# Patient Record
Sex: Female | Born: 1960
Health system: Southern US, Community
[De-identification: ages and names within clinical notes are randomized; demographics above are authoritative.]

## PROBLEM LIST (undated history)

## (undated) DIAGNOSIS — F3181 Bipolar II disorder: Secondary | ICD-10-CM

## (undated) DIAGNOSIS — K219 Gastro-esophageal reflux disease without esophagitis: Secondary | ICD-10-CM

## (undated) DIAGNOSIS — K469 Unspecified abdominal hernia without obstruction or gangrene: Secondary | ICD-10-CM

## (undated) DIAGNOSIS — I10 Essential (primary) hypertension: Secondary | ICD-10-CM

## (undated) DIAGNOSIS — T7840XA Allergy, unspecified, initial encounter: Secondary | ICD-10-CM

## (undated) DIAGNOSIS — E119 Type 2 diabetes mellitus without complications: Secondary | ICD-10-CM

## (undated) DIAGNOSIS — H409 Unspecified glaucoma: Secondary | ICD-10-CM

## (undated) DIAGNOSIS — D649 Anemia, unspecified: Secondary | ICD-10-CM

## (undated) HISTORY — DX: Unspecified glaucoma: H40.9

## (undated) HISTORY — PX: TUBAL LIGATION: SHX77

## (undated) HISTORY — DX: Anemia, unspecified: D64.9

## (undated) HISTORY — PX: OTHER SURGICAL HISTORY: SHX169

## (undated) HISTORY — DX: Type 2 diabetes mellitus without complications: E11.9

## (undated) HISTORY — PX: HERNIA REPAIR: SHX51

## (undated) HISTORY — DX: Gastro-esophageal reflux disease without esophagitis: K21.9

## (undated) HISTORY — PX: TONSILLECTOMY: SUR1361

## (undated) HISTORY — DX: Allergy, unspecified, initial encounter: T78.40XA

---

## 1998-01-14 ENCOUNTER — Ambulatory Visit (HOSPITAL_COMMUNITY): Admission: RE | Admit: 1998-01-14 | Discharge: 1998-01-14 | Payer: Self-pay | Admitting: Obstetrics and Gynecology

## 1998-01-14 ENCOUNTER — Encounter: Payer: Self-pay | Admitting: Obstetrics and Gynecology

## 1998-03-17 ENCOUNTER — Inpatient Hospital Stay (HOSPITAL_COMMUNITY): Admission: AD | Admit: 1998-03-17 | Discharge: 1998-03-17 | Payer: Self-pay | Admitting: Obstetrics and Gynecology

## 1998-03-19 ENCOUNTER — Inpatient Hospital Stay (HOSPITAL_COMMUNITY): Admission: AD | Admit: 1998-03-19 | Discharge: 1998-03-19 | Payer: Self-pay | Admitting: Obstetrics and Gynecology

## 1998-04-04 ENCOUNTER — Inpatient Hospital Stay (HOSPITAL_COMMUNITY): Admission: AD | Admit: 1998-04-04 | Discharge: 1998-04-06 | Payer: Self-pay | Admitting: Obstetrics and Gynecology

## 1998-09-18 ENCOUNTER — Ambulatory Visit (HOSPITAL_COMMUNITY): Admission: RE | Admit: 1998-09-18 | Discharge: 1998-09-18 | Payer: Self-pay | Admitting: Family Medicine

## 1998-09-18 ENCOUNTER — Encounter: Payer: Self-pay | Admitting: Family Medicine

## 1998-12-11 ENCOUNTER — Inpatient Hospital Stay (HOSPITAL_COMMUNITY): Admission: RE | Admit: 1998-12-11 | Discharge: 1998-12-14 | Payer: Self-pay | Admitting: Specialist

## 2000-11-07 ENCOUNTER — Encounter: Payer: Self-pay | Admitting: Emergency Medicine

## 2000-11-07 ENCOUNTER — Emergency Department (HOSPITAL_COMMUNITY): Admission: EM | Admit: 2000-11-07 | Discharge: 2000-11-07 | Payer: Self-pay | Admitting: Emergency Medicine

## 2003-07-04 ENCOUNTER — Emergency Department (HOSPITAL_COMMUNITY): Admission: EM | Admit: 2003-07-04 | Discharge: 2003-07-04 | Payer: Self-pay

## 2003-12-02 ENCOUNTER — Emergency Department (HOSPITAL_COMMUNITY): Admission: EM | Admit: 2003-12-02 | Discharge: 2003-12-02 | Payer: Self-pay | Admitting: Emergency Medicine

## 2003-12-03 ENCOUNTER — Ambulatory Visit: Payer: Self-pay | Admitting: Nurse Practitioner

## 2003-12-15 ENCOUNTER — Ambulatory Visit: Payer: Self-pay | Admitting: Nurse Practitioner

## 2003-12-30 ENCOUNTER — Ambulatory Visit: Payer: Self-pay | Admitting: *Deleted

## 2004-01-08 ENCOUNTER — Ambulatory Visit: Payer: Self-pay | Admitting: Internal Medicine

## 2004-08-24 ENCOUNTER — Ambulatory Visit: Payer: Self-pay | Admitting: Internal Medicine

## 2005-12-22 ENCOUNTER — Ambulatory Visit: Payer: Self-pay | Admitting: Internal Medicine

## 2006-01-06 ENCOUNTER — Encounter (INDEPENDENT_AMBULATORY_CARE_PROVIDER_SITE_OTHER): Payer: Self-pay | Admitting: Specialist

## 2006-01-06 ENCOUNTER — Ambulatory Visit: Payer: Self-pay | Admitting: Internal Medicine

## 2006-01-06 DIAGNOSIS — D509 Iron deficiency anemia, unspecified: Secondary | ICD-10-CM | POA: Insufficient documentation

## 2006-02-07 ENCOUNTER — Ambulatory Visit: Payer: Self-pay | Admitting: Internal Medicine

## 2006-05-05 ENCOUNTER — Ambulatory Visit: Payer: Self-pay | Admitting: Internal Medicine

## 2006-11-09 ENCOUNTER — Telehealth (INDEPENDENT_AMBULATORY_CARE_PROVIDER_SITE_OTHER): Payer: Self-pay | Admitting: Internal Medicine

## 2006-11-22 ENCOUNTER — Encounter (INDEPENDENT_AMBULATORY_CARE_PROVIDER_SITE_OTHER): Payer: Self-pay | Admitting: *Deleted

## 2006-12-07 ENCOUNTER — Ambulatory Visit: Payer: Self-pay | Admitting: Internal Medicine

## 2006-12-07 DIAGNOSIS — J3089 Other allergic rhinitis: Secondary | ICD-10-CM | POA: Insufficient documentation

## 2006-12-07 DIAGNOSIS — M1711 Unilateral primary osteoarthritis, right knee: Secondary | ICD-10-CM | POA: Insufficient documentation

## 2006-12-07 DIAGNOSIS — E7800 Pure hypercholesterolemia, unspecified: Secondary | ICD-10-CM | POA: Insufficient documentation

## 2006-12-07 DIAGNOSIS — I10 Essential (primary) hypertension: Secondary | ICD-10-CM | POA: Insufficient documentation

## 2006-12-08 ENCOUNTER — Ambulatory Visit (HOSPITAL_COMMUNITY): Admission: RE | Admit: 2006-12-08 | Discharge: 2006-12-08 | Payer: Self-pay | Admitting: Internal Medicine

## 2006-12-14 ENCOUNTER — Telehealth (INDEPENDENT_AMBULATORY_CARE_PROVIDER_SITE_OTHER): Payer: Self-pay | Admitting: Internal Medicine

## 2006-12-19 ENCOUNTER — Telehealth (INDEPENDENT_AMBULATORY_CARE_PROVIDER_SITE_OTHER): Payer: Self-pay | Admitting: Internal Medicine

## 2006-12-20 ENCOUNTER — Telehealth (INDEPENDENT_AMBULATORY_CARE_PROVIDER_SITE_OTHER): Payer: Self-pay | Admitting: Internal Medicine

## 2007-01-03 ENCOUNTER — Telehealth (INDEPENDENT_AMBULATORY_CARE_PROVIDER_SITE_OTHER): Payer: Self-pay | Admitting: Internal Medicine

## 2007-01-11 ENCOUNTER — Telehealth (INDEPENDENT_AMBULATORY_CARE_PROVIDER_SITE_OTHER): Payer: Self-pay | Admitting: Internal Medicine

## 2007-01-12 ENCOUNTER — Encounter (INDEPENDENT_AMBULATORY_CARE_PROVIDER_SITE_OTHER): Payer: Self-pay | Admitting: Internal Medicine

## 2007-01-30 ENCOUNTER — Ambulatory Visit: Payer: Self-pay | Admitting: Internal Medicine

## 2007-01-30 ENCOUNTER — Encounter: Admission: RE | Admit: 2007-01-30 | Discharge: 2007-03-07 | Payer: Self-pay | Admitting: Internal Medicine

## 2007-01-30 DIAGNOSIS — K219 Gastro-esophageal reflux disease without esophagitis: Secondary | ICD-10-CM | POA: Insufficient documentation

## 2007-02-14 ENCOUNTER — Telehealth (INDEPENDENT_AMBULATORY_CARE_PROVIDER_SITE_OTHER): Payer: Self-pay | Admitting: Internal Medicine

## 2007-02-15 ENCOUNTER — Emergency Department (HOSPITAL_COMMUNITY): Admission: EM | Admit: 2007-02-15 | Discharge: 2007-02-15 | Payer: Self-pay | Admitting: Emergency Medicine

## 2007-03-07 ENCOUNTER — Encounter (INDEPENDENT_AMBULATORY_CARE_PROVIDER_SITE_OTHER): Payer: Self-pay | Admitting: Internal Medicine

## 2007-03-17 ENCOUNTER — Encounter (INDEPENDENT_AMBULATORY_CARE_PROVIDER_SITE_OTHER): Payer: Self-pay | Admitting: Internal Medicine

## 2007-03-28 ENCOUNTER — Telehealth (INDEPENDENT_AMBULATORY_CARE_PROVIDER_SITE_OTHER): Payer: Self-pay | Admitting: Internal Medicine

## 2007-04-23 ENCOUNTER — Ambulatory Visit: Payer: Self-pay | Admitting: Nurse Practitioner

## 2007-04-23 DIAGNOSIS — S9030XA Contusion of unspecified foot, initial encounter: Secondary | ICD-10-CM | POA: Insufficient documentation

## 2007-05-14 ENCOUNTER — Telehealth (INDEPENDENT_AMBULATORY_CARE_PROVIDER_SITE_OTHER): Payer: Self-pay | Admitting: *Deleted

## 2007-06-11 ENCOUNTER — Encounter (INDEPENDENT_AMBULATORY_CARE_PROVIDER_SITE_OTHER): Payer: Self-pay | Admitting: Internal Medicine

## 2007-07-02 ENCOUNTER — Telehealth (INDEPENDENT_AMBULATORY_CARE_PROVIDER_SITE_OTHER): Payer: Self-pay | Admitting: *Deleted

## 2007-07-03 ENCOUNTER — Ambulatory Visit: Payer: Self-pay | Admitting: Internal Medicine

## 2007-07-03 DIAGNOSIS — R609 Edema, unspecified: Secondary | ICD-10-CM | POA: Insufficient documentation

## 2007-07-11 ENCOUNTER — Telehealth (INDEPENDENT_AMBULATORY_CARE_PROVIDER_SITE_OTHER): Payer: Self-pay | Admitting: Internal Medicine

## 2007-08-07 ENCOUNTER — Telehealth (INDEPENDENT_AMBULATORY_CARE_PROVIDER_SITE_OTHER): Payer: Self-pay | Admitting: Internal Medicine

## 2007-08-17 ENCOUNTER — Telehealth (INDEPENDENT_AMBULATORY_CARE_PROVIDER_SITE_OTHER): Payer: Self-pay | Admitting: Internal Medicine

## 2007-08-17 ENCOUNTER — Emergency Department (HOSPITAL_BASED_OUTPATIENT_CLINIC_OR_DEPARTMENT_OTHER): Admission: EM | Admit: 2007-08-17 | Discharge: 2007-08-17 | Payer: Self-pay | Admitting: Emergency Medicine

## 2007-08-24 ENCOUNTER — Ambulatory Visit: Payer: Self-pay | Admitting: Internal Medicine

## 2007-08-24 DIAGNOSIS — E876 Hypokalemia: Secondary | ICD-10-CM | POA: Insufficient documentation

## 2007-08-24 LAB — CONVERTED CEMR LAB
Blood Glucose, Fingerstick: 195
Hgb A1c MFr Bld: 8.9 %

## 2007-08-27 ENCOUNTER — Telehealth (INDEPENDENT_AMBULATORY_CARE_PROVIDER_SITE_OTHER): Payer: Self-pay | Admitting: Internal Medicine

## 2007-09-06 ENCOUNTER — Ambulatory Visit: Payer: Self-pay | Admitting: Internal Medicine

## 2007-09-06 LAB — CONVERTED CEMR LAB
BUN: 15 mg/dL (ref 6–23)
CO2: 24 meq/L (ref 19–32)
Calcium: 8.9 mg/dL (ref 8.4–10.5)
Chloride: 101 meq/L (ref 96–112)
Creatinine, Ser: 0.91 mg/dL (ref 0.40–1.20)
Glucose, Bld: 138 mg/dL — ABNORMAL HIGH (ref 70–99)
Potassium: 3.7 meq/L (ref 3.5–5.3)
Sodium: 137 meq/L (ref 135–145)

## 2007-09-12 ENCOUNTER — Encounter (INDEPENDENT_AMBULATORY_CARE_PROVIDER_SITE_OTHER): Payer: Self-pay | Admitting: Internal Medicine

## 2007-09-16 ENCOUNTER — Emergency Department (HOSPITAL_BASED_OUTPATIENT_CLINIC_OR_DEPARTMENT_OTHER): Admission: EM | Admit: 2007-09-16 | Discharge: 2007-09-16 | Payer: Self-pay | Admitting: Emergency Medicine

## 2007-09-17 ENCOUNTER — Telehealth (INDEPENDENT_AMBULATORY_CARE_PROVIDER_SITE_OTHER): Payer: Self-pay | Admitting: Internal Medicine

## 2007-09-30 ENCOUNTER — Telehealth (INDEPENDENT_AMBULATORY_CARE_PROVIDER_SITE_OTHER): Payer: Self-pay | Admitting: Internal Medicine

## 2007-10-01 ENCOUNTER — Encounter (INDEPENDENT_AMBULATORY_CARE_PROVIDER_SITE_OTHER): Payer: Self-pay | Admitting: Internal Medicine

## 2007-10-02 ENCOUNTER — Ambulatory Visit: Payer: Self-pay | Admitting: Internal Medicine

## 2007-10-05 ENCOUNTER — Ambulatory Visit: Payer: Self-pay | Admitting: Internal Medicine

## 2007-10-05 DIAGNOSIS — N898 Other specified noninflammatory disorders of vagina: Secondary | ICD-10-CM | POA: Insufficient documentation

## 2007-10-05 LAB — CONVERTED CEMR LAB
BUN: 13 mg/dL (ref 6–23)
Blood Glucose, Fingerstick: 131
CO2: 24 meq/L (ref 19–32)
Calcium: 9.2 mg/dL (ref 8.4–10.5)
Chlamydia, Swab/Urine, PCR: NEGATIVE
Chloride: 102 meq/L (ref 96–112)
Creatinine, Ser: 1.04 mg/dL (ref 0.40–1.20)
GC Probe Amp, Urine: NEGATIVE
Glucose, Bld: 88 mg/dL (ref 70–99)
KOH Prep: NEGATIVE
Potassium: 4.1 meq/L (ref 3.5–5.3)
Sodium: 136 meq/L (ref 135–145)
Whiff Test: POSITIVE

## 2007-10-13 ENCOUNTER — Encounter (INDEPENDENT_AMBULATORY_CARE_PROVIDER_SITE_OTHER): Payer: Self-pay | Admitting: Internal Medicine

## 2007-10-19 ENCOUNTER — Emergency Department (HOSPITAL_COMMUNITY): Admission: EM | Admit: 2007-10-19 | Discharge: 2007-10-19 | Payer: Self-pay | Admitting: Emergency Medicine

## 2007-10-24 ENCOUNTER — Telehealth (INDEPENDENT_AMBULATORY_CARE_PROVIDER_SITE_OTHER): Payer: Self-pay | Admitting: Internal Medicine

## 2007-12-19 ENCOUNTER — Ambulatory Visit: Payer: Self-pay | Admitting: Internal Medicine

## 2008-02-20 ENCOUNTER — Telehealth (INDEPENDENT_AMBULATORY_CARE_PROVIDER_SITE_OTHER): Payer: Self-pay | Admitting: Internal Medicine

## 2008-03-18 ENCOUNTER — Ambulatory Visit: Payer: Self-pay | Admitting: Internal Medicine

## 2008-03-18 DIAGNOSIS — F319 Bipolar disorder, unspecified: Secondary | ICD-10-CM | POA: Insufficient documentation

## 2008-03-18 LAB — CONVERTED CEMR LAB
Bilirubin Urine: NEGATIVE
Blood Glucose, Fingerstick: 118
Blood in Urine, dipstick: NEGATIVE
Glucose, Urine, Semiquant: NEGATIVE
Hgb A1c MFr Bld: 6.7 %
KOH Prep: NEGATIVE
Ketones, urine, test strip: NEGATIVE
Nitrite: NEGATIVE
Protein, U semiquant: NEGATIVE
Specific Gravity, Urine: 1.01
Urobilinogen, UA: 0.2
Whiff Test: NEGATIVE
pH: 7.5

## 2008-03-25 ENCOUNTER — Encounter (INDEPENDENT_AMBULATORY_CARE_PROVIDER_SITE_OTHER): Payer: Self-pay | Admitting: Internal Medicine

## 2008-03-26 ENCOUNTER — Encounter (INDEPENDENT_AMBULATORY_CARE_PROVIDER_SITE_OTHER): Payer: Self-pay | Admitting: Internal Medicine

## 2008-04-04 ENCOUNTER — Encounter (INDEPENDENT_AMBULATORY_CARE_PROVIDER_SITE_OTHER): Payer: Self-pay | Admitting: *Deleted

## 2008-04-15 ENCOUNTER — Telehealth (INDEPENDENT_AMBULATORY_CARE_PROVIDER_SITE_OTHER): Payer: Self-pay | Admitting: Internal Medicine

## 2008-04-15 LAB — CONVERTED CEMR LAB
ALT: 10 units/L (ref 0–35)
AST: 11 units/L (ref 0–37)
Albumin: 3.8 g/dL (ref 3.5–5.2)
Alkaline Phosphatase: 76 units/L (ref 39–117)
BUN: 12 mg/dL (ref 6–23)
Basophils Absolute: 0 10*3/uL (ref 0.0–0.1)
Basophils Relative: 0 % (ref 0–1)
CO2: 24 meq/L (ref 19–32)
Calcium: 8.9 mg/dL (ref 8.4–10.5)
Chloride: 103 meq/L (ref 96–112)
Cholesterol: 214 mg/dL — ABNORMAL HIGH (ref 0–200)
Creatinine, Ser: 0.98 mg/dL (ref 0.40–1.20)
Eosinophils Absolute: 0.3 10*3/uL (ref 0.0–0.7)
Eosinophils Relative: 3 % (ref 0–5)
Glucose, Bld: 95 mg/dL (ref 70–99)
HCT: 35.8 % — ABNORMAL LOW (ref 36.0–46.0)
HDL: 39 mg/dL — ABNORMAL LOW (ref >39)
Hemoglobin: 11.9 g/dL — ABNORMAL LOW (ref 12.0–15.0)
LDL Cholesterol: 143 mg/dL — ABNORMAL HIGH (ref 0–99)
Lymphocytes Relative: 35 % (ref 12–46)
Lymphs Abs: 3.7 10*3/uL (ref 0.7–4.0)
MCHC: 33.2 g/dL (ref 30.0–36.0)
MCV: 82.3 fL (ref 78.0–100.0)
Microalb, Ur: 0.25 mg/dL (ref 0.00–1.89)
Monocytes Absolute: 0.6 10*3/uL (ref 0.1–1.0)
Monocytes Relative: 6 % (ref 3–12)
Neutro Abs: 6 10*3/uL (ref 1.7–7.7)
Neutrophils Relative %: 57 % (ref 43–77)
Platelets: 389 10*3/uL (ref 150–400)
Potassium: 4.1 meq/L (ref 3.5–5.3)
RBC: 4.35 M/uL (ref 3.87–5.11)
RDW: 15.8 % — ABNORMAL HIGH (ref 11.5–15.5)
Sodium: 141 meq/L (ref 135–145)
Total Bilirubin: 0.3 mg/dL (ref 0.3–1.2)
Total CHOL/HDL Ratio: 5.5
Total Protein: 7.6 g/dL (ref 6.0–8.3)
Triglycerides: 162 mg/dL — ABNORMAL HIGH (ref <150)
VLDL: 32 mg/dL (ref 0–40)
WBC: 10.6 10*3/uL — ABNORMAL HIGH (ref 4.0–10.5)

## 2008-07-15 ENCOUNTER — Encounter (INDEPENDENT_AMBULATORY_CARE_PROVIDER_SITE_OTHER): Payer: Self-pay | Admitting: Internal Medicine

## 2008-07-21 ENCOUNTER — Encounter (INDEPENDENT_AMBULATORY_CARE_PROVIDER_SITE_OTHER): Payer: Self-pay | Admitting: Internal Medicine

## 2008-07-30 ENCOUNTER — Encounter (INDEPENDENT_AMBULATORY_CARE_PROVIDER_SITE_OTHER): Payer: Self-pay | Admitting: Internal Medicine

## 2008-08-07 ENCOUNTER — Encounter (INDEPENDENT_AMBULATORY_CARE_PROVIDER_SITE_OTHER): Payer: Self-pay | Admitting: Internal Medicine

## 2008-09-03 ENCOUNTER — Emergency Department (HOSPITAL_COMMUNITY): Admission: EM | Admit: 2008-09-03 | Discharge: 2008-09-03 | Payer: Self-pay | Admitting: Emergency Medicine

## 2008-09-23 ENCOUNTER — Encounter: Admission: RE | Admit: 2008-09-23 | Discharge: 2008-09-23 | Payer: Self-pay | Admitting: Family Medicine

## 2008-09-30 ENCOUNTER — Encounter (INDEPENDENT_AMBULATORY_CARE_PROVIDER_SITE_OTHER): Payer: Self-pay | Admitting: Internal Medicine

## 2008-10-14 ENCOUNTER — Encounter (INDEPENDENT_AMBULATORY_CARE_PROVIDER_SITE_OTHER): Payer: Self-pay | Admitting: *Deleted

## 2009-01-21 ENCOUNTER — Telehealth: Payer: Self-pay | Admitting: Physician Assistant

## 2009-01-26 ENCOUNTER — Ambulatory Visit (HOSPITAL_COMMUNITY): Admission: RE | Admit: 2009-01-26 | Discharge: 2009-01-26 | Payer: Self-pay | Admitting: Nephrology

## 2009-02-06 ENCOUNTER — Encounter (INDEPENDENT_AMBULATORY_CARE_PROVIDER_SITE_OTHER): Payer: Self-pay | Admitting: *Deleted

## 2009-04-17 ENCOUNTER — Telehealth: Payer: Self-pay | Admitting: Physician Assistant

## 2009-04-17 ENCOUNTER — Encounter (INDEPENDENT_AMBULATORY_CARE_PROVIDER_SITE_OTHER): Payer: Self-pay | Admitting: *Deleted

## 2009-08-05 ENCOUNTER — Telehealth (INDEPENDENT_AMBULATORY_CARE_PROVIDER_SITE_OTHER): Payer: Self-pay | Admitting: Internal Medicine

## 2009-08-11 ENCOUNTER — Telehealth: Payer: Self-pay | Admitting: Physician Assistant

## 2009-12-17 ENCOUNTER — Encounter: Admission: RE | Admit: 2009-12-17 | Discharge: 2009-12-17 | Payer: Self-pay | Admitting: Family Medicine

## 2010-01-04 ENCOUNTER — Telehealth (INDEPENDENT_AMBULATORY_CARE_PROVIDER_SITE_OTHER): Payer: Self-pay | Admitting: Internal Medicine

## 2010-02-19 ENCOUNTER — Emergency Department (HOSPITAL_COMMUNITY)
Admission: EM | Admit: 2010-02-19 | Discharge: 2010-02-19 | Payer: Self-pay | Source: Home / Self Care | Admitting: Family Medicine

## 2010-03-26 ENCOUNTER — Emergency Department (HOSPITAL_COMMUNITY)
Admission: EM | Admit: 2010-03-26 | Discharge: 2010-03-26 | Payer: Self-pay | Source: Home / Self Care | Admitting: Emergency Medicine

## 2010-04-06 NOTE — Progress Notes (Signed)
Summary: med change  Medications Added OMEPRAZOLE 20 MG CPDR (OMEPRAZOLE) one tablet by mouth two times a day before meals **Pharmacy - d/c protonix**       Phone Note Call from Patient Call back at Home Phone [phone]   Caller: Patient Call For: [phone] Summary of Call: The pt is out from her protonic (acid reflux) and also she needs her lab results.  [facility] Dr. Delrae Alfred Initial call taken by: Manon Hilding,  April 15, 2008 2:17 PM  Follow-up for Phone Call        Medicare not covering Protonix and it needs to be changed.  She will try to find her formulary and bring it in to see what is covered. Follow-up by: Vesta Mixer CMA,  April 15, 2008 2:21 PM  Additional Follow-up for Phone Call Additional follow up Details #1::        will change to omeprazole by mouth two times a day. i don't see that pt has had this in the past and usually she need a trial on it before medicaid will cover anything else rx sent electronially to pharmacy Additional Follow-up by: Lehman Prom FNP,  April 16, 2008 5:54 PM    Additional Follow-up for Phone Call Additional follow up Details #2::    Pt aware she will try omeprazole and let us know if it works or not. Follow-up by: Vesta Mixer CMA,  April 17, 2008 10:25 AM  New/Updated Medications: OMEPRAZOLE 20 MG CPDR (OMEPRAZOLE) one tablet by mouth two times a day before meals **Pharmacy - d/c protonix**   Prescriptions: OMEPRAZOLE 20 MG CPDR (OMEPRAZOLE) one tablet by mouth two times a day before meals **Pharmacy - d/c protonix**  #60 x 5   Entered and Authorized by:   Lehman Prom FNP   Signed by:   Lehman Prom FNP on 04/16/2008   Method used:   [facility]        [facility]  [address] [phone]* (retail)       [address]       [place], [place]  [address]       Ph: [phone] or [phone]       Fax: [phone]   RxID:   [id number]

## 2010-04-06 NOTE — Letter (Signed)
Summary: REQUESTING RECORDS FROM [facility]  REQUESTING RECORDS FROM [facility]   Imported By: Arta Bruce 03/26/2008 09:26:05  _____________________________________________________________________  External Attachment:    Type:   Image     Comment:   External Document

## 2010-04-06 NOTE — Letter (Signed)
Summary: *HSN Results Follow up  [facility]  [address] [place], [place] [address]   Phone: [phone]  Fax: [phone]      02/06/2009   [facility] [address] [place], [place]  [address]   Dear  Ms. Shalin Brammer,                            ____S.Drinkard,FNP   ____D. Gore,FNP       ____B. McPherson,MD   ____V. Rankins,MD    ____E. Mulberry,MD    ____N. Daphine Deutscher, FNP  ____D. Reche Dixon, MD    ____K. Philipp Deputy, MD    _X___Other     This letter is to inform you that your recent test(s):  _______Pap Smear    _______Lab Test     _______X-ray    _______ is within acceptable limits  _______ requires a medication change  _______ requires a follow-up lab visit  ____X___ requires a follow-up visit with your provider   Comments: We have been trying to contact you since we recieved a request from the pharmacy about a prescription you have not taken in a while.  Can you please call the office so we can schedule you an appointment.       _________________________________________________________ If you have any questions, please contact our office                     Sincerely,  [place] Shannon [facility]

## 2010-04-06 NOTE — Progress Notes (Signed)
Summary: SWELLING IN FEET AND ANKLES   Phone Note Call from Patient Call back at Home Phone [phone]   Summary of Call: Melissa Chavez PT. SHE WENT TO THE [facility], BECAUSE SHE IS HAVING SOME SWELLING IN HER ANKLES AND FEET, AND THEY ALSO CHECKED HER BLOOD SUGARS AND THEY WERE 232. SHE SAID IT PROBABLY CAME FROM HER EATING A BIG BREAKFAST WITH A 32oz GLASS OF TEA. Initial call taken by: Leodis Rains,  August 17, 2007 2:11 PM  Follow-up for Phone Call        Left message on answering machine for pt to return call  Follow-up by: Vesta Mixer CMA,  August 17, 2007 5:04 PM  Additional Follow-up for Phone Call Additional follow up Details #1::        left message on answer machine for pt. to return call  Additional Follow-up by: Gaylyn Cheers RN,  August 21, 2007 12:11 PM    Additional Follow-up for Phone Call Additional follow up Details #2::    Pt. states she does not want to continue her care here. She plans to call the referral department @ [facility] to find a new doctor. Offered to schedule an appt. here, she says she always has to wait so long to get an appointment and then has to wait a long time to be seen once she is here.  If she changes her mind she needs to call and schedule an appt. Encouraged her to see a doctor soon, since her blood sugar was high. She understands it is important.  Will notify Dr. Delrae Alfred Follow-up by: Gaylyn Cheers RN,  August 21, 2007 12:31 PM

## 2010-04-06 NOTE — Miscellaneous (Signed)
   Clinical Lists Changes  Observations: Added new observation of MAMMOGRAM: normal (01/22/2007 14:38)

## 2010-04-06 NOTE — Progress Notes (Signed)
Summary: X-RAY RESULTS  Phone Note Call from Patient Call back at [phone]   Summary of Call: MULBERRY PT. CALLING TO GET HER X-RAY RESULTS FROM LAST WEEK. Initial call taken by: Leodis Rains,  December 14, 2006 2:36 PM  Follow-up for Phone Call        Attempted to call pt. with results and recommendations--no answer.  Recommend the Aleve and PT as we discussed.  She has significant arthritis of that knee.  Make sure she has follow up with me to see if any improvement with PT Follow-up by: Julieanne Manson MD,  December 19, 2006 6:15 PM  Additional Follow-up for Phone Call Additional follow up Details #1::        pt aware of results has not seen pt yet will check to see why she has not heard from them Additional Follow-up by: Vesta Mixer CMA,  December 20, 2006 12:37 PM

## 2010-04-06 NOTE — Progress Notes (Signed)
   Phone Note Other Incoming   Summary of Call: Refill request from pharmacy for Paroxetine. Request states not filled since 5.3.2010. Please contact patient and find out what she has been doing with paxil. If not taken in a while, needs f/u visit. Initial call taken by: Tereso Newcomer PA-C,  January 21, 2009 8:38 AM  Follow-up for Phone Call        number is disconnected....will mail letter.Marland KitchenMarland KitchenMarland KitchenArmenia Chavez  February 06, 2009 10:03 AM   unable to recieve calls at this time.................Marland KitchenArmenia Chavez  February 09, 2009 12:19 PM

## 2010-04-06 NOTE — Assessment & Plan Note (Signed)
Summary: RIGHT KNEE PAIN///KT   Vital Signs:  Patient Profile:   50 Years Old Female LMP:     12/01/2006 Height:     62 inches Weight:      240 pounds BMI:     44.06 Temp:     97.6 degrees F Pulse rate:   72 / minute Pulse rhythm:   regular Resp:     20 per minute BP sitting:   120 / 82  (left arm) Cuff size:   large  Pt. in pain?   yes    Location:   knee    Intensity:   3  Vitals Entered By: Vesta Mixer CMA (December 07, 2006 10:15 AM)  Menstrual History: LMP (date): 12/01/2006                  Chief Complaint:  bilateral knee pain off and on and right worse than left.  History of Present Illness: Right knee with pain to point cannot even move at times.  Has had problems on and off for several months.  Discomfort lasts generally 3-5 days.  Feels like "pins sticking in knee"--points to medial joint and along joint margin.  Aches at times as well.  Feels swollen at times. No definite erythema.  Worse if tries to go up or down stairs.  No definite regular stiffness.  One year ago fell and landed on anterior knees--landed on cement surface.  Occasionally has left knee discomfort.  Has tried Aleve 2 tabs two times a day on occasion with some help.        Physical Exam  Msk:     Essentially full ROM, right knee.  Mild tenderness over medial joint margin/right collateral ligament.  No pain on lateral collateral strain, but mildly so with medial collateral strain.  No patellar discomfort on palpation.  No crepitation.  No ligamentous laxity.  Pt. is morbidly obese.    Impression & Recommendations:  Problem # 1:  KNEE PAIN, RIGHT (ICD-719.46) Suspect right collateral ligament strain. Referral to PT right knee film follow up in 2 months Pool exercises if available to pt.  Orders: Physical Therapy Referral (PT) Diagnostic X-Ray/Fluoroscopy (Diagnostic X-Ray/Flu)   Complete Medication List: 1)  Hydralazine-hctz 25-25 Mg Caps (Hydralazine-hctz) 2)  Advair  Diskus 100-50 Mcg/dose Misc (Fluticasone-salmeterol) 3)  Zyrtec Allergy 10 Mg Tabs (Cetirizine hcl) 4)  Singulair 10 Mg Tabs (Montelukast sodium) 5)  Paxil 10 Mg Tabs (Paroxetine hcl) 6)  Albuterol Mdi    Patient Instructions: 1)  Physical therapy should be calling 2)  Get Xray in next week please. 3)  Call for follow up appt in 2 months. 4)  May continue Alever 2 tabs two times a day as needed.  Take with food.    ]

## 2010-04-06 NOTE — Progress Notes (Signed)
Summary: FEET AND ANKLES SWELLING   Phone Note Call from Patient Call back at [phone]   Summary of Call: Melissa Chavez PT. SAYS HER FEET AND ANKLES ARE SWELLING AND STARTED YESTERDAY AND BEEN HAVING HEADACHES. Initial call taken by: Leodis Rains,  February 14, 2007 8:21 AM  Follow-up for Phone Call        Left message on answering machine for pt to return call  Follow-up by: Kriste Basque.,  February 14, 2007 4:31 PM  Additional Follow-up for Phone Call Additional follow up Details #1::        letter mailed for pt to call if still needs [place] Additional Follow-up by: Vesta Mixer CMA,  February 28, 2007 10:54 AM

## 2010-04-06 NOTE — Assessment & Plan Note (Signed)
Summary: LST SEEN JULY 09///KT   Vital Signs:  Patient Profile:   50 Years Old Female Height:     62 inches Weight:      245 pounds BMI:     44.97 Temp:     97.5 degrees F Pulse rate:   84 / minute Pulse rhythm:   regular Resp:     20 per minute BP sitting:   130 / 80  (left arm) Cuff size:   large  Pt. in pain?   no  Vitals Entered By: Vesta Mixer CMA (March 18, 2008 8:41 AM)              Is Patient Diabetic? Yes CBG Result 118  Does patient need assistance? Ambulation Normal     Chief Complaint:  diabetes Management not seen since last july and c/o still having some vag d/c since last visit.  History of Present Illness: 1.  NIDDM:  A1C of 6.7% supports good control.  Sugars fasting generally run in 120 range.  Checks 3-4 times weekly.  Trying to be more active during her regular day.    2.  Hypertension:  BP not at goal, but close.  States she's taking meds regularly.  Not using Lasix and potassium as she has not had swelling of ankles since cold weather hit.  3.  GERD:  controlled on Protonix.  4.  Bipolar Disorder:  Has not had any manic episodes in some time--more than 15 years ago, despite being on just an antidepressant.  On disability for this.  Not seeing a psychiatrist-really doesn't want to go to [facility]--did not feel she received good care.  Would be willing to see Aquilla Solian and see what her options are for getting back to a psychiatrist.  Feels depression is well controlled on Paroxetine.  5.  Asthma and allergies:  well controlled on Singulair, Loratadine, and Advair.  6.  Vaginal discharge:  Has had for 2 months at least.  Did notice improvement after treated in summer for Trichomonas.  Does have a yellow to clear discharge with an odor at times.  Not sexually active since July before treated for trichomonas.  7.  Health Maintenance:  Has not had mammogram this year--would like to go through scholarship program at [facility].  Worked at  [employer] and does not want to go there.    Prior Medications Reviewed Using: Medication Bottles  Updated Prior Medication List: No bottle for naprosyn, lasix, klor con, metronidazole Current Allergies: No known allergies       Physical Exam  General:     obese, nad Lungs:     Normal respiratory effort, chest expands symmetrically. Lungs are clear to auscultation, no crackles or wheezes. Heart:     Normal rate and regular rhythm. S1 and S2 normal without gallop, murmur, click, rub or other extra sounds.  Radial pulses normal and equal. Extremities:     no edema  Diabetes Management Exam:    Foot Exam (with socks and/or shoes not present):       Sensory-Monofilament:          Left foot: normal          Right foot: normal    Impression & Recommendations:  Problem # 1:  BIPOLAR DISORDER UNSPECIFIED (ICD-296.80)  Orders: Psychology Referral (Psychology) Aquilla Solian to help placement.  Orders: Psychology Referral (Psychology)   Problem # 2:  VAGINAL DISCHARGE (ICD-623.5) Bacterial vaginosis Orders: KOH/ WET Mount [phone])   Problem #  3:  NEW ONSET DIABETES MELLITUS, TYPE II, UNCONTROLLED (ICD-250.02) Controlled Check labs Her updated medication list for this problem includes:    Fosinopril Sodium 20 Mg Tabs (Fosinopril sodium) .Marland Kitchen... 1 tab by mouth daily    Glucotrol Xl 5 Mg Tb24 (Glipizide) .Marland Kitchen... 1 tab by mouth q a.m. with breakfast.    Metformin Hcl 500 Mg Tabs (Metformin hcl) .Marland Kitchen... 1 tab by mouth two times a day with meals  Orders: T-Urine Microalbumin w/creat. ratio (16[address] / [address]) UA Dipstick w/o Micro (manual) (19147) Hemoglobin A1C (83036)   Problem # 4:  ASTHMA (ICD-493.90) Controlled Her updated medication list for this problem includes:    Singulair 10 Mg Tabs (Montelukast sodium) .Marland Kitchen... 1 tab by mouth daily    Albuterol 90 Mcg/act Aers (Albuterol) .Marland Kitchen... 2 puffs q 4h prn    Advair Diskus 250-50 Mcg/dose Misc (Fluticasone-salmeterol)  .Marland Kitchen... 1 inhalation two times a day   Problem # 5:  HYPERCHOLESTEROLEMIA (ICD-272.0)  Orders: T-Lipid Profile (82956-21308)   Problem # 6:  HYPERTENSION (ICD-401.9) Fair control To work on diet and exercise for weight loss Her updated medication list for this problem includes:    Fosinopril Sodium 20 Mg Tabs (Fosinopril sodium) .Marland Kitchen... 1 tab by mouth daily    Dyazide 37.5-25 Mg Caps (Triamterene-hctz) .Marland Kitchen... 1 tab by mouth daily in a.m.    Furosemide 20 Mg Tabs (Furosemide) .Marland Kitchen... 1 tab po daily   Problem # 7:  ANEMIA, IRON DEFICIENCY (ICD-280.9)  Her updated medication list for this problem includes:    Ferrous Sulfate 325 (65 Fe) Mg Tabs (Ferrous sulfate) .Marland Kitchen... 1 tab by mouth daily  Orders: T-CBC w/Diff (65784-69629)   Complete Medication List: 1)  Singulair 10 Mg Tabs (Montelukast sodium) .Marland Kitchen.. 1 tab by mouth daily 2)  Paxil 10 Mg Tabs (Paroxetine hcl) .Marland Kitchen.. 1 tab by mouth daily 3)  Ferrous Sulfate 325 (65 Fe) Mg Tabs (Ferrous sulfate) .Marland Kitchen.. 1 tab by mouth daily 4)  Protonix 40 Mg Tbec (Pantoprazole sodium) .Marland Kitchen.. 1 tab by mouth daily 5)  Albuterol 90 Mcg/act Aers (Albuterol) .... 2 puffs q 4h prn 6)  Advair Diskus 250-50 Mcg/dose Misc (Fluticasone-salmeterol) .Marland Kitchen.. 1 inhalation two times a day 7)  Naprosyn 500 Mg Tabs (Naproxen) .Marland Kitchen.. 1 tablet by mouth two times a day as needed 8)  Fosinopril Sodium 20 Mg Tabs (Fosinopril sodium) .Marland Kitchen.. 1 tab by mouth daily 9)  Dyazide 37.5-25 Mg Caps (Triamterene-hctz) .Marland Kitchen.. 1 tab by mouth daily in a.m. 10)  Glucotrol Xl 5 Mg Tb24 (Glipizide) .Marland Kitchen.. 1 tab by mouth q a.m. with breakfast. 11)  Metformin Hcl 500 Mg Tabs (Metformin hcl) .Marland Kitchen.. 1 tab by mouth two times a day with meals 12)  Glucometer Elite Classic Kit (Blood glucose monitoring suppl) .... Test sugars two times a day 13)  Glucometer Elite Test Strp (Glucose blood) .... Two times a day sugar checks 14)  Furosemide 20 Mg Tabs (Furosemide) .Marland Kitchen.. 1 tab po daily 15)  Klor-con M20 20 Meq Cr-tabs  (Potassium chloride crys cr) .Marland Kitchen.. 1 tab by mouth daily 16)  Fluticasone Propionate 50 Mcg/act Susp (Fluticasone propionate) .... 2 sprays each nostril daily 17)  Loratadine 10 Mg Tabs (Loratadine) .Marland Kitchen.. 1 tab by mouth daily as needed allergies 18)  Metronidazole 500 Mg Tabs (Metronidazole) .Marland Kitchen.. 1 tab by mouth two times a day for 7 days  Other Orders: T-Comprehensive Metabolic Panel ([id number])   Patient Instructions: 1)  Release from [facility] for diabetic eye exam in past year. 2)  Referral to [facility] like to get set back up with a psychiatrist--not [facility]. 3)  CPP with Dr Delrae Alfred in 6 months   Prescriptions: METRONIDAZOLE 500 MG TABS (METRONIDAZOLE) 1 tab by mouth two times a day for 7 days  #14 x 0   Entered and Authorized by:   Julieanne Manson MD   Signed by:   Julieanne Manson MD on 03/18/2008   Method used:   [facility]  [address] [phone][facility] (retail)       [address]       [place], [place]  [address]       Ph: [phone] or [phone]       Fax: [phone]   RxID:   [id number] LORATADINE 10 MG TABS (LORATADINE) 1 tab by mouth daily as needed allergies  #30 x 11   Entered and Authorized by:   Julieanne Manson MD   Signed by:   Julieanne Manson MD on 03/18/2008   Method used:   [facility]        [facility]  [address] [phone]* (retail)       [address]       [place], [place]  [address]       Ph: [phone] or [phone]       Fax: [phone]   RxID:   [id number] METFORMIN HCL 500 MG  TABS (METFORMIN HCL) 1 tab by mouth two times a day with meals  #60.0 Each x 11   Entered and Authorized by:   Julieanne Manson MD   Signed by:   Julieanne Manson MD on 03/18/2008   Method used:   [facility]ronically to        [facility]  [address] [phone]* (retail)       [address]       [place], [place]  [address]       Ph: [phone] or [phone]       Fax: [phone]   RxID:   [id number] GLUCOTROL XL 5 MG  TB24 (GLIPIZIDE) 1 tab by mouth q a.m. with breakfast.  #30.0 Each x 11   Entered and Authorized by:   Julieanne Manson MD   Signed by:   Julieanne Manson MD on 03/18/2008   Method used:   [facility]ronically to        [facility]  [address] [phone]* (retail)       [address]       [place], [place]  [address]       Ph: [phone] or [phone]       Fax: [phone]   RxID:   [id number] DYAZIDE 37.5-25 MG  CAPS (TRIAMTERENE-HCTZ) 1 tab by mouth daily in a.m.  #30 x 11   Entered and Authorized by:   Julieanne Manson MD   Signed by:   Julieanne Manson MD on 03/18/2008   Method used:   [facility]ronically to        [facility] [address] [phone]* (retail)       [address]       [place], [place]  [address]       Ph: [phone] or [phone]  Fax: [phone]   RxID:   [id number] FOSINOPRIL SODIUM 20 MG  TABS (FOSINOPRIL SODIUM) 1 tab by mouth daily  #30 Tablet x 11   Entered and Authorized by:   Julieanne Manson MD   Signed by:   Julieanne Manson MD on 03/18/2008   Method used:   [facility]ically to        [facility] [address] [phone]* (retail)       [address]       [place], [place]  [address]       Ph: [phone] or [phone]       Fax: [phone]   RxID:   [id number] ADVAIR DISKUS 250-50 MCG/DOSE  MISC (FLUTICASONE-SALMETEROL) 1 inhalation two times a day  #1 x 11   Entered and Authorized by:   Julieanne Manson MD   Signed by:   Julieanne Manson MD on 03/18/2008   Method used:   Electronically to        [facility]  [address] [phone]* (retail)       [address]       [place], [place]  [address]       Ph: [phone] or [phone]        Fax: [phone]   RxID:   [id number] PROTONIX 40 MG  TBEC (PANTOPRAZOLE SODIUM) 1 tab by mouth daily  #30 Tablet x 11   Entered and Authorized by:   Julieanne Manson MD   Signed by:   Julieanne Manson MD on 03/18/2008   Method used:   [facility]ronically to        [facility]  [address] [phone]* (retail)       [address]       [place], [place]  [address]       Ph: [phone] or [phone]       Fax: [phone]   RxID:   [id number] PAXIL 10 MG  TABS (PAROXETINE HCL) 1 tab by mouth daily  #30 x 11   Entered and Authorized by:   Julieanne Manson MD   Signed by:   Julieanne Manson MD on 03/18/2008   Method used:   Electronically to        [facility]  [address] [phone]* (retail)       [address]       [place], [place]  [address]       Ph: [phone] or [phone]       Fax: [phone]   RxID:   [id number] SINGULAIR 10 MG  TABS (MONTELUKAST SODIUM) 1 tab by mouth daily  #30 x 11   Entered and Authorized by:   Julieanne Manson MD   Signed by:   Julieanne Manson MD on 03/18/2008   Method used:   Electronically to        [facility] [address] [phone]* (retail)       [address]       [place], [place]  [address]       Ph: [phone] or [phone]       Fax: [phone]   RxID:   [id number]   Laboratory Results   Urine Tests    Routine Urinalysis   Glucose:  negative   (Normal Range: Negative) Bilirubin: negative   (Normal Range: Negative) Ketone: negative   (Normal Range: Negative) Spec. Gravity: 1.010   (Normal Range: 1.003-1.035) Blood: negative   (Normal Range: Negative) pH: 7.5   (Normal Range: 5.0-8.0) Protein: negative   (Normal Range: Negative) Urobilinogen: 0.2   (Normal Range: 0-1) Nitrite: negative   (Normal Range: Negative) Leukocyte Esterace: small   (Normal Range: Negative)     Blood Tests       HGBA1C: 6.7%   (Normal Range: Non-Diabetic - 3-6%   Control Diabetic - 6-8%) CBG Random:: 118mg /dL    Wet Mount Source: self swab WBC/hpf: 5-10 Bacteria/hpf: 2+ Clue cells/hpf: moderate  Negative whiff Yeast/hpf: none Wet Mount KOH: Negative Trichomonas/hpf: none            Diabetic Foot Exam    10-g (5.07) Semmes-Weinstein Monofilament Test Performed by: Vesta Mixer CMA          Right Foot          Left Foot Visual Inspection               Test Control      normal         normal Site 1         normal         normal Site 2         normal         normal Site 3         normal         normal Site 4         normal         normal Site 5         normal         normal Site 6         normal         normal Site 7         normal         normal Site 8         normal         normal Site 9         normal         normal Site 10         normal         normal  Impression      normal         normal      Laboratory Results   Urine Tests    Routine Urinalysis   Glucose: negative   (Normal Range: Negative) Bilirubin: negative   (Normal Range: Negative) Ketone: negative   (Normal Range: Negative) Spec. Gravity: 1.010   (Normal Range: 1.003-1.035) Blood: negative   (Normal Range: Negative) pH: 7.5   (Normal Range: 5.0-8.0) Protein: negative   (Normal Range: Negative) Urobilinogen: 0.2   (Normal Range: 0-1) Nitrite: negative   (Normal Range: Negative) Leukocyte Esterace: small   (Normal Range: Negative)     Blood Tests     HGBA1C: 6.7%   (Normal Range: Non-Diabetic - 3-6%   Control Diabetic - 6-8%) CBG Random:: 118    Wet Mount/KOH  Negative whiff

## 2010-04-06 NOTE — Progress Notes (Signed)
   Phone Note Call from Patient Call back at [facility] Phone [phone]   Caller: Patient Call For: [phone] Summary of Call: The patient told me that she is very upset because she left this message couple times regarding her feet and leg are swelling constantly but she still have not been receividng any phone call.  This is actually the first phone note taking regarding this problem.   Dr. Delrae Alfred  Initial call taken by: Manon Hilding,  July 02, 2007 12:33 PM  Follow-up for Phone Call        spoke with Ms Stokes regarding her concerns with her medication  and also concerns with our services as far as returning calls etc. Did apologize for the delay in calling her back as well as not recieving messages that she has left. scheduled pt an appt and we will start a fresh relationship and if any problems arise to please let me know. Follow-up by: Mikey College CMA,  July 02, 2007 12:53 PM

## 2010-04-06 NOTE — Progress Notes (Signed)
Summary: refills  Phone Note Call from Patient   Reason for Call: Refill Medication Summary of Call: Pt needs a Refill of Lisinopril  [facility] is [facility] . Please, call her at [phone]  Thank You  Initial call taken by: Cheryll Dessert,  August 07, 2007 3:18 PM  Follow-up for Phone Call        Will send to provider. Follow-up by: Vesta Mixer CMA,  August 08, 2007 4:55 PM  Additional Follow-up for Phone Call Additional follow up Details #1::        lisnopril in not on pt's med list.  does she mean fosinopril (Monopril)?  Please double check. Additional Follow-up by: Lehman Prom FNP,  August 09, 2007 7:35 AM    Additional Follow-up for Phone Call Additional follow up Details #2::    Left message on answering machine for pt to return call  Follow-up by: Vesta Mixer CMA,  August 09, 2007 11:44 AM  Additional Follow-up for Phone Call Additional follow up Details #3:: Details for Additional Follow-up Action Taken: Spoke with pt she said it is the Fosinopril.    ........ Elmarie Shiley McCoy CMA  August 09, 2007 4:07 PM  Fosinopril was refilled 5/31--check to see if it's waiting for pt. to pick up.Julieanne Manson MD  August 09, 2007 6:26 PM  pharmacy did not have gave to them verbally Additional Follow-up by: Vesta Mixer CMA,  August 10, 2007 5:29 PM    Prescriptions: FOSINOPRIL SODIUM 20 MG  TABS (FOSINOPRIL SODIUM) 1 tab by mouth daily  #30 x 6   Entered by:   Vesta Mixer CMA   Authorized by:   Julieanne Manson MD   Signed by:   Vesta Mixer CMA on 08/10/2007   Method used:   Print then Give to Patient   RxID:   [id number]

## 2010-04-06 NOTE — Miscellaneous (Signed)
Summary: VIP  Patient: Melissa Chavez Note: All result statuses are Final unless otherwise noted.  Tests: (1) VIP (Medications)   LLIMPORTMEDS              "Result Below..."       RESULT: VENTOLIN HFA AERS 108 (90 Base) MCG/ACT*USE 2 PUFFS EVERY FOUR HOURS AS NEEDED FOR SHORTNESS OF BREATH  GENE HAS ICP VENTOLIN HFA 0308*11/09/2006*Last Refill: [id number]*******   LLIMPORTMEDS              "Result Below..."       RESULT: SINGULAIR TABS 10 MG*TAKE ONE (1) TABLET EACH DAY*11/09/2006*Last Refill: Baily.[facility]utter*******   LLIMPORTMEDS              "Result Below..."       RESULT: MONOPRIL TABS 40 MG*TAKE ONE-HALF TABLET DAILY*09/26/2006*Last  Refill: 11/28/2006*[id number]*******   LLIMPORTMEDS              "Result Below..."       RESULT: METRONIDAZOLE TABS 500 MG*TAKE 4 TABLETS BY MOUTH AT ONCE*05/05/2006*Last Refill: [id number]*******   LLIMPORTMEDS              "Result Below..."       RESULT: HYDROCHLOROTHIAZIDE TABS 25 MG*TAKE ONE (1) TABLET EVERY MORNING*11/09/2006*Last Refill: Unknown*[id number]*******   LLIMPORTMEDS              "Result Below..."       RESULT: FERROUS SULF 325MG  RED*TAKE ONE (1) TABLET BY MOUTH EVERY DAY*01/11/2006*Last Refill: 03/14/2006********   LLIMPORTMEDS              "Result Below..."       RESULT: ADVAIR DISKUS MISC 100-50 MCG/DOSE*1 PUFF TWICE A DAY*12/22/2005*Last Refill: 11/09/2006*[id number]*******   LLIMPORTALLS              NKDA***  Note: An exclamation mark (!) indicates a result that was not dispersed into the flowsheet. Document Creation Date: 01/04/2007 3:05 PM _______________________________________________________________________  (1) Order result status: Final Collection or observation date-time: 11/22/2006 Requested date-time: 11/22/2006 Receipt date-time:  Reported date-time: 11/22/2006 Referring Physician:   Ordering Physician:   Specimen Source:  Source: [place] Order Number:  Lab site:

## 2010-04-06 NOTE — Letter (Signed)
Summary: *HSN Results Follow up  [facility]  [address] [place], [place] [address]   Phone: [phone]  Fax: [phone]      10/13/2007   [facility] [address] [place], [place]  [address]   Dear  Ms. Melissa Chavez,                            ____S.Drinkard,FNP   ____D. Gore,FNP       ____B. McPherson,MD   ____V. Rankins,MD    __X__E. Mulberry,MD    ____N. Daphine Deutscher, FNP  ____D. Reche Dixon, MD    ____K. Philipp Deputy, MD    ____Other     This letter is to inform you that your recent test(s):  _______Pap Smear    ___X____Lab Test     _______X-ray    ___X____ is within acceptable limits  _______ requires a medication change  _______ requires a follow-up lab visit  _______ requires a follow-up visit with your provider   Comments:       _________________________________________________________ If you have any questions, please contact our office                     Sincerely,  Julieanne Manson MD [facility]

## 2010-04-06 NOTE — Assessment & Plan Note (Signed)
Summary: FLU SHOT//////KT  Nurse Visit   Vitals Entered By: Gaylyn Cheers RN (December 19, 2007 8:55 AM)                 Prior Medications: ZYRTEC ALLERGY 10 MG  TABS (CETIRIZINE HCL) 1 tab by mouth daily SINGULAIR 10 MG  TABS (MONTELUKAST SODIUM) 1 tab by mouth daily PAXIL 10 MG  TABS (PAROXETINE HCL) 1 tab by mouth daily FERROUS SULFATE 325 (65 FE) MG  TABS (FERROUS SULFATE) 1 tab by mouth daily PROTONIX 40 MG  TBEC (PANTOPRAZOLE SODIUM) 1 tab by mouth daily ALBUTEROL 90 MCG/ACT  AERS (ALBUTEROL) 2 puffs q 4h prn ADVAIR DISKUS 250-50 MCG/DOSE  MISC (FLUTICASONE-SALMETEROL) 1 inhalation two times a day NAPROSYN 500 MG  TABS (NAPROXEN) 1 tablet by mouth two times a day as needed FOSINOPRIL SODIUM 20 MG  TABS (FOSINOPRIL SODIUM) 1 tab by mouth daily DYAZIDE 37.5-25 MG  CAPS (TRIAMTERENE-HCTZ) 1 tab by mouth daily in a.m. GLUCOTROL XL 5 MG  TB24 (GLIPIZIDE) 1 tab by mouth q a.m. with breakfast. METFORMIN HCL 500 MG  TABS (METFORMIN HCL) 1 tab by mouth two times a day with meals GLUCOMETER ELITE CLASSIC   KIT (BLOOD GLUCOSE MONITORING SUPPL) test sugars two times a day GLUCOMETER ELITE TEST   STRP (GLUCOSE BLOOD) two times a day sugar checks FUROSEMIDE 20 MG  TABS (FUROSEMIDE) 1 tab po daily KLOR-CON M20 20 MEQ  CR-TABS (POTASSIUM CHLORIDE CRYS CR) 1 tab by mouth daily FLUTICASONE PROPIONATE 50 MCG/ACT  SUSP (FLUTICASONE PROPIONATE) 2 sprays each nostril daily METRONIDAZOLE 500 MG  TABS (METRONIDAZOLE) 4 tabs by mouth for 1 dose Current Allergies: No known allergies    Influenza Vaccine    Vaccine Type: Fluvax Non-MCR    Site: right deltoid    Mfr: Sanofi Pasteur    Dose: 0.5 ml    Route: IM    Given by: Gaylyn Cheers RN    Exp. Date: 09/03/2008    Lot #: [id number]    VIS given: 09/28/06 version given December 19, 2007.  Flu Vaccine Consent Questions    Do you have a history of severe allergic reactions to this vaccine? no    Any prior history of allergic reactions to egg  and/or gelatin? no    Do you have a sensitivity to the preservative Thimersol? no    Do you have a past history of Guillan-Barre Syndrome? no    Do you currently have an acute febrile illness? no    Have you ever had a severe reaction to latex? no    Vaccine information given and explained to patient? yes    Are you currently pregnant? no   Orders Added: 1)  Influenza Vaccine MCR [00025] 2)  Flu Vaccine 14yrs + [90658] 3)  Admin 1st Vaccine [90471] 4)  Admin of Therapeutic Inj  intramuscular or subcutaneous [96372] 5)  Influenza Vaccine NON MCR [00028] 6)  Est. Patient Nurse visit [09003] 7)  UA Dipstick w/o Micro (manual) [81002]    ]

## 2010-04-06 NOTE — Progress Notes (Signed)
Summary: BAD COUGH   Phone Note Call from Patient   Summary of Call: Melissa Chavez PT. SHE HAS A REAL BAD COUGH AND SHE IS ON BP MEDICAITON AND WOULD LIKE TO KNOW WHAT KIND OF COUGH MEDICINE SHE CAN TAKE. Initial call taken by: Leodis Rains,  Jul 11, 2007 11:39 AM  Follow-up for Phone Call        Spoke with pt she has tried the corcidin hbp and it is not helping.  She is on hctz, lasix and monopril.  She wants to know if you think she should come in.  There are no appointments slots left for this week. Follow-up by: Vesta Mixer CMA,  Jul 11, 2007 12:27 PM  Additional Follow-up for Phone Call Additional follow up Details #1::        Melissa Chavez Pt - If cough is the only symptom then she needs to continue taking the coricidan. She should also be taking her singulair and zyrtec to avoid allergy and asthma triggers that can cause cough. Additional Follow-up by: Lehman Prom FNP,  Jul 12, 2007 7:39 AM    Additional Follow-up for Phone Call Additional follow up Details #2::    Left message on answering machine for pt to return call [phone] Follow-up by: Vesta Mixer CMA,  Jul 12, 2007 12:43 PM  Additional Follow-up for Phone Call Additional follow up Details #3:: Details for Additional Follow-up Action Taken: Spoke with pt she states she is taking her other meds and will continue with the corcidian a few more days.  If not better in about 3-4 more days she will let us know. Additional Follow-up by: Vesta Mixer CMA,  Jul 18, 2007 4:30 PM

## 2010-04-06 NOTE — Letter (Signed)
Summary: NUTRITIONIST SUMMARY  NUTRITIONIST SUMMARY   Imported By: Arta Bruce 10/19/2007 10:44:09  _____________________________________________________________________  External Attachment:    Type:   Image     Comment:   External Document

## 2010-04-06 NOTE — Letter (Signed)
Summary: PHYSICIANS ORDERS/DENIED  PHYSICIANS ORDERS/DENIED   Imported By: Arta Bruce 06/11/2007 08:49:25  _____________________________________________________________________  External Attachment:    Type:   Image     Comment:   External Document

## 2010-04-06 NOTE — Progress Notes (Signed)
Summary: NEEDS A SOONER APPT.  Medications Added FUROSEMIDE 20 MG  TABS (FUROSEMIDE) 1 tab po daily KLOR-CON M20 20 MEQ  CR-TABS (POTASSIUM CHLORIDE CRYS CR) 1 tab by mouth daily       Phone Note Call from Patient Call back at [facility] Phone [phone]   Summary of Call: Melissa Chavez. WENT TO THE NEW [facility] IN [place] SUNDAY, FOR SWELLING IN ANKLES AND FEET. THEY PUT HER BACK ON FEROSIMIDE AND POTASSIUM THAT YOU TOOK HER OFF OF AND THEY GAVE HER ONLY 12 DAY SUPPLY AND WANTED HER TO BE SEEN SOONER THAN HER APPT. W/YOU ON JULY 31st. Initial call taken by: Leodis Rains,  September 17, 2007 9:21 AM  Follow-up for Phone Call        I need to know exactly what meds she is taking and what dosages before I make a change before seeing her.  She also needs to elevate her feet and watch her sodium intake Follow-up by: Julieanne Manson MD,  September 21, 2007 9:51 AM  Additional Follow-up for Phone Call Additional follow up Details #1::        Lasix 20mg , Klor con she said she is keeping her feet up and watching her Na. Additional Follow-up by: Vesta Mixer CMA,  September 25, 2007 4:50 PM    Additional Follow-up for Phone Call Additional follow up Details #2::    Have filled Lasix and Klor Con so she has enough for a month. Keep 7/31 appt. Follow-up by: Julieanne Manson MD,  September 26, 2007 10:59 AM  Additional Follow-up for Phone Call Additional follow up Details #3:: Details for Additional Follow-up Action Taken: Chavez aware and will [facility] [address] Additional Follow-up by: Vesta Mixer CMA,  September 26, 2007 12:12 PM  New/Updated Medications: FUROSEMIDE 20 MG  TABS (FUROSEMIDE) 1 tab po daily KLOR-CON M20 20 MEQ  CR-TABS (POTASSIUM CHLORIDE CRYS CR) 1 tab by mouth daily   Prescriptions: KLOR-CON M20 20 MEQ  CR-TABS (POTASSIUM CHLORIDE CRYS CR) 1 tab by mouth daily  #30 x 0   Entered and Authorized by:   Julieanne Manson MD   Signed by:   Julieanne Manson MD  on 09/26/2007   Method used:   Print then Give to Patient   RxID:   [id number] FUROSEMIDE 20 MG  TABS (FUROSEMIDE) 1 tab po daily  #30 x 0   Entered and Authorized by:   Julieanne Manson MD   Signed by:   Julieanne Manson MD on 09/26/2007   Method used:   Print then Give to Patient   RxID:   [id number]

## 2010-04-06 NOTE — Letter (Signed)
Summary: mailed requested records to [facility]  mailed requested records to [facility]   Imported By: Arta Bruce 09/30/2008 11:36:03  _____________________________________________________________________  External Attachment:    Type:   Image     Comment:   External Document  Appended Document: mailed requested records to [facility] Please see if pt. transferred care or not--med request from [facility] office at [place]--please check with pt.  Appended Document: mailed requested records to [facility] Left message on answering machine for pt to return call at [phone]  Appended Document: mailed requested records to [facility] Called [phone] and was told by a young lady I had the wrong number.  Will mail letter to pt to have her call us to let us know if she transferred her care.

## 2010-04-06 NOTE — Miscellaneous (Signed)
Summary: eye exam documentation   Clinical Lists Changes  Observations: Added new observation of DIAB EYE EX: Dr. Hazle Quant (07/03/2007 10:51)

## 2010-04-06 NOTE — Progress Notes (Signed)
Summary: HA  Phone Note Call from Patient   Reason for Call: Talk to Nurse, Talk to Doctor Summary of Call: Pt was diagnosis with DM and somebody told her that call the Dr and find about how to get Free Shoes & Meds  F.Y.I. Pt have Medicare . Please, call her at [phone]  Thank You  Initial call taken by: Cheryll Dessert,  August 27, 2007 10:56 AM  Follow-up for Phone Call        Pt stated her uncle has dm and gets shoes from a company.  She will get more information.  Also stated she just got back from vacation and is having what she thinks are sinus HA's every day.  She takes Ibuprofen that help for a little bit, but then HA comes back.  She will be in on Tuesday for an appt with Susie. Follow-up by: Vesta Mixer CMA,  September 03, 2007 3:36 PM  Additional Follow-up for Phone Call Additional follow up Details #1::        The shoes can only be obtained if you have complications of diabetes.  Needs to make an appt for headaches and we can discuss. Additional Follow-up by: Julieanne Manson MD,  September 04, 2007 6:52 PM    Additional Follow-up for Phone Call Additional follow up Details #2::    Pt aware and she will make appt Follow-up by: Vesta Mixer CMA,  September 05, 2007 4:34 PM

## 2010-04-06 NOTE — Progress Notes (Signed)
Summary: request med refill-needs appt.   Phone Note From Pharmacy   [facility]--has not been seen in some time--needs ov before will fill--notify Initial call taken by: Julieanne Manson MD,  August 05, 2009 8:00 AM  Follow-up for Phone Call        Pt's. phone does not accept incoming call. No other numbers available. [facility] [address] [facility] when she calls they will let her know she must schedule appt. prior to additional refills. They do not have any other numbers for her. Follow-up by: Gaylyn Cheers RN,  August 06, 2009 10:13 AM

## 2010-04-06 NOTE — Medication Information (Signed)
Summary: DIABETES TESTING SUPPLIES  DIABETES TESTING SUPPLIES   Imported By: Arta Bruce 10/01/2007 09:34:00  _____________________________________________________________________  External Attachment:    Type:   Image     Comment:   External Document

## 2010-04-06 NOTE — Assessment & Plan Note (Signed)
Summary: Acute - Right foot Contusion  Medications Added NAPROSYN 500 MG  TABS (NAPROXEN) 1 tablet by mouth two times a day x 3 days then as needed      Allergies Added: NKDA  Vital Signs:  Patient Profile:   50 Years Old Female LMP:     04/2007 Height:     62 inches Weight:      249 pounds BMI:     45.71 BSA:     2.10 Temp:     98.7 degrees F oral Pulse rate:   88 / minute Pulse rhythm:   regular Resp:     20 per minute BP sitting:   152 / 92  (left arm) Cuff size:   large  Pt. in pain?   yes    Location:   feet, legs    Intensity:   8    Type:       aching  Vitals Entered By: Levon Hedger (April 23, 2007 11:06 AM)  Menstrual History: LMP (date): 04/2007              Is Patient Diabetic? No  Does patient need assistance? Ambulation Normal   Last PAP Date 2008   Chief Complaint:  feet swelling.  History of Present Illness:  Pt into the office with complaints of swelling and pain to right foot.  Presents with slide bedroom shoes. She does have some swelling in her feet at baseline but notes that the right is worse due to an acute injury on last week. She reports that she is able to walk on the foot.  a skateboard hit her foot.  She notes that event happened 3 days ago.  She is able to bear weight on the right foot.  Swelling has improved over the weekend though she still has some slight pain.  she has not taken any meds or done any therapies to help with the injury.  Pain has not progressed instead it has slightly improved  HTN - She is asking questions about her blood pressure meds and the best time to take.  She reports that she just started separating the 2 meds.  she notes that the diuretic causes her to go to the bathroom all during the night.  she admits that she was taking it during the afternoon.  Also pt eats lots of preserved foods that could be high in sodium.  BP elevated today but she has only taken 1 of her 2 meds.  Asthma /allergies- The pt is  going to an allergist.  She reports that she had a skin test done on last visit. She has to return in March. She does take singulair during the day and is asking when is the best way. to take the meds.  No tobacco use She has allergies to dogs, cats, cockroaches, dust, fish - She does have to take allergy injections.  obesity - pt has joined the [employer] and plans to start water aerobics in an attempt to lose weight and help with joint (knee) pain.  She notes that this was recommended by Dr. Delrae Alfred.    Prior Medication List:  ZYRTEC ALLERGY 10 MG  TABS (CETIRIZINE HCL) 1 tab by mouth daily SINGULAIR 10 MG  TABS (MONTELUKAST SODIUM) 1 tab by mouth daily PAXIL 10 MG  TABS (PAROXETINE HCL) 1 tab by mouth daily MONOPRIL 40 MG  TABS (FOSINOPRIL SODIUM) 1/2 tab by mouth daily FERROUS SULFATE 325 (65 FE) MG  TABS (FERROUS SULFATE) 1 tab by  mouth daily PROTONIX 40 MG  TBEC (PANTOPRAZOLE SODIUM) 1 tab by mouth daily HYDROCHLOROTHIAZIDE 25 MG  TABS (HYDROCHLOROTHIAZIDE) 1 tab by mouth daily ALBUTEROL 90 MCG/ACT  AERS (ALBUTEROL) 2 puffs q 4h prn ADVAIR DISKUS 250-50 MCG/DOSE  MISC (FLUTICASONE-SALMETEROL) 1 inhalation two times a day   Current Allergies: No known allergies     Risk Factors:  Drug use:  no Caffeine use:  2 drinks per day Alcohol use:  no Seatbelt use:  100 % Sun Exposure:  occasionally  Family History Risk Factors:    Family History of MI in females < 26 years old:  no    Family History of MI in males < 67 years old:  no   Review of Systems  CV      Complains of swelling of feet.      Denies chest pain or discomfort and fatigue.  Resp      Denies cough and shortness of breath.  GI      Denies abdominal pain, nausea, and vomiting.  MS      right foot pain   Physical Exam  General:     alert and overweight-appearing.   Head:     normocephalic.   Eyes:     pupils round.   Ears:     R ear normal and L ear normal.   Nose:     no external deformity.     Mouth:     fair dentition.   Lungs:     normal respiratory effort, no intercostal retractions, no accessory muscle use, and normal breath sounds.   Heart:     normal rate, regular rhythm, no murmur, no gallop, and no rub.   Abdomen:     soft, non-tender, and normal bowel sounds.   Msk:     bil bunions    Foot/Ankle Exam  Foot Exam:    Right:    Inspection:  Abnormal       Location:  forefoot    Stability:  stable    Tenderness:  yes    Swelling:  yes    Erythema:  no    tenderness with palpation of dorsal aspect of foot DP +2  Active ROM of foot    Impression & Recommendations:  Problem # 1:  CONTUSION, RIGHT FOOT (ICD-924.20) Pt advised to limit ambulation pt to apply ace bandage to affected area take anti-inflammatories as ordered warm compress three times a day to affected area Active ROM in extremity and swelling is minimal. f/u if symptoms worsen  Problem # 2:  HYPERTENSION (ICD-401.9) Pt advised that she can take both her BP meds during the day. She also needs to examine diet and limit sodium Her updated medication list for this problem includes:    Monopril 40 Mg Tabs (Fosinopril sodium) .Marland Kitchen... 1/2 tab by mouth daily    Hydrochlorothiazide 25 Mg Tabs (Hydrochlorothiazide) .Marland Kitchen... 1 tab by mouth daily   Problem # 3:  ASTHMA (ICD-493.90) Recent visit with Allergist.  will get those records in this office to review. Her updated medication list for this problem includes:    Singulair 10 Mg Tabs (Montelukast sodium) .Marland Kitchen... 1 tab by mouth daily    Albuterol 90 Mcg/act Aers (Albuterol) .Marland Kitchen... 2 puffs q 4h prn    Advair Diskus 250-50 Mcg/dose Misc (Fluticasone-salmeterol) .Marland Kitchen... 1 inhalation two times a day   Complete Medication List: 1)  Zyrtec Allergy 10 Mg Tabs (Cetirizine hcl) .Marland Kitchen.. 1 tab by mouth daily 2)  Singulair 10  Mg Tabs (Montelukast sodium) .Marland Kitchen.. 1 tab by mouth daily 3)  Paxil 10 Mg Tabs (Paroxetine hcl) .Marland Kitchen.. 1 tab by mouth daily 4)  Monopril 40 Mg Tabs  (Fosinopril sodium) .... 1/2 tab by mouth daily 5)  Ferrous Sulfate 325 (65 Fe) Mg Tabs (Ferrous sulfate) .Marland Kitchen.. 1 tab by mouth daily 6)  Protonix 40 Mg Tbec (Pantoprazole sodium) .Marland Kitchen.. 1 tab by mouth daily 7)  Hydrochlorothiazide 25 Mg Tabs (Hydrochlorothiazide) .Marland Kitchen.. 1 tab by mouth daily 8)  Albuterol 90 Mcg/act Aers (Albuterol) .... 2 puffs q 4h prn 9)  Advair Diskus 250-50 Mcg/dose Misc (Fluticasone-salmeterol) .Marland Kitchen.. 1 inhalation two times a day 10)  Naprosyn 500 Mg Tabs (Naproxen) .Marland Kitchen.. 1 tablet by mouth two times a day x 3 days then as needed   Patient Instructions: 1)  Apply ace bandage to right foot during day and then remove at night.   2)  Apply warm compresses to foot three times a day  3)  Naprosyn 500mg  two times a day x 3 days then as needed 4)  Elevate foot while at home 5)  If no improvement in 7-10 days then return to office   Rx reprinted on tamper proof paper Prescriptions: NAPROSYN 500 MG  TABS (NAPROXEN) 1 tablet by mouth two times a day x 3 days then as needed  #30 x 0   Entered and Authorized by:   Lehman Prom FNP   Signed by:   Lehman Prom FNP on 04/23/2007   Method used:   Print then Give to Patient   RxID:   [id number] NAPROSYN 500 MG  TABS (NAPROXEN) 1 tablet by mouth two times a day x 3 days then as needed  #30 x 0   Entered and Authorized by:   Lehman Prom FNP   Signed by:   Lehman Prom FNP on 04/23/2007   Method used:   Print then Give to Patient   RxID:   [id number]  ]

## 2010-04-06 NOTE — Letter (Signed)
Summary: [facility]  [facility]   Imported By: Leodis Rains 07/21/2008 11:48:22  _____________________________________________________________________  External Attachment:    Type:   Image     Comment:   External Document

## 2010-04-06 NOTE — Letter (Signed)
Summary: INFLUENZA VACCINATION  INFLUENZA VACCINATION   Imported By: Arta Bruce 02/13/2007 16:51:50  _____________________________________________________________________  External Attachment:    Type:   Image     Comment:   External Document

## 2010-04-06 NOTE — Letter (Signed)
Summary: *HSN Results Follow up  [facility]  [address] [place], [place] [address]   Phone: [phone]  Fax: [phone]      04/04/2008   [facility] [address] [place], [place]  [address]   Dear  Ms. Melissa Chavez,                            ____S.Drinkard,FNP   ____D. Gore,FNP       ____B. McPherson,MD   ____V. Rankins,MD    __x__E. Mulberry,MD    ____N. Daphine Deutscher, FNP  ____D. Reche Dixon, MD    ____K. Philipp Deputy, MD    ____Other     This letter is to inform you that your recent test(s):  _______Pap Smear    ___x____Lab Test     _______X-ray    _______ is within acceptable limits  _______ requires a medication change  _______ requires a follow-up lab visit  ___x____ requires a follow-up visit with your provider   Comments: Please call regarding your recent lab test.  Thank You.      _________________________________________________________ If you have any questions, please contact our office                     Sincerely,  Tiffany McCoy CMA [facility]

## 2010-04-06 NOTE — Progress Notes (Signed)
   Phone Note Outgoing Call   Summary of Call: Please notify patient that [employer] papers filled out and faxed.  Please let her know in future, would appreciate a definite decision on what supply company she plans to use before she has them send [place] forms to fill out so we don't waste our time on multiple forms. Initial call taken by: Julieanne Manson MD,  September 30, 2007 4:13 PM  Follow-up for Phone Call        left message to return call..... Follow-up by: Mikey College CMA,  October 03, 2007 10:40 AM  Additional Follow-up for Phone Call Additional follow up Details #1::        pt advised and she did apologize for the confusion but she was confused her self. But pt thank you again for completing. Additional Follow-up by: Mikey College CMA,  October 03, 2007 12:29 PM

## 2010-04-06 NOTE — Letter (Signed)
Summary: [facility]   Imported By: Arta Bruce 02/12/2007 11:19:00  _____________________________________________________________________  External Attachment:    Type:   Image     Comment:   External Document

## 2010-04-06 NOTE — Letter (Signed)
Summary: REHAB/ DISCHARGE SUMMARY  REHAB/ DISCHARGE SUMMARY   Imported By: Arta Bruce 03/21/2007 16:09:20  _____________________________________________________________________  External Attachment:    Type:   Image     Comment:   External Document

## 2010-04-06 NOTE — Progress Notes (Signed)
Summary: Advair refill   Phone Note Call from Patient Call back at [facility] Phone [phone]   Caller: Patient Complaint: Abdominal Pain Summary of Call: Pt just called because she is out from her advair medication and would like to get more refills.  [facility] Dr. Delrae Alfred Initial call taken by: Manon Hilding,  February 20, 2008 3:06 PM  Follow-up for Phone Call        Forward to provider for auth. Follow-up by: Vesta Mixer CMA,  February 20, 2008 3:26 PM  Additional Follow-up for Phone Call Additional follow up Details #1::        [facility] Additional Follow-up by: Lehman Prom FNP,  February 21, 2008 7:36 AM    Additional Follow-up for Phone Call Additional follow up Details #2::    The pt just called and I let her know that her medication has been sent electronicaly.Marland KitchenMarland KitchenManon Hilding  February 21, 2008 8:57 AM    Prescriptions: ADVAIR DISKUS 250-50 MCG/DOSE  MISC (FLUTICASONE-SALMETEROL) 1 inhalation two times a day  #1 x 6   Entered and Authorized by:   Lehman Prom FNP   Signed by:   Lehman Prom FNP on 02/21/2008   Method used:   [facility]        [facility]  [address] [phone]* (retail)       [address]       [place], [place]  [address]       Ph: [phone] or [phone]       Fax: [phone]   RxID:   [id number]

## 2010-04-06 NOTE — Progress Notes (Signed)
Summary: No longer a pt. of this practice  Phone Note Outgoing Call   Summary of Call: Request for refill sent to [place]. Pt. called re request, stated that it was sent to [place] in error.  Pharmacy called to correct error.  Pt. has new provider, no longer a [facility] pt.   Initial call taken by: Dutch Quint RN,  January 04, 2010 5:34 PM

## 2010-04-06 NOTE — Letter (Signed)
Summary: [facility]   Imported By: Leodis Rains 10/03/2008 12:15:02  _____________________________________________________________________  External Attachment:    Type:   Image     Comment:   External Document

## 2010-04-06 NOTE — Assessment & Plan Note (Signed)
Summary: per nurse/ sore throat pain/ eating problems for solid meals//gk    Vital Signs:  Patient Profile:   50 Years Old Female LMP:     01/10/2007 Height:     62 inches Weight:      237.5 pounds BMI:     43.60 Temp:     98.3 degrees F Pulse rate:   80 / minute Pulse rhythm:   regular Resp:     20 per minute BP sitting:   122 / 80  (left arm) Cuff size:   large  Pt. in pain?   no  Vitals Entered By: Vesta Mixer CMA (January 30, 2007 8:16 AM)  Menstrual History: LMP (date): 01/10/2007              Is Patient Diabetic? No  Does patient need assistance? Ambulation Normal Comments pt did not bring meds     Chief Complaint:  feels like things get stuck in her throat and that things are not digesting well and the past week has been fine though.  History of Present Illness: When swallows, food gets stuck in throat.  Sometimes occurs everyday, but in past week, hasn't had any problems.  Having sense of burning in chest and throat after eating--regurgitating food at times after eating.  Happens at least 3 times weekly.  Non-smoker, no alcohol, not much chocolate, lot of onions, Some Goody Powders about 2 weeks ago.  Later, admits to using Aleve--just not in last week.  Symptoms have been going on for over a month.  No melena or hematochezia.  Pt. requests flu shot--hx of asthma.  Pt. states she is using Albuterol 2-3 times daily.  Is using Advair and Singulair regularly.  Current Allergies: No known allergies     Risk Factors:  Tobacco use:  never    Physical Exam  Mouth:     pharynx pink and moist.   Neck:     No deformities, masses, or tenderness noted. Lungs:     Normal respiratory effort, chest expands symmetrically. Lungs are clear to auscultation, no crackles or wheezes. Heart:     Normal rate and regular rhythm. S1 and S2 normal without gallop, murmur, click, rub or other extra sounds. Abdomen:     Obese. Bowel sounds positive,abdomen soft and  non-tender without masses, organomegaly or hernias noted.    Impression & Recommendations:  Problem # 1:  GERD (ICD-530.81) Gerd precautions discussed. Decrease use of NSAIDS for arthritis if possible. Her updated medication list for this problem includes:    Protonix 40 Mg Tbec (Pantoprazole sodium) .Marland Kitchen... 1 tab by mouth daily   Problem # 2:  ASTHMA (ICD-493.90) Increase Advair to 250/50 two times a day Flu shot today  The following medications were removed from the medication list:    Advair Diskus 100-50 Mcg/dose Misc (Fluticasone-salmeterol)  Her updated medication list for this problem includes:    Singulair 10 Mg Tabs (Montelukast sodium) .Marland Kitchen... 1 tab by mouth daily    Albuterol 90 Mcg/act Aers (Albuterol) .Marland Kitchen... 2 puffs q 4h prn    Advair Diskus 250-50 Mcg/dose Misc (Fluticasone-salmeterol) .Marland Kitchen... 1 inhalation two times a day   Complete Medication List: 1)  Zyrtec Allergy 10 Mg Tabs (Cetirizine hcl) .Marland Kitchen.. 1 tab by mouth daily 2)  Singulair 10 Mg Tabs (Montelukast sodium) .Marland Kitchen.. 1 tab by mouth daily 3)  Paxil 10 Mg Tabs (Paroxetine hcl) .Marland Kitchen.. 1 tab by mouth daily 4)  Monopril 40 Mg Tabs (Fosinopril sodium) .... 1/2 tab by mouth  daily 5)  Ferrous Sulfate 325 (65 Fe) Mg Tabs (Ferrous sulfate) .Marland Kitchen.. 1 tab by mouth daily 6)  Protonix 40 Mg Tbec (Pantoprazole sodium) .Marland Kitchen.. 1 tab by mouth daily 7)  Hydrochlorothiazide 25 Mg Tabs (Hydrochlorothiazide) .Marland Kitchen.. 1 tab by mouth daily 8)  Albuterol 90 Mcg/act Aers (Albuterol) .... 2 puffs q 4h prn 9)  Advair Diskus 250-50 Mcg/dose Misc (Fluticasone-salmeterol) .Marland Kitchen.. 1 inhalation two times a day  Other Orders: Flu Vaccine 11yrs + (60454) Admin 1st Vaccine (09811)   Patient Instructions: 1)  Take your Meds!!! 2)  Call for follow up at end of December, beginning of January (with Dr.  Delrae Alfred.    Prescriptions: ZYRTEC ALLERGY 10 MG  TABS (CETIRIZINE HCL) 1 tab by mouth daily  #30 x 6   Entered and Authorized by:   Julieanne Manson MD    Signed by:   Julieanne Manson MD on 01/30/2007   Method used:   Print then Give to Patient   RxID:   [id number] PAXIL 10 MG  TABS (PAROXETINE HCL) 1 tab by mouth daily  #30 x 6   Entered and Authorized by:   Julieanne Manson MD   Signed by:   Julieanne Manson MD on 01/30/2007   Method used:   Print then Give to Patient   RxID:   [id number] ADVAIR DISKUS 250-50 MCG/DOSE  MISC (FLUTICASONE-SALMETEROL) 1 inhalation two times a day  #1 x 6   Entered and Authorized by:   Julieanne Manson MD   Signed by:   Julieanne Manson MD on 01/30/2007   Method used:   Print then Give to Patient   RxID:   [id number] PROTONIX 40 MG  TBEC (PANTOPRAZOLE SODIUM) 1 tab by mouth daily  #30 x 4   Entered and Authorized by:   Julieanne Manson MD   Signed by:   Julieanne Manson MD on 01/30/2007   Method used:   Print then Give to Patient   RxID:   [id number]  ]  Influenza Vaccine    Vaccine Type: Fluvax 3+    Site: left deltoid    Mfr: Sanofi Pasteur    Dose: 0.5 ml    Route: IM    Given by: Vesta Mixer CMA    Exp. Date: 09/04/2007    Lot #: [id number]    VIS given: 09/28/06 version given January 30, 2007.  Flu Vaccine Consent Questions    Do you have a history of severe allergic reactions to this vaccine? no    Any prior history of allergic reactions to egg and/or gelatin? no    Do you have a sensitivity to the preservative Thimersol? no    Do you have a past history of Guillan-Barre Syndrome? no    Do you currently have an acute febrile illness? no    Have you ever had a severe reaction to latex? no    Vaccine information given and explained to patient? yes    Are you currently pregnant? no

## 2010-04-06 NOTE — Letter (Signed)
Summary: [facility]   Imported By: Leodis Rains 07/29/2008 14:56:54  _____________________________________________________________________  External Attachment:    Type:   Image     Comment:   External Document

## 2010-04-06 NOTE — Letter (Signed)
Summary: *HSN Results Follow up  [facility]  [address] [place], [place] [address]   Phone: [phone]  Fax: [phone]      10/14/2008   [facility] [address] [place], [place]  [address]   Dear  Ms. Melissa Chavez,                            ____S.Drinkard,FNP   ____D. Gore,FNP       ____B. McPherson,MD   ____V. Rankins,MD    __x__E. Mulberry,MD    ____N. Daphine Deutscher, FNP  ____D. Reche Dixon, MD    ____K. Philipp Deputy, MD    ____Other     This letter is to inform you that your recent test(s):  _______Pap Smear    _______Lab Test     _______X-ray    _______ is within acceptable limits  _______ requires a medication change  _______ requires a follow-up lab visit  _______ requires a follow-up visit with your [name]   Comments:  Please give our office a call at your earliest convience.  Thank you.       _________________________________________________________ If you have any questions, please contact our office                     Sincerely,  Vesta Mixer CMA [facility]

## 2010-04-06 NOTE — Progress Notes (Signed)
   Phone Note Outgoing Call   Summary of Call: Refill request for Maxzide. WIll fill x 1 mo. Not seen in over a year. Schedule f/u visit. Initial call taken by: Brynda Rim,  April 17, 2009 8:18 AM  Follow-up for Phone Call        Call pt @ [phone], at the subscribers request the phone is not taking incoming calls will mail ltr .Marland KitchenMarland KitchenGeanie Cooley   April 17, 2009 11:41 AM     Prescriptions: DYAZIDE 37.5-25 MG  CAPS (TRIAMTERENE-HCTZ) 1 tab by mouth daily in a.m.  #30 x 0   Entered and Authorized by:   Tereso Newcomer PA-C   Signed by:   Tereso Newcomer PA-C on 04/17/2009   Method used:   [facility]  [address] [phone][facility] (retail)       [address]       [place], [place]  [id number]       Ph: [phone] or [phone]       Fax: [phone]   RxID:   [id number]

## 2010-04-06 NOTE — Letter (Signed)
Summary: [facility] [employer]PPLIES  DIABETES TESTING SUPPLIES   Imported By: Arta Bruce 09/12/2007 10:57:12  _____________________________________________________________________  External Attachment:    Type:   Image     Comment:   External Document

## 2010-04-06 NOTE — Letter (Signed)
Summary: *HSN Results Follow up  [facility]  [address] [place], [place] [address]   Phone: [phone]  Fax: [phone]      04/17/2009   [facility] [address] [place], [place]  [address]   Dear  Melissa Chavez,                            ____S.Drinkard,FNP   ____D. Gore,FNP       ____B. McPherson,MD   ____V. Rankins,MD    __xx__E. Mulberry,MD    ____N. Daphine Deutscher, FNP  ____D. Reche Dixon, MD    ____K. Philipp Deputy, MD    ____Other     This letter is to inform you that your recent test(s):  _______Pap Smear    _______Lab Test     _______X-ray __x__ Prescription request    _______ is within acceptable limits  _______ requires a medication change  _______ requires a follow-up lab visit  ___xx___ requires a follow-up visit with your provider   Comments:  Please call to schedule an appointment with Dr. Delrae Alfred.  Thank  you.       _________________________________________________________ If you have any questions, please contact our office                     Sincerely,  Vesta Mixer CMA [facility]

## 2010-04-06 NOTE — Progress Notes (Signed)
----   Converted from flag ---- ---- 12/19/2006 2:15 PM, Arta Bruce wrote:   ---- 12/19/2006 11:53 AM, Terrilee Files Pharm D wrote: Refill request for: 1) Monopril 40mg   #15 - take 1/2 tablet once daily  last RF 11/28/06 2)  Advair 100/50  #1 - inhale 1 puff twice daily   last RF  11/09/06 ------------------------------  Phone Note From Pharmacy   Summary of Call: GP request for refills Initial call taken by: Julieanne Manson MD,  December 19, 2006 6:38 PM  Follow-up for Phone Call        Rx faxed to [facility]  Follow-up by: Vesta Mixer CMA,  December 20, 2006 11:05 AM    New/Updated Medications: MONOPRIL 40 MG  TABS (FOSINOPRIL SODIUM) 1/2 tab by mouth daily   Prescriptions: MONOPRIL 40 MG  TABS (FOSINOPRIL SODIUM) 1/2 tab by mouth daily  #15 x 6   Entered and Authorized by:   Julieanne Manson MD   Signed by:   Julieanne Manson MD on 12/19/2006   Method used:   Printed then faxed to ...         RxID:   [id number] ADVAIR DISKUS 100-50 MCG/DOSE  MISC (FLUTICASONE-SALMETEROL)   #1 x 6   Entered and Authorized by:   Julieanne Manson MD   Signed by:   Julieanne Manson MD on 12/19/2006   Method used:   Printed then faxed to ...         RxID:   [id number]

## 2010-04-06 NOTE — Progress Notes (Signed)
   Phone Note Call from Patient Call back at [facility] Phone [phone]   Caller: Patient Summary of Call: The patient has problem for digesting her meals; it seem according to her, that her food is in between her throat and airway.  The problem increase when she is digesting solid meals compare with liquid meals; therefore, is hard for her to eat proteins.  She wants to make sure her protein level is under standard limit or if still remain very low. Dr. Delrae Alfred  Initial call taken by: Manon Hilding,  January 11, 2007 12:52 PM  Follow-up for Phone Call        Left message on answering machine for pt to return call  Follow-up by: Gennaro Africa 01/17/07  Additional Follow-up for Phone Call Additional follow up Details #1::        Appt Scheduled Additional Follow-up by: Manon Hilding,  January 18, 2007 11:47 AM

## 2010-04-06 NOTE — Progress Notes (Signed)
Summary: referral    // Dr Delrae Alfred  Phone Note Call from Patient Call back at [facility] Phone [phone]   Caller: Patient Summary of Call: The patient wants to know if the physician already referal her to a physiical therapist.  She said that the physiican is aware of her problem. She needs either an order statetment or an approval that allow her to see a physicial therapist. Initial call taken by: Manon Hilding,  January 03, 2007 9:03 AM  Follow-up for Phone Call        referral refaxed to pt Follow-up by: Vesta Mixer CMA,  January 05, 2007 10:23 AM

## 2010-04-06 NOTE — Assessment & Plan Note (Signed)
Summary: BLOOD SUGAR HIGH  / NS   Vital Signs:  Patient Profile:   50 Years Old Female LMP:     08/24/2007 Height:     62 inches Weight:      247 pounds BMI:     45.34 Temp:     97.0 degrees F Pulse rate:   76 / minute Pulse rhythm:   regular Resp:     18 per minute BP sitting:   132 / 84  (left arm) Cuff size:   large  Pt. in pain?   yes    Location:   head    Intensity:   7  Vitals Entered By: Vesta Mixer CMA (August 24, 2007 9:05 AM)  Menstrual History: LMP (date): 08/24/2007              CBG Result 195  Does patient need assistance? Ambulation Normal     Chief Complaint:  blood sugars have been running high not a know diabetic and .  History of Present Illness: 1.  Elevated blood sugars:  noted at [facility] for swelling of ankles.  Sugar was 235.  Pt. had had high sugar/carb meal prior.  Pt. was started on potassium supplementation secondary to a K+ of 3.1 and her Furosemide was increased from as needed to two times a day (20 mg.)  2.  Hypertension:  Already on an ACE I.  BP with fair control  3.  Peripheral Edema:  Pt. still describing a diet with too much salt.    Prior Medications Reviewed Using: Medication Bottles  Current Allergies: No known allergies       Physical Exam  Lungs:     Normal respiratory effort, chest expands symmetrically. Lungs are clear to auscultation, no crackles or wheezes. Heart:     Normal rate and regular rhythm. S1 and S2 normal without gallop, murmur, click, rub or other extra sounds. Extremities:     MIld pitting edema to pretibial area bilaterally.    Impression & Recommendations:  Problem # 1:  NEW ONSET DIABETES MELLITUS, TYPE II, UNCONTROLLED (ICD-250.02) Long discussion on complications and treatment, focusing mainly on lifestyle changes to induce weight loss and maintain a healthier diet. Referral to Diabetic Educator Start Glucotrol and Metformin Glucometer and test  strips. Her updated medication list for this problem includes:    Fosinopril Sodium 20 Mg Tabs (Fosinopril sodium) .Marland Kitchen... 1 tab by mouth daily    Glucotrol Xl 5 Mg Tb24 (Glipizide) .Marland Kitchen... 1 tab by mouth q a.m. with breakfast.    Metformin Hcl 500 Mg Tabs (Metformin hcl) .Marland Kitchen... 1 tab by mouth two times a day with meals   Problem # 2:  PERIPHERAL EDEMA (ICD-782.3) D/C Hctz and furosemide--switch to Dyazide. Follow up in 2 weeks for BP check and BMET The following medications were removed from the medication list:    Hydrochlorothiazide 25 Mg Tabs (Hydrochlorothiazide) .Marland Kitchen... 1 tab by mouth daily    Furosemide 20 Mg Tabs (Furosemide) .Marland Kitchen... 1 tab by mouth daily as needed leg swelling.  eat an orange the same day.  Her updated medication list for this problem includes:    Dyazide 37.5-25 Mg Caps (Triamterene-hctz) .Marland Kitchen... 1 tab by mouth daily in a.m.   Problem # 3:  HYPERTENSION (ICD-401.9) Changes as noted previously The following medications were removed from the medication list:    Hydrochlorothiazide 25 Mg Tabs (Hydrochlorothiazide) .Marland Kitchen... 1 tab by mouth daily    Furosemide 20 Mg Tabs (Furosemide) .Marland KitchenMarland KitchenMarland KitchenMarland Kitchen  1 tab by mouth daily as needed leg swelling.  eat an orange the same day.  Her updated medication list for this problem includes:    Fosinopril Sodium 20 Mg Tabs (Fosinopril sodium) .Marland Kitchen... 1 tab by mouth daily    Dyazide 37.5-25 Mg Caps (Triamterene-hctz) .Marland Kitchen... 1 tab by mouth daily in a.m.   Problem # 4:  HYPOKALEMIA (ICD-276.8) Diuretic changes as noted. Stop potassium supplementation BMET in 2 weeks.  Complete Medication List: 1)  Zyrtec Allergy 10 Mg Tabs (Cetirizine hcl) .Marland Kitchen.. 1 tab by mouth daily 2)  Singulair 10 Mg Tabs (Montelukast sodium) .Marland Kitchen.. 1 tab by mouth daily 3)  Paxil 10 Mg Tabs (Paroxetine hcl) .Marland Kitchen.. 1 tab by mouth daily 4)  Ferrous Sulfate 325 (65 Fe) Mg Tabs (Ferrous sulfate) .Marland Kitchen.. 1 tab by mouth daily 5)  Protonix 40 Mg Tbec (Pantoprazole sodium) .Marland Kitchen.. 1 tab by mouth  daily 6)  Albuterol 90 Mcg/act Aers (Albuterol) .... 2 puffs q 4h prn 7)  Advair Diskus 250-50 Mcg/dose Misc (Fluticasone-salmeterol) .Marland Kitchen.. 1 inhalation two times a day 8)  Naprosyn 500 Mg Tabs (Naproxen) .Marland Kitchen.. 1 tablet by mouth two times a day as needed 9)  Fosinopril Sodium 20 Mg Tabs (Fosinopril sodium) .Marland Kitchen.. 1 tab by mouth daily 10)  Dyazide 37.5-25 Mg Caps (Triamterene-hctz) .Marland Kitchen.. 1 tab by mouth daily in a.m. 11)  Glucotrol Xl 5 Mg Tb24 (Glipizide) .Marland Kitchen.. 1 tab by mouth q a.m. with breakfast. 12)  Metformin Hcl 500 Mg Tabs (Metformin hcl) .Marland Kitchen.. 1 tab by mouth two times a day with meals 13)  Glucometer Elite Classic Kit (Blood glucose monitoring suppl) .... Test sugars two times a day 14)  Glucometer Elite Test Strp (Glucose blood) .... Two times a day sugar checks   Patient Instructions: 1)  Follow up for nurse visit in 2 weeks--bp check and BMET. 2)  Stop potassium, hydrochlorothiazide and furosemide. 3)  Referral to Diabetic Educator for instruction on DM, diet, exercise, low sodium diet, use of glucometer. 4)  Call if leg swelling worsens. 5)  Call for follow up with Dr. Delrae Alfred in 1 month   Prescriptions: GLUCOMETER ELITE TEST   STRP (GLUCOSE BLOOD) two times a day sugar checks  #100 x 5   Entered and Authorized by:   Julieanne Manson MD   Signed by:   Julieanne Manson MD on 08/24/2007   Method used:   Print then Give to Patient   RxID:   [id number] GLUCOMETER ELITE CLASSIC   KIT (BLOOD GLUCOSE MONITORING SUPPL) test sugars two times a day  #1 x 0   Entered and Authorized by:   Julieanne Manson MD   Signed by:   Julieanne Manson MD on 08/24/2007   Method used:   Print then Give to Patient   RxID:   [id number] METFORMIN HCL 500 MG  TABS (METFORMIN HCL) 1 tab by mouth two times a day with meals  #60 x 5   Entered and Authorized by:   Julieanne Manson MD   Signed by:   Julieanne Manson MD on 08/24/2007   Method used:   Print then Give to Patient   RxID:    [id number] GLUCOTROL XL 5 MG  TB24 (GLIPIZIDE) 1 tab by mouth q a.m. with breakfast.  #30 x 5   Entered and Authorized by:   Julieanne Manson MD   Signed by:   Julieanne Manson MD on 08/24/2007   Method used:   Print then Give to Patient   RxID:   [id number]  DYAZIDE 37.5-25 MG  CAPS (TRIAMTERENE-HCTZ) 1 tab by mouth daily in a.m.  #30 x 6   Entered and Authorized by:   Julieanne Manson MD   Signed by:   Julieanne Manson MD on 08/24/2007   Method used:   Print then Give to Patient   RxID:   [id number]  ] Laboratory Results   Blood Tests     HGBA1C: 8.9%   (Normal Range: Non-Diabetic - 3-6%   Control Diabetic - 6-8%) CBG Random:: 195mg /dL

## 2010-04-06 NOTE — Progress Notes (Signed)
   Phone Note Refill Request   Refills Requested: Medication #1:  DYAZIDE 37.5-25 MG  CAPS 1 tab by mouth daily in a.m. Summary of Call:   Follow-up for Phone Call        Rx denied because patient not seen in over a year and has not been reached after several attempts to schedule follow up.  Needs follow up before refills done. Follow-up by: Tereso Newcomer PA-C,  August 11, 2009 10:50 PM  Additional Follow-up for Phone Call Additional follow up Details #1::        melinda called pt and the customer you are trying to reach phone is off or their unavailable.... [facility] and let them know the pt needs appt before getting refill on any meds Additional Follow-up by: [place] Shannon,  August 12, 2009 12:29 PM

## 2010-04-06 NOTE — Assessment & Plan Note (Signed)
Summary: F/U HTN & MEDICATION CONCERNS///RJP  Medications Added FUROSEMIDE 20 MG  TABS (FUROSEMIDE) 1 tab by mouth daily as needed leg swelling.  Eat an orange the same day.      Allergies Added: NKDA  Vital Signs:  Patient Profile:   50 Years Old Female LMP:     07/03/2007 Height:     62 inches Weight:      250.5 pounds Temp:     97.0 degrees F oral Pulse rate:   76 / minute Pulse rhythm:   regular Resp:     16 per minute BP sitting:   142 / 88  (left arm) Cuff size:   large  Pt. in pain?   no  Vitals Entered By: Verdis Prime SMA (July 03, 2007 12:30 PM)  Menstrual History: LMP (date): 07/03/2007 LMP - Character: normal              Is Patient Diabetic? No  Does patient need assistance? Functional Status Self care Ambulation Normal     Chief Complaint:  pt compaining of aching in feet and legs at times, it gets worse when she is on her menstural period, and also with rainy weather and lots of walking.  History of Present Illness: 1.  Hypertension:  Headache today after eye appt--lots of drops in eyes, then developed headache.  Has not had bp checked elsewhere.  2.  Feet and legs hurt an ache regularly, though not every day.  Legs swell  at  least 3 days weekly.  Gets muscle spasms in arch of foot.  Has been occurring for past 4 months.  Daughter hit right ankle with skateboard--mid February.  Sometimes that area can ache when walking.  Eats a lot of soups, eats chips, does drink soda, but not necessarily every day.  Does not add salt to foods.  No dyspnea with the swelling    Current Allergies: No known allergies       Physical Exam  Extremities:     Does have mild varicosities of bilateral lower extrems.  No obvious swelling currently.  Pt. morbidly obese.  NT about right ankle currently.  No joint effusion or erythema.    Impression & Recommendations:  Problem # 1:  PERIPHERAL EDEMA (ICD-782.3) Leg pain Discussed elements of sodium intake,  gravity, varicosities and weight and needs to work on each. Exercising 3 x weekly--to stretch out legs after a warm up.  Encouraged water exercises, which she is looking into. Furosemide as needed swelling of legs--too hot for compression stockings at this time and pt. outside a lot To elevate legs in reclined position when sitting.  Move if on feet--no standing still. Her updated medication list for this problem includes:    Hydrochlorothiazide 25 Mg Tabs (Hydrochlorothiazide) .Marland Kitchen... 1 tab by mouth daily    Furosemide 20 Mg Tabs (Furosemide) .Marland Kitchen... 1 tab by mouth daily as needed leg swelling.  eat an orange the same day.   Problem # 2:  HYPERTENSION (ICD-401.9) Borederline--bp check in 2 weeks. Her updated medication list for this problem includes:    Monopril 40 Mg Tabs (Fosinopril sodium) .Marland Kitchen... 1/2 tab by mouth daily    Hydrochlorothiazide 25 Mg Tabs (Hydrochlorothiazide) .Marland Kitchen... 1 tab by mouth daily    Furosemide 20 Mg Tabs (Furosemide) .Marland Kitchen... 1 tab by mouth daily as needed leg swelling.  eat an orange the same day.   Complete Medication List: 1)  Zyrtec Allergy 10 Mg Tabs (Cetirizine hcl) .Marland Kitchen.. 1 tab by mouth daily  2)  Singulair 10 Mg Tabs (Montelukast sodium) .Marland Kitchen.. 1 tab by mouth daily 3)  Paxil 10 Mg Tabs (Paroxetine hcl) .Marland Kitchen.. 1 tab by mouth daily 4)  Monopril 40 Mg Tabs (Fosinopril sodium) .... 1/2 tab by mouth daily 5)  Ferrous Sulfate 325 (65 Fe) Mg Tabs (Ferrous sulfate) .Marland Kitchen.. 1 tab by mouth daily 6)  Protonix 40 Mg Tbec (Pantoprazole sodium) .Marland Kitchen.. 1 tab by mouth daily 7)  Hydrochlorothiazide 25 Mg Tabs (Hydrochlorothiazide) .Marland Kitchen.. 1 tab by mouth daily 8)  Albuterol 90 Mcg/act Aers (Albuterol) .... 2 puffs q 4h prn 9)  Advair Diskus 250-50 Mcg/dose Misc (Fluticasone-salmeterol) .Marland Kitchen.. 1 inhalation two times a day 10)  Naprosyn 500 Mg Tabs (Naproxen) .Marland Kitchen.. 1 tablet by mouth two times a day as needed 11)  Furosemide 20 Mg Tabs (Furosemide) .Marland Kitchen.. 1 tab by mouth daily as needed leg swelling.   eat an orange the same day.   Patient Instructions: 1)  Move if on feet--no standing still. 2)  elevate feet as much as possible in reclined position 3)  Check food labels for sodium content--stay away from chips, sodas, canned soups--rinse off canned veggies. 4)  Take Furosemide on days you have swelling--make sure you eat and orange the same day. 5)  Blood pressure check and BMET in 2 weeks--nurse visit.    Prescriptions: FUROSEMIDE 20 MG  TABS (FUROSEMIDE) 1 tab by mouth daily as needed leg swelling.  Eat an orange the same day.  #30 x 1   Entered and Authorized by:   Julieanne Manson MD   Signed by:   Julieanne Manson MD on 07/03/2007   Method used:   Print then Give to Patient   RxID:   [id number]  ]

## 2010-04-06 NOTE — Progress Notes (Signed)
Summary: iron refill   Phone Note Call from Patient Call back at Home Phone [phone]   Caller: Patient Summary of Call: the patient needs more medical refills from Ferrous 325 mg. [facility] ([place]). Dr. Delrae Alfred Initial call taken by: Manon Hilding,  March 28, 2007 5:10 PM  Follow-up for Phone Call        Pt requesting refill on her ferrous sulfate 325mg .  Last filled in december.  [facility] [place] Follow-up by: Vesta Mixer CMA,  March 29, 2007 11:45 AM      Prescriptions: FERROUS SULFATE 325 (65 FE) MG  TABS (FERROUS SULFATE) 1 tab by mouth daily  #30 x 5   Entered and Authorized by:   Julieanne Manson MD   Signed by:   Julieanne Manson MD on 04/01/2007   Method used:   Electronically sent to ...       [facility] [address] [phone]*       [address] [place], [place]  [address]       Ph: [phone]       Fax: [phone]   RxID:   [id number]

## 2010-04-06 NOTE — Letter (Signed)
Summary: RECORDS FROM [facility]  RECORDS FROM [facility]   Imported By: Arta Bruce 09/19/2008 12:47:44  _____________________________________________________________________  External Attachment:    Type:   Image     Comment:   External Document

## 2010-04-06 NOTE — Progress Notes (Signed)
Summary: refill iron  Phone Note Call from Patient   Caller: Patient Summary of Call: The patient told me that she receive a call from here but unfortunately, she couldn't respond at the time.  She wants the physician prenscribed her some iron pills if that is possible .  [employer]. [phone] Initial call taken by: Manon Hilding,  December 20, 2006 9:13 AM  Follow-up for Phone Call        Need paper chart--no history of anemia or of being on iron in EMR.  History of iron deficiency in paper chart from 01/07/07 to continue ferrous sulfate and recheck CBC in 6 weeks.  Please notify pt. and fax Rx to GP. Follow-up by: Julieanne Manson MD,  December 21, 2006 9:10 AM  Additional Follow-up for Phone Call Additional follow up Details #1::        no answer, script faxed to pharm Additional Follow-up by: Gaylyn Cheers RN,  December 21, 2006 5:13 PM  New Problems: ANEMIA, IRON DEFICIENCY (ICD-280.9)   New Problems: ANEMIA, IRON DEFICIENCY (ICD-280.9) New/Updated Medications: FERROUS SULFATE 325 (65 FE) MG  TABS (FERROUS SULFATE) 1 tab by mouth daily   Prescriptions: FERROUS SULFATE 325 (65 FE) MG  TABS (FERROUS SULFATE) 1 tab by mouth daily  #30 x 2   Entered and Authorized by:   Julieanne Manson MD   Signed by:   Julieanne Manson MD on 12/21/2006   Method used:   Print then Give to Patient   RxID:   [id number]

## 2010-04-06 NOTE — Progress Notes (Signed)
Summary: NEEDS ALL MEDS REFILLED  Phone Note Call from Patient Call back at Home Phone [phone]   Reason for Call: Refill Medication Summary of Call: DR. Delrae Alfred, Melissa SAYS THAT HER SINGULAIR IS NOT WORKING AND SHE WOULD LIKE ZYRTEC CALLED INTO OUR PHARMACY AND SHE SAYS THAT SHE NOW NEEDS REFILLS ON EVERYTHING ELSE. Initial call taken by: Leodis Rains,  November 09, 2006 5:05 PM  Follow-up for Phone Call        Attempted to call pt. to find out her specific concerns with Singulair and which meds need refilling--no answer [phone].  Please call again on Monday to get more information.  If the info is too in depth--make her an OV Follow-up by: Julieanne Manson MD,  November 10, 2006 1:06 PM  Additional Follow-up for Phone Call Additional follow up Details #1::        No Answer [phone] Additional Follow-up by: Vesta Mixer,  November 11, 2006 10:52 AM    Additional Follow-up for Phone Call Additional follow up Details #2::    Pt was sneezing/coughing last week, but is alot better this week.  Also when she went to get meds at pharmacy she had refills already on them.  So everything is taken care of  Follow-up by: Vesta Mixer CMA,  November 15, 2006 10:30 AM

## 2010-04-06 NOTE — Progress Notes (Signed)
Summary: PAXIL 10 MG  TABS (PAROXETINE HCL)  REFILL  Phone Note Call from Patient   Caller: Patient Reason for Call: Refill Medication Summary of Call: PT NEED A REFILL OF PAXIL 10 MG  TABS (PAROXETINE HCL) [facility]  [address] PT SAID THAT THE PHARMACY IS TRYING TO GET IN TOUCH WITH DR MULBERRY . PLEASE, CALL  HER AT  [phone]   THANK YOU . Initial call taken by: Cheryll Dessert,  October 24, 2007 12:27 PM  Follow-up for Phone Call        Forward to pcp for auth. Follow-up by: Vesta Mixer CMA,  October 24, 2007 12:31 PM  Additional Follow-up for Phone Call Additional follow up Details #1::        Please clarify that she is taking 1 tab daily    Additional Follow-up for Phone Call Additional follow up Details #2::    Left message on answering machine for pt to return call [phone] Follow-up by: Vesta Mixer CMA,  October 25, 2007 3:56 PM  Additional Follow-up for Phone Call Additional follow up Details #3:: Details for Additional Follow-up Action Taken: Left message on answering machine for pt to return call [phone]  Pt called back she is taking one a day she said. ........... Vesta Mixer CMA  October 30, 2007 12:37 PM  Additional Follow-up by: Vesta Mixer CMA,  October 30, 2007 10:59 AM    Prescriptions: PAXIL 10 MG  TABS (PAROXETINE HCL) 1 tab by mouth daily  #30 x 5   Entered and Authorized by:   Julieanne Manson MD   Signed by:   Julieanne Manson MD on 10/26/2007   Method used:   [facility]        [facility]  [address] [phone]* (retail)       [address]       [place], [place]  [address]       Ph: [phone] or [phone]       Fax: [phone]   RxID:   [id number] PAXIL 10 MG  TABS (PAROXETINE HCL) 1 tab by mouth daily  #30 x 5   Entered and Authorized by:   Julieanne Manson MD   Signed by:   Julieanne Manson MD on 10/24/2007   Method used:   Printed then faxed to ...       [facility]  (mail-order)       [address] [place], [place]  [address]       Ph: [phone]       Fax: [phone]   RxID:   [id number]  Wrong pharmacy with initial RX

## 2010-04-13 ENCOUNTER — Emergency Department (HOSPITAL_BASED_OUTPATIENT_CLINIC_OR_DEPARTMENT_OTHER)
Admission: EM | Admit: 2010-04-13 | Discharge: 2010-04-13 | Disposition: A | Discharge status: Home / Self Care | Payer: Medicare Other | Attending: Emergency Medicine | Admitting: Emergency Medicine

## 2010-04-13 DIAGNOSIS — R059 Cough, unspecified: Secondary | ICD-10-CM | POA: Insufficient documentation

## 2010-04-13 DIAGNOSIS — R635 Abnormal weight gain: Secondary | ICD-10-CM | POA: Insufficient documentation

## 2010-04-13 DIAGNOSIS — R6889 Other general symptoms and signs: Secondary | ICD-10-CM | POA: Insufficient documentation

## 2010-04-13 DIAGNOSIS — J3489 Other specified disorders of nose and nasal sinuses: Secondary | ICD-10-CM | POA: Insufficient documentation

## 2010-04-13 DIAGNOSIS — IMO0001 Reserved for inherently not codable concepts without codable children: Secondary | ICD-10-CM | POA: Insufficient documentation

## 2010-04-13 DIAGNOSIS — H9209 Otalgia, unspecified ear: Secondary | ICD-10-CM | POA: Insufficient documentation

## 2010-04-13 DIAGNOSIS — E119 Type 2 diabetes mellitus without complications: Secondary | ICD-10-CM | POA: Insufficient documentation

## 2010-04-13 DIAGNOSIS — E785 Hyperlipidemia, unspecified: Secondary | ICD-10-CM | POA: Insufficient documentation

## 2010-04-13 DIAGNOSIS — R197 Diarrhea, unspecified: Secondary | ICD-10-CM | POA: Insufficient documentation

## 2010-04-13 DIAGNOSIS — I10 Essential (primary) hypertension: Secondary | ICD-10-CM | POA: Insufficient documentation

## 2010-04-13 DIAGNOSIS — R05 Cough: Secondary | ICD-10-CM | POA: Insufficient documentation

## 2010-04-13 DIAGNOSIS — J45909 Unspecified asthma, uncomplicated: Secondary | ICD-10-CM | POA: Insufficient documentation

## 2010-04-13 LAB — CBC
HCT: 30.9 % — ABNORMAL LOW (ref 36.0–46.0)
Hemoglobin: 10.9 g/dL — ABNORMAL LOW (ref 12.0–15.0)
MCH: 28.5 pg (ref 26.0–34.0)
MCHC: 35.3 g/dL (ref 30.0–36.0)
MCV: 80.9 fL (ref 78.0–100.0)
Platelets: 285 10*3/uL (ref 150–400)
RBC: 3.82 MIL/uL — ABNORMAL LOW (ref 3.87–5.11)
RDW: 14.6 % (ref 11.5–15.5)
WBC: 8 10*3/uL (ref 4.0–10.5)

## 2010-04-13 LAB — BASIC METABOLIC PANEL
BUN: 15 mg/dL (ref 6–23)
CO2: 27 mEq/L (ref 19–32)
Calcium: 8.2 mg/dL — ABNORMAL LOW (ref 8.4–10.5)
Chloride: 109 mEq/L (ref 96–112)
Creatinine, Ser: 1.1 mg/dL (ref 0.4–1.2)
GFR calc Af Amer: >60 mL/min (ref >60)
GFR calc non Af Amer: 53 mL/min — ABNORMAL LOW (ref >60)
Glucose, Bld: 127 mg/dL — ABNORMAL HIGH (ref 70–99)
Potassium: 3.6 mEq/L (ref 3.5–5.1)
Sodium: 142 mEq/L (ref 135–145)

## 2010-04-13 LAB — DIFFERENTIAL
Basophils Absolute: 0 10*3/uL (ref 0.0–0.1)
Basophils Relative: 0 % (ref 0–1)
Eosinophils Absolute: 0.3 10*3/uL (ref 0.0–0.7)
Eosinophils Relative: 4 % (ref 0–5)
Lymphocytes Relative: 30 % (ref 12–46)
Lymphs Abs: 2.4 10*3/uL (ref 0.7–4.0)
Monocytes Absolute: 0.6 10*3/uL (ref 0.1–1.0)
Monocytes Relative: 8 % (ref 3–12)
Neutro Abs: 4.7 10*3/uL (ref 1.7–7.7)
Neutrophils Relative %: 59 % (ref 43–77)

## 2010-04-13 LAB — TSH: TSH: 0.795 u[IU]/mL (ref 0.350–4.500)

## 2010-04-13 LAB — CK: Total CK: 151 U/L (ref 7–177)

## 2010-05-12 ENCOUNTER — Emergency Department (HOSPITAL_BASED_OUTPATIENT_CLINIC_OR_DEPARTMENT_OTHER)
Admission: EM | Admit: 2010-05-12 | Discharge: 2010-05-12 | Disposition: A | Discharge status: Home / Self Care | Payer: Medicare Other | Attending: Emergency Medicine | Admitting: Emergency Medicine

## 2010-05-12 DIAGNOSIS — R0602 Shortness of breath: Secondary | ICD-10-CM | POA: Insufficient documentation

## 2010-05-12 DIAGNOSIS — Z9109 Other allergy status, other than to drugs and biological substances: Secondary | ICD-10-CM | POA: Insufficient documentation

## 2010-05-12 DIAGNOSIS — E785 Hyperlipidemia, unspecified: Secondary | ICD-10-CM | POA: Insufficient documentation

## 2010-05-12 DIAGNOSIS — J309 Allergic rhinitis, unspecified: Secondary | ICD-10-CM | POA: Insufficient documentation

## 2010-05-12 DIAGNOSIS — I10 Essential (primary) hypertension: Secondary | ICD-10-CM | POA: Insufficient documentation

## 2010-05-12 DIAGNOSIS — J45909 Unspecified asthma, uncomplicated: Secondary | ICD-10-CM | POA: Insufficient documentation

## 2010-05-12 DIAGNOSIS — E119 Type 2 diabetes mellitus without complications: Secondary | ICD-10-CM | POA: Insufficient documentation

## 2010-05-30 ENCOUNTER — Emergency Department (HOSPITAL_COMMUNITY)
Admission: EM | Admit: 2010-05-30 | Discharge: 2010-05-31 | Disposition: A | Discharge status: Home / Self Care | Payer: Medicare Other | Attending: Emergency Medicine | Admitting: Emergency Medicine

## 2010-05-30 DIAGNOSIS — Z0389 Encounter for observation for other suspected diseases and conditions ruled out: Secondary | ICD-10-CM | POA: Insufficient documentation

## 2010-05-30 DIAGNOSIS — E119 Type 2 diabetes mellitus without complications: Secondary | ICD-10-CM | POA: Insufficient documentation

## 2010-05-30 DIAGNOSIS — I10 Essential (primary) hypertension: Secondary | ICD-10-CM | POA: Insufficient documentation

## 2010-05-30 DIAGNOSIS — IMO0002 Reserved for concepts with insufficient information to code with codable children: Secondary | ICD-10-CM | POA: Insufficient documentation

## 2010-05-30 DIAGNOSIS — J45909 Unspecified asthma, uncomplicated: Secondary | ICD-10-CM | POA: Insufficient documentation

## 2010-05-31 LAB — BLOOD GAS, VENOUS
Acid-Base Excess: 2.4 mmol/L — ABNORMAL HIGH (ref 0.0–2.0)
Bicarbonate: 27.6 mEq/L — ABNORMAL HIGH (ref 20.0–24.0)
O2 Saturation: 29.3 %
Patient temperature: 98.6
TCO2: 25.2 mmol/L (ref 0–100)
pCO2, Ven: 47.1 mmHg (ref 45.0–50.0)
pH, Ven: 7.385 — ABNORMAL HIGH (ref 7.250–7.300)
pO2, Ven: 22 mmHg — CL (ref 30.0–45.0)

## 2010-07-23 NOTE — H&P (Signed)
[place]  Patient:    Melissa Chavez                      MRN: [id number] Adm. Date:  [id number] Attending:  Milagros Evener Dhami                         History and Physical  REASON FOR ADMISSION:  The patient is a 50 year old African-American female with long history of bipolar disorder. She is here involuntarily. Apparently, she has been decompensating. She has not been compliant with her medications. Upon admission, she was acutely psychotic and manic. At the present time, the patient is sedated from Thorazine she received early this morning but continues to have loose associations with pressured speech, and it is difficult to comprehend her conversation. She is unable to provide much information.  PAST PSYCHIATRIC HISTORY:  Long history of bipolar disorder as per information obtained in the paperwork from her mother. She has been to a state facility for  treatment of the same, and she is being followed by [facility]. I am not sure when was the last time she went there for follow-up.  SOCIAL HISTORY:  She is single. She claims she had education up to 14 1/2 years. She has two children, ages 57 and 8 1/2 months. She is being followed by Dr. Gwyndolyn Kaufman at [facility].  FAMILY HISTORY:  The patient is unaware of any family history. Claims that her mothers sister had autistic children, but this information needs to be confirmed.  ALCOHOL AND DRUG HISTORY:  The patient states she drinks socially. She denies any use of illegal drugs.  PAST MEDICAL HISTORY:  She sees Ammie Dalton, M.D. for her any medical problems. She does suffer from asthma and takes Maxair.  CURRENT MEDICATIONS:  Maxair and Lithium. The dose of lithium is not known.  DRUG ALLERGIES:  PENICILLIN.   PHYSICAL EXAMINATION:  GENERAL:  The patient is obese.  HEENT:  Head atraumatic, normocephalic. Extraocular movements were  intact. Sclerae were white. Pupils are equal, round, and reactive to light and accommodation. Fundi revealed normal disks. No hemorrhages or exudates. Face was symmetrical. Tongue and uvula in the midline with no deviation. There were no exudates in the pharynx. ars were clear. Tympanic membranes visualized clearly.  LUNGS:  Clear to auscultation.  HEART:  S1/S2 normal with no gallops or murmurs.  ABDOMEN:  Soft, nontender with no hepatosplenomegaly. It is obese.  EXTREMITIES:  Unremarkable.  NEUROLOGICAL:  Cranial nerves II through XII within normal limits. Strength is /5 bilaterally symmetrical. Sensory is normal to touch. Gait is within normal limits.  MENTAL STATUS EXAMINATION:  The patient was alert and oriented to time, place, nd person. Somewhat sedated but was able to provide above information. She does have pressured speech with loose associations. Thought processes are disorganized. She is certainly very delusional and paranoid. I was not able to complete former mental status examination.  IMPRESSION:   AXIS I. Bipolar disorder, recurrent episode severe, manic with psychotic           features.  AXIS II. Deferred. AXIS III. Asthma.  AXIS IV. Noncompliance.   AXIS V. 30/65.  PLAN:  I will admit her to 5000. I will treat her mania and psychosis. I will resume lithium. I will treat psychosis with Risperdal, and she will follow up at [facility] upon discharge. DD:  12/11/98  TD:  12/11/98 Job: [id number]

## 2010-09-22 ENCOUNTER — Encounter: Payer: Self-pay | Admitting: *Deleted

## 2010-09-22 ENCOUNTER — Emergency Department (HOSPITAL_BASED_OUTPATIENT_CLINIC_OR_DEPARTMENT_OTHER)
Admission: EM | Admit: 2010-09-22 | Discharge: 2010-09-22 | Disposition: A | Discharge status: Home / Self Care | Payer: Medicare Other | Attending: Emergency Medicine | Admitting: Emergency Medicine

## 2010-09-22 DIAGNOSIS — E669 Obesity, unspecified: Secondary | ICD-10-CM | POA: Insufficient documentation

## 2010-09-22 DIAGNOSIS — R51 Headache: Secondary | ICD-10-CM

## 2010-09-22 DIAGNOSIS — J45909 Unspecified asthma, uncomplicated: Secondary | ICD-10-CM | POA: Insufficient documentation

## 2010-09-22 DIAGNOSIS — I10 Essential (primary) hypertension: Secondary | ICD-10-CM

## 2010-09-22 DIAGNOSIS — R109 Unspecified abdominal pain: Secondary | ICD-10-CM

## 2010-09-22 HISTORY — DX: Unspecified abdominal hernia without obstruction or gangrene: K46.9

## 2010-09-22 HISTORY — DX: Essential (primary) hypertension: I10

## 2010-09-22 MED ORDER — ACETAMINOPHEN 325 MG PO TABS
975.0000 mg | ORAL_TABLET | Freq: Once | ORAL | Status: AC
  Administered 2010-09-22: 975 mg via ORAL

## 2010-09-22 MED ORDER — ACETAMINOPHEN 325 MG PO TABS
ORAL_TABLET | ORAL | Status: AC
  Filled 2010-09-22: qty 3

## 2010-09-22 NOTE — ED Provider Notes (Signed)
History     Chief Complaint  Patient presents with  . Headache   chief complaint headache Patient is a 50 y.o. female presenting with headaches.  Headache  Associated symptoms include nausea.   complains of headaches left-sided temporal typical of headaches she gets daily for several months. Ibuprofen this morning without relief also complains of slight abdominal pain at the site of her umbilical hernia scar for the past few hours note admits to nausea no vomiting no fever last bowel movement today, normal past medical history  Past Medical History  Diagnosis Date  . Hypertension   . Hernia   . Asthma     Past Surgical History  Procedure Date  . Hernia repair   . Tubal ligation   . Tonsillectomy     No family history on file.  History  Substance Use Topics  . Smoking status: Never Smoker   . Smokeless tobacco: Not on file  . Alcohol Use: No    OB History    Grav Para Term Preterm Abortions TAB SAB Ect Mult Living                  Review of Systems  Constitutional: Negative.   Respiratory: Negative.   Cardiovascular: Negative.   Gastrointestinal: Positive for nausea and abdominal pain.  Musculoskeletal: Negative.   Skin: Negative.   Neurological: Positive for headaches.  Hematological: Negative.   Psychiatric/Behavioral: Negative.     Physical Exam  BP 160/108  Pulse 95  Temp(Src) 98.7 F (37.1 C) (Oral)  Resp 18  Wt 205 lb (92.987 kg)  SpO2 100%  LMP 09/16/2010  Physical Exam  Nursing note and vitals reviewed. Constitutional: She is oriented to person, place, and time. She appears well-developed and well-nourished.  HENT:  Head: Normocephalic and atraumatic.  Eyes: Conjunctivae are normal. Pupils are equal, round, and reactive to light.  Neck: Neck supple. No tracheal deviation present. No thyromegaly present.  Cardiovascular: Normal rate and regular rhythm.   No murmur heard. Pulmonary/Chest: Effort normal and breath sounds normal.  Abdominal:  Soft. Bowel sounds are normal. She exhibits no distension. There is no tenderness.       Obese minimally tender over midline surgical scar-periumbilical no hernia present no redness warmth or swelling normal bowels  Musculoskeletal: Normal range of motion. She exhibits no edema and no tenderness.  Neurological: She is alert and oriented to person, place, and time. She has normal strength. No cranial nerve deficit. Gait normal. GCS eye subscore is 4. GCS verbal subscore is 5. GCS motor subscore is 6.  Skin: Skin is warm and dry. No rash noted.  Psychiatric: She has a normal mood and affect.    ED Course  Procedures  MDM In light of patient's driving home cannot offer I can offer her medication which will cause drowsiness. Tylenol in the emergency department. She requests a new primary care doctor [facility] suggest blood pressure recheck next 3 weeks Tylenol for pain      Doug Sou, MD 09/22/10 [id number]

## 2010-12-02 LAB — BASIC METABOLIC PANEL
BUN: 8
BUN: 10
CO2: 29
CO2: 25
Calcium: 8.6
Calcium: 9.3
Chloride: 101
Chloride: 103
Creatinine, Ser: 0.8
Creatinine, Ser: 1.1
GFR calc Af Amer: >60
GFR calc Af Amer: >60
GFR calc non Af Amer: >60
GFR calc non Af Amer: 53 — ABNORMAL LOW
Glucose, Bld: 232 — ABNORMAL HIGH
Glucose, Bld: 170 — ABNORMAL HIGH
Potassium: 3.1 — ABNORMAL LOW
Potassium: 3.4 — ABNORMAL LOW
Sodium: 137
Sodium: 140

## 2010-12-13 LAB — DIFFERENTIAL
Basophils Absolute: 0.1
Basophils Relative: 1
Eosinophils Absolute: 0.3
Eosinophils Relative: 3
Lymphocytes Relative: 25
Lymphs Abs: 2.4
Monocytes Absolute: 0.7
Monocytes Relative: 7
Neutro Abs: 6.2
Neutrophils Relative %: 64

## 2010-12-13 LAB — B-NATRIURETIC PEPTIDE (CONVERTED LAB): Pro B Natriuretic peptide (BNP): 84.6

## 2010-12-13 LAB — URINALYSIS, ROUTINE W REFLEX MICROSCOPIC
Bilirubin Urine: NEGATIVE
Glucose, UA: NEGATIVE
Hgb urine dipstick: NEGATIVE
Ketones, ur: NEGATIVE
Nitrite: NEGATIVE
Protein, ur: NEGATIVE
Specific Gravity, Urine: 1.016
Urobilinogen, UA: 0.2
pH: 6

## 2010-12-13 LAB — URINE MICROSCOPIC-ADD ON

## 2010-12-13 LAB — BASIC METABOLIC PANEL
BUN: 9
CO2: 29
Calcium: 8.8
Chloride: 105
Creatinine, Ser: 0.94
GFR calc Af Amer: >60
GFR calc non Af Amer: >60
Glucose, Bld: 121 — ABNORMAL HIGH
Potassium: 3.3 — ABNORMAL LOW
Sodium: 140

## 2010-12-13 LAB — CBC
HCT: 28.8 — ABNORMAL LOW
Hemoglobin: 10 — ABNORMAL LOW
MCHC: 34.9
MCV: 81.7
Platelets: 322
RBC: 3.52 — ABNORMAL LOW
RDW: 15.7 — ABNORMAL HIGH
WBC: 9.6

## 2012-04-26 ENCOUNTER — Ambulatory Visit (INDEPENDENT_AMBULATORY_CARE_PROVIDER_SITE_OTHER): Payer: Medicare Other | Admitting: Family Medicine

## 2012-04-26 VITALS — BP 153/88 | HR 92 | Temp 98.1°F | Resp 16 | Ht 62.0 in | Wt 219.0 lb

## 2012-04-26 DIAGNOSIS — J309 Allergic rhinitis, unspecified: Secondary | ICD-10-CM

## 2012-04-26 DIAGNOSIS — R351 Nocturia: Secondary | ICD-10-CM | POA: Diagnosis not present

## 2012-04-26 DIAGNOSIS — R8281 Pyuria: Secondary | ICD-10-CM

## 2012-04-26 DIAGNOSIS — R350 Frequency of micturition: Secondary | ICD-10-CM | POA: Diagnosis not present

## 2012-04-26 DIAGNOSIS — I10 Essential (primary) hypertension: Secondary | ICD-10-CM

## 2012-04-26 DIAGNOSIS — R82998 Other abnormal findings in urine: Secondary | ICD-10-CM

## 2012-04-26 DIAGNOSIS — J4540 Moderate persistent asthma, uncomplicated: Secondary | ICD-10-CM

## 2012-04-26 DIAGNOSIS — J45909 Unspecified asthma, uncomplicated: Secondary | ICD-10-CM

## 2012-04-26 LAB — POCT URINALYSIS DIPSTICK
Bilirubin, UA: NEGATIVE
Blood, UA: NEGATIVE
Glucose, UA: NEGATIVE
Ketones, UA: NEGATIVE
Nitrite, UA: NEGATIVE
Protein, UA: NEGATIVE
Spec Grav, UA: 1.015
Urobilinogen, UA: 0.2
pH, UA: 7

## 2012-04-26 LAB — POCT UA - MICROSCOPIC ONLY
Casts, Ur, LPF, POC: NEGATIVE
Crystals, Ur, HPF, POC: NEGATIVE
Yeast, UA: NEGATIVE

## 2012-04-26 MED ORDER — NITROFURANTOIN MONOHYD MACRO 100 MG PO CAPS: 100.0000 mg | ORAL_CAPSULE | Freq: Two times a day (BID) | ORAL | Status: DC

## 2012-04-26 MED ORDER — FLUTICASONE-SALMETEROL 250-50 MCG/DOSE IN AEPB: 1.0000 | INHALATION_SPRAY | Freq: Two times a day (BID) | RESPIRATORY_TRACT | Status: DC

## 2012-04-26 MED ORDER — ALBUTEROL SULFATE HFA 108 (90 BASE) MCG/ACT IN AERS: 2.0000 | INHALATION_SPRAY | Freq: Four times a day (QID) | RESPIRATORY_TRACT | Status: DC | PRN

## 2012-04-26 MED ORDER — MONTELUKAST SODIUM 10 MG PO TABS: 10.0000 mg | ORAL_TABLET | Freq: Every day | ORAL | Status: DC

## 2012-04-26 NOTE — Progress Notes (Signed)
Subjective:    Patient ID: Melissa Chavez, female    DOB: [birth year], 52 y.o.   MRN: [id number]  HPI Melissa Chavez is a 52 y.o. female  Hx of asthma - usually takes 250/50 advair twice per day, and albuterol as needed (usually uses about once per day), but recently feels like worse with more wheezing since yesterday.  . Using albuterol 2 puffs - three doses since yesterday.  Once this  At 6:30.  Feels a little better now.  No chest pain. . No fever.  Has had some congestion, runny, nose, slight ha, sinuses congested. No known sick contacts.  No flu vaccine this year, but no fever, no myalgias. Hx of allergic rhinitis - claritin or zyrtec as needed.    Nocturia/urinary frequency mostly at night - 4 times per night - past 2 weeks. Thought was hctz in blood pressure pill, so tried to stop blood pressure for a week, restarted this morning as no change in symptoms off this med.  Caffeine: coffee in morning at times, pepsi at times, sweet tea- 3 days a week. Does drink caffeinated drinks at dinner at times. No chocolate.  LNMP -1 week ago. No further bleeding.    Review of Systems  Constitutional: Negative for fever, chills, activity change, appetite change, fatigue and unexpected weight change.  Respiratory: Positive for cough, shortness of breath (with wheezing. ) and wheezing.   Gastrointestinal: Negative for abdominal pain and abdominal distention.  Genitourinary: Positive for frequency (nocturia.). Negative for dysuria, hematuria, flank pain, decreased urine volume, vaginal bleeding, vaginal discharge, enuresis, difficulty urinating and pelvic pain.       Objective:   Physical Exam  Vitals reviewed. Constitutional: She is oriented to person, place, and time. She appears well-developed and well-nourished. No distress.  HENT:  Head: Normocephalic and atraumatic.  Right Ear: Hearing, tympanic membrane, external ear and ear canal normal.  Left Ear: Hearing, tympanic membrane, external  ear and ear canal normal.  Nose: Rhinorrhea (minimal) present.  Mouth/Throat: Oropharynx is clear and moist. No oropharyngeal exudate.  Eyes: Conjunctivae and EOM are normal. Pupils are equal, round, and reactive to light.  Cardiovascular: Normal rate, regular rhythm, normal heart sounds and intact distal pulses.   No murmur heard. Pulmonary/Chest: Effort normal and breath sounds normal. No respiratory distress. She has no wheezes. She has no rhonchi.  Distant bs with body habitus, but no wheeze auscultated. Normal effort, no distress.    Abdominal: Soft. There is no tenderness. There is no CVA tenderness.  Neurological: She is alert and oriented to person, place, and time.  Skin: Skin is warm and dry. No rash noted.  Psychiatric: She has a normal mood and affect. Her behavior is normal.   Results for orders placed in visit on 04/26/12  POCT URINALYSIS DIPSTICK      Result Value Range   Color, UA lt yellow     Clarity, UA clear     Glucose, UA neg     Bilirubin, UA neg     Ketones, UA neg     Spec Grav, UA 1.015     Blood, UA neg     pH, UA 7.0     Protein, UA neg     Urobilinogen, UA 0.2     Nitrite, UA neg     Leukocytes, UA Trace    POCT UA - MICROSCOPIC ONLY      Result Value Range   WBC, Ur, HPF, POC 2-14  RBC, urine, microscopic 0-3     Bacteria, U Microscopic 1+     Mucus, UA trace     Epithelial cells, urine per micros 2-10     Crystals, Ur, HPF, POC neg     Casts, Ur, LPF, POC neg     Yeast, UA neg         Assessment & Plan:  Melissa Chavez is a 52 y.o. female Asthma, moderate persistent - Plan: albuterol (PROVENTIL HFA;VENTOLIN HFA) 108 (90 BASE) MCG/ACT inhaler, Fluticasone-Salmeterol (ADVAIR DISKUS) 250-50 MCG/DOSE AEPB, montelukast (SINGULAIR) 10 MG tablet  Allergic rhinitis - Plan: montelukast (SINGULAIR) 10 MG tablet  Urinary frequency - Plan: POCT urinalysis dipstick, POCT UA - Microscopic Only  Nocturia - Plan: POCT urinalysis dipstick, POCT UA -  Microscopic Only, Urine culture, nitrofurantoin, macrocrystal-monohydrate, (MACROBID) 100 MG capsule  HTN (hypertension)  Pyuria - Plan: Urine culture, nitrofurantoin, macrocrystal-monohydrate, (MACROBID) 100 MG capsule  Asthma - allergic component with increased symptoms recently, but clear in office. Can continue advair BID,  albuterol Q4-6h prn, add singulair, and zyrtec and recheck in next 3 days - sooner if needed, or if persistent albuterol dosing. Understanding expressed.   Nocturia - caffeine/nighttime po fluids likely contributory, but leukorrhea noted, with epi's - possible contaminant. Check urine cx, start macrobid, rtc precautions.   Patient Instructions  Decrease caffeine intake - especially in the afternoon or evening.  Your labs indicate a possible bladder infection, so start the antibiotic and see below.  We will check a urine culture to help in this testing. For your asthma, take albuterol up to every 4-6 hours as needed, continue advair, start singulair and zyrtec as discussed.  Recheck in the next 3 days if not improving - Return to the clinic or go to the nearest emergency room if any of your symptoms worsen or new symptoms occur. Urinary Tract Infection Urinary tract infections (UTIs) can develop anywhere along your urinary tract. Your urinary tract is your body's drainage system for removing wastes and extra water. Your urinary tract includes two kidneys, two ureters, a bladder, and a urethra. Your kidneys are a pair of bean-shaped organs. Each kidney is about the size of your fist. They are located below your ribs, one on each side of your spine. CAUSES Infections are caused by microbes, which are microscopic organisms, including fungi, viruses, and bacteria. These organisms are so small that they can only be seen through a microscope. Bacteria are the microbes that most commonly cause UTIs. SYMPTOMS  Symptoms of UTIs may vary by age and gender of the patient and by the  location of the infection. Symptoms in young women typically include a frequent and intense urge to urinate and a painful, burning feeling in the bladder or urethra during urination. Older women and men are more likely to be tired, shaky, and weak and have muscle aches and abdominal pain. A fever may mean the infection is in your kidneys. Other symptoms of a kidney infection include pain in your back or sides below the ribs, nausea, and vomiting. DIAGNOSIS To diagnose a UTI, your caregiver will ask you about your symptoms. Your caregiver also will ask to provide a urine sample. The urine sample will be tested for bacteria and white blood cells. White blood cells are made by your body to help fight infection. TREATMENT  Typically, UTIs can be treated with medication. Because most UTIs are caused by a bacterial infection, they usually can be treated with the use of antibiotics. The choice  of antibiotic and length of treatment depend on your symptoms and the type of bacteria causing your infection. HOME CARE INSTRUCTIONS  If you were prescribed antibiotics, take them exactly as your caregiver instructs you. Finish the medication even if you feel better after you have only taken some of the medication.  Drink enough water and fluids to keep your urine clear or pale yellow.  Avoid caffeine, tea, and carbonated beverages. They tend to irritate your bladder.  Empty your bladder often. Avoid holding urine for long periods of time.  Empty your bladder before and after sexual intercourse.  After a bowel movement, women should cleanse from front to back. Use each tissue only once. SEEK MEDICAL CARE IF:   You have back pain.  You develop a fever.  Your symptoms do not begin to resolve within 3 days. SEEK IMMEDIATE MEDICAL CARE IF:   You have severe back pain or lower abdominal pain.  You develop chills.  You have nausea or vomiting.  You have continued burning or discomfort with urination. MAKE  SURE YOU:   Understand these instructions.  Will watch your condition.  Will get help right away if you are not doing well or get worse. Document Released: 12/01/2004 Document Revised: 08/23/2011 Document Reviewed: 04/01/2011 [facility] Patient Information 2013 [place], [place].

## 2012-04-26 NOTE — Patient Instructions (Addendum)
Decrease caffeine intake - especially in the afternoon or evening.  Your labs indicate a possible bladder infection, so start the antibiotic and see below.  We will check a urine culture to help in this testing. For your asthma, take albuterol up to every 4-6 hours as needed, continue advair, start singulair and zyrtec as discussed.  Recheck in the next 3 days if not improving - Return to the clinic or go to the nearest emergency room if any of your symptoms worsen or new symptoms occur. Urinary Tract Infection Urinary tract infections (UTIs) can develop anywhere along your urinary tract. Your urinary tract is your body's drainage system for removing wastes and extra water. Your urinary tract includes two kidneys, two ureters, a bladder, and a urethra. Your kidneys are a pair of bean-shaped organs. Each kidney is about the size of your fist. They are located below your ribs, one on each side of your spine. CAUSES Infections are caused by microbes, which are microscopic organisms, including fungi, viruses, and bacteria. These organisms are so small that they can only be seen through a microscope. Bacteria are the microbes that most commonly cause UTIs. SYMPTOMS  Symptoms of UTIs may vary by age and gender of the patient and by the location of the infection. Symptoms in young women typically include a frequent and intense urge to urinate and a painful, burning feeling in the bladder or urethra during urination. Older women and men are more likely to be tired, shaky, and weak and have muscle aches and abdominal pain. A fever may mean the infection is in your kidneys. Other symptoms of a kidney infection include pain in your back or sides below the ribs, nausea, and vomiting. DIAGNOSIS To diagnose a UTI, your caregiver will ask you about your symptoms. Your caregiver also will ask to provide a urine sample. The urine sample will be tested for bacteria and white blood cells. White blood cells are made by your  body to help fight infection. TREATMENT  Typically, UTIs can be treated with medication. Because most UTIs are caused by a bacterial infection, they usually can be treated with the use of antibiotics. The choice of antibiotic and length of treatment depend on your symptoms and the type of bacteria causing your infection. HOME CARE INSTRUCTIONS  If you were prescribed antibiotics, take them exactly as your caregiver instructs you. Finish the medication even if you feel better after you have only taken some of the medication.  Drink enough water and fluids to keep your urine clear or pale yellow.  Avoid caffeine, tea, and carbonated beverages. They tend to irritate your bladder.  Empty your bladder often. Avoid holding urine for long periods of time.  Empty your bladder before and after sexual intercourse.  After a bowel movement, women should cleanse from front to back. Use each tissue only once. SEEK MEDICAL CARE IF:   You have back pain.  You develop a fever.  Your symptoms do not begin to resolve within 3 days. SEEK IMMEDIATE MEDICAL CARE IF:   You have severe back pain or lower abdominal pain.  You develop chills.  You have nausea or vomiting.  You have continued burning or discomfort with urination. MAKE SURE YOU:   Understand these instructions.  Will watch your condition.  Will get help right away if you are not doing well or get worse. Document Released: 12/01/2004 Document Revised: 08/23/2011 Document Reviewed: 04/01/2011 [facility] Patient Information 2013 [place], [place].

## 2012-04-28 LAB — URINE CULTURE: Colony Count: >=100000

## 2012-06-08 ENCOUNTER — Ambulatory Visit (INDEPENDENT_AMBULATORY_CARE_PROVIDER_SITE_OTHER): Payer: Medicare Other | Admitting: Family Medicine

## 2012-06-08 ENCOUNTER — Ambulatory Visit: Payer: Medicare Other

## 2012-06-08 VITALS — BP 168/92 | HR 101 | Temp 98.2°F | Resp 18 | Ht 62.0 in | Wt 219.0 lb

## 2012-06-08 DIAGNOSIS — M25569 Pain in unspecified knee: Secondary | ICD-10-CM | POA: Diagnosis not present

## 2012-06-08 DIAGNOSIS — Z8639 Personal history of other endocrine, nutritional and metabolic disease: Secondary | ICD-10-CM

## 2012-06-08 DIAGNOSIS — Z862 Personal history of diseases of the blood and blood-forming organs and certain disorders involving the immune mechanism: Secondary | ICD-10-CM

## 2012-06-08 DIAGNOSIS — M1710 Unilateral primary osteoarthritis, unspecified knee: Secondary | ICD-10-CM

## 2012-06-08 DIAGNOSIS — N318 Other neuromuscular dysfunction of bladder: Secondary | ICD-10-CM

## 2012-06-08 DIAGNOSIS — M25561 Pain in right knee: Secondary | ICD-10-CM

## 2012-06-08 DIAGNOSIS — M179 Osteoarthritis of knee, unspecified: Secondary | ICD-10-CM

## 2012-06-08 DIAGNOSIS — IMO0002 Reserved for concepts with insufficient information to code with codable children: Secondary | ICD-10-CM

## 2012-06-08 DIAGNOSIS — M238X1 Other internal derangements of right knee: Secondary | ICD-10-CM

## 2012-06-08 DIAGNOSIS — N3281 Overactive bladder: Secondary | ICD-10-CM

## 2012-06-08 DIAGNOSIS — R7303 Prediabetes: Secondary | ICD-10-CM

## 2012-06-08 DIAGNOSIS — R351 Nocturia: Secondary | ICD-10-CM

## 2012-06-08 DIAGNOSIS — M238X9 Other internal derangements of unspecified knee: Secondary | ICD-10-CM

## 2012-06-08 DIAGNOSIS — R7309 Other abnormal glucose: Secondary | ICD-10-CM

## 2012-06-08 LAB — POCT URINALYSIS DIPSTICK
Bilirubin, UA: NEGATIVE
Blood, UA: NEGATIVE
Glucose, UA: NEGATIVE
Leukocytes, UA: NEGATIVE
Nitrite, UA: NEGATIVE
Protein, UA: 30
Spec Grav, UA: 1.02
Urobilinogen, UA: 1
pH, UA: 5.5

## 2012-06-08 LAB — POCT UA - MICROSCOPIC ONLY
Casts, Ur, LPF, POC: NEGATIVE
Crystals, Ur, HPF, POC: NEGATIVE
Mucus, UA: NEGATIVE
Yeast, UA: NEGATIVE

## 2012-06-08 LAB — GLUCOSE, POCT (MANUAL RESULT ENTRY): POC Glucose: 98 mg/dl (ref 70–99)

## 2012-06-08 LAB — POCT GLYCOSYLATED HEMOGLOBIN (HGB A1C): Hemoglobin A1C: 6.4

## 2012-06-08 MED ORDER — DICLOFENAC SODIUM 75 MG PO TBEC: 75.0000 mg | DELAYED_RELEASE_TABLET | Freq: Two times a day (BID) | ORAL | Status: DC

## 2012-06-08 MED ORDER — OXYBUTYNIN CHLORIDE ER 5 MG PO TB24: 5.0000 mg | ORAL_TABLET | Freq: Every day | ORAL | Status: DC

## 2012-06-08 NOTE — Progress Notes (Signed)
Subjective: 52 year old lady who 5 years ago had fallen and hurt her right knee. She has not been having any knee problems for about 3 years. Last 3 days or so she's had pain in the right knee with swelling of the joint. She knows of no specific injury nor of any activity she might have done would've triggered it. She takes her kids places and she does her housework, but she is not employed. She does some volunteering. However none of these activities do things that she thinks might strained her knee. She hurts up into the thigh a little bit also.  She's had some nocturia, going to the bathroom about 3 times every night. She was here a couple of months ago and her urinalysis at that time had a few pus cells in it, but the culture did not grow any specific bacteria. She has had an elevated sugar in the past after drinking a sweetened beverage, but is not known to be diabetic.  Objective: Pleasant lady, whom overweight, in no major acute distress. Her right knee is visibly swollen she is tender in the right knee around the parameter of the patella. No obvious ligamentous laxity. It does hurt to flex the knee. No major crepitance was felt.  She has no CVA tenderness. Abdomen was soft without masses or tenderness.  Assessment: Right knee pain Nocturia  Plan: Urinalysis, hemoglobin A1c, glucose, x-ray knee.  [facility] reading (PRIMARY) by  Dr. Alwyn Ren Soft tissue calcification , probably benign, will see what radiologist says.  Poss. Old patellar fx. DJD knee   Results for orders placed in visit on 06/08/12  GLUCOSE, POCT (MANUAL RESULT ENTRY)      Result Value Range   POC Glucose 98  70 - 99 mg/dl  POCT GLYCOSYLATED HEMOGLOBIN (HGB A1C)      Result Value Range   Hemoglobin A1C 6.4    POCT UA - MICROSCOPIC ONLY      Result Value Range   WBC, Ur, HPF, POC 2-4     RBC, urine, microscopic 0-2     Bacteria, U Microscopic TRACE     Mucus, UA NEG     Epithelial cells, urine per micros 2-8     Crystals, Ur, HPF, POC NEG     Casts, Ur, LPF, POC NEG     Yeast, UA NEG    POCT URINALYSIS DIPSTICK      Result Value Range   Color, UA AMBER     Clarity, UA CLOUDY     Glucose, UA NEG     Bilirubin, UA NEG     Ketones, UA TRACE     Spec Grav, UA 1.020     Blood, UA NEG     pH, UA 5.5     Protein, UA 30     Urobilinogen, UA 1.0     Nitrite, UA NEG     Leukocytes, UA Negative     .

## 2012-06-08 NOTE — Patient Instructions (Addendum)
   Work hard on cutting back on your diet  Stop all liquid calories. Drink water  Eat less carbohydrates at meals, especially cutting back on breads, postures, potatoes, rice and other grains. Use whole-wheat breads.  Decrease portions.  Fill up on fruits and vegetables.  Snack on fruits or vegetables.  Take diclofenac one twice daily with food for the knee. Do not take any ibuprofen or Aleve on this medicine.  Purchased a neoprene knee splint that has the hole in the middle of it.  Take the Ditropan one tablet at bedtime to try and reduce nighttime urination.  If the knee is not doing better return in a week or so which time we probably need to inject it. I will let you know if the radiologist makes any other significant comments on the x-rays.

## 2012-07-25 ENCOUNTER — Other Ambulatory Visit: Payer: Self-pay | Admitting: Family Medicine

## 2012-10-11 ENCOUNTER — Telehealth: Payer: Self-pay

## 2012-10-11 ENCOUNTER — Other Ambulatory Visit: Payer: Self-pay | Admitting: Family Medicine

## 2013-02-08 NOTE — Telephone Encounter (Signed)
No message

## 2013-06-13 ENCOUNTER — Ambulatory Visit (INDEPENDENT_AMBULATORY_CARE_PROVIDER_SITE_OTHER): Payer: Medicare Other | Admitting: Emergency Medicine

## 2013-06-13 VITALS — BP 160/112 | HR 99 | Temp 98.1°F | Resp 16 | Ht 60.5 in | Wt 227.8 lb

## 2013-06-13 DIAGNOSIS — R351 Nocturia: Secondary | ICD-10-CM

## 2013-06-13 DIAGNOSIS — Z6841 Body Mass Index (BMI) 40.0 and over, adult: Secondary | ICD-10-CM | POA: Insufficient documentation

## 2013-06-13 DIAGNOSIS — J309 Allergic rhinitis, unspecified: Secondary | ICD-10-CM | POA: Insufficient documentation

## 2013-06-13 DIAGNOSIS — I10 Essential (primary) hypertension: Secondary | ICD-10-CM | POA: Diagnosis not present

## 2013-06-13 DIAGNOSIS — J4540 Moderate persistent asthma, uncomplicated: Secondary | ICD-10-CM

## 2013-06-13 DIAGNOSIS — N318 Other neuromuscular dysfunction of bladder: Secondary | ICD-10-CM

## 2013-06-13 DIAGNOSIS — N3281 Overactive bladder: Secondary | ICD-10-CM

## 2013-06-13 DIAGNOSIS — J45909 Unspecified asthma, uncomplicated: Secondary | ICD-10-CM

## 2013-06-13 MED ORDER — LORATADINE 10 MG PO TABS: 10.0000 mg | ORAL_TABLET | Freq: Every day | ORAL | Status: DC

## 2013-06-13 MED ORDER — FLUTICASONE-SALMETEROL 250-50 MCG/DOSE IN AEPB: 1.0000 | INHALATION_SPRAY | Freq: Two times a day (BID) | RESPIRATORY_TRACT | Status: DC

## 2013-06-13 MED ORDER — ALBUTEROL SULFATE HFA 108 (90 BASE) MCG/ACT IN AERS: 2.0000 | INHALATION_SPRAY | RESPIRATORY_TRACT | Status: DC | PRN

## 2013-06-13 MED ORDER — OXYBUTYNIN CHLORIDE ER 5 MG PO TB24: 5.0000 mg | ORAL_TABLET | Freq: Every day | ORAL | Status: DC

## 2013-06-13 MED ORDER — LISINOPRIL-HYDROCHLOROTHIAZIDE 20-12.5 MG PO TABS: 1.0000 | ORAL_TABLET | Freq: Every day | ORAL | Status: DC

## 2013-06-13 MED ORDER — MONTELUKAST SODIUM 10 MG PO TABS: 10.0000 mg | ORAL_TABLET | Freq: Every day | ORAL | Status: DC

## 2013-06-13 NOTE — Progress Notes (Signed)
[facility] [address], [place] [id number] [phone]  Date:  06/13/2013   Name:  Melissa Chavez   DOB:  08/13/1960   MRN:  [id number]  PCP:  Julieanne MansonMULBERRY,ELIZABETH, MD    Chief Complaint: Allergies   History of Present Illness:  Melissa Chavez is a 53 y.o. very pleasant female patient who presents with the following:  History of asthma and allergies and is experiencing worsening symptoms now.  Has clear nasal drainage and post nasal drip.  Cough and wheezing. Little clear sputum.  No fever or chills.  No nausea or vomiting. Has hypertension but not taking medication (ran out) for three months.  No improvement with over the counter medications or other home remedies.  11/15/1960   Patient Active Problem List   Diagnosis Date Noted  . BIPOLAR DISORDER UNSPECIFIED 03/18/2008  . VAGINAL DISCHARGE 10/05/2007  . HYPOKALEMIA 08/24/2007  . PERIPHERAL EDEMA 07/03/2007  . CONTUSION, RIGHT FOOT 04/23/2007  . GERD 01/30/2007  . HYPERCHOLESTEROLEMIA 12/07/2006  . HYPERTENSION 12/07/2006  . RHINITIS, ALLERGIC NEC 12/07/2006  . ASTHMA 12/07/2006  . KNEE PAIN, RIGHT 12/07/2006  . ANEMIA, IRON DEFICIENCY 01/06/2006    Past Medical History  Diagnosis Date  . Hypertension   . Hernia   . Asthma   . Allergy   . Glaucoma     Past Surgical History  Procedure Laterality Date  . Hernia repair    . Tubal ligation    . Tonsillectomy      History  Substance Use Topics  . Smoking status: Never Smoker   . Smokeless tobacco: Not on file  . Alcohol Use: No    Family History  Problem Relation Age of Onset  . Hypertension Mother   . Diabetes Mother   . Hypertension Father     Allergies  Allergen Reactions  . Fish Allergy Anaphylaxis  . Banana Other (See Comments)    unknown    Medication list has been reviewed and updated.  Current Outpatient Prescriptions on File Prior to Visit  Medication Sig Dispense Refill  . triamterene-hydrochlorothiazide  (MAXZIDE-25) 37.5-25 MG per tablet Take 1 tablet by mouth daily. PATIENT NEEDS OFFICE VISIT FOR ADDITIONAL REFILLS  30 tablet  0  . diclofenac (VOLTAREN) 75 MG EC tablet Take 1 tablet (75 mg total) by mouth 2 (two) times daily.  30 tablet  1  . nitrofurantoin, macrocrystal-monohydrate, (MACROBID) 100 MG capsule Take 1 capsule (100 mg total) by mouth 2 (two) times daily.  14 capsule  0  . oxybutynin (DITROPAN XL) 5 MG 24 hr tablet Take 1 tablet (5 mg total) by mouth daily.  30 tablet  3   No current facility-administered medications on file prior to visit.    Review of Systems:  As per HPI, otherwise negative.    Physical Examination: Filed Vitals:   06/13/13 0846  BP: 160/112  Pulse: 99  Temp: 98.1 F (36.7 C)  Resp: 16   Filed Vitals:   06/13/13 0846  Height: 5' 0.5" (1.537 m)  Weight: 227 lb 12.8 oz (103.329 kg)   Body mass index is 43.74 kg/(m^2). Ideal Body Weight: Weight in (lb) to have BMI = 25: 129.9  GEN: WDWN, NAD, Non-toxic, A & O x 3 HEENT: Atraumatic, Normocephalic. Neck supple. No masses, No LAD. Ears and Nose: No external deformity. CV: RRR, No M/G/R. No JVD. No thrill. No extra heart sounds. PULM: CTA B, no wheezes, crackles, rhonchi. No retractions. No resp. distress. No  accessory muscle use. ABD: S, NT, ND, +BS. No rebound. No HSM. EXTR: No c/c/e NEURO Normal gait.  PSYCH: Normally interactive. Conversant. Not depressed or anxious appearing.  Calm demeanor.    Assessment and Plan: Hypertension not compliant Morbid obesity Asthma not compliant Allergies Add lisinopril 20-12.5 Stop HCTZ-triamterene Refill meds   Signed,  Phillips Odor, MD

## 2013-06-13 NOTE — Patient Instructions (Signed)
Asthma, Adult Asthma is a recurring condition in which the airways tighten and narrow. Asthma can make it difficult to breathe. It can cause coughing, wheezing, and shortness of breath. Asthma episodes (also called asthma attacks) range from minor to life-threatening. Asthma cannot be cured, but medicines and lifestyle changes can help control it. CAUSES Asthma is believed to be caused by inherited (genetic) and environmental factors, but its exact cause is unknown. Asthma may be triggered by allergens, lung infections, or irritants in the air. Asthma triggers are different for each person. Common triggers include:   Animal dander.  Dust mites.  Cockroaches.  Pollen from trees or grass.  Mold.  Smoke.  Air pollutants such as dust, household cleaners, hair sprays, aerosol sprays, paint fumes, strong chemicals, or strong odors.  Cold air, weather changes, and winds (which increase molds and pollens in the air).  Strong emotional expressions such as crying or laughing hard.  Stress.  Certain medicines (such as aspirin) or types of drugs (such as beta-blockers).  Sulfites in foods and drinks. Foods and drinks that may contain sulfites include dried fruit, potato chips, and sparkling grape juice.  Infections or inflammatory conditions such as the flu, a cold, or an inflammation of the nasal membranes (rhinitis).  Gastroesophageal reflux disease (GERD).  Exercise or strenuous activity. SYMPTOMS Symptoms may occur immediately after asthma is triggered or many hours later. Symptoms include:  Wheezing.  Excessive nighttime or early morning coughing.  Frequent or severe coughing with a common cold.  Chest tightness.  Shortness of breath. DIAGNOSIS  The diagnosis of asthma is made by a review of your medical history and a physical exam. Tests may also be performed. These may include:  Lung function studies. These tests show how much air you breath in and out.  Allergy  tests.  Imaging tests such as X-rays. TREATMENT  Asthma cannot be cured, but it can usually be controlled. Treatment involves identifying and avoiding your asthma triggers. It also involves medicines. There are 2 classes of medicine used for asthma treatment:   Controller medicines. These prevent asthma symptoms from occurring. They are usually taken every day.  Reliever or rescue medicines. These quickly relieve asthma symptoms. They are used as needed and provide short-term relief. Your health care provider will help you create an asthma action plan. An asthma action plan is a written plan for managing and treating your asthma attacks. It includes a list of your asthma triggers and how they may be avoided. It also includes information on when medicines should be taken and when their dosage should be changed. An action plan may also involve the use of a device called a peak flow meter. A peak flow meter measures how well the lungs are working. It helps you monitor your condition. HOME CARE INSTRUCTIONS   Take medicine as directed by your health care provider. Speak with your health care provider if you have questions about how or when to take the medicines.  Use a peak flow meter as directed by your health care provider. Record and keep track of readings.  Understand and use the action plan to help minimize or stop an asthma attack without needing to seek medical care.  Control your home environment in the following ways to help prevent asthma attacks:  Do not smoke. Avoid being exposed to secondhand smoke.  Change your heating and air conditioning filter regularly.  Limit your use of fireplaces and wood stoves.  Get rid of pests (such as roaches and   mice) and their droppings.  Throw away plants if you see mold on them.  Clean your floors and dust regularly. Use unscented cleaning products.  Try to have someone else vacuum for you regularly. Stay out of rooms while they are being  vacuumed and for a short while afterward. If you vacuum, use a dust mask from a hardware store, a double-layered or microfilter vacuum cleaner bag, or a vacuum cleaner with a HEPA filter.  Replace carpet with wood, tile, or vinyl flooring. Carpet can trap dander and dust.  Use allergy-proof pillows, mattress covers, and box spring covers.  Wash bed sheets and blankets every week in hot water and dry them in a dryer.  Use blankets that are made of polyester or cotton.  Clean bathrooms and kitchens with bleach. If possible, have someone repaint the walls in these rooms with mold-resistant paint. Keep out of the rooms that are being cleaned and painted.  Wash hands frequently. SEEK MEDICAL CARE IF:   You have wheezing, shortness of breath, or a cough even if taking medicine to prevent attacks.  The colored mucus you cough up (sputum) is thicker than usual.  Your sputum changes from clear or white to yellow, green, gray, or bloody.  You have any problems that may be related to the medicines you are taking (such as a rash, itching, swelling, or trouble breathing).  You are using a reliever medicine more than 2 3 times per week.  Your peak flow is still at 50 79% of you personal best after following your action plan for 1 hour. SEEK IMMEDIATE MEDICAL CARE IF:   You seem to be getting worse and are unresponsive to treatment during an asthma attack.  You are short of breath even at rest.  You get short of breath when doing very little physical activity.  You have difficulty eating, drinking, or talking due to asthma symptoms.  You develop chest pain.  You develop a fast heartbeat.  You have a bluish color to your lips or fingernails.  You are lightheaded, dizzy, or faint.  Your peak flow is less than 50% of your personal best.  You have a fever or persistent symptoms for more than 2 3 days.  You have a fever and symptoms suddenly get worse. MAKE SURE YOU:   Understand these  instructions.  Will watch your condition.  Will get help right away if you are not doing well or get worse. Document Released: 02/21/2005 Document Revised: 10/24/2012 Document Reviewed: 09/20/2012 [employer] Patient Information 2014 [employer].  

## 2013-12-06 ENCOUNTER — Telehealth: Payer: Self-pay

## 2013-12-06 ENCOUNTER — Other Ambulatory Visit: Payer: Self-pay

## 2013-12-06 MED ORDER — MONTELUKAST SODIUM 10 MG PO TABS: 10.0000 mg | ORAL_TABLET | Freq: Every day | ORAL | Status: DC

## 2013-12-06 MED ORDER — FLUTICASONE-SALMETEROL 250-50 MCG/DOSE IN AEPB: 1.0000 | INHALATION_SPRAY | Freq: Two times a day (BID) | RESPIRATORY_TRACT | Status: DC

## 2013-12-06 NOTE — Telephone Encounter (Signed)
When the patient was here in 06/2013, she was advised to RTC for re-evaluation of her BP in 1 month.  I am not comfortable sending in a 90-day supply of Ventolin HFA until we see her back.

## 2013-12-06 NOTE — Telephone Encounter (Signed)
Patient requesting a 90 day supply "Ventolin HFA" inhaler to [facility] [address] Patients call back number is [phone]

## 2013-12-06 NOTE — Telephone Encounter (Signed)
Pt is going to come in to be evaluated.

## 2013-12-12 ENCOUNTER — Other Ambulatory Visit: Payer: Self-pay | Admitting: *Deleted

## 2013-12-12 MED ORDER — ALBUTEROL SULFATE HFA 108 (90 BASE) MCG/ACT IN AERS: 2.0000 | INHALATION_SPRAY | RESPIRATORY_TRACT | Status: DC | PRN

## 2013-12-12 MED ORDER — MONTELUKAST SODIUM 10 MG PO TABS: 10.0000 mg | ORAL_TABLET | Freq: Every day | ORAL | Status: DC

## 2013-12-12 NOTE — Telephone Encounter (Signed)
Received fax request to refill loratadine and Ventolin inhaler for #90 supply for insurance purposes.

## 2014-02-14 ENCOUNTER — Ambulatory Visit (INDEPENDENT_AMBULATORY_CARE_PROVIDER_SITE_OTHER): Payer: Medicare Other | Admitting: Internal Medicine

## 2014-02-14 VITALS — BP 140/80 | HR 99 | Temp 98.6°F | Resp 16 | Ht 61.5 in | Wt 228.4 lb

## 2014-02-14 DIAGNOSIS — R350 Frequency of micturition: Secondary | ICD-10-CM

## 2014-02-14 DIAGNOSIS — J4521 Mild intermittent asthma with (acute) exacerbation: Secondary | ICD-10-CM | POA: Diagnosis not present

## 2014-02-14 DIAGNOSIS — N3090 Cystitis, unspecified without hematuria: Secondary | ICD-10-CM | POA: Diagnosis not present

## 2014-02-14 LAB — POCT UA - MICROSCOPIC ONLY
Bacteria, U Microscopic: NEGATIVE
Casts, Ur, LPF, POC: NEGATIVE
Crystals, Ur, HPF, POC: NEGATIVE
Mucus, UA: NEGATIVE
RBC, urine, microscopic: NEGATIVE
Yeast, UA: NEGATIVE

## 2014-02-14 LAB — POCT URINALYSIS DIPSTICK
Bilirubin, UA: NEGATIVE
Blood, UA: NEGATIVE
Glucose, UA: 500
Ketones, UA: NEGATIVE
Nitrite, UA: NEGATIVE
Protein, UA: NEGATIVE
Spec Grav, UA: 1.01
Urobilinogen, UA: 0.2
pH, UA: 6.5

## 2014-02-14 MED ORDER — ALBUTEROL SULFATE HFA 108 (90 BASE) MCG/ACT IN AERS: 2.0000 | INHALATION_SPRAY | RESPIRATORY_TRACT | Status: DC | PRN

## 2014-02-14 MED ORDER — NITROFURANTOIN MACROCRYSTAL 100 MG PO CAPS: ORAL_CAPSULE | ORAL | Status: DC

## 2014-02-14 NOTE — Progress Notes (Signed)
   Subjective:    Patient ID: Melissa AlphaSherri L Chavez, female    DOB: 06/17/1960, 53 y.o.   MRN: [id number]  HPI Has no primary care and needs one. Has frequency and urgency that feels like bladder infection to her. Usually uses macrodantin with success. No fever, vomiting or abdominal pain.   Review of Systems     Objective:   Physical Exam  Constitutional: She is oriented to person, place, and time. She appears well-developed and well-nourished. No distress.  HENT:  Head: Normocephalic.  Eyes: EOM are normal. No scleral icterus.  Neck: Normal range of motion. Neck supple.  Cardiovascular: Normal rate.   Pulmonary/Chest: Effort normal.  Abdominal: Soft. Bowel sounds are normal. There is tenderness. There is no rebound and no guarding.  Neurological: She is alert and oriented to person, place, and time. She exhibits normal muscle tone. Coordination normal.  Psychiatric: She has a normal mood and affect.   Results for orders placed or performed in visit on 02/14/14  POCT UA - Microscopic Only  Result Value Ref Range   WBC, Ur, HPF, POC 0-2    RBC, urine, microscopic neg    Bacteria, U Microscopic neg    Mucus, UA neg    Epithelial cells, urine per micros 3-5    Crystals, Ur, HPF, POC neg    Casts, Ur, LPF, POC neg    Yeast, UA neg   POCT urinalysis dipstick  Result Value Ref Range   Color, UA yellow    Clarity, UA slighty cloudy    Glucose, UA 500    Bilirubin, UA neg    Ketones, UA neg    Spec Grav, UA 1.010    Blood, UA neg    pH, UA 6.5    Protein, UA neg    Urobilinogen, UA 0.2    Nitrite, UA neg    Leukocytes, UA small (1+)    Cs urine        Assessment & Plan:  Cystitis Macrodantin 100mg  tid RF albuterol hfa Refer 104 for primary care

## 2014-02-14 NOTE — Patient Instructions (Signed)
Interstitial Cystitis Interstitial cystitis (IC) is a condition that results in discomfort or pain in the bladder and the surrounding pelvic region. The symptoms can be different from case to case and even in the same individual. People may experience:  Mild discomfort.  Pressure.  Tenderness.  Intense pain in the bladder and pelvic area. CAUSES  Because IC varies so much in symptoms and severity, people studying this disease believe it is not one but several diseases. Some caregivers use the term painful bladder syndrome (PBS) to describe cases with painful urinary symptoms. This may not meet the strictest definition of IC. The term IC / PBS includes all cases of urinary pain that cannot be connected to other causes, such as infection or urinary stones.  SYMPTOMS  Symptoms may include:  An urgent need to urinate.  A frequent need to urinate.  A combination of these symptoms. Pain may change in intensity as the bladder fills with urine or as it empties. Women's symptoms often get worse during menstruation. They may sometimes experience pain with vaginal intercourse. Some of the symptoms of IC / PBS seem like those of bacterial infection. Tests do not show infection. IC / PBS is far more common in women than in men.  DIAGNOSIS  The diagnosis of IC / PBS is based on:  Presence of pain related to the bladder, usually along with problems of frequency and urgency.  Not finding other diseases that could cause the symptoms.  Diagnostic tests that help rule out other diseases include:  Urinalysis.  Urine culture.  Cystoscopy.  Biopsy of the bladder wall.  Distension of the bladder under anesthesia.  Urine cytology.  Laboratory examination of prostate secretions. A biopsy is a tissue sample that can be looked at under a microscope. Samples of the bladder and urethra may be removed during a cystoscopy. A biopsy helps rule out bladder cancer. TREATMENT  Scientists have not yet found  a cure for IC / PBS. Patients with IC / PBS do not get better with antibiotic therapy. Caregivers cannot predict who will respond best to which treatment. Symptoms may disappear without explanation. Disappearing symptoms may coincide with an event such as a change in diet or treatment. Even when symptoms disappear, they may return after days, weeks, months, or years.  Because the causes of IC / PBS are unknown, current treatments are aimed at relieving symptoms. Many people are helped by one or a combination of the treatments. As researchers learn more about IC / PBS, the list of potential treatments will change. Patients should discuss their options with a caregiver. SURGERY  Surgery should be considered only if all available treatments have failed and the pain is disabling. Many approaches and techniques are used. Each approach has its own advantages and complications. Advantages and complications should be discussed with a urologist. Your caregiver may recommend consulting another urologist for a second opinion. Most caregivers are reluctant to operate because the outcome is unpredictable. Some people still have symptoms after surgery.  People considering surgery should discuss the potential risks and benefits, side effects, and long- and short-term complications with their family, as well as with people who have already had the procedure. Surgery requires anesthesia, hospitalization, and in some cases weeks or months of recovery. As the complexity of the procedure increases, so do the chances for complications and for failure. HOME CARE INSTRUCTIONS   All drugs, even those sold over the counter, have side effects. Patients should always consult a caregiver before using any   drug for an extended amount of time. Only take over-the-counter or prescription medicines for pain, discomfort, or fever as directed by your caregiver.  Many patients feel that smoking makes their symptoms worse. How the by-products  of tobacco that are excreted in the urine affect IC / PBS is unknown. Smoking is the major known cause of bladder cancer. One of the best things smokers can do for their bladder and their overall health is to quit.  Many patients feel that gentle stretching exercises help relieve IC / PBS symptoms.  Methods vary, but basically patients decide to empty their bladder at designated times and use relaxation techniques and distractions to keep to the schedule. Gradually, patients try to lengthen the time between scheduled voids. A diary in which to record voiding times is usually helpful in keeping track of progress. MAKE SURE YOU:   Understand these instructions.  Will watch your condition.  Will get help right away if you are not doing well or get worse. Document Released: 10/23/2003 Document Revised: 05/16/2011 Document Reviewed: 01/07/2008 [facility] LLCExitCare Patient Information 2015 [place], [place]. This information is not intended to replace advice given to you by your health care provider. Make sure you discuss any questions you have with your health care provider. Urinary Tract Infection Urinary tract infections (UTIs) can develop anywhere along your urinary tract. Your urinary tract is your body's drainage system for removing wastes and extra water. Your urinary tract includes two kidneys, two ureters, a bladder, and a urethra. Your kidneys are a pair of bean-shaped organs. Each kidney is about the size of your fist. They are located below your ribs, one on each side of your spine. CAUSES Infections are caused by microbes, which are microscopic organisms, including fungi, viruses, and bacteria. These organisms are so small that they can only be seen through a microscope. Bacteria are the microbes that most commonly cause UTIs. SYMPTOMS  Symptoms of UTIs may vary by age and gender of the patient and by the location of the infection. Symptoms in young women typically include a frequent and intense urge to  urinate and a painful, burning feeling in the bladder or urethra during urination. Older women and men are more likely to be tired, shaky, and weak and have muscle aches and abdominal pain. A fever may mean the infection is in your kidneys. Other symptoms of a kidney infection include pain in your back or sides below the ribs, nausea, and vomiting. DIAGNOSIS To diagnose a UTI, your caregiver will ask you about your symptoms. Your caregiver also will ask to provide a urine sample. The urine sample will be tested for bacteria and white blood cells. White blood cells are made by your body to help fight infection. TREATMENT  Typically, UTIs can be treated with medication. Because most UTIs are caused by a bacterial infection, they usually can be treated with the use of antibiotics. The choice of antibiotic and length of treatment depend on your symptoms and the type of bacteria causing your infection. HOME CARE INSTRUCTIONS  If you were prescribed antibiotics, take them exactly as your caregiver instructs you. Finish the medication even if you feel better after you have only taken some of the medication.  Drink enough water and fluids to keep your urine clear or pale yellow.  Avoid caffeine, tea, and carbonated beverages. They tend to irritate your bladder.  Empty your bladder often. Avoid holding urine for long periods of time.  Empty your bladder before and after sexual intercourse.  After  a bowel movement, women should cleanse from front to back. Use each tissue only once. SEEK MEDICAL CARE IF:   You have back pain.  You develop a fever.  Your symptoms do not begin to resolve within 3 days. SEEK IMMEDIATE MEDICAL CARE IF:   You have severe back pain or lower abdominal pain.  You develop chills.  You have nausea or vomiting.  You have continued burning or discomfort with urination. MAKE SURE YOU:   Understand these instructions.  Will watch your condition.  Will get help right  away if you are not doing well or get worse. Document Released: 12/01/2004 Document Revised: 08/23/2011 Document Reviewed: 04/01/2011 [facility] CenterExitCare Patient Information 2015 [place]Care, [place]. This information is not intended to replace advice given to you by your health care provider. Make sure you discuss any questions you have with your health care provider.

## 2014-02-15 LAB — URINE CULTURE: Colony Count: 45000

## 2014-04-25 ENCOUNTER — Encounter: Payer: Medicare Other | Admitting: Family Medicine

## 2014-06-03 ENCOUNTER — Other Ambulatory Visit: Payer: Self-pay | Admitting: Emergency Medicine

## 2014-06-08 ENCOUNTER — Other Ambulatory Visit: Payer: Self-pay | Admitting: Emergency Medicine

## 2014-06-13 ENCOUNTER — Other Ambulatory Visit: Payer: Self-pay | Admitting: *Deleted

## 2014-06-13 ENCOUNTER — Encounter: Payer: Self-pay | Admitting: Family Medicine

## 2014-06-13 ENCOUNTER — Ambulatory Visit (INDEPENDENT_AMBULATORY_CARE_PROVIDER_SITE_OTHER): Payer: Medicare Other

## 2014-06-13 ENCOUNTER — Ambulatory Visit (INDEPENDENT_AMBULATORY_CARE_PROVIDER_SITE_OTHER): Payer: Medicare Other | Admitting: Family Medicine

## 2014-06-13 VITALS — BP 148/92 | HR 93 | Temp 98.3°F | Resp 16 | Ht 61.75 in | Wt 224.2 lb

## 2014-06-13 DIAGNOSIS — J4540 Moderate persistent asthma, uncomplicated: Secondary | ICD-10-CM | POA: Diagnosis not present

## 2014-06-13 DIAGNOSIS — N3090 Cystitis, unspecified without hematuria: Secondary | ICD-10-CM | POA: Diagnosis not present

## 2014-06-13 DIAGNOSIS — M899 Disorder of bone, unspecified: Secondary | ICD-10-CM | POA: Diagnosis not present

## 2014-06-13 DIAGNOSIS — R7309 Other abnormal glucose: Secondary | ICD-10-CM | POA: Diagnosis not present

## 2014-06-13 DIAGNOSIS — I10 Essential (primary) hypertension: Secondary | ICD-10-CM

## 2014-06-13 DIAGNOSIS — M79645 Pain in left finger(s): Secondary | ICD-10-CM

## 2014-06-13 DIAGNOSIS — Z23 Encounter for immunization: Secondary | ICD-10-CM

## 2014-06-13 DIAGNOSIS — R600 Localized edema: Secondary | ICD-10-CM | POA: Diagnosis not present

## 2014-06-13 DIAGNOSIS — R5383 Other fatigue: Secondary | ICD-10-CM | POA: Diagnosis not present

## 2014-06-13 DIAGNOSIS — R1310 Dysphagia, unspecified: Secondary | ICD-10-CM | POA: Diagnosis not present

## 2014-06-13 DIAGNOSIS — E1169 Type 2 diabetes mellitus with other specified complication: Secondary | ICD-10-CM | POA: Diagnosis not present

## 2014-06-13 DIAGNOSIS — Z1239 Encounter for other screening for malignant neoplasm of breast: Secondary | ICD-10-CM

## 2014-06-13 DIAGNOSIS — E559 Vitamin D deficiency, unspecified: Secondary | ICD-10-CM

## 2014-06-13 DIAGNOSIS — R350 Frequency of micturition: Secondary | ICD-10-CM

## 2014-06-13 DIAGNOSIS — K219 Gastro-esophageal reflux disease without esophagitis: Secondary | ICD-10-CM

## 2014-06-13 DIAGNOSIS — E785 Hyperlipidemia, unspecified: Secondary | ICD-10-CM | POA: Diagnosis not present

## 2014-06-13 DIAGNOSIS — R351 Nocturia: Secondary | ICD-10-CM

## 2014-06-13 DIAGNOSIS — J4521 Mild intermittent asthma with (acute) exacerbation: Secondary | ICD-10-CM

## 2014-06-13 DIAGNOSIS — M79609 Pain in unspecified limb: Secondary | ICD-10-CM | POA: Diagnosis not present

## 2014-06-13 DIAGNOSIS — N3941 Urge incontinence: Secondary | ICD-10-CM

## 2014-06-13 DIAGNOSIS — Z780 Asymptomatic menopausal state: Secondary | ICD-10-CM

## 2014-06-13 LAB — LIPID PANEL
Cholesterol: 237 mg/dL — ABNORMAL HIGH (ref 0–200)
HDL: 28 mg/dL — ABNORMAL LOW (ref >=46)
LDL Cholesterol: 152 mg/dL — ABNORMAL HIGH (ref 0–99)
Total CHOL/HDL Ratio: 8.5 Ratio
Triglycerides: 285 mg/dL — ABNORMAL HIGH (ref <150)
VLDL: 57 mg/dL — ABNORMAL HIGH (ref 0–40)

## 2014-06-13 LAB — POCT CBC
Granulocyte percent: 68 %G (ref 37–80)
HCT, POC: 40.4 % (ref 37.7–47.9)
Hemoglobin: 13.3 g/dL (ref 12.2–16.2)
Lymph, poc: 3.2 (ref 0.6–3.4)
MCH, POC: 27.56 pg (ref 27–31.2)
MCHC: 33 g/dL (ref 31.8–35.4)
MCV: 83.7 fL (ref 80–97)
MID (cbc): 0.4 (ref 0–0.9)
MPV: 8.7 fL (ref 0–99.8)
POC Granulocyte: 7.6 — AB (ref 2–6.9)
POC LYMPH PERCENT: 28.8 %L (ref 10–50)
POC MID %: 3.2 %M (ref 0–12)
Platelet Count, POC: 292 10*3/uL (ref 142–424)
RBC: 4.82 M/uL (ref 4.04–5.48)
RDW, POC: 14.8 %
WBC: 11.2 10*3/uL — AB (ref 4.6–10.2)

## 2014-06-13 LAB — HIV ANTIBODY (ROUTINE TESTING W REFLEX): HIV 1&2 Ab, 4th Generation: NONREACTIVE

## 2014-06-13 LAB — POCT UA - MICROSCOPIC ONLY
Casts, Ur, LPF, POC: NEGATIVE
Crystals, Ur, HPF, POC: NEGATIVE
Mucus, UA: POSITIVE
Yeast, UA: NEGATIVE

## 2014-06-13 LAB — COMPREHENSIVE METABOLIC PANEL
ALT: 11 U/L (ref 0–35)
AST: 10 U/L (ref 0–37)
Albumin: 3.7 g/dL (ref 3.5–5.2)
Alkaline Phosphatase: 122 U/L — ABNORMAL HIGH (ref 39–117)
BUN: 9 mg/dL (ref 6–23)
CO2: 29 mEq/L (ref 19–32)
Calcium: 9.3 mg/dL (ref 8.4–10.5)
Chloride: 96 mEq/L (ref 96–112)
Creat: 1.04 mg/dL (ref 0.50–1.10)
Glucose, Bld: 343 mg/dL — ABNORMAL HIGH (ref 70–99)
Potassium: 4.4 mEq/L (ref 3.5–5.3)
Sodium: 134 mEq/L — ABNORMAL LOW (ref 135–145)
Total Bilirubin: 0.4 mg/dL (ref 0.2–1.2)
Total Protein: 7.6 g/dL (ref 6.0–8.3)

## 2014-06-13 LAB — POCT URINALYSIS DIPSTICK
Bilirubin, UA: NEGATIVE
Blood, UA: NEGATIVE
Glucose, UA: 1000
Ketones, UA: NEGATIVE
Leukocytes, UA: NEGATIVE
Nitrite, UA: NEGATIVE
Protein, UA: NEGATIVE
Spec Grav, UA: 1.01
Urobilinogen, UA: 0.2
pH, UA: 5.5

## 2014-06-13 LAB — TSH: TSH: 0.837 u[IU]/mL (ref 0.350–4.500)

## 2014-06-13 LAB — POCT GLYCOSYLATED HEMOGLOBIN (HGB A1C): Hemoglobin A1C: 14

## 2014-06-13 LAB — HEPATITIS C ANTIBODY: HCV Ab: NEGATIVE

## 2014-06-13 LAB — VITAMIN D 25 HYDROXY (VIT D DEFICIENCY, FRACTURES): Vit D, 25-Hydroxy: 10 ng/mL — ABNORMAL LOW (ref 30–100)

## 2014-06-13 LAB — GLUCOSE, POCT (MANUAL RESULT ENTRY): POC Glucose: 358 mg/dl — AB (ref 70–99)

## 2014-06-13 MED ORDER — ESOMEPRAZOLE MAGNESIUM 40 MG PO CPDR: 40.0000 mg | DELAYED_RELEASE_CAPSULE | Freq: Every day | ORAL | Status: DC

## 2014-06-13 MED ORDER — HYDROCHLOROTHIAZIDE 25 MG PO TABS: 25.0000 mg | ORAL_TABLET | Freq: Every day | ORAL | Status: DC

## 2014-06-13 MED ORDER — BLOOD GLUCOSE MONITOR KIT: PACK | Status: AC

## 2014-06-13 MED ORDER — CETIRIZINE HCL 10 MG PO TABS: 10.0000 mg | ORAL_TABLET | Freq: Every day | ORAL | Status: DC

## 2014-06-13 MED ORDER — FLUTICASONE PROPIONATE 50 MCG/ACT NA SUSP: 2.0000 | Freq: Every day | NASAL | Status: DC

## 2014-06-13 MED ORDER — LANCETS MISC: Status: DC

## 2014-06-13 MED ORDER — METFORMIN HCL 500 MG PO TABS: 500.0000 mg | ORAL_TABLET | Freq: Two times a day (BID) | ORAL | Status: DC

## 2014-06-13 MED ORDER — LISINOPRIL 40 MG PO TABS: 40.0000 mg | ORAL_TABLET | Freq: Every day | ORAL | Status: DC

## 2014-06-13 MED ORDER — MONTELUKAST SODIUM 10 MG PO TABS: 10.0000 mg | ORAL_TABLET | Freq: Every day | ORAL | Status: DC

## 2014-06-13 MED ORDER — ALBUTEROL SULFATE HFA 108 (90 BASE) MCG/ACT IN AERS: 2.0000 | INHALATION_SPRAY | RESPIRATORY_TRACT | Status: DC | PRN

## 2014-06-13 MED ORDER — GLUCOSE BLOOD VI STRP: ORAL_STRIP | Status: DC

## 2014-06-13 MED ORDER — OXYBUTYNIN CHLORIDE ER 10 MG PO TB24: 10.0000 mg | ORAL_TABLET | Freq: Every day | ORAL | Status: DC

## 2014-06-13 NOTE — Patient Instructions (Addendum)
Start aspirin 81 mg enteric coated daily. Reschedule your annual wellness visit. Start cetirizine rather than loratadine.  Start flonase at night.  Continue singulair at night. Double up on your blood pressure pill and your oxybutynin until gone and then get the new ones. Start any type of proton pump inhibitor (nexium, prilosec, protonix, prevacid) that is preferred by your insurance. If you use over-the-counter of these, make sure you double the dose and either take 2 tabs before breakfast every day or one tab 30 mintues before dinner twice a day. Make an appointment with your eye doctor - let us know if you need a referral for your insurance. The next time you have a cut, make sure you come by the office for your tetanus shot. Increase your advair to twice a day. Call to schedule your mammogram. Take 1239m of calcium CITRATE (citracal, caltrate) divided into two daily doses along with vitamin D.  Start metformin with breakfast and dinner. If you are doing okay after a week increase to 2 tabs twice a day.  Diabetes Mellitus and Food It is important for you to manage your blood sugar (glucose) level. Your blood glucose level can be greatly affected by what you eat. Eating healthier foods in the appropriate amounts throughout the day at about the same time each day will help you control your blood glucose level. It can also help slow or prevent worsening of your diabetes mellitus. Healthy eating may even help you improve the level of your blood pressure and reach or maintain a healthy weight.  HOW CAN FOOD AFFECT ME? Carbohydrates Carbohydrates affect your blood glucose level more than any other type of food. Your dietitian will help you determine how many carbohydrates to eat at each meal and teach you how to count carbohydrates. Counting carbohydrates is important to keep your blood glucose at a healthy level, especially if you are using insulin or taking certain medicines for diabetes  mellitus. Alcohol Alcohol can cause sudden decreases in blood glucose (hypoglycemia), especially if you use insulin or take certain medicines for diabetes mellitus. Hypoglycemia can be a life-threatening condition. Symptoms of hypoglycemia (sleepiness, dizziness, and disorientation) are similar to symptoms of having too much alcohol.  If your health care provider has given you approval to drink alcohol, do so in moderation and use the following guidelines:  Women should not have more than one drink per day, and men should not have more than two drinks per day. One drink is equal to:  12 oz of beer.  5 oz of wine.  1 oz of hard liquor.  Do not drink on an empty stomach.  Keep yourself hydrated. Have water, diet soda, or unsweetened iced tea.  Regular soda, juice, and other mixers might contain a lot of carbohydrates and should be counted. WHAT FOODS ARE NOT RECOMMENDED? As you make food choices, it is important to remember that all foods are not the same. Some foods have fewer nutrients per serving than other foods, even though they might have the same number of calories or carbohydrates. It is difficult to get your body what it needs when you eat foods with fewer nutrients. Examples of foods that you should avoid that are high in calories and carbohydrates but low in nutrients include:  Trans fats (most processed foods list trans fats on the Nutrition Facts label).  Regular soda.  Juice.  Candy.  Sweets, such as cake, pie, doughnuts, and cookies.  Fried foods. WHAT FOODS CAN I EAT? Have nutrient-rich  foods, which will nourish your body and keep you healthy. The food you should eat also will depend on several factors, including:  The calories you need.  The medicines you take.  Your weight.  Your blood glucose level.  Your blood pressure level.  Your cholesterol level. You also should eat a variety of foods, including:  Protein, such as meat, poultry, fish, tofu, nuts,  and seeds (lean animal proteins are best).  Fruits.  Vegetables.  Dairy products, such as milk, cheese, and yogurt (low fat is best).  Breads, grains, pasta, cereal, rice, and beans.  Fats such as olive oil, trans fat-free margarine, canola oil, avocado, and olives. DOES EVERYONE WITH DIABETES MELLITUS HAVE THE SAME MEAL PLAN? Because every person with diabetes mellitus is different, there is not one meal plan that works for everyone. It is very important that you meet with a dietitian who will help you create a meal plan that is just right for you. Document Released: 11/18/2004 Document Revised: 02/26/2013 Document Reviewed: 01/18/2013 [facility] Patient Information 2015 [place], [place]. This information is not intended to replace advice given to you by your health care provider. Make sure you discuss any questions you have with your health care provider. Diabetes, Eating Away From Home Sometimes, you might eat in a restaurant or have meals that are prepared by someone else. You can enjoy eating out. However, the portions in restaurants may be much larger than needed. Listed below are some ideas to help you choose foods that will keep your blood glucose (sugar) in better control.  TIPS FOR EATING OUT  Know your meal plan and how many carbohydrate servings you should have at each meal. You may wish to carry a copy of your meal plan in your purse or wallet. Learn the foods included in each food group.  Make a list of restaurants near you that offer healthy choices. Take a copy of the carry-out menus to see what they offer. Then, you can plan what you will order ahead of time.  Become familiar with serving sizes by practicing them at home using measuring cups and spoons. Once you learn to recognize portion sizes, you will be able to correctly estimate the amount of total carbohydrate you are allowed to eat at the restaurant. Ask for a takeout box if the portion is more than you should have. When  your food comes, leave the amount you should have on the plate, and put the rest in the takeout box before you start eating.  Plan ahead if your mealtime will be different from usual. Check with your caregiver to find out how to time meals and medicine if you are taking insulin.  Avoid high-fat foods, such as fried foods, cream sauces, high-fat salad dressings, or any added butter or margarine.  Do not be afraid to ask questions. Ask your server about the portion size, cooking methods, ingredients and if items can be substituted. Restaurants do not list all available items on the menu. You can ask for your main entree to be prepared using skim milk, oil instead of butter or margarine, and without gravy or sauces. Ask your waiter or waitress to serve salad dressings, gravy, sauces, margarine, and sour cream on the side. You can then add the amount your meal plan suggests.  Add more vegetables whenever possible.  Avoid items that are labeled "jumbo," "giant," "deluxe," or "supersized."  You may want to split an entre with someone and order an extra side salad.  Watch for  hidden calories in foods like croutons, bacon, or cheese.  Ask your server to take away the bread basket or chips from your table.  Order a dinner salad as an appetizer. You can eat most foods served in a restaurant. Some foods are better choices than others. Breads and Starches  Recommended: All kinds of bread (wheat, rye, white, oatmeal, [place], [place], raisin), hard or soft dinner rolls, frankfurter or hamburger buns, small bagels, small corn or whole-wheat flour tortillas.  Avoid: Frosted or glazed breads, butter rolls, egg or cheese breads, croissants, sweet rolls, pastries, coffee cake, glazed or frosted doughnuts, muffins. Crackers  Recommended: Animal crackers, graham, rye, saltine, oyster, and matzoth crackers. Bread sticks, melba toast, rusks, pretzels, popcorn (without fat), zwieback toast.  Avoid: High-fat  snack crackers or chips. Buttered popcorn. Cereals  Recommended: Hot and cold cereals. Whole grains such as oatmeal or shredded wheat are good choices.  Avoid: Sugar-coated or granola type cereals. Potatoes/Pasta/Rice/Beans  Recommended: Order baked, boiled, or mashed potatoes, rice or noodles without added fat, whole beans. Order gravies, butter, margarine, or sauces on the side so you can control the amount you add.  Avoid: Hash browns or fried potatoes. Potatoes, pasta, or rice prepared with cream or cheese sauce. Potato or pasta salads prepared with large amounts of dressing. Fried beans or fried rice. Vegetables  Recommended: Order steamed, baked, boiled, or stewed vegetables without sauces or extra fat. Ask that sauce be served on the side. If vegetables are not listed on the menu, ask what is available.  Avoid: Vegetables prepared with cream, butter, or cheese sauce. Fried vegetables. Salad Bars  Recommended: Many of the vegetables at a salad bar are considered "free." Use lemon juice, vinegar, or low-calorie salad dressing (fewer than 20 calories per serving) as "free" dressings for your salad. Look for salad bar ingredients that have no added fat or sugar such as tomatoes, lettuce, cucumbers, broccoli, carrots, onions, and mushrooms.  Avoid: Prepared salads with large amounts of dressing, such as coleslaw, caesar salad, macaroni salad, bean salad, or carrot salad. Fruit  Recommended: Eat fresh fruit or fresh fruit salad without added dressing. A salad bar often offers fresh fruit choices, but canned fruit at a restaurant is usually packed in sugar or syrup.  Avoid: Sweetened canned or frozen fruits, plain or sweetened fruit juice. Fruit salads with dressing, sour cream, or sugar added to them. Meat and Meat Substitutes  Recommended: Order broiled, baked, roasted, or grilled meat, poultry, or fish. Trim off all visible fat. Do not eat the skin of poultry. The size stated on the  menu is the raw weight. Meat shrinks by  in cooking (for example, 4 oz raw equals 3 oz cooked meat).  Avoid: Deep-fat fried meat, poultry, or fish. Breaded meats. Eggs  Recommended: Order soft, hard-cooked, poached, or scrambled eggs. Omelets may be okay, depending on what ingredients are added. Egg substitutes are also a good choice.  Avoid: Fried eggs, eggs prepared with cream or cheese sauce. Milk  Recommended: Order low-fat or fat-free milk according to your meal plan. Plain, nonfat yogurt or flavored yogurt with no sugar added may be used as a substitute for milk. Soy milk may also be used.  Avoid: Milk shakes or sweetened milk beverages. Soups and Combination Foods  Recommended: Clear broth or consomm are "free" foods and may be used as an appetizer. Broth-based soups with fat removed count as a starch serving and are preferred over cream soups. Soups made with beans or split  peas may be eaten but count as a starch.  Avoid: Fatty soups, soup made with cream, cheese soup. Combination foods prepared with excessive amounts of fat or with cream or cheese sauces. Desserts and Sweets  Recommended: Ask for fresh fruit. Sponge or angel food cake without icing, ice milk, no sugar added ice cream, sherbet, or frozen yogurt may fit into your meal plan occasionally.  Avoid: Pastries, puddings, pies, cakes with icing, custard, gelatin desserts. Fats and Oils  Recommended: Choose healthy fats such as olive oil, canola oil, or tub margarine, reduced fat or fat-free sour cream, cream cheese, avocado, or nuts.  Avoid: Any fats in excess of your allowed portion. Deep-fried foods or any food with a large amount of fat. Note: Ask for all fats to be served on the side, and limit your portion sizes according to your meal plan. Document Released: 02/21/2005 Document Revised: 05/16/2011 Document Reviewed: 05/21/2013 [facility] Patient Information 2015 [place], [place]. This information is not intended to  replace advice given to you by your health care provider. Make sure you discuss any questions you have with your health care provider. Complementary and Alternative Medical Therapies for Diabetes Complementary and alternative medicines are health care practices or products that are not always accepted as part of routine medicine. Complementary medicine is used along with routine medicine (medical therapy). Alternative medicine can sometimes be used instead of routine medicine. Some people use these methods to treat diabetes. While some of these therapies may be effective, others may not be. Some may even be harmful. Patients using these methods need to tell their caregiver. It is important to let your caregivers know what you are doing. Some of these therapies are discussed below. For more information, talk with your caregiver. THERAPIES Acupuncture Acupuncture is done by a professional who inserts needles into certain points on the skin. Some scientists believe that this triggers the release of the body's natural painkillers. It has been shown to relieve long-term (chronic) pain. This may help patients with painful nerve damage caused by diabetes. Biofeedback Biofeedback helps a person become more aware of the body's response to pain. It also helps you learn to deal with the pain. This alternative therapy focuses on relaxation and stress-reduction techniques. Thinking of peaceful mental images (guided imagery) is one technique. Some people believe these images can ease their condition. MEDICATIONS Chromium Several studies report that chromium supplements may improve diabetes control. Chromium helps insulin improve its action. Research is not yet certain. Supplements have not been recommended or approved. Caution is needed if you have kidney (renal) problems. Ginseng There are several types of ginseng plants. American ginseng is used for diabetes studies. Those studies have shown some glucose-lowering  effects. Those effects have been seen with fasting and after-meal blood glucose levels. They have also been seen in A1c levels (average blood glucose levels over a 71-monthperiod). More long-term studies are needed before recommendations for use of ginseng can be made. Magnesium Experts have studied the relationship between magnesium and diabetes for many years. But it is not yet fully understood. Studies suggest that a low amount of magnesium may make blood glucose control worse in type 2 diabetes. Research also shows that a low amount may contribute to certain diabetes complications. One study showed that people who consume more magnesium had less risk of type 2 diabetes. Eating whole grains, nuts, and green leafy vegetables raises the magnesium level. Vanadium Vanadium is a compound found in tiny amounts in plants and animals. Early studies  showed that vanadium improved blood glucose levels in animals with type 1 and type 2 diabetes. One study found that when given vanadium, those with diabetes were able to decrease their insulin dosage. Researchers still need to learn how it works in the body to discover any side effects, and to find safe dosages. Cinnamon There have been a couple of studies that seem to indicate cinnamon decreases insulin resistance and increases insulin production. By doing so, it may lower blood glucose. Exact doses are unknown, but it may work best when used in combination with other diabetes medicines. Document Released: 12/19/2006 Document Revised: 05/16/2011 Document Reviewed: 01/01/2009 [facility] Patient Information 2015 [place], [place]. This information is not intended to replace advice given to you by your health care provider. Make sure you discuss any questions you have with your health care provider. Diabetes and Exercise Exercising regularly is important. It is not just about losing weight. It has many health benefits, such as: Improving your overall fitness, flexibility,  and endurance. Increasing your bone density. Helping with weight control. Decreasing your body fat. Increasing your muscle strength. Reducing stress and tension. Improving your overall health. People with diabetes who exercise gain additional benefits because exercise: Reduces appetite. Improves the body's use of blood sugar (glucose). Helps lower or control blood glucose. Decreases blood pressure. Helps control blood lipids (such as cholesterol and triglycerides). Improves the body's use of the hormone insulin by: Increasing the body's insulin sensitivity. Reducing the body's insulin needs. Decreases the risk for heart disease because exercising: Lowers cholesterol and triglycerides levels. Increases the levels of good cholesterol (such as high-density lipoproteins [HDL]) in the body. Lowers blood glucose levels. YOUR ACTIVITY PLAN  Choose an activity that you enjoy and set realistic goals. Your health care provider or diabetes educator can help you make an activity plan that works for you. Exercise regularly as directed by your health care provider. This includes: Performing resistance training twice a week such as push-ups, sit-ups, lifting weights, or using resistance bands. Performing 150 minutes of cardio exercises each week such as walking, running, or playing sports. Staying active and spending no more than 90 minutes at one time being inactive. Even short bursts of exercise are good for you. Three 10-minute sessions spread throughout the day are just as beneficial as a single 30-minute session. Some exercise ideas include: Taking the dog for a walk. Taking the stairs instead of the elevator. Dancing to your favorite song. Doing an exercise video. Doing your favorite exercise with a friend. RECOMMENDATIONS FOR EXERCISING WITH TYPE 1 OR TYPE 2 DIABETES  Check your blood glucose before exercising. If blood glucose levels are greater than 240 mg/dL, check for urine ketones. Do  not exercise if ketones are present. Avoid injecting insulin into areas of the body that are going to be exercised. For example, avoid injecting insulin into: The arms when playing tennis. The legs when jogging. Keep a record of: Food intake before and after you exercise. Expected peak times of insulin action. Blood glucose levels before and after you exercise. The type and amount of exercise you have done. Review your records with your health care provider. Your health care provider will help you to develop guidelines for adjusting food intake and insulin amounts before and after exercising. If you take insulin or oral hypoglycemic agents, watch for signs and symptoms of hypoglycemia. They include: Dizziness. Shaking. Sweating. Chills. Confusion. Drink plenty of water while you exercise to prevent dehydration or heat stroke. Body water is lost during  exercise and must be replaced. Talk to your health care provider before starting an exercise program to make sure it is safe for you. Remember, almost any type of activity is better than none. Document Released: 05/14/2003 Document Revised: 07/08/2013 Document Reviewed: 07/31/2012 [facility] Patient Information 2015 [place], [place]. This information is not intended to replace advice given to you by your health care provider. Make sure you discuss any questions you have with your health care provider. Blood Glucose Monitoring Monitoring your blood glucose (also know as blood sugar) helps you to manage your diabetes. It also helps you and your health care provider monitor your diabetes and determine how well your treatment plan is working. WHY SHOULD YOU MONITOR YOUR BLOOD GLUCOSE? It can help you understand how food, exercise, and medicine affect your blood glucose. It allows you to know what your blood glucose is at any given moment. You can quickly tell if you are having low blood glucose (hypoglycemia) or high blood glucose (hyperglycemia). It  can help you and your health care provider know how to adjust your medicines. It can help you understand how to manage an illness or adjust medicine for exercise. WHEN SHOULD YOU TEST? Your health care provider will help you decide how often you should check your blood glucose. This may depend on the type of diabetes you have, your diabetes control, or the types of medicines you are taking. Be sure to write down all of your blood glucose readings so that this information can be reviewed with your health care provider. See below for examples of testing times that your health care provider may suggest. Type 1 Diabetes Test 4 times a day if you are in good control, using an insulin pump, or perform multiple daily injections. If your diabetes is not well controlled or if you are sick, you may need to monitor more often. It is a good idea to also monitor: Before and after exercise. Between meals and 2 hours after a meal. Occasionally between 2:00 a.m. and 3:00 a.m. Type 2 Diabetes It can vary with each person, but generally, if you are on insulin, test 4 times a day. If you take medicines by mouth (orally), test 2 times a day. If you are on a controlled diet, test once a day. If your diabetes is not well controlled or if you are sick, you may need to monitor more often. HOW TO MONITOR YOUR BLOOD GLUCOSE Supplies Needed Blood glucose meter. Test strips for your meter. Each meter has its own strips. You must use the strips that go with your own meter. A pricking needle (lancet). A device that holds the lancet (lancing device). A journal or log book to write down your results. Procedure Wash your hands with soap and water. Alcohol is not preferred. Prick the side of your finger (not the tip) with the lancet. Gently milk the finger until a small drop of blood appears. Follow the instructions that come with your meter for inserting the test strip, applying blood to the strip, and using your blood  glucose meter. Other Areas to Get Blood for Testing Some meters allow you to use other areas of your body (other than your finger) to test your blood. These areas are called alternative sites. The most common alternative sites are: The forearm. The thigh. The back area of the lower leg. The palm of the hand. The blood flow in these areas is slower. Therefore, the blood glucose values you get may be delayed, and the numbers  are different from what you would get from your fingers. Do not use alternative sites if you think you are having hypoglycemia. Your reading will not be accurate. Always use a finger if you are having hypoglycemia. Also, if you cannot feel your lows (hypoglycemia unawareness), always use your fingers for your blood glucose checks. ADDITIONAL TIPS FOR GLUCOSE MONITORING Do not reuse lancets. Always carry your supplies with you. All blood glucose meters have a 24-hour "hotline" number to call if you have questions or need help. Adjust (calibrate) your blood glucose meter with a control solution after finishing a few boxes of strips. BLOOD GLUCOSE RECORD KEEPING It is a good idea to keep a daily record or log of your blood glucose readings. Most glucose meters, if not all, keep your glucose records stored in the meter. Some meters come with the ability to download your records to your home computer. Keeping a record of your blood glucose readings is especially helpful if you are wanting to look for patterns. Make notes to go along with the blood glucose readings because you might forget what happened at that exact time. Keeping good records helps you and your health care provider to work together to achieve good diabetes management.  Document Released: 02/24/2003 Document Revised: 07/08/2013 Document Reviewed: 07/16/2012 [facility] Patient Information 2015 [place], [place]. This information is not intended to replace advice given to you by your health care provider. Make sure you discuss  any questions you have with your health care provider. How to Avoid Diabetes Problems You can do a lot to prevent or slow down diabetes problems. Following your diabetes plan and taking care of yourself can reduce your risk of serious or life-threatening complications. Below, you will find certain things you can do to prevent diabetes problems. MANAGE YOUR DIABETES Follow your health care provider's, nurse educator's, and dietitian's instructions for managing your diabetes. They will teach you the basics of diabetes care. They can help answer questions you may have. Learn about diabetes and make healthy choices regarding eating and physical activity. Monitor your blood glucose level regularly. Your health care provider will help you decide how often to check your blood glucose level depending on your treatment goals and how well you are meeting them.  DO NOT USE NICOTINE Nicotine and diabetes are a dangerous combination. Nicotine raises your risk for diabetes problems. If you quit using nicotine, you will lower your risk for heart attack, stroke, nerve disease, and kidney disease. Your cholesterol and your blood pressure levels may improve. Your blood circulation will also improve. Do not use any tobacco products, including cigarettes, chewing tobacco, or electronic cigarettes. If you need help quitting, ask your health care provider. KEEP YOUR BLOOD PRESSURE UNDER CONTROL Keeping your blood pressure under control will help prevent damage to your eyes, kidneys, heart, and blood vessels. Blood pressure consists of two numbers. The top number should be below 120, and the bottom number should be below 80 (120/80). Keep your blood pressure as close to these numbers as you can. If you already have kidney disease, you may want even lower blood pressure to protect your kidneys. Talk to your health care provider to make sure that your blood pressure goal is right for your needs. Meal planning, medicines, and  exercise can help you reach your blood pressure target. Have your blood pressure checked at every visit with your health care provider. KEEP YOUR CHOLESTEROL UNDER CONTROL Normal cholesterol levels will help prevent heart disease and stroke. These are the  biggest health problems for people with diabetes. Keeping cholesterol levels under control can also help with blood flow. Have your cholesterol level checked at least once a year. Your health care provider may prescribe a medicine known as a statin. Statins lower your cholesterol. If you are not taking a statin, ask your health care provider if you should be. Meal planning, exercise, and medicines can help you reach your cholesterol targets.  SCHEDULE AND KEEP YOUR ANNUAL PHYSICAL EXAMS AND EYE EXAMS Your health care provider will tell you how often he or she wants to see you depending on your plan of treatment. It is important that you keep these appointments so that possible problems can be identified early and complications can be avoided or treated. Every visit with your health care provider should include your weight, blood pressure, and an evaluation of your blood glucose control. Your hemoglobin A1c should be checked: At least twice a year if you are at your goal. Every 3 months if there are changes in treatment. If you are not meeting your goals. Your blood lipids should be checked yearly. You should also be checked yearly to see if you have protein in your urine (microalbumin). Schedule a dilated eye exam within 5 years of your diagnosis if you have type 1 diabetes, and then yearly. Schedule a dilated eye exam at diagnosis if you have type 2 diabetes, and then yearly. All exams thereafter can be extended to every 2 to 3 years if one or more exams have been normal. KEEP YOUR VACCINES CURRENT The flu vaccine is recommended yearly. The formula for the vaccine changes every year and needs to be updated for the best protection against current  viruses. It is recommended that people with diabetes who are over 38 years old get the pneumonia vaccine. In some cases, two separate shots may be given. Ask your health care provider if your pneumonia vaccination is up-to-date. However, there are some instances where another vaccine is recommended. Check with your health care provider. TAKE CARE OF YOUR FEET  Diabetes may cause you to have a poor blood supply (circulation) to your legs and feet. Because of this, the skin may be thinner, break easier, and heal more slowly. You also may have nerve damage in your legs and feet, causing decreased feeling. You may not notice minor injuries to your feet that could lead to serious problems or infections. Taking care of your feet is very important. Visual foot exams are performed at every routine medical visit. The exams check for cuts, injuries, or other problems with the feet. A comprehensive foot exam should be done yearly. This includes visual inspection as well as assessing foot pulses and testing for loss of sensation. You should also do the following: Inspect your feet daily for cuts, calluses, blisters, ingrown toenails, and signs of infection, such as redness, swelling, or pus. Wash and dry your feet thoroughly, especially between the toes. Avoid soaking your feet regularly in hot water baths. Moisturize dry skin with lotion, avoiding areas between your toes. Cut toenails straight across and file the edges. Avoid shoes that do not fit well or have areas that irritate your skin. Avoid going barefooted or wearing only socks. Your feet need protection. TAKE CARE OF YOUR TEETH People with poorly controlled diabetes are more likely to have gum (periodontal) disease. These infections make diabetes harder to control. Periodontal diseases, if left untreated, can lead to tooth loss. Brush your teeth twice a day, floss, and see your dentist  for checkups and cleaning every 6 months, or 2 times a year. ASK YOUR  HEALTH CARE PROVIDER ABOUT TAKING ASPIRIN Taking aspirin daily is recommended to help prevent cardiovascular disease in people with and without diabetes. Ask your health care provider if this would benefit you and what dose he or she would recommend. DRINK RESPONSIBLY Moderate amounts of alcohol (less than 1 drink per day for adult women and less than 2 drinks per day for adult men) have a minimal effect on blood glucose if ingested with food. It is important to eat food with alcohol to avoid hypoglycemia. People should avoid alcohol if they have a history of alcohol abuse or dependence, if they are pregnant, and if they have liver disease, pancreatitis, advanced neuropathy, or severe hypertriglyceridemia. LESSEN STRESS Living with diabetes can be stressful. When you are under stress, your blood glucose may be affected in two ways: Stress hormones may cause your blood glucose to rise. You may be distracted from taking good care of yourself. It is a good idea to be aware of your stress level and make changes that are necessary to help you better manage challenging situations. Support groups, planned relaxation, a hobby you enjoy, meditation, healthy relationships, and exercise all work to lower your stress level. If your efforts do not seem to be helping, get help from your health care provider or a trained mental health professional. Document Released: 11/09/2010 Document Revised: 07/08/2013 Document Reviewed: 04/17/2013 [facility] Patient Information 2015 [place], [place]. This information is not intended to replace advice given to you by your health care provider. Make sure you discuss any questions you have with your health care provider. Diabetes and Standards of Medical Care Diabetes is complicated. You may find that your diabetes team includes a dietitian, nurse, diabetes educator, eye doctor, and more. To help everyone know what is going on and to help you get the care you deserve, the following  schedule of care was developed to help keep you on track. Below are the tests, exams, vaccines, medicines, education, and plans you will need. HbA1c test This test shows how well you have controlled your glucose over the past 2-3 months. It is used to see if your diabetes management plan needs to be adjusted.  It is performed at least 2 times a year if you are meeting treatment goals. It is performed 4 times a year if therapy has changed or if you are not meeting treatment goals. Blood pressure test This test is performed at every routine medical visit. The goal is less than 140/90 mm Hg for most people, but 130/80 mm Hg in some cases. Ask your health care provider about your goal. Dental exam Follow up with the dentist regularly. Eye exam If you are diagnosed with type 1 diabetes as a child, get an exam upon reaching the age of 29 years or older and have had diabetes for 3-5 years. Yearly eye exams are recommended after that initial eye exam. If you are diagnosed with type 1 diabetes as an adult, get an exam within 5 years of diagnosis and then yearly. If you are diagnosed with type 2 diabetes, get an exam as soon as possible after the diagnosis and then yearly. Foot care exam Visual foot exams are performed at every routine medical visit. The exams check for cuts, injuries, or other problems with the feet. A comprehensive foot exam should be done yearly. This includes visual inspection as well as assessing foot pulses and testing for loss of sensation.  Check your feet nightly for cuts, injuries, or other problems with your feet. Tell your health care provider if anything is not healing. Kidney function test (urine microalbumin) This test is performed once a year. Type 1 diabetes: The first test is performed 5 years after diagnosis. Type 2 diabetes: The first test is performed at the time of diagnosis. A serum creatinine and estimated glomerular filtration rate (eGFR) test is done once a year  to assess the level of chronic kidney disease (CKD), if present. Lipid profile (cholesterol, HDL, LDL, triglycerides) Performed every 5 years for most people. The goal for LDL is less than 100 mg/dL. If you are at high risk, the goal is less than 70 mg/dL. The goal for HDL is 40 mg/dL-50 mg/dL for men and 50 mg/dL-60 mg/dL for women. An HDL cholesterol of 60 mg/dL or higher gives some protection against heart disease. The goal for triglycerides is less than 150 mg/dL. Influenza vaccine, pneumococcal vaccine, and hepatitis B vaccine The influenza vaccine is recommended yearly. It is recommended that people with diabetes who are over 66 years old get the pneumonia vaccine. In some cases, two separate shots may be given. Ask your health care provider if your pneumonia vaccination is up to date. The hepatitis B vaccine is also recommended for adults with diabetes. Diabetes self-management education Education is recommended at diagnosis and ongoing as needed. Treatment plan Your treatment plan is reviewed at every medical visit. Document Released: 12/19/2008 Document Revised: 07/08/2013 Document Reviewed: 07/24/2012 [facility] Patient Information 2015 [place], [place]. This information is not intended to replace advice given to you by your health care provider. Make sure you discuss any questions you have with your health care provider.

## 2014-06-13 NOTE — Progress Notes (Addendum)
Subjective:    Patient ID: Melissa Chavez, female    DOB: 07-31-1960, 54 y.o.   MRN: [id number] This chart was scribed for Norberto Sorenson, MD by Littie Deeds, Medical Scribe. This patient was seen in Room 25 and the patient's care was started at 8:38 AM.   Chief Complaint  Patient presents with  . Annual Exam    HPI HPI Comments: Melissa Chavez is a 54 y.o. female with a hx of HTN, GERD, and asthma who presents to the [facility] for an office visit.  She is transferring her primary care to here. Her prior PCP was through health service when she did not have insurance. She is now medically disabled. This is the first time meeting the patient. She has not had any routine lab work within our system. No prior mammogram. Tetanus was 10 years ago. Pneumax was 11 years ago. She does have a hx of abnormal pap smear; last pap in Epic was 10 years ago, which showed ASCUS. No prior colonoscopy. History of pre-diabetes; last HGB A1C 2 years ago was 6.4. Patient on lisinopril-HCTZ , 20-12.5 mg. No prior blood pressures in office have been at goal.  Hypertension: Patient states she has been compliant with her medications; she has been taking her lisinopril-HCTZ once a day. She has been taking her blood pressure at home. She did have a BP reading as low as 125/80 before. Patient states she has not felt bad due to her blood pressure. She does note intermittent ankle swelling, typically towards the end of the day and when the weather is warmer. She reports drinking about 64 oz of water per day because she knows drinking water is healthy.  Urinary Urgency/Nocturia: She also notes having urinary urgency and about 3 episodes of nocturia per night since having a UTI. Patient had been treated for the UTI and notes that her urine never returned to normal. She reports having had 2 vaginal deliveries. She had been seen by a urologist in the past, but she had been dismissed.  Asthma: Patient states her  asthma is under control. She has been having to use her albuterol inhaler more often recently due to allergy season. She has been taking the Claritin and Advair in the morning. Patient has not been using any nasal sprays.   History of Pre-Diabetes: Patient had been on metformin in the past for pre-diabetes. She states she did not have any problems with it.  GERD: She has been taking OTC medications to control her GERD.  Left Thumb Pain: Patient reports having pain to her left thumb for the past 1-2 weeks. She also reports associated thumb stiffness. She does have a FMHx of arthritis.  Health Maintenance: Her last eye exam was in June, 2015. Patient does report feeling sluggish occasionally. She does not take any vitamins or baby aspirin. She did not get the flu vaccine this season. Patient notes that she has had normal pap smears since her abnormal pap smear, although she did not remember having an abnormal pap smear. She had her last mammogram at [facility] and plans to have future mammograms done there. She has not had a colonoscopy done. Patient denies hx of smoking, but she does have ongoing passive smoke exposure. She has a PSHx of 2 umbilical hernia repairs.   Patient is fasting. She has been on Medicare for 15 years.   Review of Systems  All other systems reviewed and are negative.  Complete ROS  per patient health survey.     Objective:  BP 148/92 mmHg  Pulse 93  Temp(Src) 98.3 F (36.8 C) (Oral)  Resp 16  Ht 5' 1.75" (1.568 m)  Wt 224 lb 3.2 oz (101.696 kg)  BMI 41.36 kg/m2  SpO2 96%  Physical Exam  Constitutional: She is oriented to person, place, and time. She appears well-developed and well-nourished. No distress.  HENT:  Head: Normocephalic and atraumatic.  Mouth/Throat: Oropharynx is clear and moist. No oropharyngeal exudate.  Eyes: Pupils are equal, round, and reactive to light.  Neck: Neck supple. No thyromegaly present.  Normal thyroid. No lymphadenopathy.   Cardiovascular: Regular rhythm, S1 normal and S2 normal.  Tachycardia present.   No murmur heard. Pulmonary/Chest: Effort normal.  Decreased breath sounds throughout in all lung fields.  Abdominal:  Bowel sounds heard in chest.  Musculoskeletal: She exhibits tenderness. She exhibits no edema.  Left hand: tenderness over proximal base of the 1st metacarpal. No snuffbox tenderness. No extensor tenderness. [facility]. Right hand: normal.  Lymphadenopathy:    She has no cervical adenopathy.  Neurological: She is alert and oriented to person, place, and time. No cranial nerve deficit.  Skin: Skin is warm and dry. No rash noted.  Psychiatric: She has a normal mood and affect. Her behavior is normal.  Vitals reviewed.  [facility] reading (PRIMARY) by  Dr. Clelia CroftShaw. Left hand: some mild osteoarthritis change at [facility], no acute abnormality, nothing to explain point tenderness along first metacarpal.  Dg Hand 2 View Left  06/13/2014   CLINICAL DATA:  Pain along the left first metacarpal.  EXAM: LEFT HAND - 2 VIEW  COMPARISON:  Left wrist radiographs 10/19/2007  FINDINGS: No acute bone or soft tissue abnormalities are present. A remote lobar styloid fracture is again seen. No radiopaque foreign body is present.  IMPRESSION: 1. No acute abnormality. 2. Remote ulnar styloid fracture.   Electronically Signed   By: Marin Robertshristopher  Mattern M.D.   On: 06/13/2014 18:13       Assessment & Plan:   Essential hypertension, benign - Plan: POCT urinalysis dipstick - Increase lisinopril-HCTZ 20-12.5 mg to 40-25 mg and split apart due to concern for HCTZ worsening urinary symptoms.   Elevated glucose - Plan: TSH  Type 2 diabetes mellitus with other specified complication - Plan: POCT glycosylated hemoglobin (Hb A1C), POCT glucose (manual entry), POCT urinalysis dipstick, Microalbumin/Creatinine Ratio, Urine - NEW DIAGNOSIS TODAY.  reminded to f/u w/ optho. Start bid metformin 500 and increase to 1000  bid if tolerating well after 1-2 wks.  Hyperlipidemia LDL goal <100 - Plan: Lipid panel - start pravastatin 40  Urinary incontinence, urge - Plan: POCT CBC, POCT UA - Microscopic Only, POCT urinalysis dipstick, Comprehensive metabolic panel, HIV antibody - Will increase Ditropan; if patient does not have adequate response, we may need to try brand name only.   Nocturia more than twice per night  Gastroesophageal reflux disease, esophagitis presence not specified - Plan: Hepatitis C antibody -Rec starting PPI, okay to use OTC if dose is doubled. If symptoms persist at follow-up, consider checking for hiatal hernial along with GI referral as patient needs colonoscopy as well.   Dysphagia - Plan: Hepatitis C antibody  Pedal edema  Thumb pain, left - Plan: Vit D  25 hydroxy (rtn osteoporosis monitoring), DG Hand 2 View Left  Need for prophylactic vaccination with combined diphtheria-tetanus-pertussis (DTP) vaccine - Recommend tetanus shot with next injury.   Asthma, chronic, moderate persistent,  uncomplicated - Will switch Claritin to Zyrtec. Increase Advair to twice a day.  Start flonase at night. Continue singulair at night.  Other fatigue - Plan: POCT CBC, TSH, Vit D  25 hydroxy (rtn osteoporosis monitoring), Comprehensive metabolic panel, HIV antibody, Hepatitis C antibody  Disorder of bone - Plan: Vit D  25 hydroxy (rtn osteoporosis monitoring)  - Take 1200mg  of calcium CITRATE (citracal, caltrate) divided into two daily doses along with vitamin D.  Urination frequency - Plan: albuterol (VENTOLIN HFA) 108 (90 BASE) MCG/ACT inhaler  Cystitis - Plan: albuterol (VENTOLIN HFA) 108 (90 BASE) MCG/ACT inhaler  Asthma with acute exacerbation, mild intermittent - Plan: albuterol (VENTOLIN HFA) 108 (90 BASE) MCG/ACT inhaler  Screening for breast cancer -will refer to mammogram at [facility], but was having trouble getting it covered.  Postmenopausal estrogen deficiency  HM - start  asa 81mg  EC qd. resched for medicare wellness exam - unable to complete today due to time needed to review chronic med problems, new sxs, and renew meds during her visit to est today.  Vit D def - start high dose replacement x 9 mos, then increase otc daily  Meds ordered this encounter  Medications  . lisinopril (PRINIVIL,ZESTRIL) 40 MG tablet    Sig: Take 1 tablet (40 mg total) by mouth daily.    Dispense:  90 tablet    Refill:  0  . hydrochlorothiazide (HYDRODIURIL) 25 MG tablet    Sig: Take 1 tablet (25 mg total) by mouth daily.    Dispense:  90 tablet    Refill:  0  . oxybutynin (DITROPAN-XL) 10 MG 24 hr tablet    Sig: Take 1 tablet (10 mg total) by mouth at bedtime.    Dispense:  30 tablet    Refill:  5  . cetirizine (ZYRTEC) 10 MG tablet    Sig: Take 1 tablet (10 mg total) by mouth at bedtime.    Dispense:  30 tablet    Refill:  11  . montelukast (SINGULAIR) 10 MG tablet    Sig: Take 1 tablet (10 mg total) by mouth at bedtime.    Dispense:  90 tablet    Refill:  1  . albuterol (VENTOLIN HFA) 108 (90 BASE) MCG/ACT inhaler    Sig: Inhale 2 puffs into the lungs every 4 (four) hours as needed for wheezing or shortness of breath.    Dispense:  3 Inhaler    Refill:  3  . esomeprazole (NEXIUM) 40 MG capsule    Sig: Take 1 capsule (40 mg total) by mouth daily. 30 minutes prior to a meal    Dispense:  30 capsule    Refill:  1  . fluticasone (FLONASE) 50 MCG/ACT nasal spray    Sig: Place 2 sprays into both nostrils at bedtime.    Dispense:  16 g    Refill:  2  . metFORMIN (GLUCOPHAGE) 500 MG tablet    Sig: Take 1 tablet (500 mg total) by mouth 2 (two) times daily with a meal. Increase to 2 tablets twice a day in 1 week if tolerating    Dispense:  180 tablet    Refill:  3    I personally performed the services described in this documentation, which was scribed in my presence. The recorded information has been reviewed and considered, and addended by me as needed.  Norberto Sorenson, MD  [facility]   Results for orders placed or performed in visit on 06/13/14  TSH  Result Value Ref  Range   TSH 0.837 0.350 - 4.500 uIU/mL  Vit D  25 hydroxy (rtn osteoporosis monitoring)  Result Value Ref Range   Vit D, 25-Hydroxy 10 (L) 30 - 100 ng/mL  Lipid panel  Result Value Ref Range   Cholesterol 237 (H) 0 - 200 mg/dL   Triglycerides 161 (H) <150 mg/dL   HDL 28 (L) >=09 mg/dL   Total CHOL/HDL Ratio 8.5 Ratio   VLDL 57 (H) 0 - 40 mg/dL   LDL Cholesterol 604 (H) 0 - 99 mg/dL  Comprehensive metabolic panel  Result Value Ref Range   Sodium 134 (L) 135 - 145 mEq/L   Potassium 4.4 3.5 - 5.3 mEq/L   Chloride 96 96 - 112 mEq/L   CO2 29 19 - 32 mEq/L   Glucose, Bld 343 (H) 70 - 99 mg/dL   BUN 9 6 - 23 mg/dL   Creat 5.40 9.81 - 1.91 mg/dL   Total Bilirubin 0.4 0.2 - 1.2 mg/dL   Alkaline Phosphatase 122 (H) 39 - 117 U/L   AST 10 0 - 37 U/L   ALT 11 0 - 35 U/L   Total Protein 7.6 6.0 - 8.3 g/dL   Albumin 3.7 3.5 - 5.2 g/dL   Calcium 9.3 8.4 - 47.8 mg/dL  HIV antibody  Result Value Ref Range   HIV 1&2 Ab, 4th Generation NONREACTIVE NONREACTIVE  Hepatitis C antibody  Result Value Ref Range   HCV Ab NEGATIVE NEGATIVE  Microalbumin/Creatinine Ratio, Urine  Result Value Ref Range   Microalb, Ur 0.6 <2.0 mg/dL   Creatinine, Urine 29.5 mg/dL   Microalb Creat Ratio 6.5 0.0 - 30.0 mg/g  POCT CBC  Result Value Ref Range   WBC 11.2 (A) 4.6 - 10.2 K/uL   Lymph, poc 3.2 0.6 - 3.4   POC LYMPH PERCENT 28.8 10 - 50 %L   MID (cbc) 0.4 0 - 0.9   POC MID % 3.2 0 - 12 %M   POC Granulocyte 7.6 (A) 2 - 6.9   Granulocyte percent 68.0 37 - 80 %G   RBC 4.82 4.04 - 5.48 M/uL   Hemoglobin 13.3 12.2 - 16.2 g/dL   HCT, POC 62.1 30.8 - 47.9 %   MCV 83.7 80 - 97 fL   MCH, POC 27.56 27 - 31.2 pg   MCHC 33.0 31.8 - 35.4 g/dL   RDW, POC 65.7 %   Platelet Count, POC 292 142 - 424 K/uL   MPV 8.7 0 - 99.8 fL  POCT glycosylated hemoglobin (Hb A1C)  Result Value Ref Range   Hemoglobin A1C 14.0   POCT  glucose (manual entry)  Result Value Ref Range   POC Glucose 358 (A) 70 - 99 mg/dl  POCT UA - Microscopic Only  Result Value Ref Range   WBC, Ur, HPF, POC 1-2    RBC, urine, microscopic 3-4    Bacteria, U Microscopic 1+    Mucus, UA pos    Epithelial cells, urine per micros 2-4    Crystals, Ur, HPF, POC neg    Casts, Ur, LPF, POC neg    Yeast, UA neg   POCT urinalysis dipstick  Result Value Ref Range   Color, UA yellow    Clarity, UA clear    Glucose, UA 1000    Bilirubin, UA neg    Ketones, UA neg    Spec Grav, UA 1.010    Blood, UA neg    pH, UA 5.5    Protein, UA neg  Urobilinogen, UA 0.2    Nitrite, UA neg    Leukocytes, UA Negative

## 2014-06-14 LAB — MICROALBUMIN / CREATININE URINE RATIO
Creatinine, Urine: 92 mg/dL
Microalb Creat Ratio: 6.5 mg/g (ref 0.0–30.0)
Microalb, Ur: 0.6 mg/dL (ref <2.0)

## 2014-06-16 ENCOUNTER — Telehealth: Payer: Self-pay

## 2014-06-16 NOTE — Telephone Encounter (Signed)
Normal, she broke her wrist on the pinky side a very long time ago - healed normally.  If she signs up for my chart, she can see the xray results herself as well.

## 2014-06-16 NOTE — Telephone Encounter (Signed)
Patient is wanting to know the results from her x-ray done on Friday.     Best#: [id number]

## 2014-06-17 NOTE — Telephone Encounter (Signed)
Advised pt of results.

## 2014-06-22 MED ORDER — PRAVASTATIN SODIUM 40 MG PO TABS: 40.0000 mg | ORAL_TABLET | Freq: Every day | ORAL | Status: DC

## 2014-06-22 MED ORDER — ERGOCALCIFEROL 1.25 MG (50000 UT) PO CAPS
50000.0000 [IU] | ORAL_CAPSULE | ORAL | Status: DC
Start: 2014-06-22 — End: 2015-01-25

## 2014-06-30 ENCOUNTER — Other Ambulatory Visit: Payer: Self-pay | Admitting: Family Medicine

## 2014-07-02 ENCOUNTER — Other Ambulatory Visit: Payer: Self-pay | Admitting: Emergency Medicine

## 2014-07-06 ENCOUNTER — Other Ambulatory Visit: Payer: Self-pay | Admitting: Emergency Medicine

## 2014-07-11 ENCOUNTER — Ambulatory Visit: Payer: Medicare Other | Admitting: Family Medicine

## 2014-08-31 ENCOUNTER — Other Ambulatory Visit: Payer: Self-pay | Admitting: Family Medicine

## 2014-09-01 ENCOUNTER — Other Ambulatory Visit: Payer: Self-pay

## 2014-10-09 DIAGNOSIS — E119 Type 2 diabetes mellitus without complications: Secondary | ICD-10-CM | POA: Diagnosis not present

## 2014-11-26 ENCOUNTER — Other Ambulatory Visit: Payer: Self-pay

## 2014-11-26 MED ORDER — OXYBUTYNIN CHLORIDE ER 10 MG PO TB24: 10.0000 mg | ORAL_TABLET | Freq: Every day | ORAL | Status: DC

## 2014-11-27 ENCOUNTER — Ambulatory Visit: Payer: Medicare Other | Admitting: Family Medicine

## 2014-11-28 ENCOUNTER — Encounter: Payer: Self-pay | Admitting: Family Medicine

## 2014-11-28 ENCOUNTER — Other Ambulatory Visit: Payer: Self-pay | Admitting: Family Medicine

## 2014-12-19 ENCOUNTER — Other Ambulatory Visit: Payer: Self-pay | Admitting: Physician Assistant

## 2014-12-24 ENCOUNTER — Other Ambulatory Visit: Payer: Self-pay | Admitting: Family Medicine

## 2015-01-03 ENCOUNTER — Other Ambulatory Visit: Payer: Self-pay | Admitting: Family Medicine

## 2015-01-23 ENCOUNTER — Encounter: Payer: Self-pay | Admitting: Family Medicine

## 2015-01-23 ENCOUNTER — Ambulatory Visit (INDEPENDENT_AMBULATORY_CARE_PROVIDER_SITE_OTHER): Payer: Commercial Managed Care - HMO | Admitting: Family Medicine

## 2015-01-23 VITALS — BP 160/85 | HR 85 | Temp 98.2°F | Resp 16 | Ht 61.25 in | Wt 227.0 lb

## 2015-01-23 DIAGNOSIS — N3281 Overactive bladder: Secondary | ICD-10-CM

## 2015-01-23 DIAGNOSIS — E876 Hypokalemia: Secondary | ICD-10-CM

## 2015-01-23 DIAGNOSIS — M79606 Pain in leg, unspecified: Secondary | ICD-10-CM

## 2015-01-23 DIAGNOSIS — E559 Vitamin D deficiency, unspecified: Secondary | ICD-10-CM | POA: Diagnosis not present

## 2015-01-23 DIAGNOSIS — E7800 Pure hypercholesterolemia, unspecified: Secondary | ICD-10-CM | POA: Diagnosis not present

## 2015-01-23 DIAGNOSIS — E1142 Type 2 diabetes mellitus with diabetic polyneuropathy: Secondary | ICD-10-CM

## 2015-01-23 DIAGNOSIS — J309 Allergic rhinitis, unspecified: Secondary | ICD-10-CM | POA: Diagnosis not present

## 2015-01-23 DIAGNOSIS — D509 Iron deficiency anemia, unspecified: Secondary | ICD-10-CM

## 2015-01-23 DIAGNOSIS — I10 Essential (primary) hypertension: Secondary | ICD-10-CM

## 2015-01-23 DIAGNOSIS — K219 Gastro-esophageal reflux disease without esophagitis: Secondary | ICD-10-CM | POA: Diagnosis not present

## 2015-01-23 LAB — COMPREHENSIVE METABOLIC PANEL
ALT: 13 U/L (ref 6–29)
AST: 13 U/L (ref 10–35)
Albumin: 4 g/dL (ref 3.6–5.1)
Alkaline Phosphatase: 103 U/L (ref 33–130)
BUN: 14 mg/dL (ref 7–25)
CO2: 26 mmol/L (ref 20–31)
Calcium: 9.4 mg/dL (ref 8.6–10.4)
Chloride: 100 mmol/L (ref 98–110)
Creat: 1.05 mg/dL (ref 0.50–1.05)
Glucose, Bld: 138 mg/dL — ABNORMAL HIGH (ref 65–99)
Potassium: 3.8 mmol/L (ref 3.5–5.3)
Sodium: 140 mmol/L (ref 135–146)
Total Bilirubin: 0.3 mg/dL (ref 0.2–1.2)
Total Protein: 7.9 g/dL (ref 6.1–8.1)

## 2015-01-23 LAB — POCT URINALYSIS DIP (MANUAL ENTRY)
Bilirubin, UA: NEGATIVE
Blood, UA: NEGATIVE
Glucose, UA: NEGATIVE
Ketones, POC UA: NEGATIVE
Nitrite, UA: NEGATIVE
Protein Ur, POC: NEGATIVE
Spec Grav, UA: 1.01
Urobilinogen, UA: 1
pH, UA: 6.5

## 2015-01-23 LAB — POC MICROSCOPIC URINALYSIS (UMFC): Mucus: ABSENT

## 2015-01-23 LAB — POCT GLYCOSYLATED HEMOGLOBIN (HGB A1C): Hemoglobin A1C: 7.7

## 2015-01-23 LAB — GLUCOSE, POCT (MANUAL RESULT ENTRY): POC Glucose: 139 mg/dl — AB (ref 70–99)

## 2015-01-23 LAB — LIPID PANEL
Cholesterol: 258 mg/dL — ABNORMAL HIGH (ref 125–200)
HDL: 39 mg/dL — ABNORMAL LOW (ref >=46)
LDL Cholesterol: 169 mg/dL — ABNORMAL HIGH (ref <130)
Total CHOL/HDL Ratio: 6.6 Ratio — ABNORMAL HIGH (ref <=5.0)
Triglycerides: 252 mg/dL — ABNORMAL HIGH (ref <150)
VLDL: 50 mg/dL — ABNORMAL HIGH (ref <30)

## 2015-01-23 LAB — HEMOGLOBIN A1C: Hgb A1c MFr Bld: 7.7 % — AB (ref 4.0–6.0)

## 2015-01-23 MED ORDER — OXYBUTYNIN CHLORIDE ER 15 MG PO TB24: 15.0000 mg | ORAL_TABLET | Freq: Every day | ORAL | Status: DC

## 2015-01-23 MED ORDER — TRIAMTERENE-HCTZ 37.5-25 MG PO TABS: 1.0000 | ORAL_TABLET | Freq: Every day | ORAL | Status: DC

## 2015-01-23 MED ORDER — METFORMIN HCL 1000 MG PO TABS: 1000.0000 mg | ORAL_TABLET | Freq: Two times a day (BID) | ORAL | Status: DC

## 2015-01-23 MED ORDER — TIZANIDINE HCL 4 MG PO TABS: 4.0000 mg | ORAL_TABLET | Freq: Every day | ORAL | Status: DC

## 2015-01-23 MED ORDER — PRAVASTATIN SODIUM 40 MG PO TABS: 40.0000 mg | ORAL_TABLET | Freq: Every day | ORAL | Status: DC

## 2015-01-23 MED ORDER — LISINOPRIL 40 MG PO TABS: 40.0000 mg | ORAL_TABLET | Freq: Every day | ORAL | Status: DC

## 2015-01-23 NOTE — Progress Notes (Deleted)
Subjective:    Patient ID: Melissa Chavez, female    DOB: Oct 27, 1960, 54 y.o.   MRN: [id number] This chart was scribed for Norberto Sorenson, MD by Littie Deeds, Medical Scribe. This patient was seen in Room 10 and the patient's care was started at 9:22 AM.   Chief Complaint  Patient presents with   Leg Pain    In the morning, bilateral, X 2 weeks   Medication Refill    Needs all meds refilled, wants something besides Nexium   Back Pain    Low, X 1 week    HPI HPI Comments: Melissa Chavez is a 54 y.o. female with a history of hypertension, hyperlipidemia, asthma, and GERD who presents to the [facility].  Bilateral leg pain: She is complaining of cramping, bilateral leg pain from the knees down to her feet that started about 2 weeks ago. This is not a burning pain. The pain is worse in the mornings when she tries to walk.  Hypertension: Patient notes her blood pressure has not been doing well, and that her feet have continued to swell. She has been taking her blood pressure medications.  Diabetes: Patient notes her blood sugar is typically <100 in the mornings, 80-90 during the day. She denies symptomatic lows/hypoglycemic symptoms. She has been taking metformin 2 tablets, twice a day.  Asthma: She has been taking the Advair, one puff twice a day. She has been taking the albuterol about 2-3 times a week. She uses 2 pillows at night. Patient denies chest pain, orthopnea, and SOB currently.  Hyperlipidemia: Patient notes she has not been taking her pravastatin due to side effect concerns. She is fasting today.  GERD: She heard some negative things about Nexium so she has stopped taking it. She would like something else instead.   Health maintenance: She is still taking the vitamin D. She has received her flu shot.   Review of Systems  Respiratory: Negative for shortness of breath.   Cardiovascular: Positive for leg swelling. Negative for chest pain.    Musculoskeletal: Positive for myalgias and back pain.  Neurological: Negative for dizziness, syncope and light-headedness.       Objective:  There were no vitals taken for this visit.  Physical Exam  Constitutional: She is oriented to person, place, and time. She appears well-developed and well-nourished. No distress.  HENT:  Head: Normocephalic and atraumatic.  Right Ear: A middle ear effusion is present.  Left Ear: A middle ear effusion is present.  Nose: Nose normal.  Mouth/Throat: Oropharynx is clear and moist and mucous membranes are normal. No oropharyngeal exudate, posterior oropharyngeal edema or posterior oropharyngeal erythema.  Eyes: Pupils are equal, round, and reactive to light.  Neck: Neck supple. No thyromegaly present.  Normal thyroid.  Cardiovascular: Regular rhythm, S2 normal and normal heart sounds.  Tachycardia present.   No murmur heard. Pulmonary/Chest: Effort normal. No respiratory distress.  Decreased air movement, but clear to auscultation bilaterally.   Musculoskeletal: She exhibits no edema.  Lymphadenopathy:    She has no cervical adenopathy.  Neurological: She is alert and oriented to person, place, and time. No cranial nerve deficit.  Skin: Skin is warm and dry. No rash noted.  Psychiatric: She has a normal mood and affect. Her behavior is normal.  Nursing note and vitals reviewed.  Results for orders placed or performed in visit on 01/23/15  POCT urinalysis dipstick  Result Value Ref Range   Color, UA yellow yellow  Clarity, UA hazy (A) clear   Glucose, UA negative negative   Bilirubin, UA negative negative   Ketones, POC UA negative negative   Spec Grav, UA 1.010    Blood, UA negative negative   pH, UA 6.5    Protein Ur, POC negative negative   Urobilinogen, UA 1.0    Nitrite, UA Negative Negative   Leukocytes, UA small (1+) (A) Negative  POCT Microscopic Urinalysis ([facility])  Result Value Ref Range   WBC,UR,HPF,POC Few (A) None WBC/hpf    RBC,UR,HPF,POC None None RBC/hpf   Bacteria Few (A) None, Too numerous to count   Mucus Absent Absent   Epithelial Cells, UR Per Microscopy None None, Too numerous to count cells/hpf  POCT glycosylated hemoglobin (Hb A1C)  Result Value Ref Range   Hemoglobin A1C 7.7   POCT glucose (manual entry)  Result Value Ref Range   POC Glucose 139 (A) 70 - 99 mg/dl          Assessment & Plan:   1. Essential hypertension   2. Gastroesophageal reflux disease, esophagitis presence not specified   3. HYPERCHOLESTEROLEMIA   4. ANEMIA, IRON DEFICIENCY   5. HYPOKALEMIA   6. Overactive bladder   7. Allergic rhinitis, unspecified allergic rhinitis type   8. Type 2 diabetes mellitus with diabetic polyneuropathy, without long-term current use of insulin (HCC)   9. Vitamin D deficiency     Orders Placed This Encounter  Procedures   Lipid panel    Order Specific Question:  Has the patient fasted?    Answer:  Yes   Comprehensive metabolic panel    Order Specific Question:  Has the patient fasted?    Answer:  Yes   VITAMIN D 25 Hydroxy (Vit-D Deficiency, Fractures)   POCT urinalysis dipstick   POCT Microscopic Urinalysis ([facility])   POCT glycosylated hemoglobin (Hb A1C)   POCT glucose (manual entry)    Meds ordered this encounter  Medications   metFORMIN (GLUCOPHAGE) 1000 MG tablet    Sig: Take 1 tablet (1,000 mg total) by mouth 2 (two) times daily with a meal. Increase to 2 tablets twice a day in 1 week if tolerating    Dispense:  180 tablet    Refill:  3   oxybutynin (DITROPAN XL) 15 MG 24 hr tablet    Sig: Take 1 tablet (15 mg total) by mouth at bedtime.    Dispense:  90 tablet    Refill:  1   triamterene-hydrochlorothiazide (MAXZIDE-25) 37.5-25 MG tablet    Sig: Take 1 tablet by mouth daily.    Dispense:  90 tablet    Refill:  1   pravastatin (PRAVACHOL) 40 MG tablet    Sig: Take 1 tablet (40 mg total) by mouth daily.    Dispense:  90 tablet    Refill:  1    lisinopril (PRINIVIL,ZESTRIL) 40 MG tablet    Sig: Take 1 tablet (40 mg total) by mouth daily.    Dispense:  90 tablet    Refill:  1   tiZANidine (ZANAFLEX) 4 MG tablet    Sig: Take 1 tablet (4 mg total) by mouth at bedtime.    Dispense:  30 tablet    Refill:  1    I personally performed the services described in this documentation, which was scribed in my presence. The recorded information has been reviewed and considered, and addended by me as needed.  Norberto Sorenson, MD MPH   By signing my name below, I, Littie Deeds, attest  that this documentation has been prepared under the direction and in the presence of Norberto SorensonEva Shaw, MD.  Electronically Signed: Littie Deedsichard Sun, Medical Scribe. 01/23/2015. 9:42 AM.   She has a full physical scheduled in one month. At the end of the visit, she brought up her back pain. She was advised to try ice and heat, and try muscle relaxant at night.

## 2015-01-23 NOTE — Patient Instructions (Signed)
Start a magnesium supplement at night and try to drink more water during the day.  Leg Cramps Leg cramps occur when a muscle or muscles tighten and you have no control over this tightening (involuntary muscle contraction). Muscle cramps can develop in any muscle, but the most common place is in the calf muscles of the leg. Those cramps can occur during exercise or when you are at rest. Leg cramps are painful, and they may last for a few seconds to a few minutes. Cramps may return several times before they finally stop. Usually, leg cramps are not caused by a serious medical problem. In many cases, the cause is not known. Some common causes include:  Overexertion.  Overuse from repetitive motions, or doing the same thing over and over.  Remaining in a certain position for a long period of time.  Improper preparation, form, or technique while performing a sport or an activity.  Dehydration.  Injury.  Side effects of some medicines.  Abnormally low levels of the salts and ions in your blood (electrolytes), especially potassium and calcium. These levels could be low if you are taking water pills (diuretics) or if you are pregnant. HOME CARE INSTRUCTIONS Watch your condition for any changes. Taking the following actions may help to lessen any discomfort that you are feeling:  Stay well-hydrated. Drink enough fluid to keep your urine clear or pale yellow.  Try massaging, stretching, and relaxing the affected muscle. Do this for several minutes at a time.  For tight or tense muscles, use a warm towel, heating pad, or hot shower water directed to the affected area.  If you are sore or have pain after a cramp, applying ice to the affected area may relieve discomfort.  Put ice in a plastic bag.  Place a towel between your skin and the bag.  Leave the ice on for 20 minutes, 2-3 times per day.  Avoid strenuous exercise for several days if you have been having frequent leg cramps.  Make  sure that your diet includes the essential minerals for your muscles to work normally.  Take medicines only as directed by your health care provider. SEEK MEDICAL CARE IF:  Your leg cramps get more severe or more frequent, or they do not improve over time.  Your foot becomes cold, numb, or blue.   This information is not intended to replace advice given to you by your health care provider. Make sure you discuss any questions you have with your health care provider.   Document Released: 03/31/2004 Document Revised: 07/08/2014 Document Reviewed: 01/29/2014 Elsevier Interactive Patient Education 2016 [employer] Inc[employer]  Blood Glucose Monitoring, Adult Monitoring your blood glucose (also know as blood sugar) helps you to manage your diabetes. It also helps you and your health care provider monitor your diabetes and determine how well your treatment plan is working. WHY SHOULD YOU MONITOR YOUR BLOOD GLUCOSE?  It can help you understand how food, exercise, and medicine affect your blood glucose.  It allows you to know what your blood glucose is at any given moment. You can quickly tell if you are having low blood glucose (hypoglycemia) or high blood glucose (hyperglycemia).  It can help you and your health care provider know how to adjust your medicines.  It can help you understand how to manage an illness or adjust medicine for exercise. WHEN SHOULD YOU TEST? Your health care provider will help you decide how often you should check your blood glucose. This may depend on the type  of diabetes you have, your diabetes control, or the types of medicines you are taking. Be sure to write down all of your blood glucose readings so that this information can be reviewed with your health care provider. See below for examples of testing times that your health care provider may suggest. Type 1 Diabetes  Test at least 2 times per day if your diabetes is well controlled, if you are using an insulin pump, or if  you perform multiple daily injections.  If your diabetes is not well controlled or if you are sick, you may need to test more often.  It is a good idea to also test:  Before every insulin injection.  Before and after exercise.  Between meals and 2 hours after a meal.  Occasionally between 2:00 a.m. and 3:00 a.m. Type 2 Diabetes  If you are taking insulin, test at least 2 times per day. However, it is best to test before every insulin injection.  If you take medicines by mouth (orally), test 2 times a day.  If you are on a controlled diet, test once a day.  If your diabetes is not well controlled or if you are sick, you may need to monitor more often. HOW TO MONITOR YOUR BLOOD GLUCOSE Supplies Needed  Blood glucose meter.  Test strips for your meter. Each meter has its own strips. You must use the strips that go with your own meter.  A pricking needle (lancet).  A device that holds the lancet (lancing device).  A journal or log book to write down your results. Procedure  Wash your hands with soap and water. Alcohol is not preferred.  Prick the side of your finger (not the tip) with the lancet.  Gently milk the finger until a small drop of blood appears.  Follow the instructions that come with your meter for inserting the test strip, applying blood to the strip, and using your blood glucose meter. Other Areas to Get Blood for Testing Some meters allow you to use other areas of your body (other than your finger) to test your blood. These areas are called alternative sites. The most common alternative sites are:  The forearm.  The thigh.  The back area of the lower leg.  The palm of the hand. The blood flow in these areas is slower. Therefore, the blood glucose values you get may be delayed, and the numbers are different from what you would get from your fingers. Do not use alternative sites if you think you are having hypoglycemia. Your reading will not be accurate.  Always use a finger if you are having hypoglycemia. Also, if you cannot feel your lows (hypoglycemia unawareness), always use your fingers for your blood glucose checks. ADDITIONAL TIPS FOR GLUCOSE MONITORING  Do not reuse lancets.  Always carry your supplies with you.  All blood glucose meters have a 24-hour "hotline" number to call if you have questions or need help.  Adjust (calibrate) your blood glucose meter with a control solution after finishing a few boxes of strips. BLOOD GLUCOSE RECORD KEEPING It is a good idea to keep a daily record or log of your blood glucose readings. Most glucose meters, if not all, keep your glucose records stored in the meter. Some meters come with the ability to download your records to your home computer. Keeping a record of your blood glucose readings is especially helpful if you are wanting to look for patterns. Make notes to go along with the blood glucose  readings because you might forget what happened at that exact time. Keeping good records helps you and your health care provider to work together to achieve good diabetes management.    This information is not intended to replace advice given to you by your health care provider. Make sure you discuss any questions you have with your health care provider.   Document Released: 02/24/2003 Document Revised: 03/14/2014 Document Reviewed: 07/16/2012 Elsevier Interactive Patient Education 2016 Elsevier Inc. Managing Your High Blood Pressure Blood pressure is a measurement of how forceful your blood is pressing against the walls of the arteries. Arteries are muscular tubes within the circulatory system. Blood pressure does not stay the same. Blood pressure rises when you are active, excited, or nervous; and it lowers during sleep and relaxation. If the numbers measuring your blood pressure stay above normal most of the time, you are at risk for health problems. High blood pressure (hypertension) is a long-term  (chronic) condition in which blood pressure is elevated. A blood pressure reading is recorded as two numbers, such as 120 over 80 (or 120/80). The first, higher number is called the systolic pressure. It is a measure of the pressure in your arteries as the heart beats. The second, lower number is called the diastolic pressure. It is a measure of the pressure in your arteries as the heart relaxes between beats.  Keeping your blood pressure in a normal range is important to your overall health and prevention of health problems, such as heart disease and stroke. When your blood pressure is uncontrolled, your heart has to work harder than normal. High blood pressure is a very common condition in adults because blood pressure tends to rise with age. Men and women are equally likely to have hypertension but at different times in life. Before age 8, men are more likely to have hypertension. After 55 years of age, women are more likely to have it. Hypertension is especially common in African Americans. This condition often has no signs or symptoms. The cause of the condition is usually not known. Your caregiver can help you come up with a plan to keep your blood pressure in a normal, healthy range. BLOOD PRESSURE STAGES Blood pressure is classified into four stages: normal, prehypertension, stage 1, and stage 2. Your blood pressure reading will be used to determine what type of treatment, if any, is necessary. Appropriate treatment options are tied to these four stages:  Normal  Systolic pressure (mm Hg): below 120.  Diastolic pressure (mm Hg): below 80. Prehypertension  Systolic pressure (mm Hg): 120 to 139.  Diastolic pressure (mm Hg): 80 to 89. Stage1  Systolic pressure (mm Hg): 140 to 159.  Diastolic pressure (mm Hg): 90 to 99. Stage2  Systolic pressure (mm Hg): 160 or above.  Diastolic pressure (mm Hg): 100 or above. RISKS RELATED TO HIGH BLOOD PRESSURE Managing your blood pressure is an  important responsibility. Uncontrolled high blood pressure can lead to:  A heart attack.  A stroke.  A weakened blood vessel (aneurysm).  Heart failure.  Kidney damage.  Eye damage.  Metabolic syndrome.  Memory and concentration problems. HOW TO MANAGE YOUR BLOOD PRESSURE Blood pressure can be managed effectively with lifestyle changes and medicines (if needed). Your caregiver will help you come up with a plan to bring your blood pressure within a normal range. Your plan should include the following: Education  Read all information provided by your caregivers about how to control blood pressure.  Educate yourself on the  latest guidelines and treatment recommendations. New research is always being done to further define the risks and treatments for high blood pressure. Lifestylechanges  Control your weight.  Avoid smoking.  Stay physically active.  Reduce the amount of salt in your diet.  Reduce stress.  Control any chronic conditions, such as high cholesterol or diabetes.  Reduce your alcohol intake. Medicines  Several medicines (antihypertensive medicines) are available, if needed, to bring blood pressure within a normal range. Communication  Review all the medicines you take with your caregiver because there may be side effects or interactions.  Talk with your caregiver about your diet, exercise habits, and other lifestyle factors that may be contributing to high blood pressure.  See your caregiver regularly. Your caregiver can help you create and adjust your plan for managing high blood pressure. RECOMMENDATIONS FOR TREATMENT AND FOLLOW-UP  The following recommendations are based on current guidelines for managing high blood pressure in nonpregnant adults. Use these recommendations to identify the proper follow-up period or treatment option based on your blood pressure reading. You can discuss these options with your caregiver.  Systolic pressure of 120 to 139  or diastolic pressure of 80 to 89: Follow up with your caregiver as directed.  Systolic pressure of 140 to 160 or diastolic pressure of 90 to 100: Follow up with your caregiver within 2 months.  Systolic pressure above 160 or diastolic pressure above 100: Follow up with your caregiver within 1 month.  Systolic pressure above 180 or diastolic pressure above 110: Consider antihypertensive therapy; follow up with your caregiver within 1 week.  Systolic pressure above 200 or diastolic pressure above 120: Begin antihypertensive therapy; follow up with your caregiver within 1 week.   This information is not intended to replace advice given to you by your health care provider. Make sure you discuss any questions you have with your health care provider.   Document Released: 11/16/2011 Document Reviewed: 11/16/2011 Elsevier Interactive Patient Education [employer]2016 [employer] Inc[employer]

## 2015-01-24 ENCOUNTER — Other Ambulatory Visit: Payer: Self-pay | Admitting: Family Medicine

## 2015-01-24 LAB — VITAMIN D 25 HYDROXY (VIT D DEFICIENCY, FRACTURES): Vit D, 25-Hydroxy: 32 ng/mL (ref 30–100)

## 2015-01-25 MED ORDER — METFORMIN HCL 1000 MG PO TABS: 1000.0000 mg | ORAL_TABLET | Freq: Two times a day (BID) | ORAL | Status: DC

## 2015-01-25 NOTE — Progress Notes (Signed)
Subjective:    Patient ID: Melissa Chavez, female    DOB: [birth year], 54 y.o.   MRN: [id number] This chart was scribed for Delman Cheadle, MD by Zola Button, Medical Scribe. This patient was seen in Room 10 and the patient's care was started at 9:22 AM.   Chief Complaint  Patient presents with  . Leg Pain    In the morning, bilateral, X 2 weeks  . Medication Refill    Needs all meds refilled, wants something besides Nexium  . Back Pain    Low, X 1 week    Leg Pain   Back Pain Associated symptoms include leg pain. Pertinent negatives include no abdominal pain, chest pain, fever or weakness.   HPI Comments: Melissa Chavez is a 54 y.o. female with a history of hypertension, hyperlipidemia, asthma, and GERD who presents to the [facility].  Bilateral leg pain: She is complaining of cramping, bilateral leg pain from the knees down to her feet that started about 2 weeks ago. This is not a burning pain. The pain is worse in the mornings when she tries to walk.  Hypertension: Patient notes her blood pressure has not been doing well, and that her feet have continued to swell. She has been taking her blood pressure medications.  Diabetes: Patient notes her blood sugar is typically <100 in the mornings, 80-90 during the day. She denies symptomatic lows/hypoglycemic symptoms. She has been taking metformin 2 tablets, twice a day.  Asthma: She has been taking the Advair, one puff twice a day. She has been taking the albuterol about 2-3 times a week. She uses 2 pillows at night. Patient denies chest pain, orthopnea, and SOB currently.  Hyperlipidemia: Patient notes she has not been taking her pravastatin due to potential side effect concerns that she read about but denies knowingly experiencing any side effects. Just didn't think it was a safe medicine to be on. She is fasting today.  GERD: She heard some negative things about Nexium so she has stopped taking it. She denies any  gerd/gastritis sxs.   Health maintenance: She is still taking the vitamin D. She has received her flu shot.  She has a CPE sched for next mo here.  Past Medical History  Diagnosis Date  . Hypertension   . Hernia   . Asthma   . Allergy   . Glaucoma    Current Outpatient Prescriptions on File Prior to Visit  Medication Sig Dispense Refill  . ADVAIR DISKUS 250-50 MCG/DOSE AEPB INHALE 1 PUFF INTO THE LUNGS 2 (TWO) TIMES DAILY. 180 each 1  . albuterol (VENTOLIN HFA) 108 (90 BASE) MCG/ACT inhaler Inhale 2 puffs into the lungs every 4 (four) hours as needed for wheezing or shortness of breath. 3 Inhaler 3  . blood glucose meter kit and supplies KIT Use as directed 4 times a day.  DX: R73.09 1 each 0  . cetirizine (ZYRTEC) 10 MG tablet Take 1 tablet (10 mg total) by mouth at bedtime. 30 tablet 11  . ergocalciferol (VITAMIN D2) 50000 UNITS capsule Take 1 capsule (50,000 Units total) by mouth once a week. 12 capsule 2  . glucose blood test strip Use as directed 4 times a day.  DX: R73.09 100 each 2  . Lancets MISC Use as directed 4 times a day.  DX: R73.09 100 each 2  . montelukast (SINGULAIR) 10 MG tablet TAKE 1 TABLET (10 MG TOTAL) BY MOUTH AT BEDTIME 90 tablet 0  No current facility-administered medications on file prior to visit.   Allergies  Allergen Reactions  . Fish Allergy Anaphylaxis  . Banana Other (See Comments)    unknown     Review of Systems  Constitutional: Positive for fatigue. Negative for fever, chills, activity change, appetite change and unexpected weight change.  Eyes: Negative for visual disturbance.  Respiratory: Negative for chest tightness, shortness of breath and wheezing.   Cardiovascular: Positive for leg swelling. Negative for chest pain and palpitations.  Gastrointestinal: Positive for diarrhea. Negative for nausea, vomiting, abdominal pain and blood in stool.  Musculoskeletal: Positive for myalgias, back pain, joint swelling and arthralgias. Negative for  gait problem.  Allergic/Immunologic: Negative for immunocompromised state.  Neurological: Negative for dizziness, syncope, weakness and light-headedness.  Hematological: Negative for adenopathy.  Psychiatric/Behavioral: Negative for dysphoric mood.       Objective:  BP 160/85 mmHg  Pulse 85  Temp(Src) 98.2 F (36.8 C) (Oral)  Resp 16  Ht 5' 1.25" (1.556 m)  Wt 227 lb (102.967 kg)  BMI 42.53 kg/m2  SpO2 97%  Physical Exam  Constitutional: She is oriented to person, place, and time. She appears well-developed and well-nourished. No distress.  HENT:  Head: Normocephalic and atraumatic.  Right Ear: A middle ear effusion is present.  Left Ear: A middle ear effusion is present.  Nose: Nose normal.  Mouth/Throat: Oropharynx is clear and moist and mucous membranes are normal. No oropharyngeal exudate, posterior oropharyngeal edema or posterior oropharyngeal erythema.  Eyes: Pupils are equal, round, and reactive to light.  Neck: Neck supple. No thyromegaly present.  Normal thyroid.  Cardiovascular: Regular rhythm, S2 normal and normal heart sounds.  Tachycardia present.   No murmur heard. Pulmonary/Chest: Effort normal. No respiratory distress.  Decreased air movement, but clear to auscultation bilaterally.   Musculoskeletal: She exhibits no edema.  Lymphadenopathy:    She has no cervical adenopathy.  Neurological: She is alert and oriented to person, place, and time. No cranial nerve deficit.  Skin: Skin is warm and dry. No rash noted.  Psychiatric: She has a normal mood and affect. Her behavior is normal.  Nursing note and vitals reviewed.   Diabetic Foot Exam - Simple   Simple Foot Form  Diabetic Foot exam was performed with the following findings:  Yes 01/23/2015  8:51 AM  Visual Inspection  Sensation Testing  Pulse Check  Comments  Monofilament normal      Results for orders placed or performed in visit on 01/23/15  Lipid panel  Result Value Ref Range   Cholesterol  258 (H) 125 - 200 mg/dL   Triglycerides 252 (H) <150 mg/dL   HDL 39 (L) >=46 mg/dL   Total CHOL/HDL Ratio 6.6 (H) <=5.0 Ratio   VLDL 50 (H) <30 mg/dL   LDL Cholesterol 169 (H) <130 mg/dL  Comprehensive metabolic panel  Result Value Ref Range   Sodium 140 135 - 146 mmol/L   Potassium 3.8 3.5 - 5.3 mmol/L   Chloride 100 98 - 110 mmol/L   CO2 26 20 - 31 mmol/L   Glucose, Bld 138 (H) 65 - 99 mg/dL   BUN 14 7 - 25 mg/dL   Creat 1.05 0.50 - 1.05 mg/dL   Total Bilirubin 0.3 0.2 - 1.2 mg/dL   Alkaline Phosphatase 103 33 - 130 U/L   AST 13 10 - 35 U/L   ALT 13 6 - 29 U/L   Total Protein 7.9 6.1 - 8.1 g/dL   Albumin 4.0 3.6 - 5.1  g/dL   Calcium 9.4 8.6 - 10.4 mg/dL  VITAMIN D 25 Hydroxy (Vit-D Deficiency, Fractures)  Result Value Ref Range   Vit D, 25-Hydroxy 32 30 - 100 ng/mL  POCT urinalysis dipstick  Result Value Ref Range   Color, UA yellow yellow   Clarity, UA hazy (A) clear   Glucose, UA negative negative   Bilirubin, UA negative negative   Ketones, POC UA negative negative   Spec Grav, UA 1.010    Blood, UA negative negative   pH, UA 6.5    Protein Ur, POC negative negative   Urobilinogen, UA 1.0    Nitrite, UA Negative Negative   Leukocytes, UA small (1+) (A) Negative  POCT Microscopic Urinalysis ([facility])  Result Value Ref Range   WBC,UR,HPF,POC Few (A) None WBC/hpf   RBC,UR,HPF,POC None None RBC/hpf   Bacteria Few (A) None, Too numerous to count   Mucus Absent Absent   Epithelial Cells, UR Per Microscopy None None, Too numerous to count cells/hpf  POCT glycosylated hemoglobin (Hb A1C)  Result Value Ref Range   Hemoglobin A1C 7.7   POCT glucose (manual entry)  Result Value Ref Range   POC Glucose 139 (A) 70 - 99 mg/dl          Assessment & Plan:   1. Essential hypertension - try to start checking bp outside of office since uncontrolled. Advance to maxzide from hctz. Cont lisinopril 40  2. Gastroesophageal reflux disease, esophagitis presence not specified -  doing well of ppi  3. HYPERCHOLESTEROLEMIA - reinforced importance of statin - restart pravastatin 40 and check lfts at f/u OV in 1-2 mos.  4. ANEMIA, IRON DEFICIENCY   5. HYPOKALEMIA - increase K in diet  6. Overactive bladder - increase ditropan from 10 to 15.   7. Allergic rhinitis, unspecified allergic rhinitis type   8. Type 2 diabetes mellitus with diabetic polyneuropathy, without long-term current use of insulin (HCC) - a1c improved from 14 -> 7.7 today!!  Cont metformin 1053m bid.  9. Vitamin D deficiency - replaced, cont otc supp.  10.  Lower ext pain - fortunately, we know it is not statin-induced myopathy as has been off pravastatin for so long - sxs atypical - more reminiscent of a tendon/muscle tightening, no claudication sxs. May be due to hypokalemia so increase K in diet, also may be exacerbated by peripheral edema (although most pain in a.m. Which is not consistent) so will change hctz to maxzide for better edema reduction and help maintain K.  Start trial of magnesium qhs.  If not helpful, can try zanaflax qhs. Recheck at CPE next mo.  Pt is sched to have a CPE her next mo  Orders Placed This Encounter  Procedures  . Lipid panel    Order Specific Question:  Has the patient fasted?    Answer:  Yes  . Comprehensive metabolic panel    Order Specific Question:  Has the patient fasted?    Answer:  Yes  . VITAMIN D 25 Hydroxy (Vit-D Deficiency, Fractures)  . POCT urinalysis dipstick  . POCT Microscopic Urinalysis ([facility])  . POCT glycosylated hemoglobin (Hb A1C)  . POCT glucose (manual entry)    Meds ordered this encounter  Medications  . metFORMIN (GLUCOPHAGE) 1000 MG tablet    Sig: Take 1 tablet (1,000 mg total) by mouth 2 (two) times daily with a meal. Increase to 2 tablets twice a day in 1 week if tolerating    Dispense:  180 tablet  Refill:  3  . oxybutynin (DITROPAN XL) 15 MG 24 hr tablet    Sig: Take 1 tablet (15 mg total) by mouth at bedtime.    Dispense:  90  tablet    Refill:  1  . triamterene-hydrochlorothiazide (MAXZIDE-25) 37.5-25 MG tablet    Sig: Take 1 tablet by mouth daily.    Dispense:  90 tablet    Refill:  1  . pravastatin (PRAVACHOL) 40 MG tablet    Sig: Take 1 tablet (40 mg total) by mouth daily.    Dispense:  90 tablet    Refill:  1  . lisinopril (PRINIVIL,ZESTRIL) 40 MG tablet    Sig: Take 1 tablet (40 mg total) by mouth daily.    Dispense:  90 tablet    Refill:  1  . tiZANidine (ZANAFLEX) 4 MG tablet    Sig: Take 1 tablet (4 mg total) by mouth at bedtime.    Dispense:  30 tablet    Refill:  1    I personally performed the services described in this documentation, which was scribed in my presence. The recorded information has been reviewed and considered, and addended by me as needed.  Delman Cheadle, MD MPH   By signing my name below, I, Zola Button, attest that this documentation has been prepared under the direction and in the presence of Delman Cheadle, MD.  Electronically Signed: Zola Button, Medical Scribe. 01/25/2015. 9:35 AM.   She has a full physical scheduled in one month. At the end of the visit, she brought up her back pain. She was advised to try ice and heat, and try muscle relaxant at night.

## 2015-01-26 ENCOUNTER — Other Ambulatory Visit: Payer: Self-pay

## 2015-01-31 ENCOUNTER — Other Ambulatory Visit: Payer: Self-pay | Admitting: Family Medicine

## 2015-02-02 ENCOUNTER — Other Ambulatory Visit: Payer: Self-pay | Admitting: Family Medicine

## 2015-02-04 ENCOUNTER — Encounter: Payer: Self-pay | Admitting: Family Medicine

## 2015-02-26 ENCOUNTER — Encounter: Payer: Self-pay | Admitting: Family Medicine

## 2015-02-26 ENCOUNTER — Ambulatory Visit (INDEPENDENT_AMBULATORY_CARE_PROVIDER_SITE_OTHER): Payer: Commercial Managed Care - HMO | Admitting: Family Medicine

## 2015-02-26 VITALS — BP 118/72 | HR 93 | Temp 97.3°F | Resp 16 | Ht 61.75 in | Wt 226.2 lb

## 2015-02-26 DIAGNOSIS — Z0000 Encounter for general adult medical examination without abnormal findings: Secondary | ICD-10-CM

## 2015-02-26 DIAGNOSIS — I10 Essential (primary) hypertension: Secondary | ICD-10-CM

## 2015-02-26 DIAGNOSIS — B9689 Other specified bacterial agents as the cause of diseases classified elsewhere: Secondary | ICD-10-CM

## 2015-02-26 DIAGNOSIS — A499 Bacterial infection, unspecified: Secondary | ICD-10-CM

## 2015-02-26 DIAGNOSIS — Z1212 Encounter for screening for malignant neoplasm of rectum: Secondary | ICD-10-CM

## 2015-02-26 DIAGNOSIS — N898 Other specified noninflammatory disorders of vagina: Secondary | ICD-10-CM

## 2015-02-26 DIAGNOSIS — Z124 Encounter for screening for malignant neoplasm of cervix: Secondary | ICD-10-CM | POA: Diagnosis not present

## 2015-02-26 DIAGNOSIS — Z780 Asymptomatic menopausal state: Secondary | ICD-10-CM

## 2015-02-26 DIAGNOSIS — Z1383 Encounter for screening for respiratory disorder NEC: Secondary | ICD-10-CM

## 2015-02-26 DIAGNOSIS — Z1389 Encounter for screening for other disorder: Secondary | ICD-10-CM

## 2015-02-26 DIAGNOSIS — N760 Acute vaginitis: Secondary | ICD-10-CM

## 2015-02-26 DIAGNOSIS — Z1239 Encounter for other screening for malignant neoplasm of breast: Secondary | ICD-10-CM

## 2015-02-26 DIAGNOSIS — E7800 Pure hypercholesterolemia, unspecified: Secondary | ICD-10-CM

## 2015-02-26 DIAGNOSIS — Z136 Encounter for screening for cardiovascular disorders: Secondary | ICD-10-CM | POA: Diagnosis not present

## 2015-02-26 DIAGNOSIS — J4540 Moderate persistent asthma, uncomplicated: Secondary | ICD-10-CM

## 2015-02-26 DIAGNOSIS — J309 Allergic rhinitis, unspecified: Secondary | ICD-10-CM

## 2015-02-26 DIAGNOSIS — Z1211 Encounter for screening for malignant neoplasm of colon: Secondary | ICD-10-CM

## 2015-02-26 LAB — POCT WET + KOH PREP
Trich by wet prep: ABSENT
Yeast by KOH: ABSENT
Yeast by wet prep: ABSENT

## 2015-02-26 NOTE — Patient Instructions (Signed)
Melissa Chavez , Thank you for taking time to come for your Medicare Wellness Visit. I appreciate your ongoing commitment to your health goals. Please review the following plan we discussed and let me know if I can assist you in the future.   These are the goals we discussed: Goals    None      This is a list of the screening recommended for you and due dates:  Health Maintenance  Topic Date Due  . Pap Smear  02/04/1981  . Pneumococcal vaccine (2) 08/05/2009  . Colon Cancer Screening  02/04/2010  . Mammogram  08/06/2014  . Hemoglobin A1C  07/23/2015  . Flu Shot  10/06/2015  . Eye exam for diabetics  10/09/2015  . Tetanus Vaccine  12/06/2015  . Complete foot exam   01/23/2016  .  Hepatitis C: One time screening is recommended by Center for Disease Control  (CDC) for  adults born from [birth year] through 1966.   Completed  . HIV Screening  Completed   Menopause is a normal process in which your reproductive ability comes to an end. This process happens gradually over a span of months to years, usually between the ages of 21 and 26. Menopause is complete when you have missed 12 consecutive menstrual periods. It is important to talk with your health care provider about some of the most common conditions that affect postmenopausal women, such as heart disease, cancer, and bone loss (osteoporosis). Adopting a healthy lifestyle and getting preventive care can help to promote your health and wellness. Those actions can also lower your chances of developing some of these common conditions. WHAT SHOULD I KNOW ABOUT MENOPAUSE? During menopause, you may experience a number of symptoms, such as:  Moderate-to-severe hot flashes.  Night sweats.  Decrease in sex drive.  Mood swings.  Headaches.  Tiredness.  Irritability.  Memory problems.  Insomnia. Choosing to treat or not to treat menopausal changes is an individual decision that you make with your health care provider. WHAT SHOULD I KNOW  ABOUT HORMONE REPLACEMENT THERAPY AND SUPPLEMENTS? Hormone therapy products are effective for treating symptoms that are associated with menopause, such as hot flashes and night sweats. Hormone replacement carries certain risks, especially as you become older. If you are thinking about using estrogen or estrogen with progestin treatments, discuss the benefits and risks with your health care provider. WHAT SHOULD I KNOW ABOUT HEART DISEASE AND STROKE? Heart disease, heart attack, and stroke become more likely as you age. This may be due, in part, to the hormonal changes that your body experiences during menopause. These can affect how your body processes dietary fats, triglycerides, and cholesterol. Heart attack and stroke are both medical emergencies. There are many things that you can do to help prevent heart disease and stroke:  Have your blood pressure checked at least every 1-2 years. High blood pressure causes heart disease and increases the risk of stroke.  If you are 68-41 years old, ask your health care provider if you should take aspirin to prevent a heart attack or a stroke.  Do not use any tobacco products, including cigarettes, chewing tobacco, or electronic cigarettes. If you need help quitting, ask your health care provider.  It is important to eat a healthy diet and maintain a healthy weight.  Be sure to include plenty of vegetables, fruits, low-fat dairy products, and lean protein.  Avoid eating foods that are high in solid fats, added sugars, or salt (sodium).  Get regular exercise. This is  one of the most important things that you can do for your health.  Try to exercise for at least 150 minutes each week. The type of exercise that you do should increase your heart rate and make you sweat. This is known as moderate-intensity exercise.  Try to do strengthening exercises at least twice each week. Do these in addition to the moderate-intensity exercise.  Know your numbers.Ask  your health care provider to check your cholesterol and your blood glucose. Continue to have your blood tested as directed by your health care provider. WHAT SHOULD I KNOW ABOUT CANCER SCREENING? There are several types of cancer. Take the following steps to reduce your risk and to catch any cancer development as early as possible. Breast Cancer  Practice breast self-awareness.  This means understanding how your breasts normally appear and feel.  It also means doing regular breast self-exams. Let your health care provider know about any changes, no matter how small.  If you are 40 or older, have a clinician do a breast exam (clinical breast exam or CBE) every year. Depending on your age, family history, and medical history, it may be recommended that you also have a yearly breast X-ray (mammogram).  If you have a family history of breast cancer, talk with your health care provider about genetic screening.  If you are at high risk for breast cancer, talk with your health care provider about having an MRI and a mammogram every year.  Breast cancer (BRCA) gene test is recommended for women who have family members with BRCA-related cancers. Results of the assessment will determine the need for genetic counseling and BRCA1 and for BRCA2 testing. BRCA-related cancers include these types:  Breast. This occurs in males or females.  Ovarian.  Tubal. This may also be called fallopian tube cancer.  Cancer of the abdominal or pelvic lining (peritoneal cancer).  Prostate.  Pancreatic. Cervical, Uterine, and Ovarian Cancer Your health care provider may recommend that you be screened regularly for cancer of the pelvic organs. These include your ovaries, uterus, and vagina. This screening involves a pelvic exam, which includes checking for microscopic changes to the surface of your cervix (Pap test).  For women ages 21-65, health care providers may recommend a pelvic exam and a Pap test every three  years. For women ages 53-65, they may recommend the Pap test and pelvic exam, combined with testing for human papilloma virus (HPV), every five years. Some types of HPV increase your risk of cervical cancer. Testing for HPV may also be done on women of any age who have unclear Pap test results.  Other health care providers may not recommend any screening for nonpregnant women who are considered low risk for pelvic cancer and have no symptoms. Ask your health care provider if a screening pelvic exam is right for you.  If you have had past treatment for cervical cancer or a condition that could lead to cancer, you need Pap tests and screening for cancer for at least 20 years after your treatment. If Pap tests have been discontinued for you, your risk factors (such as having a new sexual partner) need to be reassessed to determine if you should start having screenings again. Some women have medical problems that increase the chance of getting cervical cancer. In these cases, your health care provider may recommend that you have screening and Pap tests more often.  If you have a family history of uterine cancer or ovarian cancer, talk with your health care provider  about genetic screening.  If you have vaginal bleeding after reaching menopause, tell your health care provider.  There are currently no reliable tests available to screen for ovarian cancer. Lung Cancer Lung cancer screening is recommended for adults 41-54 years old who are at high risk for lung cancer because of a history of smoking. A yearly low-dose CT scan of the lungs is recommended if you:  Currently smoke.  Have a history of at least 30 pack-years of smoking and you currently smoke or have quit within the past 15 years. A pack-year is smoking an average of one pack of cigarettes per day for one year. Yearly screening should:  Continue until it has been 15 years since you quit.  Stop if you develop a health problem that would  prevent you from having lung cancer treatment. Colorectal Cancer  This type of cancer can be detected and can often be prevented.  Routine colorectal cancer screening usually begins at age 53 and continues through age 90.  If you have risk factors for colon cancer, your health care provider may recommend that you be screened at an earlier age.  If you have a family history of colorectal cancer, talk with your health care provider about genetic screening.  Your health care provider may also recommend using home test kits to check for hidden blood in your stool.  A small camera at the end of a tube can be used to examine your colon directly (sigmoidoscopy or colonoscopy). This is done to check for the earliest forms of colorectal cancer.  Direct examination of the colon should be repeated every 5-10 years until age 95. However, if early forms of precancerous polyps or small growths are found or if you have a family history or genetic risk for colorectal cancer, you may need to be screened more often. Skin Cancer  Check your skin from head to toe regularly.  Monitor any moles. Be sure to tell your health care provider:  About any new moles or changes in moles, especially if there is a change in a mole's shape or color.  If you have a mole that is larger than the size of a pencil eraser.  If any of your family members has a history of skin cancer, especially at a young age, talk with your health care provider about genetic screening.  Always use sunscreen. Apply sunscreen liberally and repeatedly throughout the day.  Whenever you are outside, protect yourself by wearing long sleeves, pants, a wide-brimmed hat, and sunglasses. WHAT SHOULD I KNOW ABOUT OSTEOPOROSIS? Osteoporosis is a condition in which bone destruction happens more quickly than new bone creation. After menopause, you may be at an increased risk for osteoporosis. To help prevent osteoporosis or the bone fractures that can  happen because of osteoporosis, the following is recommended:  If you are 36-30 years old, get at least 1,000 mg of calcium and at least 600 mg of vitamin D per day.  If you are older than age 25 but younger than age 53, get at least 1,200 mg of calcium and at least 600 mg of vitamin D per day.  If you are older than age 66, get at least 1,200 mg of calcium and at least 800 mg of vitamin D per day. Smoking and excessive alcohol intake increase the risk of osteoporosis. Eat foods that are rich in calcium and vitamin D, and do weight-bearing exercises several times each week as directed by your health care provider. WHAT SHOULD I KNOW  ABOUT HOW MENOPAUSE AFFECTS MY MENTAL HEALTH? Depression may occur at any age, but it is more common as you become older. Common symptoms of depression include:  Low or sad mood.  Changes in sleep patterns.  Changes in appetite or eating patterns.  Feeling an overall lack of motivation or enjoyment of activities that you previously enjoyed.  Frequent crying spells. Talk with your health care provider if you think that you are experiencing depression. WHAT SHOULD I KNOW ABOUT IMMUNIZATIONS? It is important that you get and maintain your immunizations. These include:  Tetanus, diphtheria, and pertussis (Tdap) booster vaccine.  Influenza every year before the flu season begins.  Pneumonia vaccine.  Shingles vaccine. Your health care provider may also recommend other immunizations.   This information is not intended to replace advice given to you by your health care provider. Make sure you discuss any questions you have with your health care provider.   Document Released: 04/15/2005 Document Revised: 03/14/2014 Document Reviewed: 10/24/2013 Elsevier Interactive Patient Education [employer].

## 2015-02-26 NOTE — Progress Notes (Signed)
Patient ID: Melissa Chavez, female   DOB: 1960-08-26, 54 y.o.   MRN: [id number] Subjective:    Melissa Chavez is a 54 y.o. female who presents for Medicare Annual/Subsequent preventive examination.  Preventive Screening-Counseling & Management  Tobacco History  Smoking status  . Never Smoker   Smokeless tobacco  . Not on file     Problems Prior to Visit 1. HTN - was to start checking bp outside of office as uncontrolled. Increased hctz -> maxzide at last visit, on lisinopril 40 2.  DM - last seen 1 mo prior for review of chronic medical problems, on metformin 1059m bid a1c had decreased from 14 -> 7.7 last mo. Her last eye exam was in June, 2015. She has signed up for a clinical trial for DM next wk - lasts for 10 wks. 3.  Overactive bladder - we increased her ditropan from 10 to 15 at last OV 4.  HPL - pt restarted pravastatin 40 at visit 1-2 mos ago - to soon to get lipid panel 5.  Lower extremity myopathy - did not improve off statin for a mo, poss due to low K or edema, started trial of magnesium qhs and if failed then zanaflex qhs.  Has been on medicare since she was 39.  Current Problems (verified) Patient Active Problem List   Diagnosis Date Noted  . Morbid obesity ([place] 06/13/2013  . Overactive bladder 06/13/2013  . Allergic rhinitis 06/13/2013  . Asthma, moderate persistent 06/13/2013  . BIPOLAR DISORDER UNSPECIFIED 03/18/2008  . VAGINAL DISCHARGE 10/05/2007  . HYPOKALEMIA 08/24/2007  . PERIPHERAL EDEMA 07/03/2007  . CONTUSION, RIGHT FOOT 04/23/2007  . GERD 01/30/2007  . HYPERCHOLESTEROLEMIA 12/07/2006  . Essential hypertension 12/07/2006  . RHINITIS, ALLERGIC NEC 12/07/2006  . ASTHMA 12/07/2006  . KNEE PAIN, RIGHT 12/07/2006  . ANEMIA, IRON DEFICIENCY 01/06/2006    Medications Prior to Visit Current Outpatient Prescriptions on File Prior to Visit  Medication Sig Dispense Refill  . albuterol (VENTOLIN HFA) 108 (90 BASE) MCG/ACT inhaler Inhale 2 puffs into  the lungs every 4 (four) hours as needed for wheezing or shortness of breath. 3 Inhaler 3  . blood glucose meter kit and supplies KIT Use as directed 4 times a day.  DX: R73.09 1 each 0  . cetirizine (ZYRTEC) 10 MG tablet Take 1 tablet (10 mg total) by mouth at bedtime. 30 tablet 11  . Fluticasone-Salmeterol (ADVAIR DISKUS) 250-50 MCG/DOSE AEPB INHALE 1 PUFF INTO THE LUNGS 2 (TWO) TIMES DAILY 180 each 1  . glucose blood test strip Use as directed 4 times a day.  DX: R73.09 100 each 2  . Lancets MISC Use as directed 4 times a day.  DX: R73.09 100 each 2  . lisinopril (PRINIVIL,ZESTRIL) 40 MG tablet Take 1 tablet (40 mg total) by mouth daily. 90 tablet 1  . metFORMIN (GLUCOPHAGE) 1000 MG tablet Take 1 tablet (1,000 mg total) by mouth 2 (two) times daily with a meal. 180 tablet 3  . montelukast (SINGULAIR) 10 MG tablet TAKE 1 TABLET (10 MG TOTAL) BY MOUTH AT BEDTIME 90 tablet 0  . oxybutynin (DITROPAN XL) 15 MG 24 hr tablet Take 1 tablet (15 mg total) by mouth at bedtime. 90 tablet 1  . pravastatin (PRAVACHOL) 40 MG tablet Take 1 tablet (40 mg total) by mouth daily. 90 tablet 1  . tiZANidine (ZANAFLEX) 4 MG tablet Take 1 tablet (4 mg total) by mouth at bedtime. 30 tablet 1  . triamterene-hydrochlorothiazide (MAXZIDE-25) 37.5-25 MG tablet  Take 1 tablet by mouth daily. 90 tablet 1   No current facility-administered medications on file prior to visit.    Current Medications (verified) Current Outpatient Prescriptions  Medication Sig Dispense Refill  . albuterol (VENTOLIN HFA) 108 (90 BASE) MCG/ACT inhaler Inhale 2 puffs into the lungs every 4 (four) hours as needed for wheezing or shortness of breath. 3 Inhaler 3  . blood glucose meter kit and supplies KIT Use as directed 4 times a day.  DX: R73.09 1 each 0  . cetirizine (ZYRTEC) 10 MG tablet Take 1 tablet (10 mg total) by mouth at bedtime. 30 tablet 11  . Fluticasone-Salmeterol (ADVAIR DISKUS) 250-50 MCG/DOSE AEPB INHALE 1 PUFF INTO THE LUNGS 2  (TWO) TIMES DAILY 180 each 1  . glucose blood test strip Use as directed 4 times a day.  DX: R73.09 100 each 2  . Lancets MISC Use as directed 4 times a day.  DX: R73.09 100 each 2  . lisinopril (PRINIVIL,ZESTRIL) 40 MG tablet Take 1 tablet (40 mg total) by mouth daily. 90 tablet 1  . metFORMIN (GLUCOPHAGE) 1000 MG tablet Take 1 tablet (1,000 mg total) by mouth 2 (two) times daily with a meal. 180 tablet 3  . montelukast (SINGULAIR) 10 MG tablet TAKE 1 TABLET (10 MG TOTAL) BY MOUTH AT BEDTIME 90 tablet 0  . oxybutynin (DITROPAN XL) 15 MG 24 hr tablet Take 1 tablet (15 mg total) by mouth at bedtime. 90 tablet 1  . pravastatin (PRAVACHOL) 40 MG tablet Take 1 tablet (40 mg total) by mouth daily. 90 tablet 1  . tiZANidine (ZANAFLEX) 4 MG tablet Take 1 tablet (4 mg total) by mouth at bedtime. 30 tablet 1  . triamterene-hydrochlorothiazide (MAXZIDE-25) 37.5-25 MG tablet Take 1 tablet by mouth daily. 90 tablet 1   No current facility-administered medications for this visit.     Allergies (verified) Fish allergy and Banana   PAST HISTORY  Family History Family History  Problem Relation Age of Onset  . Hypertension Mother   . Diabetes Mother   . Hypertension Father     Social History Social History  Substance Use Topics  . Smoking status: Never Smoker   . Smokeless tobacco: Not on file  . Alcohol Use: No     Are there smokers in your home (other than you)? YES  Risk Factors Current exercise habits: The patient does not participate in regular exercise at present.  Dietary issues discussed: low carb - diabetic, heart health   Cardiac risk factors: diabetes mellitus, dyslipidemia, hypertension, obesity (BMI >= 30 kg/m2) and sedentary lifestyle.  Depression Screen (Note: if answer to either of the following is "Yes", a more complete depression screening is indicated)   Over the past two weeks, have you felt down, depressed or hopeless? No  Over the past two weeks, have you felt  little interest or pleasure in doing things? No  Have you lost interest or pleasure in daily life? No  Do you often feel hopeless? No  Do you cry easily over simple problems? No Depression screen [facility] 2/9 02/26/2015 01/23/2015 06/13/2014  Decreased Interest 0 0 0  Down, Depressed, Hopeless 0 0 0  PHQ - 2 Score 0 0 0     Activities of Daily Living In your present state of health, do you have any difficulty performing the following activities?:  Driving? No Managing money?  No Feeding yourself? No Getting from bed to chair? No Climbing a flight of stairs? No Preparing food and eating?:  No Bathing or showering? No Getting dressed: No Getting to the toilet? No Using the toilet:No Moving around from place to place: No In the past year have you fallen or had a near fall?:No   Are you sexually active?  No  Do you have more than one partner?  No  Hearing Difficulties: No Do you often ask people to speak up or repeat themselves? No Do you experience ringing or noises in your ears? No Do you have difficulty understanding soft or whispered voices? No   Do you feel that you have a problem with memory? No  Do you often misplace items? No  Do you feel safe at home?  Yes  Cognitive Testing  Alert? Yes  Normal Appearance?Yes  Oriented to person? Yes  Place? Yes   Time? Yes  Recall of three objects?  Yes  Can perform simple calculations? Yes  Displays appropriate judgment?Yes  Can read the correct time from a watch face?Yes   Advanced Directives have been discussed with the patient? Yes  List the Names of Other Physician/Practitioners you currently use: 1.  See Care teams  Indicate any recent Medical Services you may have received from other than [facility] providers in the past year (date may be approximate).  Immunization History  Administered Date(s) Administered  . Influenza Split 11/28/2014  . Influenza Whole 12/05/2005, 01/30/2007, 12/19/2007  . Pneumococcal Polysaccharide-23  08/05/2004  . Td 12/05/2005    Screening Tests Health Maintenance  Topic Date Due  . PAP SMEAR  02/04/1981  . PNEUMOCOCCAL POLYSACCHARIDE VACCINE (2) 08/05/2009  . MAMMOGRAM  02/04/2010  . COLONOSCOPY  02/04/2010  . HEMOGLOBIN A1C  07/23/2015  . INFLUENZA VACCINE  10/06/2015  . OPHTHALMOLOGY EXAM  10/09/2015  . TETANUS/TDAP  12/06/2015  . FOOT EXAM  01/23/2016  . Hepatitis C Screening  Completed  . HIV Screening  Completed    All answers were reviewed with the patient and necessary referrals were made:  Ines Warf, MD   02/26/2015   History reviewed: allergies, current medications, past family history, past medical history, past social history, past surgical history and problem list  Review of Systems A comprehensive review of systems was negative except for: Respiratory: positive for dyspnea on exertion Genitourinary: positive for frequency Musculoskeletal: positive for arthralgias Allergic/Immunologic: positive for hay fever and urticaria  seasonal and food   Objective:       Hearing Screening   125Hz  250Hz  500Hz  1000Hz  2000Hz  4000Hz  8000Hz   Right ear: 2        Left ear:           Visual Acuity Screening   Right eye Left eye Both eyes  Without correction:     With correction: 20/40 20/40 20/30    BP 118/72 mmHg  Pulse 93  Temp(Src) 97.3 F (36.3 C) (Oral)  Resp 16  Ht 5' 1.75" (1.568 m)  Wt 226 lb 3.2 oz (102.604 kg)  BMI 41.73 kg/m2  SpO2 98%   General Appearance:    Alert, cooperative, no distress, appears stated age  Head:    Normocephalic, without obvious abnormality, atraumatic  Eyes:    PERRL, conjunctiva/corneas clear, EOM's intact, fundi    benign, both eyes  Ears:    Normal TM's and external ear canals, both ears  Nose:   Nares normal, septum midline, mucosa normal, no drainage    or sinus tenderness  Throat:   Lips, mucosa, and tongue normal; teeth and gums normal  Neck:   Supple, symmetrical, trachea midline, no  adenopathy;    thyroid:  no  enlargement/tenderness/nodules; no carotid   bruit or JVD  Back:     Symmetric, no curvature, ROM normal, no CVA tenderness  Lungs:     Clear to auscultation bilaterally, respirations unlabored  Chest Wall:    No tenderness or deformity   Heart:    Regular rate and rhythm, S1 and S2 normal, no murmur, rub   or gallop  Breast Exam:    No tenderness, masses, or nipple abnormality  Abdomen:     Soft, non-tender, bowel sounds active all four quadrants,    no masses, no organomegaly  Genitalia:    Normal female without lesion, discharge or tenderness  Rectal:    Normal tone, normal prostate, no masses or tenderness;   guaiac negative stool  Extremities:   Extremities normal, atraumatic, no cyanosis or edema  Pulses:   2+ and symmetric all extremities  Skin:   Skin color, texture, turgor normal, no rashes or lesions  Lymph nodes:   Cervical, supraclavicular, and axillary nodes normal  Neurologic:   CNII-XII intact, normal strength, sensation and reflexes    throughout       Assessment:     1. Medicare annual wellness visit, subsequent - has [employer] coverage - will drop off formulary if needed  2. Screening for cervical cancer   3. Screening for breast cancer   4. Postmenopausal estrogen deficiency   5. Essential hypertension   6. Allergic rhinitis, unspecified allergic rhinitis type   7. Asthma, moderate persistent, uncomplicated   8. HYPERCHOLESTEROLEMIA   9. Morbid obesity, unspecified obesity type ([place])   10. Vaginal leukorrhea   11. Screening for cardiovascular, respiratory, and genitourinary diseases   12. Screening for colorectal cancer   13. Bacterial vaginosis          Plan:     During the course of the visit the patient was educated and counseled about appropriate screening and preventive services including:    Pneumococcal vaccine  - pneumovax 2006  Influenza vaccine - had this yr  Td vaccine - done 12/2005  Screening electrocardiogram - done 11/16  Screening  mammography - 2014 - done at [facility] and will cont doing there  Screening Pap smear and pelvic exam - done today, last was 2014.  ASC-US pap 01/06/2006 with many normal since  Bone densitometry screening - not indicated until 55 yo, reviewed ca/vit D and weight bearing exercise recs  Colorectal cancer screening - referred as none prior  Diabetes screening - has  Glaucoma screening - sees optho annually  Nutrition counseling   Had had the hep c and hiv    Patient notes that she has had normal pap smears since her abnormal pap smear, although she did not remember having an abnormal pap smear. She had her last mammogram at [facility] and plans to have future mammograms done there. She has not had a colonoscopy done. Patient denies hx of smoking, but she does have ongoing passive smoke exposure.    Diet review for nutrition referral? Yes ____  Not Indicated _x___   Patient Instructions (the written plan) was given to the patient.  Medicare Attestation I have personally reviewed: The patient's medical and social history Their use of alcohol, tobacco or illicit drugs Their current medications and supplements The patient's functional ability including ADLs,fall risks, home safety risks, cognitive, and hearing and visual impairment Diet and physical activities Evidence for depression or mood disorders  The patient's weight, height, BMI,  and visual acuity have been recorded in the chart.  I have made referrals, counseling, and provided education to the patient based on review of the above and I have provided the patient with a written personalized care plan for preventive services.     Delman Cheadle, MD   02/26/2015    Orders Placed This Encounter  Procedures  . POCT Wet + KOH Prep    Meds ordered this encounter  Medications  . VITAMIN D, CHOLECALCIFEROL, PO    Sig: Take by mouth once a week.  . metroNIDAZOLE (METROGEL VAGINAL) 0.75 % vaginal gel    Sig: Place 1  Applicatorful vaginally at bedtime. x5d    Dispense:  70 g    Refill:  0   Results for orders placed or performed in visit on 02/26/15  POCT Wet + KOH Prep  Result Value Ref Range   Yeast by KOH Absent Present, Absent   Yeast by wet prep Absent Present, Absent   WBC by wet prep Few None, Few, Too numerous to count   Clue Cells Wet Prep HPF POC Few (A) None, Too numerous to count   Trich by wet prep Absent Present, Absent   Bacteria Wet Prep HPF POC None None, Few, Too numerous to count   Epithelial Cells By [employer] Pref ([facility]) Moderate (A) None, Few, Too numerous to count   RBC,UR,HPF,POC None None RBC/hpf  Pap IG and HPV (high risk) DNA detection  Result Value Ref Range   HPV DNA High Risk Not Detected    Specimen adequacy: SEE NOTE    FINAL DIAGNOSIS: SEE NOTE    COMMENTS: SEE NOTE    Cytotechnologist: SEE NOTE    Pathologist: SEE NOTE

## 2015-02-27 LAB — PAP IG AND HPV HIGH-RISK: HPV DNA High Risk: NOT DETECTED

## 2015-02-27 MED ORDER — METRONIDAZOLE 0.75 % VA GEL
1.0000 | Freq: Every day | VAGINAL | Status: DC
Start: 2015-02-27 — End: 2015-03-20

## 2015-03-17 ENCOUNTER — Other Ambulatory Visit: Payer: Self-pay | Admitting: Physician Assistant

## 2015-03-20 ENCOUNTER — Ambulatory Visit (INDEPENDENT_AMBULATORY_CARE_PROVIDER_SITE_OTHER): Payer: Commercial Managed Care - HMO

## 2015-03-20 ENCOUNTER — Ambulatory Visit (INDEPENDENT_AMBULATORY_CARE_PROVIDER_SITE_OTHER): Payer: Commercial Managed Care - HMO | Admitting: Family Medicine

## 2015-03-20 VITALS — BP 120/80 | HR 110 | Temp 97.4°F | Resp 16 | Ht 60.0 in | Wt 227.0 lb

## 2015-03-20 DIAGNOSIS — R05 Cough: Secondary | ICD-10-CM | POA: Diagnosis not present

## 2015-03-20 DIAGNOSIS — M25561 Pain in right knee: Secondary | ICD-10-CM | POA: Diagnosis not present

## 2015-03-20 DIAGNOSIS — E1142 Type 2 diabetes mellitus with diabetic polyneuropathy: Secondary | ICD-10-CM | POA: Diagnosis not present

## 2015-03-20 DIAGNOSIS — R059 Cough, unspecified: Secondary | ICD-10-CM

## 2015-03-20 DIAGNOSIS — M79671 Pain in right foot: Secondary | ICD-10-CM

## 2015-03-20 MED ORDER — HYDROCODONE-ACETAMINOPHEN 5-325 MG PO TABS: 1.0000 | ORAL_TABLET | Freq: Four times a day (QID) | ORAL | Status: DC | PRN

## 2015-03-20 MED ORDER — INDOMETHACIN 50 MG PO CAPS: 50.0000 mg | ORAL_CAPSULE | Freq: Three times a day (TID) | ORAL | Status: DC

## 2015-03-20 NOTE — Progress Notes (Signed)
Patient ID: Melissa Chavez, female    DOB: [birth year], 55 y.o.   MRN: [id number]  PCP: Delman Cheadle, MD  Subjective:   Chief Complaint  Patient presents with  . Foot Swelling    rt. x 1 week   . Joint Swelling    rt. knee  . chest congestion    HPI Presents for evaluation of RIGHT knee and foot pain. Both for about a week. Isn't sure which started first. She has known osteoarthritis of the knee.  "I think this may be arthritis or gout. I think I've had both." About 6 days ago noted knee and foot pain. No identified triggers. Just had pain with walking when she got up that morning. Tried an OTC pain patch her mother had, without benefit. Tylenol, ibuprofen, tizanidine without benefit. Pain at rest, aching, 6/10. With weight bearing, pain increases to 8-9/10. Has had to borrow her mother's cane to ambulate, and even so has to go very slowly.  "I had a cold last week and I was coughing up a lot of phlegm. I was taking medicine and I thought I was getting better, but I'm still congested and coughing. Since I've been coughing so much, I've got spasms in my stomach (points to the LLQ), like a pulled muscle or something." Initially she had a runny nose and congestion, worst at night. Increase oral hydration. Still has a little bit of chest congestion with cough. No fever, chills, no nausea, vomiting or diarrhea. Some sinus pressure. No HA, dizziness.     Review of Systems As above.    Patient Active Problem List   Diagnosis Date Noted  . Type 2 diabetes mellitus with diabetic polyneuropathy, without long-term current use of insulin ([place]) 03/20/2015  . Morbid obesity ([place]) 06/13/2013  . Overactive bladder 06/13/2013  . Allergic rhinitis 06/13/2013  . Asthma, moderate persistent 06/13/2013  . BIPOLAR DISORDER UNSPECIFIED 03/18/2008  . Vaginal leukorrhea 10/05/2007  . HYPOKALEMIA 08/24/2007  . PERIPHERAL EDEMA 07/03/2007  . CONTUSION, RIGHT FOOT 04/23/2007  . GERD 01/30/2007  .  HYPERCHOLESTEROLEMIA 12/07/2006  . Essential hypertension 12/07/2006  . ASTHMA 12/07/2006  . KNEE PAIN, RIGHT 12/07/2006  . ANEMIA, IRON DEFICIENCY 01/06/2006     Prior to Admission medications   Medication Sig Start Date End Date Taking? Authorizing Provider  albuterol (VENTOLIN HFA) 108 (90 BASE) MCG/ACT inhaler Inhale 2 puffs into the lungs every 4 (four) hours as needed for wheezing or shortness of breath. 06/13/14  Yes Shawnee Knapp, MD  blood glucose meter kit and supplies KIT Use as directed 4 times a day.  DX: R73.09 06/13/14  Yes Shawnee Knapp, MD  Fluticasone-Salmeterol (ADVAIR DISKUS) 250-50 MCG/DOSE AEPB INHALE 1 PUFF INTO THE LUNGS 2 (TWO) TIMES DAILY 02/02/15  Yes Shawnee Knapp, MD  glucose blood test strip Use as directed 4 times a day.  DX: R73.09 06/13/14  Yes Shawnee Knapp, MD  Lancets MISC Use as directed 4 times a day.  DX: R73.09 06/13/14  Yes Shawnee Knapp, MD  lisinopril (PRINIVIL,ZESTRIL) 40 MG tablet Take 1 tablet (40 mg total) by mouth daily. 01/23/15  Yes Shawnee Knapp, MD  loratadine (CLARITIN) 10 MG tablet Take 10 mg by mouth daily.   Yes Historical Provider, MD  metFORMIN (GLUCOPHAGE) 1000 MG tablet Take 1 tablet (1,000 mg total) by mouth 2 (two) times daily with a meal. 01/25/15  Yes Shawnee Knapp, MD  oxybutynin (DITROPAN XL) 15 MG 24 hr tablet Take 1  tablet (15 mg total) by mouth at bedtime. 01/23/15  Yes Shawnee Knapp, MD  pravastatin (PRAVACHOL) 40 MG tablet Take 1 tablet (40 mg total) by mouth daily. 01/23/15  Yes Shawnee Knapp, MD  tiZANidine (ZANAFLEX) 4 MG tablet Take 1 tablet (4 mg total) by mouth at bedtime. 01/23/15  Yes Shawnee Knapp, MD  triamterene-hydrochlorothiazide (MAXZIDE-25) 37.5-25 MG tablet Take 1 tablet by mouth daily. 01/23/15  Yes Shawnee Knapp, MD  VITAMIN D, CHOLECALCIFEROL, PO Take by mouth once a week.   Yes Historical Provider, MD     Allergies  Allergen Reactions  . Fish Allergy Anaphylaxis  . Banana Other (See Comments)    unknown       Objective:  Physical  Exam     RIGHT Knee: [facility] reading (PRIMARY) by  Dr. Lorelei Pont. Marked degenerative disease, primarily of the patella.   RIGHT Foot: [facility] reading (PRIMARY) by  Dr. Lorelei Pont. Degenerative changes at the 1st MTP, possibly due to chronic gout; also degenerative change noted at the 1st MTT.      Assessment & Plan:   1. Right knee pain Osteoarthritis. NSAID and hydrocodone. Continue with assistive device. Refer to ortho for additional evaluation and treament. - DG Knee Complete 4 Views Right; Future - indomethacin (INDOCIN) 50 MG capsule; Take 1 capsule (50 mg total) by mouth 3 (three) times daily with meals.  Dispense: 30 capsule; Refill: 0 - HYDROcodone-acetaminophen (NORCO) 5-325 MG tablet; Take 1 tablet by mouth every 6 (six) hours as needed.  Dispense: 30 tablet; Refill: 0 - Ambulatory referral to Orthopedic Surgery  2. Right foot pain Possible gout, certainly some degenerative changes. NSAID and hydrocodone. Refer to ortho. - DG Foot Complete Right; Future - indomethacin (INDOCIN) 50 MG capsule; Take 1 capsule (50 mg total) by mouth 3 (three) times daily with meals.  Dispense: 30 capsule; Refill: 0 - HYDROcodone-acetaminophen (NORCO) 5-325 MG tablet; Take 1 tablet by mouth every 6 (six) hours as needed.  Dispense: 30 tablet; Refill: 0 - Ambulatory referral to Orthopedic Surgery  3. Cough Likely post-viral cough. Hydrocodone for pain will also help suppress cough. - HYDROcodone-acetaminophen (NORCO) 5-325 MG tablet; Take 1 tablet by mouth every 6 (six) hours as needed.  Dispense: 30 tablet; Refill: 0  4. Type 2 diabetes mellitus with diabetic polyneuropathy, without long-term current use of insulin ([place]) She desires to understand more about this disease process and had thought Dr. Brigitte Pulse was going to refer her at her last visit, but no one has called her. Referral made. - Ambulatory referral to diabetic education   Fara Chute, PA-C Physician Assistant-Certified Urgent  [place]

## 2015-03-22 ENCOUNTER — Encounter: Payer: Self-pay | Admitting: Physician Assistant

## 2015-03-22 DIAGNOSIS — M19071 Primary osteoarthritis, right ankle and foot: Secondary | ICD-10-CM | POA: Insufficient documentation

## 2015-03-25 ENCOUNTER — Other Ambulatory Visit: Payer: Self-pay | Admitting: Family Medicine

## 2015-03-28 ENCOUNTER — Other Ambulatory Visit: Payer: Self-pay | Admitting: Family Medicine

## 2015-04-02 ENCOUNTER — Other Ambulatory Visit: Payer: Self-pay | Admitting: Family Medicine

## 2015-05-04 ENCOUNTER — Telehealth: Payer: Self-pay

## 2015-05-04 NOTE — Telephone Encounter (Signed)
Pt states her insurance won't cover her ALBUTEROL 108 (BASE)

## 2015-05-07 ENCOUNTER — Other Ambulatory Visit: Payer: Self-pay

## 2015-05-07 DIAGNOSIS — J4521 Mild intermittent asthma with (acute) exacerbation: Secondary | ICD-10-CM

## 2015-05-07 DIAGNOSIS — R350 Frequency of micturition: Secondary | ICD-10-CM

## 2015-05-07 DIAGNOSIS — N3090 Cystitis, unspecified without hematuria: Secondary | ICD-10-CM

## 2015-05-07 MED ORDER — ALBUTEROL SULFATE HFA 108 (90 BASE) MCG/ACT IN AERS: 2.0000 | INHALATION_SPRAY | RESPIRATORY_TRACT | Status: DC | PRN

## 2015-05-11 MED ORDER — CETIRIZINE HCL 10 MG PO TABS: 10.0000 mg | ORAL_TABLET | Freq: Every day | ORAL | Status: DC

## 2015-05-11 NOTE — Telephone Encounter (Signed)
[id number], ph # [phone].

## 2015-05-11 NOTE — Telephone Encounter (Signed)
Pt called back to check status of PA. It looks like this message was not routed anywhere when taken. Pt advised that she has tried [employer] Air in the past and it does not work as well as the Ventolin that she has been using for years. Advised her that I will do a PA and let her know when I hear back from ins. Also pt needs a Rx for Zyrtec sent to [facility]. Done. PA also done for ventolin on covermymeds. Pending.

## 2015-05-12 ENCOUNTER — Telehealth: Payer: Self-pay

## 2015-05-12 ENCOUNTER — Other Ambulatory Visit: Payer: Self-pay

## 2015-05-12 MED ORDER — LISINOPRIL 40 MG PO TABS: 40.0000 mg | ORAL_TABLET | Freq: Every day | ORAL | Status: DC

## 2015-05-12 MED ORDER — TIZANIDINE HCL 4 MG PO TABS: ORAL_TABLET | ORAL | Status: DC

## 2015-05-12 MED ORDER — FLUTICASONE-SALMETEROL 250-50 MCG/DOSE IN AEPB: INHALATION_SPRAY | RESPIRATORY_TRACT | Status: DC

## 2015-05-12 MED ORDER — PRAVASTATIN SODIUM 40 MG PO TABS: 40.0000 mg | ORAL_TABLET | Freq: Every day | ORAL | Status: DC

## 2015-05-12 MED ORDER — OXYBUTYNIN CHLORIDE ER 15 MG PO TB24: 15.0000 mg | ORAL_TABLET | Freq: Every day | ORAL | Status: DC

## 2015-05-12 MED ORDER — METFORMIN HCL 1000 MG PO TABS: 1000.0000 mg | ORAL_TABLET | Freq: Two times a day (BID) | ORAL | Status: DC

## 2015-05-12 MED ORDER — TRIAMTERENE-HCTZ 37.5-25 MG PO TABS: 1.0000 | ORAL_TABLET | Freq: Every day | ORAL | Status: DC

## 2015-05-12 NOTE — Telephone Encounter (Signed)
PA approved through 03/06/2016. Notified pharm and pt.

## 2015-05-12 NOTE — Telephone Encounter (Signed)
Dr Clelia CroftShaw, I was going over med list w/pt to get her RFs and noticed that she does not have a current Rx for vit D. Pt stated that she assumed you didn't want her on it any more since you didn't get her a new Rx in Nov. She had been taking the 50000 units until then, and her results were 32 (ref 30-100). Is it fine for pt to not be taking any vit D now, do you want her taking a daily 2,000 unit tablet, or Rx 50,000 Qwk? Pt stated that she takes a [employer] multi vit daily.

## 2015-05-12 NOTE — Telephone Encounter (Signed)
Pt called and asked that her other meds be sent into [facility] also. I went over list w/pt and sent them in.

## 2015-05-13 NOTE — Telephone Encounter (Signed)
She should take otc 1000-2000u/d of otc vit D in addition to the mvi. Thanks.

## 2015-05-14 NOTE — Telephone Encounter (Signed)
Called and advised pt.

## 2015-05-15 ENCOUNTER — Other Ambulatory Visit: Payer: Self-pay | Admitting: Family Medicine

## 2015-05-15 MED ORDER — ALBUTEROL SULFATE 108 (90 BASE) MCG/ACT IN AEPB: 2.0000 | INHALATION_SPRAY | RESPIRATORY_TRACT | Status: DC | PRN

## 2015-05-20 ENCOUNTER — Other Ambulatory Visit: Payer: Self-pay

## 2015-05-20 MED ORDER — ALBUTEROL SULFATE HFA 108 (90 BASE) MCG/ACT IN AERS: INHALATION_SPRAY | RESPIRATORY_TRACT | Status: DC

## 2015-06-14 ENCOUNTER — Ambulatory Visit (INDEPENDENT_AMBULATORY_CARE_PROVIDER_SITE_OTHER): Payer: Medicare Other | Admitting: Physician Assistant

## 2015-06-14 VITALS — BP 124/80 | HR 115 | Temp 97.7°F

## 2015-06-14 DIAGNOSIS — M179 Osteoarthritis of knee, unspecified: Secondary | ICD-10-CM | POA: Diagnosis not present

## 2015-06-14 DIAGNOSIS — M1711 Unilateral primary osteoarthritis, right knee: Secondary | ICD-10-CM

## 2015-06-14 DIAGNOSIS — M25561 Pain in right knee: Secondary | ICD-10-CM | POA: Diagnosis not present

## 2015-06-14 MED ORDER — MELOXICAM 15 MG PO TABS: 15.0000 mg | ORAL_TABLET | Freq: Every day | ORAL | Status: DC

## 2015-06-14 MED ORDER — HYDROCODONE-ACETAMINOPHEN 5-325 MG PO TABS: 1.0000 | ORAL_TABLET | Freq: Four times a day (QID) | ORAL | Status: DC | PRN

## 2015-06-14 NOTE — Progress Notes (Signed)
Patient ID: ALFREDO COLLYMORE, female    DOB: [birth year], 55 y.o.   MRN: [id number]  PCP: Delman Cheadle, MD  Subjective:   Chief Complaint  Patient presents with  . Knee Pain    right    HPI Presents for evaluation of RIGHT knee pain.  I saw her for same in January, and prescribed indomethacin and Norco referred her to orthopedics for additional treatment of marked patellofemoral OA. She reports that no one contacted her regarding an appointment.  Pain pills don't help anymore. "They just make me constipated." Isn't sure why she stopped taking the Indocin. She recalls that she thinks it helped some.  Using a cane to ambulate.  Review of Systems  Constitutional: Negative for fever and chills.  Respiratory: Negative for cough and shortness of breath.   Cardiovascular: Negative for chest pain and palpitations.  Gastrointestinal: Negative for nausea and vomiting.  Musculoskeletal: Positive for joint swelling, arthralgias and gait problem.  Neurological: Negative for weakness and numbness.       Patient Active Problem List   Diagnosis Date Noted  . Osteoarthritis of right foot 03/22/2015  . Type 2 diabetes mellitus with diabetic polyneuropathy, without long-term current use of insulin ([place]) 03/20/2015  . Morbid obesity ([place]) 06/13/2013  . Overactive bladder 06/13/2013  . Allergic rhinitis 06/13/2013  . Asthma, moderate persistent 06/13/2013  . BIPOLAR DISORDER UNSPECIFIED 03/18/2008  . Vaginal leukorrhea 10/05/2007  . HYPOKALEMIA 08/24/2007  . PERIPHERAL EDEMA 07/03/2007  . CONTUSION, RIGHT FOOT 04/23/2007  . GERD 01/30/2007  . HYPERCHOLESTEROLEMIA 12/07/2006  . Essential hypertension 12/07/2006  . ASTHMA 12/07/2006  . Osteoarthritis of right knee 12/07/2006  . ANEMIA, IRON DEFICIENCY 01/06/2006     Prior to Admission medications   Medication Sig Start Date End Date Taking? Authorizing Provider  albuterol (VENTOLIN HFA) 108 (90 Base) MCG/ACT inhaler INHALE 2 PUFFS INTO  THE LUNGS EVERY 6 (SIX) HOURS AS NEEDED FOR WHEEZING. 05/20/15  Yes Shawnee Knapp, MD  blood glucose meter kit and supplies KIT Use as directed 4 times a day.  DX: R73.09 06/13/14  Yes Shawnee Knapp, MD  cetirizine (ZYRTEC) 10 MG tablet Take 1 tablet (10 mg total) by mouth at bedtime. 05/11/15  Yes Shawnee Knapp, MD  Fluticasone-Salmeterol (ADVAIR DISKUS) 250-50 MCG/DOSE AEPB INHALE 1 PUFF INTO THE LUNGS 2 (TWO) TIMES DAILY 05/12/15  Yes Shawnee Knapp, MD  glucose blood test strip Use as directed 4 times a day.  DX: R73.09 06/13/14  Yes Shawnee Knapp, MD  HYDROcodone-acetaminophen (NORCO) 5-325 MG tablet Take 1 tablet by mouth every 6 (six) hours as needed. 03/20/15  Yes Dawaun Brancato, PA-C  Lancets MISC Use as directed 4 times a day.  DX: R73.09 06/13/14  Yes Shawnee Knapp, MD  lisinopril (PRINIVIL,ZESTRIL) 40 MG tablet Take 1 tablet (40 mg total) by mouth daily. 05/12/15  Yes Shawnee Knapp, MD  loratadine (CLARITIN) 10 MG tablet Take 10 mg by mouth daily.   Yes Historical Provider, MD  metFORMIN (GLUCOPHAGE) 1000 MG tablet Take 1 tablet (1,000 mg total) by mouth 2 (two) times daily with a meal. 05/12/15  Yes Shawnee Knapp, MD  oxybutynin (DITROPAN XL) 15 MG 24 hr tablet Take 1 tablet (15 mg total) by mouth at bedtime. 05/12/15  Yes Shawnee Knapp, MD  pravastatin (PRAVACHOL) 40 MG tablet Take 1 tablet (40 mg total) by mouth daily. 05/12/15  Yes Shawnee Knapp, MD  tiZANidine (ZANAFLEX) 4 MG tablet TAKE 1 TABLET (  4 MG TOTAL) BY MOUTH AT BEDTIME. 05/12/15  Yes Shawnee Knapp, MD  triamterene-hydrochlorothiazide (MAXZIDE-25) 37.5-25 MG tablet Take 1 tablet by mouth daily. 05/12/15  Yes Shawnee Knapp, MD  VITAMIN D, CHOLECALCIFEROL, PO Take by mouth once a week.   Yes Historical Provider, MD  indomethacin (INDOCIN) 50 MG capsule Take 1 capsule (50 mg total) by mouth 3 (three) times daily with meals. 03/20/15   Harrison Mons, PA-C     Allergies  Allergen Reactions  . Fish Allergy Anaphylaxis  . Banana Other (See Comments)    unknown       Objective:    Physical Exam  Constitutional: She is oriented to person, place, and time. She appears well-developed and well-nourished. She is active and cooperative. No distress.  BP 124/80 mmHg  Pulse 115  Temp(Src) 97.7 F (36.5 C)  SpO2 98%   Eyes: Conjunctivae are normal.  Pulmonary/Chest: Effort normal.  Musculoskeletal:       Right knee: She exhibits decreased range of motion, swelling and effusion. She exhibits no ecchymosis, no deformity, no laceration, no erythema, normal alignment, no LCL laxity, normal patellar mobility, no bony tenderness, normal meniscus and no MCL laxity. Tenderness found. Medial joint line tenderness noted. No lateral joint line, no LCL and no patellar tendon tenderness noted.  Neurological: She is alert and oriented to person, place, and time. No sensory deficit.  Skin: Skin is warm, dry and intact.  Psychiatric: She has a normal mood and affect. Her speech is normal and behavior is normal.           Assessment & Plan:   1. Osteoarthritis of right knee, unspecified osteoarthritis type 2. Right knee pain She'd like to see Dr. Rhona Raider at [facility] as she has seen him previously for a different issue. Meloxicam. Perhaps with the readdition of anti-inflammatory, the hydrocodone will provide more benefit. - Ambulatory referral to Orthopedic Surgery - meloxicam (MOBIC) 15 MG tablet; Take 1 tablet (15 mg total) by mouth daily.  Dispense: 30 tablet; Refill: 0 - HYDROcodone-acetaminophen (NORCO) 5-325 MG tablet; Take 1 tablet by mouth every 6 (six) hours as needed.  Dispense: 30 tablet; Refill: 0   Fara Chute, PA-C Physician Assistant-Certified [facility]

## 2015-06-14 NOTE — Patient Instructions (Addendum)
Please call the Diabetes Education office back and schedule the class. I really think that it will help you better manage your diabetes.  If you have not heard from [facility] by med-week, please call them: [phone]    IF you received an x-ray today, you will receive an invoice from [facility]. Please contact [facility] at [phone] with questions or concerns regarding your invoice.   IF you received labwork today, you will receive an invoice from [facility]. Please contact [facility] at [phone] with questions or concerns regarding your invoice.   Our billing staff will not be able to assist you with questions regarding bills from these companies.  You will be contacted with the lab results as soon as they are available. The fastest way to get your results is to activate your My Chart account. Instructions are located on the last page of this paperwork. If you have not heard from us regarding the results in 2 weeks, please contact this office.     THE 3 SIMPLE RULES FOR NASAL SPRAY USE: 1. Don't snort. 2. Look at your toes and spray up your nose. 3. Use the opposite hand to spray in both nostrils.

## 2015-06-16 ENCOUNTER — Other Ambulatory Visit: Payer: Self-pay | Admitting: Family Medicine

## 2015-06-16 DIAGNOSIS — M79671 Pain in right foot: Secondary | ICD-10-CM

## 2015-06-16 DIAGNOSIS — M25561 Pain in right knee: Secondary | ICD-10-CM

## 2015-06-16 NOTE — Telephone Encounter (Signed)
Patient states her medication is not working and she wants to use the same thing as last time.  She has no idea the names of the medications.  It is for her knee pain.   [facility] on [address]   [phone]

## 2015-06-17 ENCOUNTER — Other Ambulatory Visit: Payer: Self-pay | Admitting: Physician Assistant

## 2015-06-17 NOTE — Telephone Encounter (Signed)
Chelle, did you read my note below about meloxicam not working and pt requesting to be put back on indomethacin? Just wanted to make sure you were aware of this info when you denied it and reason for denial is that you don't want pt to switch back?

## 2015-06-17 NOTE — Telephone Encounter (Signed)
Verified w/pt that she does NOT need any of these 6 RFs sent by [facility], she got them last month.  Clarified w/pt that the meloxicam Rxd on 4/9 is not working and she would like a RF of the indomethacin first Rxd by Dr Patsy Lageropland in Jan. I will pend RF. Pt does not need a CB if refilled, her pharm ([facility]) w/notify. Chelle, please review.

## 2015-06-18 ENCOUNTER — Telehealth: Payer: Self-pay | Admitting: Family Medicine

## 2015-06-18 ENCOUNTER — Other Ambulatory Visit: Payer: Self-pay | Admitting: Physician Assistant

## 2015-06-18 MED ORDER — INDOMETHACIN 50 MG PO CAPS: 50.0000 mg | ORAL_CAPSULE | Freq: Three times a day (TID) | ORAL | Status: DC | PRN

## 2015-06-18 NOTE — Telephone Encounter (Signed)
Pt called back and reported that she is just in a lot of pain, mostly in her knee but some swelling in her foot as well. She states that the meloxicam is not helping at all and wishes to have a RF of the indomethacin that Dr Patsy Lageropland had orig Rxd since it helped more. Dr Clelia CroftShaw, pt asked me to send this request to you in Chelle's absence since you are her PCP.

## 2015-06-18 NOTE — Telephone Encounter (Signed)
Pt advised that she has her ortho appt sch for 4/26. Dr Clelia CroftShaw, please see below.

## 2015-06-18 NOTE — Telephone Encounter (Signed)
Yes, fine to switch to indomethacin - just make sure she does not take until 24 hrs after her last meloxicam dose. Do not use with any other otc pain medication other than tylenol/acetaminophen - so no aleve, ibuprofen, motrin, advil, etc.

## 2015-06-18 NOTE — Telephone Encounter (Signed)
Spoke with patient and she was calling to see if her prescription was addressed.  I told her that you had 24 to 48 hours to address this issue but she is adamant that this will be addressed today.  She did state that if we do not call her prescription in tonight she will go to the er for the pain

## 2015-06-18 NOTE — Addendum Note (Signed)
Addended by: Sheppard PlumberBRIGGS, Barbarajean Kinzler A on: 06/18/2015 03:49 PM   Modules accepted: Orders

## 2015-06-19 NOTE — Telephone Encounter (Signed)
Called pt and she had called her pharm last night as I had suggested and found that Dr Clelia CroftShaw had sent in her Rx. She stated that her knee is starting to feel better already. Discussed  W/her  Dr Alver FisherShaw's instr's below about other NSAIDS and also not to take add'l tylenol if she has taken her hydrocodone, but fine to take if she is not taking the hydrocodone. Pt voiced understanding.

## 2015-06-30 ENCOUNTER — Other Ambulatory Visit: Payer: Self-pay | Admitting: Family Medicine

## 2015-06-30 ENCOUNTER — Ambulatory Visit (INDEPENDENT_AMBULATORY_CARE_PROVIDER_SITE_OTHER): Payer: Medicare Other | Admitting: Emergency Medicine

## 2015-06-30 VITALS — BP 148/82 | HR 78 | Temp 97.9°F | Resp 18 | Ht 60.0 in | Wt 236.0 lb

## 2015-06-30 DIAGNOSIS — R1013 Epigastric pain: Secondary | ICD-10-CM

## 2015-06-30 DIAGNOSIS — E1142 Type 2 diabetes mellitus with diabetic polyneuropathy: Secondary | ICD-10-CM | POA: Diagnosis not present

## 2015-06-30 DIAGNOSIS — G8929 Other chronic pain: Secondary | ICD-10-CM

## 2015-06-30 MED ORDER — OMEPRAZOLE 20 MG PO CPDR: 20.0000 mg | DELAYED_RELEASE_CAPSULE | Freq: Every day | ORAL | Status: DC

## 2015-06-30 MED ORDER — DICLOFENAC SODIUM 1 % TD GEL: 4.0000 g | Freq: Two times a day (BID) | TRANSDERMAL | Status: DC

## 2015-06-30 NOTE — Patient Instructions (Addendum)
Do not take your Mobic. Do not take your indomethacin. Please keep a log of your blood pressure. Please see the orthopedist for follow-up of your knee and elbow discomfort.    IF you received an x-ray today, you will receive an invoice from [facility]. Please contact [facility] at [phone] with questions or concerns regarding your invoice.   IF you received labwork today, you will receive an invoice from [facility]. Please contact [facility] at [phone] with questions or concerns regarding your invoice.   Our billing staff will not be able to assist you with questions regarding bills from these companies.  You will be contacted with the lab results as soon as they are available. The fastest way to get your results is to activate your My Chart account. Instructions are located on the last page of this paperwork. If you have not heard from us regarding the results in 2 weeks, please contact this office.

## 2015-06-30 NOTE — Progress Notes (Addendum)
Patient ID: KISMET FACEMIRE, female   DOB: [birth year], 54 y.o.   MRN: [id number]    By signing my name below, I, Essence Howell, attest that this documentation has been prepared under the direction and in the presence of Darlyne Russian, MD Electronically Signed: Ladene Artist, ED Scribe 06/30/2015 at 9:15 AM.  Chief Complaint:  Chief Complaint  Patient presents with  . Hypertension  . Headache    today  . Elbow Injury    left, on and off for a few weeks  . Knee Pain    right   HPI: WETONA VIRAMONTES is a 55 y.o. female, with a h/o HTN and DM, who reports to [facility] today for elevated BP for the past few days. Triage BP: 148/82; Repeat BP: 144/84. Pt states that she has been complaint with her BP medications but has noticed elevated readings since starting indomethacin on 06/18/15 for right knee pain. Her last dose was yesterday. She reports associated symptoms of HA, increased appetite, mild nausea and mild abdominal pain that is worsened with palpation. Pt denies fever, emesis, SOB, chest pain. Pt has an upcoming appointment with ortho for knee pain in 2 days on 07/02/15.   Left Elbow Pain Pt also presents with constant left elbow pain for the past few days. She attributes elbow pain to a flare-up of arthritis due to the weather change. She reports increased pain with movement and palpation. No treatments tried PTA.   Past Medical History  Diagnosis Date  . Hypertension   . Hernia   . Asthma   . Allergy   . Glaucoma   . Anemia   . Diabetes mellitus without complication ([place])   . GERD (gastroesophageal reflux disease)    Past Surgical History  Procedure Laterality Date  . Hernia repair    . Tubal ligation    . Tonsillectomy     Social History   Social History  . Marital Status: Single    Spouse Name: n/a  . Number of Children: 2  . Years of Education: Associates   Occupational History  . disabled     asthma   Social History Main Topics  . Smoking status: Never Smoker     . Smokeless tobacco: Never Used  . Alcohol Use: No  . Drug Use: No  . Sexual Activity: No   Other Topics Concern  . None   Social History Narrative   Lives with her daughter. Her other daughter lives on campus at college.   Family History  Problem Relation Age of Onset  . Hypertension Mother   . Diabetes Mother   . Hyperlipidemia Mother   . Stroke Mother   . Heart disease Mother   . Hypertension Father   . Diabetes Father   . Hyperlipidemia Father    Allergies  Allergen Reactions  . Fish Allergy Anaphylaxis  . Banana Other (See Comments)    unknown   Prior to Admission medications   Medication Sig Start Date End Date Taking? Authorizing Provider  albuterol (VENTOLIN HFA) 108 (90 Base) MCG/ACT inhaler INHALE 2 PUFFS INTO THE LUNGS EVERY 6 (SIX) HOURS AS NEEDED FOR WHEEZING. 05/20/15  Yes Shawnee Knapp, MD  blood glucose meter kit and supplies KIT Use as directed 4 times a day.  DX: R73.09 06/13/14  Yes Shawnee Knapp, MD  cetirizine (ZYRTEC) 10 MG tablet Take 1 tablet (10 mg total) by mouth at bedtime. 05/11/15  Yes Shawnee Knapp, MD  Fluticasone-Salmeterol (ADVAIR DISKUS)  250-50 MCG/DOSE AEPB INHALE 1 PUFF INTO THE LUNGS 2 (TWO) TIMES DAILY 05/12/15  Yes Shawnee Knapp, MD  glucose blood test strip Use as directed 4 times a day.  DX: R73.09 06/13/14  Yes Shawnee Knapp, MD  HYDROcodone-acetaminophen (NORCO) 5-325 MG tablet Take 1 tablet by mouth every 6 (six) hours as needed. 06/14/15  Yes Chelle Jeffery, PA-C  indomethacin (INDOCIN) 50 MG capsule Take 1 capsule (50 mg total) by mouth 3 (three) times daily as needed for moderate pain. 06/18/15  Yes Shawnee Knapp, MD  Lancets MISC Use as directed 4 times a day.  DX: R73.09 06/13/14  Yes Shawnee Knapp, MD  lisinopril (PRINIVIL,ZESTRIL) 40 MG tablet Take 1 tablet (40 mg total) by mouth daily. 05/12/15  Yes Shawnee Knapp, MD  loratadine (CLARITIN) 10 MG tablet Take 10 mg by mouth daily.   Yes Historical Provider, MD  meloxicam (MOBIC) 15 MG tablet Take 1 tablet (15 mg  total) by mouth daily. 06/14/15  Yes Chelle Jeffery, PA-C  metFORMIN (GLUCOPHAGE) 1000 MG tablet Take 1 tablet (1,000 mg total) by mouth 2 (two) times daily with a meal. 05/12/15  Yes Shawnee Knapp, MD  oxybutynin (DITROPAN XL) 15 MG 24 hr tablet Take 1 tablet (15 mg total) by mouth at bedtime. 05/12/15  Yes Shawnee Knapp, MD  pravastatin (PRAVACHOL) 40 MG tablet Take 1 tablet (40 mg total) by mouth daily. 05/12/15  Yes Shawnee Knapp, MD  tiZANidine (ZANAFLEX) 4 MG tablet TAKE 1 TABLET (4 MG TOTAL) BY MOUTH AT BEDTIME. 05/12/15  Yes Shawnee Knapp, MD  triamterene-hydrochlorothiazide (MAXZIDE-25) 37.5-25 MG tablet Take 1 tablet by mouth daily. 05/12/15  Yes Shawnee Knapp, MD  VITAMIN D, CHOLECALCIFEROL, PO Take by mouth once a week.   Yes Historical Provider, MD   ROS: The patient denies fevers, chills, night sweats, unintentional weight loss, chest pain, palpitations, wheezing, dyspnea on exertion, vomiting, dysuria, hematuria, melena, numbness, weakness, or tingling.   All other systems have been reviewed and were otherwise negative with the exception of those mentioned in the HPI and as above.    PHYSICAL EXAM: Filed Vitals:   06/30/15 0859  BP: 148/82  Pulse: 78  Temp: 97.9 F (36.6 C)  Resp: 18   Body mass index is 46.09 kg/(m^2).  General: Alert, no acute distress HEENT:  Normocephalic, atraumatic, oropharynx patent. Eye: [facility]te M[facility] Cardiovascular: Regular rate and rhythm, no rubs murmurs or gallops. No Carotid bruits, radial pulse intact. No pedal edema.  Respiratory: Clear to auscultation bilaterally. No wheezes, rales, or rhonchi. No cyanosis, no use of accessory musculature Abdominal: No organomegaly, abdomen is soft and non-tender, positive bowel sounds. No masses. Specifically there is no midepigastric tenderness. Musculoskeletal: Gait intact. No edema, there is mild tenderness over the left elbow Skin: No rashes. Neurologic: Facial musculature symmetric. Psychiatric: Patient acts appropriately  throughout our interaction. Lymphatic: No cervical or submandibular lymphadenopathy  LABS:  EKG/XRAY:   Primary read interpreted by [facility] Everlene Farrier [facility].  ASSESSMENT/PLAN: Patient's blood pressure is up. She has been taking indomethacin and then change to meloxicam. I suspect her blood pressure is elevated due to this. I suspect her GI discomfort is also secondary to these medications.. I switched her to diclofenac gel to apply to her knee twice a day. She will stop the meloxicam and indomethacin. She is scheduled to see her orthopedist for follow-up and will follow-up her knee pain and elbow pain at that time. She was also  placed on a PPI to take for 1 month.I personally performed the services described in this documentation, which was scribed in my presence. The recorded information has been reviewed and is accurate. She is due for colonoscopy 2019   Gross sideeffects, risk and benefits, and alternatives of medications d/w patient. Patient is aware that all medications have potential sideeffects and we are unable to predict every sideeffect or drug-drug interaction that may occur.  Arlyss Queen MD 06/30/2015 9:14 AM

## 2015-07-01 DIAGNOSIS — M1711 Unilateral primary osteoarthritis, right knee: Secondary | ICD-10-CM | POA: Diagnosis not present

## 2015-07-01 DIAGNOSIS — M19029 Primary osteoarthritis, unspecified elbow: Secondary | ICD-10-CM | POA: Diagnosis not present

## 2015-07-02 ENCOUNTER — Telehealth: Payer: Self-pay

## 2015-07-02 NOTE — Telephone Encounter (Signed)
Completed a PA for diclofenac gel yesterday and received letter from [facility] stating that PA was already approved from 03/08/2015 - 03/06/16, [id number]. Notified pt.

## 2015-07-05 ENCOUNTER — Ambulatory Visit (INDEPENDENT_AMBULATORY_CARE_PROVIDER_SITE_OTHER): Payer: Medicare Other | Admitting: Family Medicine

## 2015-07-05 VITALS — BP 146/94 | HR 76 | Temp 97.6°F | Resp 16 | Ht 62.0 in | Wt 233.0 lb

## 2015-07-05 DIAGNOSIS — I10 Essential (primary) hypertension: Secondary | ICD-10-CM

## 2015-07-05 DIAGNOSIS — M10022 Idiopathic gout, left elbow: Secondary | ICD-10-CM | POA: Diagnosis not present

## 2015-07-05 DIAGNOSIS — E11618 Type 2 diabetes mellitus with other diabetic arthropathy: Secondary | ICD-10-CM

## 2015-07-05 DIAGNOSIS — M25522 Pain in left elbow: Secondary | ICD-10-CM

## 2015-07-05 DIAGNOSIS — E1142 Type 2 diabetes mellitus with diabetic polyneuropathy: Secondary | ICD-10-CM

## 2015-07-05 DIAGNOSIS — D509 Iron deficiency anemia, unspecified: Secondary | ICD-10-CM

## 2015-07-05 DIAGNOSIS — M109 Gout, unspecified: Secondary | ICD-10-CM

## 2015-07-05 DIAGNOSIS — M7989 Other specified soft tissue disorders: Secondary | ICD-10-CM

## 2015-07-05 LAB — POCT CBC
Granulocyte percent: 79.5 %G (ref 37–80)
HCT, POC: 31.3 % — AB (ref 37.7–47.9)
Hemoglobin: 11 g/dL — AB (ref 12.2–16.2)
Lymph, poc: 3.2 (ref 0.6–3.4)
MCH, POC: 28.9 pg (ref 27–31.2)
MCHC: 35.2 g/dL (ref 31.8–35.4)
MCV: 80.2 fL (ref 80–97)
MID (cbc): 0.2 (ref 0–0.9)
MPV: 7.3 fL (ref 0–99.8)
POC Granulocyte: 13.4 — AB (ref 2–6.9)
POC LYMPH PERCENT: 19.1 %L (ref 10–50)
POC MID %: 1.4 %M (ref 0–12)
Platelet Count, POC: 382 10*3/uL (ref 142–424)
RBC: 3.9 M/uL — AB (ref 4.04–5.48)
RDW, POC: 15.8 %
WBC: 16.9 10*3/uL — AB (ref 4.6–10.2)

## 2015-07-05 LAB — GLUCOSE, POCT (MANUAL RESULT ENTRY): POC Glucose: 148 mg/dl — AB (ref 70–99)

## 2015-07-05 LAB — POCT GLYCOSYLATED HEMOGLOBIN (HGB A1C): Hemoglobin A1C: 7.1

## 2015-07-05 LAB — POCT SEDIMENTATION RATE: POCT SED RATE: 52 mm/hr — AB (ref 0–22)

## 2015-07-05 LAB — URIC ACID: Uric Acid, Serum: 8.5 mg/dL — ABNORMAL HIGH (ref 2.4–7.0)

## 2015-07-05 MED ORDER — CEPHALEXIN 500 MG PO CAPS: 500.0000 mg | ORAL_CAPSULE | Freq: Four times a day (QID) | ORAL | Status: DC

## 2015-07-05 MED ORDER — PREDNISONE 20 MG PO TABS: ORAL_TABLET | ORAL | Status: DC

## 2015-07-05 MED ORDER — OXYCODONE-ACETAMINOPHEN 5-325 MG PO TABS: 1.0000 | ORAL_TABLET | ORAL | Status: DC | PRN

## 2015-07-05 MED ORDER — COLCHICINE 0.6 MG PO TABS: ORAL_TABLET | ORAL | Status: DC

## 2015-07-05 NOTE — Progress Notes (Addendum)
Subjective:    Patient ID: Melissa Chavez, female    DOB: [birth year], 55 y.o.   MRN: [id number] By signing my name below, I, Judithe Modest, attest that this documentation has been prepared under the direction and in the presence of Delman Cheadle, MD. Electronically Signed: Judithe Modest, ER Scribe. 07/05/2015. 9:48 AM.  Chief Complaint  Patient presents with  . Joint Swelling    1 weeks    HPI HPI Comments: Melissa Chavez is a 55 y.o. female who presents to [facility] complaining of severe left elbow pain since 3am this morning. The pain woke her up out of sleep. She has not eaten anything different lately, no increased meat, cheese or alcohol consumption. At her visit with three days ago Dr. Latanya Maudlin and was told she had arthritis after getting an elbow xray and he could give her a cortisone injection in her elbow but she declined at that time (though did get one into the right knee).   Pain is so severe she cannot move her left arm at all. She has swelling from her hand up to her shoulder.  Her neck and proximal shoulder feel fine but severe pain with any movement of shoulder, wrist, fingers and left elbow is incredibly hyperesthetic.  It took her 3 hrs to get dressed this morning and that was after she had taken a hydrocodone. No injury.   She has been seen multiple times in the past few months, for right knee OA, and was seen 5 days ago for left elbow pain. She was taking indomethacin for her knee arthritis but developed GI discomfort and so was switched to meloxicam but it elevated blood pressure. Earlier this week she was changed to diclofenac gel and was instructed to follow up with her orthopedist. She has failed tylenol, ibuprofen, tizanidine, hydrocodone, and OTC pain patch. She has been using a cane to ambulate. She had right knee and foot x-ray in January which showed tele/patellofemoral arthritis, bone spurs, heterotopic calcification, severe articular articular space loss in the  patellofemoral joint. Pt had an appointment with Dr. Latanya Maudlin her orthopedist three days ago at which time she had a cortisone injection of her right knee.  Past Medical History  Diagnosis Date  . Hypertension   . Hernia   . Asthma   . Allergy   . Glaucoma   . Anemia   . Diabetes mellitus without complication ([place])   . GERD (gastroesophageal reflux disease)    Allergies  Allergen Reactions  . Fish Allergy Anaphylaxis  . Banana Other (See Comments)    unknown   Current Outpatient Prescriptions on File Prior to Visit  Medication Sig Dispense Refill  . ADVAIR DISKUS 250-50 MCG/DOSE AEPB Inhale 1 puff by mouth into the lungs two times daily 180 each 0  . albuterol (VENTOLIN HFA) 108 (90 Base) MCG/ACT inhaler INHALE 2 PUFFS INTO THE LUNGS EVERY 6 (SIX) HOURS AS NEEDED FOR WHEEZING. 54 each 0  . blood glucose meter kit and supplies KIT Use as directed 4 times a day.  DX: R73.09 1 each 0  . cetirizine (ZYRTEC) 10 MG tablet Take 1 tablet (10 mg total) by mouth at bedtime. 90 tablet 3  . diclofenac sodium (VOLTAREN) 1 % GEL Apply 4 g topically 2 (two) times daily. 100 g 1  . glucose blood test strip Use as directed 4 times a day.  DX: R73.09 100 each 2  . HYDROcodone-acetaminophen (NORCO) 5-325 MG tablet Take 1 tablet by mouth  every 6 (six) hours as needed. 30 tablet 0  . Lancets MISC Use as directed 4 times a day.  DX: R73.09 100 each 2  . lisinopril (PRINIVIL,ZESTRIL) 40 MG tablet Take 1 tablet by mouth  daily 90 tablet 0  . loratadine (CLARITIN) 10 MG tablet Take 10 mg by mouth daily.    . metFORMIN (GLUCOPHAGE) 1000 MG tablet Take 1 tablet (1,000 mg total) by mouth 2 (two) times daily with a meal. 180 tablet 2  . omeprazole (PRILOSEC) 20 MG capsule Take 1 capsule (20 mg total) by mouth daily. 30 capsule 1  . oxybutynin (DITROPAN XL) 15 MG 24 hr tablet Take 1 tablet by mouth at  bedtime 90 tablet 0  . pravastatin (PRAVACHOL) 40 MG tablet Take 1 tablet by mouth  daily 90 tablet 0  .  tiZANidine (ZANAFLEX) 4 MG tablet TAKE 1 TABLET (4 MG TOTAL) BY MOUTH AT BEDTIME. 90 tablet 0  . triamterene-hydrochlorothiazide (MAXZIDE-25) 37.5-25 MG tablet Take 1 tablet by mouth daily. 90 tablet 0  . VITAMIN D, CHOLECALCIFEROL, PO Take by mouth once a week.     No current facility-administered medications on file prior to visit.    Review of Systems  Constitutional: Positive for activity change and fatigue. Negative for fever, chills, appetite change and unexpected weight change.  Respiratory: Negative for chest tightness.   Cardiovascular: Negative for chest pain.  Gastrointestinal: Positive for abdominal pain. Negative for nausea, vomiting and diarrhea.  Musculoskeletal: Positive for myalgias, joint swelling, arthralgias and gait problem. Negative for back pain, neck pain and neck stiffness.  Skin: Negative for color change and wound.       Warmth  Allergic/Immunologic: Positive for immunocompromised state.  Neurological: Positive for weakness (Secondary to pain). Negative for numbness.  Hematological: Negative for adenopathy.  Psychiatric/Behavioral: Positive for sleep disturbance.      Objective:  BP 146/94 mmHg  Pulse 76  Temp(Src) 97.6 F (36.4 C)  Resp 16  Ht 5' 2"  (1.575 m)  Wt 233 lb (105.688 kg)  BMI 42.61 kg/m2  SpO2 98%  Physical Exam  Constitutional: She is oriented to person, place, and time. She appears well-developed and well-nourished. No distress.  HENT:  Head: Normocephalic and atraumatic.  Eyes: Pupils are equal, round, and reactive to light.  Neck: Neck supple.  Cardiovascular: Normal rate.   Pulmonary/Chest: Effort normal. No respiratory distress.  Musculoskeletal: Normal range of motion.  On left arm, swelling extending all the way to the left MCPs notable over the dorsum or wrist. Some warmth over the medial aspect of the left elbow. No erythema. Left elbow is 12 inches, right is 11.5 inches. Left wrist is 6.5 inches, right wrist is 7. Negative  spurlings. Moderate ROM in fingers, but no ROM in elbow or shoulder.   Neurological: She is alert and oriented to person, place, and time. Coordination normal.  Skin: Skin is warm and dry. She is not diaphoretic.  Psychiatric: She has a normal mood and affect. Her behavior is normal.  Nursing note and vitals reviewed.     Assessment & Plan:  I strongly suspect pt is having a gout flair in her left elbow.  Reports h/o gout though I don't see this in her chart.  Leukocytosis presumably due to her right knee cortisone injection she had four days prior but will cover with Keflex for cellulitis just in case as pt is immunosuppressed with DM and cortisone inj was intra-articular. Would like pt to use colcrys to treat this  but it appears that this might be to expensive or needs a PA (noted as non-formulary) so if she is unable to afford colcrys, will have her do a short oral prednisone burst instead (60, 40, 20) followed by indomethacin bid until gout flair resolves. She has been on maxide since 2014, but may need to consider changing if she continues to have gout flares.  Push water, reviewed diet. Hydrocodone really not adequately covering her pain so d/c and switch to oxycodone. Ok to try zanaflex but unlikely to help much beyond the sedation. Ok to cont topical diclofenac prn. Pt will call her orthopedists office tomorrow to see if she can get in for an intraarticular cortisone inj to her left elbow.  Pt will keep on eye on her cbgs and her BP and can stop prednisone and indomethacin if needed.    1. Elbow joint pain, left   2. Left upper extremity swelling   3. Type 2 diabetes mellitus with other diabetic arthropathy, without long-term current use of insulin ([place])   4. Type 2 diabetes mellitus with diabetic polyneuropathy, without long-term current use of insulin ([place])   5. Essential hypertension   6. ANEMIA, IRON DEFICIENCY   7. Acute gout of left elbow, unspecified cause     Orders Placed  This Encounter  Procedures  . Uric acid  . POCT glucose (manual entry)  . POCT CBC  . POCT SEDIMENTATION RATE  . POCT glycosylated hemoglobin (Hb A1C)    Meds ordered this encounter  Medications  . colchicine 0.6 MG tablet    Sig: Take 2 tabs po x 1 now and 1 tab 1 hours later    Dispense:  30 tablet    Refill:  0  . predniSONE (DELTASONE) 20 MG tablet    Sig: Take 3 tabs po x 1 now, 2 tabs po x 1 tomorrow, and 1 tab po d3    Dispense:  10 tablet    Refill:  0  . oxyCODONE-acetaminophen (ROXICET) 5-325 MG tablet    Sig: Take 1-2 tablets by mouth every 4 (four) hours as needed for severe pain.    Dispense:  60 tablet    Refill:  0  . cephALEXin (KEFLEX) 500 MG capsule    Sig: Take 1 capsule (500 mg total) by mouth 4 (four) times daily.    Dispense:  28 capsule    Refill:  0    I personally performed the services described in this documentation, which was scribed in my presence. The recorded information has been reviewed and considered, and addended by me as needed.  Delman Cheadle, MD MPH

## 2015-07-05 NOTE — Patient Instructions (Addendum)
Try drinking tons of water to flush the gout out of your body.  Fine to continue with the topical diclofenac if it is helping - I don't think there is anyway this could be contributing.  Take the colcrys immediately if you can get it.  If you can't, go ahead and fill the colcrys later to keep at home to use in case this ever happens again as the sooner you take it the more effective it is.  Your white blood cells are likely high due to the knee cortisone injection but as your arm is warm and tender and your immune system is decreased from diabetes, we do need to put you on an antibiotic to cover for possible skin and joint infections so start the keflex with breakfast, lunch, dinner, and before bed.  If you can't get the colcrys today, then use the prednisone instead.  We will try to keep the prednisone burst very short since it will shoot up your sugars.  Your last dose of the prednisone will be Tuesday so on Wednesday restart the indomethacin twice a day with breakfast and dinner if you are still having pain.  Continue this until you can see Dr. Yisroel Ramming or one of his partners this week for a prednisone injection.  Call Dr. Sara Chu office tomorrow morning to get an appointment for this. Keep an eye on your blood sugars and blood pressure - they are going to go up but having the increase some as long as we are successfully treating your pain is worth it. However, if your blood sugars are >300 or your BP >160, then stop the prednisone and indomethacine respectively and come back into clinic for repeat eval.  There are some easy diet adjustments that you can make to lower your blood uric acid level and thereby hopefully never have to suffer from another gout flair (or at least less frequently).  You should avoid alcohol, drink plenty of water, and try to follow a "low purine" diet as your body produces uric acid when it breaks down prurines-substances that are found naturally in your body, as well as in certain  foods such as organ meats, anchioves, herring, asparagus, and mushrooms. Also, increasing your diet in certain foods that may lower uric acid levels is a pretty safe way to decrease your likelihood of gout so consider drinking coffee (regular and/or decaf), eating fruits with Vitamin C in them such as citrus fruits, strawberries, broccoli,  brussel sprouts, papaya, and cantaloupe (though megadoses of vitamin C supplements may do the opposite and increase your body's uric acid levels), and/or eating more cherries and other dark-colored fruits, such as blackberries, blueberries, purple grapes and raspberries. In addition, getting plenty of vitamin A though yellow fruits, or dark green/yellow vegetables at least every other day is good.  Other general diet guidelines for people with gout who need to lower their blood uric acid levels are as follows:  Drink 8 to 16 cups ( about 2 to 4 liters) of fluid each day, with at least half being water. Eat a moderate amount of protein, preferably from healthy sources, such as low-fat or fat-free dairy, tofu, eggs, and nut butters. Limit your daily intake of meat, fish, and poultry to 4 to 6 ounces. Avoid high fat meats and desserts. Decrease your intake of shellfish, beef, lamb, pork, eggs and cheese. Avoid drastic weight reduction or fasting.  If weight loss is desired lose it over a period of several months.   IF you received  an x-ray today, you will receive an invoice from [facility]. Please contact [facility] at [phone] with questions or concerns regarding your invoice.   IF you received labwork today, you will receive an invoice from [employer]. Please contact [facility] at [phone] with questions or concerns regarding your invoice.   Our billing staff will not be able to assist you with questions regarding bills from these companies.  You will be contacted with the lab results as soon as they are  available. The fastest way to get your results is to activate your My Chart account. Instructions are located on the last page of this paperwork. If you have not heard from [place] regarding the results in 2 weeks, please contact this office.     Gout Gout is an inflammatory arthritis caused by a buildup of uric acid crystals in the joints. Uric acid is a chemical that is normally present in the blood. When the level of uric acid in the blood is too high it can form crystals that deposit in your joints and tissues. This causes joint redness, soreness, and swelling (inflammation). Repeat attacks are common. Over time, uric acid crystals can form into masses (tophi) near a joint, destroying bone and causing disfigurement. Gout is treatable and often preventable. CAUSES  The disease begins with elevated levels of uric acid in the blood. Uric acid is produced by your body when it breaks down a naturally found substance called purines. Certain foods you eat, such as meats and fish, contain high amounts of purines. Causes of an elevated uric acid level include:  Being passed down from parent to child (heredity).  Diseases that cause increased uric acid production (such as obesity, psoriasis, and certain cancers).  Excessive alcohol use.  Diet, especially diets rich in meat and seafood.  Medicines, including certain cancer-fighting medicines (chemotherapy), water pills (diuretics), and aspirin.  Chronic kidney disease. The kidneys are no longer able to remove uric acid well.  Problems with metabolism. Conditions strongly associated with gout include:  Obesity.  High blood pressure.  High cholesterol.  Diabetes. Not everyone with elevated uric acid levels gets gout. It is not understood why some people get gout and others do not. Surgery, joint injury, and eating too much of certain foods are some of the factors that can lead to gout attacks. SYMPTOMS   An attack of gout comes on quickly. It  causes intense pain with redness, swelling, and warmth in a joint.  Fever can occur.  Often, only one joint is involved. Certain joints are more commonly involved:  Base of the big toe.  Knee.  Ankle.  Wrist.  Finger. Without treatment, an attack usually goes away in a few days to weeks. Between attacks, you usually will not have symptoms, which is different from many other forms of arthritis. DIAGNOSIS  Your caregiver will suspect gout based on your symptoms and exam. In some cases, tests may be recommended. The tests may include:  Blood tests.  Urine tests.  X-rays.  Joint fluid exam. This exam requires a needle to remove fluid from the joint (arthrocentesis). Using a microscope, gout is confirmed when uric acid crystals are seen in the joint fluid. TREATMENT  There are two phases to gout treatment: treating the sudden onset (acute) attack and preventing attacks (prophylaxis).  Treatment of an Acute Attack.  Medicines are used. These include anti-inflammatory medicines or steroid medicines.  An injection of steroid medicine into the affected joint is sometimes necessary.  The painful joint is rested. Movement can worsen the arthritis.  You may use warm or cold treatments on painful joints, depending which works best for you.  Treatment to Prevent Attacks.  If you suffer from frequent gout attacks, your caregiver may advise preventive medicine. These medicines are started after the acute attack subsides. These medicines either help your kidneys eliminate uric acid from your body or decrease your uric acid production. You may need to stay on these medicines for a very long time.  The early phase of treatment with preventive medicine can be associated with an increase in acute gout attacks. For this reason, during the first few months of treatment, your caregiver may also advise you to take medicines usually used for acute gout treatment. Be sure you understand your  caregiver's directions. Your caregiver may make several adjustments to your medicine dose before these medicines are effective.  Discuss dietary treatment with your caregiver or dietitian. Alcohol and drinks high in sugar and fructose and foods such as meat, poultry, and seafood can increase uric acid levels. Your caregiver or dietitian can advise you on drinks and foods that should be limited. HOME CARE INSTRUCTIONS   Do not take aspirin to relieve pain. This raises uric acid levels.  Only take over-the-counter or prescription medicines for pain, discomfort, or fever as directed by your caregiver.  Rest the joint as much as possible. When in bed, keep sheets and blankets off painful areas.  Keep the affected joint raised (elevated).  Apply warm or cold treatments to painful joints. Use of warm or cold treatments depends on which works best for you.  Use crutches if the painful joint is in your leg.  Drink enough fluids to keep your urine clear or pale yellow. This helps your body get rid of uric acid. Limit alcohol, sugary drinks, and fructose drinks.  Follow your dietary instructions. Pay careful attention to the amount of protein you eat. Your daily diet should emphasize fruits, vegetables, whole grains, and fat-free or low-fat milk products. Discuss the use of coffee, vitamin C, and cherries with your caregiver or dietitian. These may be helpful in lowering uric acid levels.  Maintain a healthy body weight. SEEK MEDICAL CARE IF:   You develop diarrhea, vomiting, or any side effects from medicines.  You do not feel better in 24 hours, or you are getting worse. SEEK IMMEDIATE MEDICAL CARE IF:   Your joint becomes suddenly more tender, and you have chills or a fever. MAKE SURE YOU:   Understand these instructions.  Will watch your condition.  Will get help right away if you are not doing well or get worse.   This information is not intended to replace advice given to you by  your health care provider. Make sure you discuss any questions you have with your health care provider.   Document Released: 02/19/2000 Document Revised: 03/14/2014 Document Reviewed: 10/05/2011 Elsevier Interactive Patient Education [employer]

## 2015-07-10 ENCOUNTER — Telehealth: Payer: Self-pay

## 2015-07-10 NOTE — Telephone Encounter (Signed)
Dr. Clelia CroftShaw,  Looks like there was some confusion with patient and her mother Melissa PunRuby Chavez. They both see you and both has called about different issues. Melissa Chavez actually called about herself and taking OTC cold medication with her pain, gout, HTN, DM. I advise patient per your note that was sent back that it depends on the medications and to speak with pharmacist about OTC. I advise patient that there are special OTC cold medication that the pharmacist can direct her on. Patient agreed and is fully aware.    Melissa Chavez

## 2015-07-15 ENCOUNTER — Other Ambulatory Visit: Payer: Self-pay | Admitting: Family Medicine

## 2015-07-18 ENCOUNTER — Other Ambulatory Visit: Payer: Self-pay | Admitting: Family Medicine

## 2015-07-23 ENCOUNTER — Ambulatory Visit: Payer: Commercial Managed Care - HMO | Admitting: Family Medicine

## 2015-07-23 ENCOUNTER — Ambulatory Visit (INDEPENDENT_AMBULATORY_CARE_PROVIDER_SITE_OTHER): Payer: Medicare Other | Admitting: Physician Assistant

## 2015-07-23 VITALS — BP 140/82 | HR 128 | Temp 98.5°F | Resp 16 | Ht 60.5 in | Wt 218.6 lb

## 2015-07-23 DIAGNOSIS — R05 Cough: Secondary | ICD-10-CM | POA: Diagnosis not present

## 2015-07-23 DIAGNOSIS — R059 Cough, unspecified: Secondary | ICD-10-CM

## 2015-07-23 MED ORDER — BENZONATATE 100 MG PO CAPS: 100.0000 mg | ORAL_CAPSULE | Freq: Three times a day (TID) | ORAL | Status: DC | PRN

## 2015-07-23 NOTE — Progress Notes (Signed)
Patient ID: Melissa Chavez, female    DOB: 01/25/1961, 55 y.o.   MRN: [id number]  PCP: Delman Cheadle, MD  Subjective:   Chief Complaint  Patient presents with  . Cough    YELLOW MUCUS x 1 1/2 weeks  . Shortness of Breath    HPI Presents for evaluation of cough x 1.5 weeks.  She reports that initially she had nasal/sinus congestion, which has resolved. Several memebers of her family have had these symptoms, each one developing the symptoms every other day or so. The sinus pressure, congestion and sore throat have resolved, but the cough persists and she's tired of coughing. Some stress incontinence is embarrassing.  No fever/chills. No GI symptoms. No unexplained myalgias/arthralgias or rash.  She expresses interest in scheduling with a new PCP with more availability by appointment than we are currently able to provide.  She is upset by her experience at her mother's visit with her own PCP this morning. They were running late for the appointment and were told they'd need to reschedule. The provider agreed to see her, but after she saw the patient whose appointment was at that time. As such, they waited longer than she expected and felt dissatisfied with the experience.  She used her rescue albuterol inhaler in the waiting room today, which helped some. She is also using decongestant containing OTC product.    Review of Systems As above.    Patient Active Problem List   Diagnosis Date Noted  . Abdominal pain, chronic, epigastric 06/30/2015  . Osteoarthritis of right foot 03/22/2015  . Type 2 diabetes mellitus with diabetic polyneuropathy, without long-term current use of insulin ([place]) 03/20/2015  . Morbid obesity ([place]) 06/13/2013  . Overactive bladder 06/13/2013  . Allergic rhinitis 06/13/2013  . Asthma, moderate persistent 06/13/2013  . BIPOLAR DISORDER UNSPECIFIED 03/18/2008  . Vaginal leukorrhea 10/05/2007  . HYPOKALEMIA 08/24/2007  . PERIPHERAL EDEMA 07/03/2007  .  CONTUSION, RIGHT FOOT 04/23/2007  . GERD 01/30/2007  . HYPERCHOLESTEROLEMIA 12/07/2006  . Essential hypertension 12/07/2006  . ASTHMA 12/07/2006  . Osteoarthritis of right knee 12/07/2006  . ANEMIA, IRON DEFICIENCY 01/06/2006    Prior to Admission medications   Medication Sig Start Date End Date Taking? Authorizing Provider  ADVAIR DISKUS 250-50 MCG/DOSE AEPB Inhale 1 puff by mouth into the lungs two times daily 07/01/15  Yes Shawnee Knapp, MD  albuterol (VENTOLIN HFA) 108 (90 Base) MCG/ACT inhaler INHALE 2 PUFFS INTO THE LUNGS EVERY 6 (SIX) HOURS AS NEEDED FOR WHEEZING. 05/20/15  Yes Shawnee Knapp, MD  cetirizine (ZYRTEC) 10 MG tablet Take 1 tablet (10 mg total) by mouth at bedtime. 05/11/15  Yes Shawnee Knapp, MD  diclofenac sodium (VOLTAREN) 1 % GEL Apply 4 g topically 2 (two) times daily. 06/30/15  Yes Darlyne Russian, MD  fluticasone (FLONASE) 50 MCG/ACT nasal spray PLACE 2 SPRAYS INTO BOTH NOSTRILS AT BEDTIME. 07/16/15  Yes Shawnee Knapp, MD  lisinopril (PRINIVIL,ZESTRIL) 40 MG tablet Take 1 tablet by mouth  daily 07/01/15  Yes Shawnee Knapp, MD  metFORMIN (GLUCOPHAGE) 1000 MG tablet Take 1 tablet (1,000 mg total) by mouth 2 (two) times daily with a meal. 05/12/15  Yes Shawnee Knapp, MD  oxybutynin (DITROPAN XL) 15 MG 24 hr tablet Take 1 tablet by mouth at  bedtime 07/01/15  Yes Shawnee Knapp, MD  oxyCODONE-acetaminophen (ROXICET) 5-325 MG tablet Take 1-2 tablets by mouth every 4 (four) hours as needed for severe pain. 07/05/15  Yes Laurey Arrow  Brigitte Pulse, MD  pravastatin (PRAVACHOL) 40 MG tablet Take 1 tablet by mouth  daily 07/01/15  Yes Shawnee Knapp, MD  Pseudoephedrine-Guaifenesin [facility] D PO) Take by mouth daily.   Yes Historical Provider, MD  tiZANidine (ZANAFLEX) 4 MG tablet TAKE 1 TABLET (4 MG TOTAL) BY MOUTH AT BEDTIME. 05/12/15  Yes Shawnee Knapp, MD  VITAMIN D, CHOLECALCIFEROL, PO Take by mouth once a week.   Yes Historical Provider, MD  blood glucose meter kit and supplies KIT Use as directed 4 times a day.  DX: R73.09 06/13/14    Shawnee Knapp, MD  glucose blood test strip Use as directed 4 times a day.  DX: R73.09 06/13/14   Shawnee Knapp, MD  Lancets MISC Use as directed 4 times a day.  DX: R73.09 06/13/14   Shawnee Knapp, MD      Allergies  Allergen Reactions  . Fish Allergy Anaphylaxis  . Banana Other (See Comments)    unknown       Objective:  Physical Exam  Constitutional: She is oriented to person, place, and time. She appears well-developed and well-nourished. No distress.  BP 140/82 mmHg  Pulse 128  Temp(Src) 98.5 F (36.9 C) (Oral)  Resp 16  Ht 5' 0.5" (1.537 m)  Wt 218 lb 9.6 oz (99.156 kg)  BMI 41.97 kg/m2  SpO2 99%   HENT:  Head: Normocephalic and atraumatic.  Right Ear: Hearing, tympanic membrane, external ear and ear canal normal.  Left Ear: Hearing, tympanic membrane, external ear and ear canal normal.  Nose: Nose normal.  Mouth/Throat: Oropharynx is clear and moist and mucous membranes are normal. No oral lesions. Normal dentition. No uvula swelling. No oropharyngeal exudate.  Eyes: Conjunctivae are normal. No scleral icterus.  Neck: Full passive range of motion without pain and phonation normal. No thyromegaly present.  Cardiovascular: Regular rhythm, normal heart sounds and intact distal pulses.  Tachycardia present.   Pulmonary/Chest: Effort normal and breath sounds normal.  Lymphadenopathy:    She has no cervical adenopathy.  Neurological: She is alert and oriented to person, place, and time.  Skin: Skin is warm and dry.  Psychiatric: She has a normal mood and affect. Her speech is normal and behavior is normal.           Assessment & Plan:   1. Cough Likely post-viral cough. She has asthma and seasonal allergies, so her symptoms will likely last longer. Anticipatory guidance. Supportive care. Resume Flonase (which she reveals she isn't actually taking), and continue other medications. - benzonatate (TESSALON) 100 MG capsule; Take 1-2 capsules (100-200 mg total) by mouth 3  (three) times daily as needed for cough.  Dispense: 40 capsule; Refill: 0  2. Tachycardia is likely due to situational frustration, albuterol and decongestant.  Fara Chute, PA-C Physician Assistant-Certified [facility]

## 2015-07-23 NOTE — Patient Instructions (Addendum)
     IF you received an x-ray today, you will receive an invoice from [facility]. Please contact [facility] at [phone] with questions or concerns regarding your invoice.   IF you received labwork today, you will receive an invoice from [facility]. Please contact [facility] at [phone] with questions or concerns regarding your invoice.   Our billing staff will not be able to assist you with questions regarding bills from these companies.  You will be contacted with the lab results as soon as they are available. The fastest way to get your results is to activate your My Chart account. Instructions are located on the last page of this paperwork. If you have not heard from us regarding the results in 2 weeks, please contact this office.    The following providers at the following offices in our health system are open to new patients   [place] Dr. Tommy Jones Dr. Stacy Burns Dr. Liz Crawford Greg Calone, FNP  [place] Cory Nafziger, AGNP Dr. Stephen Hunter  Dr. Betty Jordan   [place] Dr. Stacey Blyth  Dr. Yvonne Lowne Dr. Jose Paz Melissa O'Sullivan, FNP Cody Martin, PA-C  Edward Saguier, PA-C  Dr. Nick Wendling (starts in July 2017)  [place] Dr. Kate Tabori  [place] Dr. Phil McGowen  Dr. Renee Kuneff   [place] Dr. Javier Gutierrez  Kate Clark, AGNP Regina Baity, FNP   [place] Dr. Jayce Cook  Dr. Eric Sonnenberg  Carrie Doss, AGNP   

## 2015-07-24 ENCOUNTER — Telehealth: Payer: Self-pay | Admitting: Radiology

## 2015-07-24 ENCOUNTER — Emergency Department (HOSPITAL_BASED_OUTPATIENT_CLINIC_OR_DEPARTMENT_OTHER)
Admission: EM | Admit: 2015-07-24 | Discharge: 2015-07-24 | Disposition: A | Discharge status: Home / Self Care | Payer: Medicare Other | Attending: Emergency Medicine | Admitting: Emergency Medicine

## 2015-07-24 ENCOUNTER — Encounter (HOSPITAL_BASED_OUTPATIENT_CLINIC_OR_DEPARTMENT_OTHER): Payer: Self-pay | Admitting: *Deleted

## 2015-07-24 ENCOUNTER — Other Ambulatory Visit: Payer: Self-pay | Admitting: Family Medicine

## 2015-07-24 DIAGNOSIS — E119 Type 2 diabetes mellitus without complications: Secondary | ICD-10-CM | POA: Diagnosis not present

## 2015-07-24 DIAGNOSIS — M25561 Pain in right knee: Secondary | ICD-10-CM

## 2015-07-24 DIAGNOSIS — I10 Essential (primary) hypertension: Secondary | ICD-10-CM | POA: Diagnosis not present

## 2015-07-24 DIAGNOSIS — Z794 Long term (current) use of insulin: Secondary | ICD-10-CM | POA: Diagnosis not present

## 2015-07-24 DIAGNOSIS — Z79899 Other long term (current) drug therapy: Secondary | ICD-10-CM | POA: Insufficient documentation

## 2015-07-24 DIAGNOSIS — J45909 Unspecified asthma, uncomplicated: Secondary | ICD-10-CM | POA: Insufficient documentation

## 2015-07-24 DIAGNOSIS — M109 Gout, unspecified: Secondary | ICD-10-CM | POA: Insufficient documentation

## 2015-07-24 MED ORDER — PREDNISONE 10 MG PO TABS: 40.0000 mg | ORAL_TABLET | Freq: Every day | ORAL | Status: DC

## 2015-07-24 MED ORDER — HYDROCODONE-ACETAMINOPHEN 5-325 MG PO TABS: 1.0000 | ORAL_TABLET | Freq: Four times a day (QID) | ORAL | Status: DC | PRN

## 2015-07-24 MED ORDER — PREDNISONE 50 MG PO TABS
ORAL_TABLET | ORAL | Status: AC
  Filled 2015-07-24: qty 1

## 2015-07-24 MED ORDER — PREDNISONE 50 MG PO TABS
60.0000 mg | ORAL_TABLET | Freq: Once | ORAL | Status: AC
  Administered 2015-07-24: 60 mg via ORAL
  Filled 2015-07-24: qty 1

## 2015-07-24 MED FILL — predniSONE 10 MG TABS: 10 | 5 days supply | Qty: 20 | Fill #0

## 2015-07-24 MED FILL — HYDROCODON-APAP 5-325: 5-325 | 2 days supply | Qty: 10 | Fill #0

## 2015-07-24 NOTE — ED Provider Notes (Signed)
CSN: [id number]     Arrival date & time 07/24/15  1556 History   First MD Initiated Contact with Patient 07/24/15 1639     Chief Complaint  Patient presents with  . Leg Pain     (Consider location/radiation/quality/duration/timing/severity/associated sxs/prior Treatment) Patient is a 55 y.o. female presenting with leg pain. The history is provided by the patient.  Leg Pain Associated symptoms: no back pain, no fever and no neck pain   Patient presents with recurrence of left elbow pain and right knee pain. Patient seen April 30 by her primary care doctor and treated as if it could be an acute gouty arthritis patient has a history of that. Also in the meantime patient was seen by orthopedics and they did a steroid injection in the right knee but not the left elbow. Also according to primary care notes patient was to be treated with colchine seen as well as steroids. But not sure if patient took either. Patient states she was also treated with Percocet and has run out of that and wants to have that refilled.  Past Medical History  Diagnosis Date  . Hypertension   . Hernia   . Asthma   . Allergy   . Glaucoma   . Anemia   . Diabetes mellitus without complication ([place])   . GERD (gastroesophageal reflux disease)    Past Surgical History  Procedure Laterality Date  . Hernia repair    . Tubal ligation    . Tonsillectomy     Family History  Problem Relation Age of Onset  . Hypertension Mother   . Diabetes Mother   . Hyperlipidemia Mother   . Stroke Mother   . Heart disease Mother   . Hypertension Father   . Diabetes Father   . Hyperlipidemia Father    Social History  Substance Use Topics  . Smoking status: Never Smoker   . Smokeless tobacco: Never Used  . Alcohol Use: No   OB History    No data available     Review of Systems  Constitutional: Negative for fever.  HENT: Negative for congestion.   Eyes: Negative for redness.  Respiratory: Negative for shortness of  breath.   Cardiovascular: Negative for chest pain.  Gastrointestinal: Negative for abdominal pain.  Genitourinary: Negative for dysuria.  Musculoskeletal: Positive for joint swelling and arthralgias. Negative for back pain and neck pain.  Skin: Negative for rash.  Neurological: Negative for headaches.  Psychiatric/Behavioral: Negative for confusion.      Allergies  Fish allergy and Banana  Home Medications   Prior to Admission medications   Medication Sig Start Date End Date Taking? Authorizing Provider  ADVAIR DISKUS 250-50 MCG/DOSE AEPB Inhale 1 puff by mouth into the lungs two times daily 07/01/15   Shawnee Knapp, MD  albuterol (VENTOLIN HFA) 108 (90 Base) MCG/ACT inhaler INHALE 2 PUFFS INTO THE LUNGS EVERY 6 (SIX) HOURS AS NEEDED FOR WHEEZING. 05/20/15   Shawnee Knapp, MD  benzonatate (TESSALON) 100 MG capsule Take 1-2 capsules (100-200 mg total) by mouth 3 (three) times daily as needed for cough. 07/23/15   Chelle Jeffery, PA-C  blood glucose meter kit and supplies KIT Use as directed 4 times a day.  DX: R73.09 06/13/14   Shawnee Knapp, MD  cetirizine (ZYRTEC) 10 MG tablet Take 1 tablet (10 mg total) by mouth at bedtime. 05/11/15   Shawnee Knapp, MD  diclofenac sodium (VOLTAREN) 1 % GEL Apply 4 g topically 2 (two) times daily.  06/30/15   Darlyne Russian, MD  fluticasone (FLONASE) 50 MCG/ACT nasal spray PLACE 2 SPRAYS INTO BOTH NOSTRILS AT BEDTIME. 07/16/15   Shawnee Knapp, MD  glucose blood test strip Use as directed 4 times a day.  DX: R73.09 06/13/14   Shawnee Knapp, MD  HYDROcodone-acetaminophen (NORCO/VICODIN) 5-325 MG tablet Take 1-2 tablets by mouth every 6 (six) hours as needed for moderate pain. 07/24/15   Fredia Sorrow, MD  Lancets MISC Use as directed 4 times a day.  DX: R73.09 06/13/14   Shawnee Knapp, MD  lisinopril (PRINIVIL,ZESTRIL) 40 MG tablet Take 1 tablet by mouth  daily 07/01/15   Shawnee Knapp, MD  metFORMIN (GLUCOPHAGE) 1000 MG tablet Take 1 tablet (1,000 mg total) by mouth 2 (two) times daily with  a meal. 05/12/15   Shawnee Knapp, MD  oxybutynin (DITROPAN XL) 15 MG 24 hr tablet Take 1 tablet by mouth at  bedtime 07/01/15   Shawnee Knapp, MD  oxyCODONE-acetaminophen (ROXICET) 5-325 MG tablet Take 1-2 tablets by mouth every 4 (four) hours as needed for severe pain. 07/05/15   Shawnee Knapp, MD  pravastatin (PRAVACHOL) 40 MG tablet Take 1 tablet by mouth  daily 07/01/15   Shawnee Knapp, MD  predniSONE (DELTASONE) 10 MG tablet Take 4 tablets (40 mg total) by mouth daily. 07/24/15   Fredia Sorrow, MD  Pseudoephedrine-Guaifenesin [facility] D PO) Take by mouth daily.    Historical Provider, MD  tiZANidine (ZANAFLEX) 4 MG tablet TAKE 1 TABLET (4 MG TOTAL) BY MOUTH AT BEDTIME. 05/12/15   Shawnee Knapp, MD  VITAMIN D, CHOLECALCIFEROL, PO Take by mouth once a week.    Historical Provider, MD   BP 126/73 mmHg  Pulse 110  Temp(Src) 99.2 F (37.3 C) (Oral)  Resp 20  Ht '5\' 2"'  (1.575 m)  Wt 98.431 kg  BMI 39.68 kg/m2  SpO2 100% Physical Exam  Constitutional: She is oriented to person, place, and time. She appears well-developed and well-nourished. No distress.  HENT:  Head: Normocephalic and atraumatic.  Mouth/Throat: Oropharynx is clear and moist.  Eyes: Conjunctivae and EOM are normal. Pupils are equal, round, and reactive to light.  Neck: Normal range of motion. Neck supple.  Cardiovascular: Normal rate, regular rhythm and normal heart sounds.   Pulmonary/Chest: Effort normal and breath sounds normal. No respiratory distress.  Abdominal: Soft. Bowel sounds are normal. There is no tenderness.  Musculoskeletal: She exhibits edema and tenderness.  Left elbow without any redness or swelling good range of motion. Right knee with swelling and increased warmth but no redness. The mid range of motion due to pain.  Neurological: She is alert and oriented to person, place, and time. No cranial nerve deficit. She exhibits normal muscle tone. Coordination normal.  Skin: Skin is warm. No erythema.  Nursing note and vitals  reviewed.   ED Course  Procedures (including critical care time) Labs Review Labs Reviewed - No data to display  Imaging Review No results found. I have personally reviewed and evaluated these images and lab results as part of my medical decision-making.   EKG Interpretation None      MDM   Final diagnoses:  Knee pain, acute, right  Acute gout of right knee, unspecified cause    Patient with known history of gout. Has been struggling with some difficulties with pain in the left elbow right knee since April 30. Last time she was seen by her primary care doctor. Patient since then  has been seen by orthopedics and had steroid injection in her right knee. Patient was to be started on colchine seen but she did not get it filled. Also supposedly a prescription of prednisone but not sure if patient actually took that. We'll re-dose on prednisone and provide a short course of hydrocodone for pain. Follow back up with orthopedics and her primary care doctor. Clinically no concern for joint infection in the right knee. There is swelling and increased warmth there. No redness. There is no swelling to the left elbow and no redness. Good range of motion at the elbow. Limited range of motion at the knee. No history of any fall or acute injury.  Primary care notes reviewed from today. They wanted her reevaluated in one is just refill her Percocet prescription. Based on the findings of the right knee seems appropriate to have a short course of hydrocodone to help with some pain until the prednisone starts to work.  Patient will be treated with a knee immobilizer to provide comfort to the right knee.   Fredia Sorrow, MD 07/24/15 1725

## 2015-07-24 NOTE — Telephone Encounter (Signed)
No, this was prescribed for gout which should be resolved. Because oxycodone is so strong with so many adverse effects, we try to limit its use for acute, severe injuries only.  If she is still having problems with gout than we need to change our treatment of that to a different anti-inflammatory or use a preventative medication - suggest she be seen for that.

## 2015-07-24 NOTE — Telephone Encounter (Signed)
Pt advised.

## 2015-07-24 NOTE — Telephone Encounter (Signed)
Pt would like to know if she can have a refill on her oxycodone that was prescribed at her visit on 4/30

## 2015-07-24 NOTE — ED Notes (Signed)
Pain in her right knee and left elbow. Hx of gout and arthritis per pt.

## 2015-07-24 NOTE — ED Notes (Signed)
MD at bedside. 

## 2015-07-24 NOTE — ED Notes (Signed)
Pt requesting water, advised pt nothing until she is seen by MD.

## 2015-07-24 NOTE — Discharge Instructions (Signed)
Knee symptoms consistent with a gout arthritis or inflammatory arthritis. Use knee immobilizer take prednisone as directed. Supplement with a short course of hydrocodone. Make appointment to follow-up with orthopedics in your primary care doctor. Return for any new or worse symptoms to include development of fevers.

## 2015-07-24 NOTE — Telephone Encounter (Signed)
Pt would like to know if she can receive a refill of

## 2015-07-28 ENCOUNTER — Other Ambulatory Visit: Payer: Self-pay

## 2015-07-28 ENCOUNTER — Encounter (HOSPITAL_COMMUNITY): Payer: Self-pay | Admitting: Adult Health

## 2015-07-28 ENCOUNTER — Emergency Department (HOSPITAL_COMMUNITY)
Admission: EM | Admit: 2015-07-28 | Discharge: 2015-07-29 | Disposition: A | Discharge status: Home / Self Care | Payer: Medicare Other | Attending: Emergency Medicine | Admitting: Emergency Medicine

## 2015-07-28 ENCOUNTER — Emergency Department (HOSPITAL_COMMUNITY): Payer: Medicare Other

## 2015-07-28 DIAGNOSIS — Z791 Long term (current) use of non-steroidal anti-inflammatories (NSAID): Secondary | ICD-10-CM | POA: Diagnosis not present

## 2015-07-28 DIAGNOSIS — Z8659 Personal history of other mental and behavioral disorders: Secondary | ICD-10-CM | POA: Diagnosis not present

## 2015-07-28 DIAGNOSIS — J159 Unspecified bacterial pneumonia: Secondary | ICD-10-CM | POA: Diagnosis not present

## 2015-07-28 DIAGNOSIS — Z8669 Personal history of other diseases of the nervous system and sense organs: Secondary | ICD-10-CM | POA: Insufficient documentation

## 2015-07-28 DIAGNOSIS — J45901 Unspecified asthma with (acute) exacerbation: Secondary | ICD-10-CM | POA: Diagnosis not present

## 2015-07-28 DIAGNOSIS — Z8719 Personal history of other diseases of the digestive system: Secondary | ICD-10-CM | POA: Diagnosis not present

## 2015-07-28 DIAGNOSIS — Z7952 Long term (current) use of systemic steroids: Secondary | ICD-10-CM | POA: Insufficient documentation

## 2015-07-28 DIAGNOSIS — J189 Pneumonia, unspecified organism: Secondary | ICD-10-CM | POA: Diagnosis not present

## 2015-07-28 DIAGNOSIS — Z862 Personal history of diseases of the blood and blood-forming organs and certain disorders involving the immune mechanism: Secondary | ICD-10-CM | POA: Insufficient documentation

## 2015-07-28 DIAGNOSIS — Z79899 Other long term (current) drug therapy: Secondary | ICD-10-CM | POA: Insufficient documentation

## 2015-07-28 DIAGNOSIS — I10 Essential (primary) hypertension: Secondary | ICD-10-CM | POA: Insufficient documentation

## 2015-07-28 DIAGNOSIS — R0602 Shortness of breath: Secondary | ICD-10-CM | POA: Diagnosis not present

## 2015-07-28 DIAGNOSIS — E119 Type 2 diabetes mellitus without complications: Secondary | ICD-10-CM | POA: Diagnosis not present

## 2015-07-28 DIAGNOSIS — K130 Diseases of lips: Secondary | ICD-10-CM | POA: Insufficient documentation

## 2015-07-28 DIAGNOSIS — Z7984 Long term (current) use of oral hypoglycemic drugs: Secondary | ICD-10-CM | POA: Insufficient documentation

## 2015-07-28 DIAGNOSIS — R05 Cough: Secondary | ICD-10-CM | POA: Diagnosis not present

## 2015-07-28 HISTORY — DX: Bipolar II disorder: F31.81

## 2015-07-28 LAB — BASIC METABOLIC PANEL
Anion gap: 8 (ref 5–15)
BUN: 24 mg/dL — ABNORMAL HIGH (ref 6–20)
CO2: 24 mmol/L (ref 22–32)
Calcium: 9.4 mg/dL (ref 8.9–10.3)
Chloride: 102 mmol/L (ref 101–111)
Creatinine, Ser: 1.18 mg/dL — ABNORMAL HIGH (ref 0.44–1.00)
GFR calc Af Amer: 59 mL/min — ABNORMAL LOW (ref >60)
GFR calc non Af Amer: 51 mL/min — ABNORMAL LOW (ref >60)
Glucose, Bld: 245 mg/dL — ABNORMAL HIGH (ref 65–99)
Potassium: 4 mmol/L (ref 3.5–5.1)
Sodium: 134 mmol/L — ABNORMAL LOW (ref 135–145)

## 2015-07-28 LAB — CBC WITH DIFFERENTIAL/PLATELET
Basophils Absolute: 0 10*3/uL (ref 0.0–0.1)
Basophils Relative: 0 %
Eosinophils Absolute: 0.2 10*3/uL (ref 0.0–0.7)
Eosinophils Relative: 1 %
HCT: 28.4 % — ABNORMAL LOW (ref 36.0–46.0)
Hemoglobin: 9.6 g/dL — ABNORMAL LOW (ref 12.0–15.0)
Lymphocytes Relative: 21 %
Lymphs Abs: 4.9 10*3/uL — ABNORMAL HIGH (ref 0.7–4.0)
MCH: 26.1 pg (ref 26.0–34.0)
MCHC: 33.8 g/dL (ref 30.0–36.0)
MCV: 77.2 fL — ABNORMAL LOW (ref 78.0–100.0)
Monocytes Absolute: 1.6 10*3/uL — ABNORMAL HIGH (ref 0.1–1.0)
Monocytes Relative: 7 %
Neutro Abs: 16.4 10*3/uL — ABNORMAL HIGH (ref 1.7–7.7)
Neutrophils Relative %: 71 %
Platelets: 655 10*3/uL — ABNORMAL HIGH (ref 150–400)
RBC: 3.68 MIL/uL — ABNORMAL LOW (ref 3.87–5.11)
RDW: 15.5 % (ref 11.5–15.5)
WBC: 23.1 10*3/uL — ABNORMAL HIGH (ref 4.0–10.5)

## 2015-07-28 LAB — I-STAT TROPONIN, ED: Troponin i, poc: 0 ng/mL (ref 0.00–0.08)

## 2015-07-28 MED ORDER — FLUTICASONE PROPIONATE 50 MCG/ACT NA SUSP: NASAL | Status: DC

## 2015-07-28 NOTE — ED Notes (Signed)
Presents with multiple complaints-recently started prednisone and per daughter she has a history of bipolar disorder-she reports increase in manic behavior. Per pt she is here for SOB and exhaustion because she is not sleeping for 5 days and falls asleep randomly during the day and while driving. She also c/o pain around her mouth-fever blisters noted. She endorse stress due to her mother being in the hospital. Denies SI and HI. Breath sounds clear. o2 sats are 99% RA. Endorses productive cough with yellow phlegm.

## 2015-07-28 NOTE — ED Provider Notes (Signed)
History  By signing my name below, I, Melissa Chavez, attest that this documentation has been prepared under the direction and in the presence of Melissa Balls, MD. Electronically Signed: Bea Chavez, ED Scribe. 07/29/2015. 12:32 AM.  Chief Complaint  Patient presents with  . multiple complaints    The history is provided by the patient and medical records. No language interpreter was used.    HPI Comments:  Melissa Chavez is a 55 y.o. obese female with PMHx of asthma who presents to the Emergency Department complaining of SOB that began yesterday. She states she was recently put on prednisone for knee pain and states she feels like she is going crazy. She reports irritable behavior and difficulty sleeping and states she believes it is a reaction from the Prednisone. She has not taken anything to treat her symptoms. She denies modifying factors. She denies fever or chills.  Past Medical History  Diagnosis Date  . Hypertension   . Hernia   . Asthma   . Allergy   . Glaucoma   . Anemia   . Diabetes mellitus without complication ([place])   . GERD (gastroesophageal reflux disease)   . Bipolar 2 disorder Melissa Chavez)    Past Surgical History  Procedure Laterality Date  . Hernia repair    . Tubal ligation    . Tonsillectomy     Family History  Problem Relation Age of Onset  . Hypertension Mother   . Diabetes Mother   . Hyperlipidemia Mother   . Stroke Mother   . Heart disease Mother   . Hypertension Father   . Diabetes Father   . Hyperlipidemia Father    Social History  Substance Use Topics  . Smoking status: Never Smoker   . Smokeless tobacco: Never Used  . Alcohol Use: No   OB History    No data available     Review of Systems A complete 10 system review of systems was obtained and all systems are negative except as noted in the HPI and PMH.   Allergies  Fish allergy and Banana  Home Medications   Prior to Admission medications   Medication Sig Start Date End  Date Taking? Authorizing Provider  ADVAIR DISKUS 250-50 MCG/DOSE AEPB Inhale 1 puff by mouth into the lungs two times daily 07/01/15  Yes Shawnee Knapp, MD  benzonatate (TESSALON) 100 MG capsule Take 1-2 capsules (100-200 mg total) by mouth 3 (three) times daily as needed for cough. 07/23/15  Yes Chelle Jeffery, PA-C  cetirizine (ZYRTEC) 10 MG tablet Take 1 tablet (10 mg total) by mouth at bedtime. 05/11/15  Yes Shawnee Knapp, MD  diclofenac sodium (VOLTAREN) 1 % GEL Apply 4 g topically 2 (two) times daily. 06/30/15  Yes Darlyne Russian, MD  fluticasone (FLONASE) 50 MCG/ACT nasal spray PLACE 2 SPRAYS INTO BOTH NOSTRILS AT BEDTIME. 07/28/15  Yes Shawnee Knapp, MD  HYDROcodone-acetaminophen (NORCO/VICODIN) 5-325 MG tablet Take 1-2 tablets by mouth every 6 (six) hours as needed for moderate pain. 07/24/15  Yes Fredia Sorrow, MD  lisinopril (PRINIVIL,ZESTRIL) 40 MG tablet Take 1 tablet by mouth  daily 07/01/15  Yes Shawnee Knapp, MD  metFORMIN (GLUCOPHAGE) 1000 MG tablet Take 1 tablet (1,000 mg total) by mouth 2 (two) times daily with a meal. 05/12/15  Yes Shawnee Knapp, MD  oxybutynin (DITROPAN XL) 15 MG 24 hr tablet Take 1 tablet by mouth at  bedtime 07/01/15  Yes Shawnee Knapp, MD  pravastatin (PRAVACHOL) 40 MG tablet  Take 1 tablet by mouth  daily 07/01/15  Yes Shawnee Knapp, MD  predniSONE (DELTASONE) 10 MG tablet Take 4 tablets (40 mg total) by mouth daily. 07/24/15  Yes Fredia Sorrow, MD  tiZANidine (ZANAFLEX) 4 MG tablet TAKE 1 TABLET (4 MG TOTAL) BY MOUTH AT BEDTIME. 05/12/15  Yes Shawnee Knapp, MD  VENTOLIN HFA 108 [phone] Base) MCG/ACT inhaler Inhale 2 puffs by mouth  every 6 hours as needed for wheezing 07/25/15  Yes Shawnee Knapp, MD  blood glucose meter kit and supplies KIT Use as directed 4 times a day.  DX: R73.09 06/13/14   Shawnee Knapp, MD  glucose blood test strip Use as directed 4 times a day.  DX: R73.09 06/13/14   Shawnee Knapp, MD  Lancets MISC Use as directed 4 times a day.  DX: R73.09 06/13/14   Shawnee Knapp, MD  oxyCODONE-acetaminophen  (ROXICET) 5-325 MG tablet Take 1-2 tablets by mouth every 4 (four) hours as needed for severe pain. Patient not taking: Reported on 07/29/2015 07/05/15   Shawnee Knapp, MD   Triage Vitals: BP 131/85 mmHg  Pulse 77  Temp(Src) 97.8 F (36.6 C) (Oral)  Resp 20  SpO2 99% Physical Exam  Constitutional: She is oriented to person, place, and time. She appears well-developed and well-nourished. No distress.  HENT:  Head: Normocephalic and atraumatic.  Nose: Nose normal.  Mouth/Throat: Oropharynx is clear and moist. No oropharyngeal exudate.  Vesicles to upper lip  Eyes: Conjunctivae and EOM are normal. Pupils are equal, round, and reactive to light. No scleral icterus.  Neck: Normal range of motion. Neck supple. No JVD present. No tracheal deviation present. No thyromegaly present.  Cardiovascular: Normal rate, regular rhythm and normal heart sounds.  Exam reveals no gallop and no friction rub.   No murmur heard. Pulmonary/Chest: Effort normal. No respiratory distress. She has wheezes. She exhibits no tenderness.  Intermittent wheezes to bilateral bases  Abdominal: Soft. Bowel sounds are normal. She exhibits no distension and no mass. There is no tenderness. There is no rebound and no guarding.  Musculoskeletal: Normal range of motion. She exhibits no edema or tenderness.  Lymphadenopathy:    She has no cervical adenopathy.  Neurological: She is alert and oriented to person, place, and time. No cranial nerve deficit. She exhibits normal muscle tone.  Skin: Skin is warm and dry. No rash noted. No erythema. No pallor.  Nursing note and vitals reviewed.   ED Course  Procedures (including critical care time) DIAGNOSTIC STUDIES: Oxygen Saturation is 99% on RA, normal by my interpretation.   COORDINATION OF CARE: 12:28 AM- Will order breathing treatment and antibiotics. Pt verbalizes understanding and agrees to plan.  Medications  azithromycin (ZITHROMAX) tablet 500 mg (not administered)   ipratropium-albuterol (DUONEB) 0.5-2.5 (3) MG/3ML nebulizer solution 3 mL (not administered)    Labs Review Labs Reviewed  CBC WITH DIFFERENTIAL/PLATELET - Abnormal; Notable for the following:    WBC 23.1 (*)    RBC 3.68 (*)    Hemoglobin 9.6 (*)    HCT 28.4 (*)    MCV 77.2 (*)    Platelets 655 (*)    Neutro Abs 16.4 (*)    Lymphs Abs 4.9 (*)    Monocytes Absolute 1.6 (*)    All other components within normal limits  BASIC METABOLIC PANEL - Abnormal; Notable for the following:    Sodium 134 (*)    Glucose, Bld 245 (*)    BUN 24 (*)  Creatinine, Ser 1.18 (*)    GFR calc non Af Amer 51 (*)    GFR calc Af Amer 59 (*)    All other components within normal limits  I-STAT TROPOININ, ED    Imaging Review Dg Chest 2 View  07/28/2015  CLINICAL DATA:  Shortness of breath and productive cough intermittently 1 week. EXAM: CHEST  2 VIEW COMPARISON:  10/19/2007 FINDINGS: Lungs are adequately inflated with subtle patchy density in the medial right base which may be due to early infection versus vascular crowding. Cardiomediastinal silhouette and remainder of the exam is unchanged. IMPRESSION: Mild patchy opacification in the medial right base which may be due to early infection versus vascular crowding. Electronically Signed   By: Marin Olp M.D.   On: 07/28/2015 21:52   I have personally reviewed and evaluated these images and lab results as part of my medical decision-making.   EKG Interpretation   Date/Time:  Tuesday Jul 28 2015 21:08:52 EDT Ventricular Rate:  88 PR Interval:  150 QRS Duration: 66 QT Interval:  346 QTC Calculation: 418 R Axis:   61 Text Interpretation:  Normal sinus rhythm Normal ECG No significant change  since last tracing Confirmed by Glynn Octave [phone]) on 07/28/2015  11:55:55 PM      MDM   Final diagnoses:  None   Patient presents to the ED for SOB.  She has had coughing as well.  CXR reveals pneumonia.  Clinically, patient appears well  and has no hypoxia.  She will be safe for outpt. Treatment.  Will give duoneb for intermittent wheezing.    Patient advised to stop prednisone due to her reaction.  She is not sleeping well and is having racing thoughts with her bipolar disorder.  PCP fu advised within 3 days.  She appears well and in NAD.  VS remain within her normal limits and she is safe for DC.    I personally performed the services described in this documentation, which was scribed in my presence. The recorded information has been reviewed and is accurate.       Melissa Balls, MD 07/29/15 [phone]

## 2015-07-29 DIAGNOSIS — M25561 Pain in right knee: Secondary | ICD-10-CM | POA: Diagnosis not present

## 2015-07-29 DIAGNOSIS — M7712 Lateral epicondylitis, left elbow: Secondary | ICD-10-CM | POA: Diagnosis not present

## 2015-07-29 MED ORDER — IPRATROPIUM-ALBUTEROL 0.5-2.5 (3) MG/3ML IN SOLN
3.0000 mL | RESPIRATORY_TRACT | Status: DC
  Administered 2015-07-29: 3 mL via RESPIRATORY_TRACT
  Filled 2015-07-29: qty 3

## 2015-07-29 MED ORDER — AZITHROMYCIN 250 MG PO TABS
500.0000 mg | ORAL_TABLET | Freq: Once | ORAL | Status: AC
  Administered 2015-07-29: 500 mg via ORAL
  Filled 2015-07-29: qty 2

## 2015-07-29 MED ORDER — AZITHROMYCIN 250 MG PO TABS: 250.0000 mg | ORAL_TABLET | Freq: Every day | ORAL | Status: DC

## 2015-07-29 MED ORDER — IPRATROPIUM-ALBUTEROL 0.5-2.5 (3) MG/3ML IN SOLN: 3.0000 mL | Freq: Two times a day (BID) | RESPIRATORY_TRACT | Status: DC

## 2015-07-29 NOTE — ED Notes (Signed)
Patient left at this time with all belongings. 

## 2015-07-29 NOTE — Discharge Instructions (Signed)
Community-Acquired Pneumonia, Adult Ms. Melissa Chavez, take antibiotics for treatment and see your primary care doctor within 3 days for close follow up.  Stop taking prednisone due to its adverse reactions. If any symptoms worsen, come back to the ED immediately. Thank you. Pneumonia is an infection of the lungs. One type of pneumonia can happen while a person is in a hospital. A different type can happen when a person is not in a hospital (community-acquired pneumonia). It is easy for this kind to spread from person to person. It can spread to you if you breathe near an infected person who coughs or sneezes. Some symptoms include:  A dry cough.  A wet (productive) cough.  Fever.  Sweating.  Chest pain. HOME CARE  Take over-the-counter and prescription medicines only as told by your doctor.  Only take cough medicine if you are losing sleep.  If you were prescribed an antibiotic medicine, take it as told by your doctor. Do not stop taking the antibiotic even if you start to feel better.  Sleep with your head and neck raised (elevated). You can do this by putting a few pillows under your head, or you can sleep in a recliner.  Do not use tobacco products. These include cigarettes, chewing tobacco, and e-cigarettes. If you need help quitting, ask your doctor.  Drink enough water to keep your pee (urine) clear or pale yellow. A shot (vaccine) can help prevent pneumonia. Shots are often suggested for:  People older than 56 years of age.  People older than 56 years of age:  Who are having cancer treatment.  Who have long-term (chronic) lung disease.  Who have problems with their body's defense system (immune system). You may also prevent pneumonia if you take these actions:  Get the flu (influenza) shot every year.  Go to the dentist as often as told.  Wash your hands often. If soap and water are not available, use hand sanitizer. GET HELP IF:  You have a fever.  You lose sleep  because your cough medicine does not help. GET HELP RIGHT AWAY IF:  You are short of breath and it gets worse.  You have more chest pain.  Your sickness gets worse. This is very serious if:  You are an older adult.  Your body's defense system is weak.  You cough up blood.   This information is not intended to replace advice given to you by your health care provider. Make sure you discuss any questions you have with your health care provider.   Document Released: 08/10/2007 Document Revised: 11/12/2014 Document Reviewed: 06/18/2014 Elsevier Interactive Patient Education [employer]2016 Elsevier Inc.

## 2015-08-04 ENCOUNTER — Encounter (HOSPITAL_COMMUNITY): Payer: Self-pay | Admitting: Emergency Medicine

## 2015-08-04 ENCOUNTER — Emergency Department (HOSPITAL_COMMUNITY): Payer: Medicare Other

## 2015-08-04 ENCOUNTER — Emergency Department (HOSPITAL_COMMUNITY)
Admission: EM | Admit: 2015-08-04 | Discharge: 2015-08-05 | Disposition: A | Discharge status: Home / Self Care | Payer: Medicare Other | Attending: Emergency Medicine | Admitting: Emergency Medicine

## 2015-08-04 DIAGNOSIS — I10 Essential (primary) hypertension: Secondary | ICD-10-CM | POA: Diagnosis not present

## 2015-08-04 DIAGNOSIS — Z7984 Long term (current) use of oral hypoglycemic drugs: Secondary | ICD-10-CM | POA: Diagnosis not present

## 2015-08-04 DIAGNOSIS — J45909 Unspecified asthma, uncomplicated: Secondary | ICD-10-CM | POA: Insufficient documentation

## 2015-08-04 DIAGNOSIS — Z79899 Other long term (current) drug therapy: Secondary | ICD-10-CM | POA: Diagnosis not present

## 2015-08-04 DIAGNOSIS — R0602 Shortness of breath: Secondary | ICD-10-CM | POA: Diagnosis not present

## 2015-08-04 DIAGNOSIS — R079 Chest pain, unspecified: Secondary | ICD-10-CM | POA: Diagnosis not present

## 2015-08-04 DIAGNOSIS — J45901 Unspecified asthma with (acute) exacerbation: Secondary | ICD-10-CM

## 2015-08-04 DIAGNOSIS — E119 Type 2 diabetes mellitus without complications: Secondary | ICD-10-CM | POA: Insufficient documentation

## 2015-08-04 DIAGNOSIS — R05 Cough: Secondary | ICD-10-CM | POA: Diagnosis not present

## 2015-08-04 DIAGNOSIS — F3181 Bipolar II disorder: Secondary | ICD-10-CM | POA: Insufficient documentation

## 2015-08-04 NOTE — ED Notes (Signed)
Pt states she has shortness of breath  Pt [facility] and was diagnosed with pneumonia and was given a zpack but she does not feel like she is doing any better  Pt states she has been using her inhaler but is not helping as much as it usually does

## 2015-08-05 ENCOUNTER — Encounter (HOSPITAL_COMMUNITY): Payer: Self-pay | Admitting: Emergency Medicine

## 2015-08-05 MED ORDER — IPRATROPIUM-ALBUTEROL 0.5-2.5 (3) MG/3ML IN SOLN
3.0000 mL | Freq: Once | RESPIRATORY_TRACT | Status: AC
  Administered 2015-08-05: 3 mL via RESPIRATORY_TRACT

## 2015-08-05 MED ORDER — IPRATROPIUM BROMIDE HFA 17 MCG/ACT IN AERS: 2.0000 | INHALATION_SPRAY | RESPIRATORY_TRACT | Status: DC | PRN

## 2015-08-05 NOTE — Discharge Instructions (Signed)

## 2015-08-05 NOTE — ED Provider Notes (Signed)
CSN: [id number]     Arrival date & time 08/04/15  2225 History  By signing my name below, I, Tobe Sos, attest that this documentation has been prepared under the direction and in the presence of Quay Simkin, MD.  Electronically Signed: Tobe Sos, ED Scribe. 08/05/2015. 3:22 AM.    Chief Complaint  Patient presents with  . Shortness of Breath   Patient is a 55 y.o. female presenting with wheezing. The history is provided by the patient. No language interpreter was used.  Wheezing Severity:  Moderate Severity compared to prior episodes:  Similar Onset quality:  Gradual Progression:  Resolved Chronicity:  Recurrent Relieved by:  Nebulizer treatments Worsened by:  Allergens Ineffective treatments:  None tried Associated symptoms: chest tightness and shortness of breath   Associated symptoms: no rhinorrhea   Risk factors: prior hospitalizations    HPI Comments: ONYX EDGLEY is a 55 y.o. female with a PMHx of HTN, Asthma, DM, allergies, and GERD, who presents to the Emergency Department complaining of gradual onset, gradually worsening shortness of breath that began earlier this morning. Pt reported wheezing before her breathing treatment. Pt denies wheezing after her breathing treatment. Pt was in seen previously in [facility] ED on 07/29/15 and was dx w/ PNA. She was given a course antibiotics and it has since resolved. She is not out of her prescribed inhalers. Pt takes Prednisone for her knee pain. No other alleviating factors PTA noted.   Past Medical History  Diagnosis Date  . Hypertension   . Hernia   . Asthma   . Allergy   . Glaucoma   . Anemia   . Diabetes mellitus without complication ([place])   . GERD (gastroesophageal reflux disease)   . Bipolar 2 disorder [facility])    Past Surgical History  Procedure Laterality Date  . Hernia repair    . Tubal ligation    . Tonsillectomy     Family History  Problem Relation Age of Onset  . Hypertension Mother   . Diabetes  Mother   . Hyperlipidemia Mother   . Stroke Mother   . Heart disease Mother   . Hypertension Father   . Diabetes Father   . Hyperlipidemia Father    Social History  Substance Use Topics  . Smoking status: Never Smoker   . Smokeless tobacco: Never Used  . Alcohol Use: No   OB History    No data available     Review of Systems  HENT: Negative for rhinorrhea.   Respiratory: Positive for chest tightness, shortness of breath and wheezing.   All other systems reviewed and are negative.  Allergies  Fish allergy and Banana  Home Medications   Prior to Admission medications   Medication Sig Start Date End Date Taking? Authorizing Provider  ADVAIR DISKUS 250-50 MCG/DOSE AEPB Inhale 1 puff by mouth into the lungs two times daily 07/01/15   Shawnee Knapp, MD  azithromycin (ZITHROMAX) 250 MG tablet Take 1 tablet (250 mg total) by mouth daily. 07/29/15   [name], MD  benzonatate (TESSALON) 100 MG capsule Take 1-2 capsules (100-200 mg total) by mouth 3 (three) times daily as needed for cough. 07/23/15   Chelle Jeffery, PA-C  blood glucose meter kit and supplies KIT Use as directed 4 times a day.  DX: R73.09 06/13/14   Shawnee Knapp, MD  cetirizine (ZYRTEC) 10 MG tablet Take 1 tablet (10 mg total) by mouth at bedtime. 05/11/15   Shawnee Knapp, MD  diclofenac  sodium (VOLTAREN) 1 % GEL Apply 4 g topically 2 (two) times daily. 06/30/15   Darlyne Russian, MD  fluticasone (FLONASE) 50 MCG/ACT nasal spray PLACE 2 SPRAYS INTO BOTH NOSTRILS AT BEDTIME. 07/28/15   Shawnee Knapp, MD  glucose blood test strip Use as directed 4 times a day.  DX: R73.09 06/13/14   Shawnee Knapp, MD  HYDROcodone-acetaminophen (NORCO/VICODIN) 5-325 MG tablet Take 1-2 tablets by mouth every 6 (six) hours as needed for moderate pain. 07/24/15   Fredia Sorrow, MD  Lancets MISC Use as directed 4 times a day.  DX: R73.09 06/13/14   Shawnee Knapp, MD  lisinopril (PRINIVIL,ZESTRIL) 40 MG tablet Take 1 tablet by mouth  daily 07/01/15   Shawnee Knapp, MD   metFORMIN (GLUCOPHAGE) 1000 MG tablet Take 1 tablet (1,000 mg total) by mouth 2 (two) times daily with a meal. 05/12/15   Shawnee Knapp, MD  oxybutynin (DITROPAN XL) 15 MG 24 hr tablet Take 1 tablet by mouth at  bedtime 07/01/15   Shawnee Knapp, MD  oxyCODONE-acetaminophen (ROXICET) 5-325 MG tablet Take 1-2 tablets by mouth every 4 (four) hours as needed for severe pain. Patient not taking: Reported on 07/29/2015 07/05/15   Shawnee Knapp, MD  pravastatin (PRAVACHOL) 40 MG tablet Take 1 tablet by mouth  daily 07/01/15   Shawnee Knapp, MD  predniSONE (DELTASONE) 10 MG tablet Take 4 tablets (40 mg total) by mouth daily. 07/24/15   Fredia Sorrow, MD  tiZANidine (ZANAFLEX) 4 MG tablet TAKE 1 TABLET (4 MG TOTAL) BY MOUTH AT BEDTIME. 05/12/15   Shawnee Knapp, MD  VENTOLIN HFA 108 (90 Base) MCG/ACT inhaler Inhale 2 puffs by mouth  every 6 hours as needed for wheezing 07/25/15   Shawnee Knapp, MD   BP 141/68 mmHg  Pulse 98  Temp(Src) 97.8 F (36.6 C) (Oral)  Resp 18  SpO2 100%  LMP 05/30/2013   Physical Exam  Constitutional: She is oriented to person, place, and time. She appears well-developed and well-nourished.  HENT:  Head: Normocephalic and atraumatic.  Mouth/Throat: Oropharynx is clear and moist. No oropharyngeal exudate.  Eyes: Conjunctivae and EOM are normal. Pupils are equal, round, and reactive to light.  Neck: Normal range of motion. Neck supple.  Cardiovascular: Normal rate, regular rhythm, normal heart sounds and intact distal pulses.  Exam reveals no gallop and no friction rub.   No murmur heard. RRR.   Pulmonary/Chest: Effort normal and breath sounds normal. No respiratory distress. She has no wheezes. She has no rales.  Lungs CTA bilaterally.   Abdominal: Soft. Bowel sounds are normal. She exhibits no distension. There is no rebound and no guarding.  Musculoskeletal: Normal range of motion.  Neurological: She is alert and oriented to person, place, and time. She has normal reflexes.  Skin: Skin is  warm and dry.  Psychiatric: She has a normal mood and affect.  Nursing note and vitals reviewed.   ED Course  Procedures (including critical care time)  DIAGNOSTIC STUDIES: Oxygen Saturation is 100% on RA, normal by my interpretation.   COORDINATION OF CARE: 3:14 AM-Discussed next steps with pt including CXR and a prescription for . Pt verbalized understanding and is agreeable with the plan.   Labs Review Labs Reviewed - No data to display  Imaging Review Dg Chest 2 View  08/04/2015  CLINICAL DATA:  Chest pain, cough, shortness of breath EXAM: CHEST  2 VIEW COMPARISON:  07/28/2015 FINDINGS: Lungs are clear.  No pleural effusion or pneumothorax. The heart is normal in size. Degenerative changes of the visualized thoracolumbar spine. IMPRESSION: No evidence of acute cardiopulmonary disease. Electronically Signed   By: Julian Hy M.D.   On: 08/04/2015 23:16   I have personally reviewed and evaluated these images and lab results as part of my medical decision-making.   EKG Interpretation None      MDM   Final diagnoses:  None   Filed Vitals:   08/04/15 2234 08/05/15 0235  BP: 120/63 141/68  Pulse: 70 98  Temp: 98 F (36.7 C) 97.8 F (36.6 C)  Resp: 18 18   Results for orders placed or performed during the hospital encounter of 07/28/15  CBC with Differential  Result Value Ref Range   WBC 23.1 (H) 4.0 - 10.5 K/uL   RBC 3.68 (L) 3.87 - 5.11 MIL/uL   Hemoglobin 9.6 (L) 12.0 - 15.0 g/dL   HCT 28.4 (L) 36.0 - 46.0 %   MCV 77.2 (L) 78.0 - 100.0 fL   MCH 26.1 26.0 - 34.0 pg   MCHC 33.8 30.0 - 36.0 g/dL   RDW 15.5 11.5 - 15.5 %   Platelets 655 (H) 150 - 400 K/uL   Neutrophils Relative % 71 %   Lymphocytes Relative 21 %   Monocytes Relative 7 %   Eosinophils Relative 1 %   Basophils Relative 0 %   Neutro Abs 16.4 (H) 1.7 - 7.7 K/uL   Lymphs Abs 4.9 (H) 0.7 - 4.0 K/uL   Monocytes Absolute 1.6 (H) 0.1 - 1.0 K/uL   Eosinophils Absolute 0.2 0.0 - 0.7 K/uL    Basophils Absolute 0.0 0.0 - 0.1 K/uL   WBC Morphology MILD LEFT SHIFT (1-5% METAS, OCC MYELO, OCC BANDS)   Basic metabolic panel  Result Value Ref Range   Sodium 134 (L) 135 - 145 mmol/L   Potassium 4.0 3.5 - 5.1 mmol/L   Chloride 102 101 - 111 mmol/L   CO2 24 22 - 32 mmol/L   Glucose, Bld 245 (H) 65 - 99 mg/dL   BUN 24 (H) 6 - 20 mg/dL   Creatinine, Ser 1.18 (H) 0.44 - 1.00 mg/dL   Calcium 9.4 8.9 - 10.3 mg/dL   GFR calc non Af Amer 51 (L) >60 mL/min   GFR calc Af Amer 59 (L) >60 mL/min   Anion gap 8 5 - 15  I-Stat Troponin, ED (not at [facility])  Result Value Ref Range   Troponin i, poc 0.00 0.00 - 0.08 ng/mL   Comment 3           Dg Chest 2 View  08/04/2015  CLINICAL DATA:  Chest pain, cough, shortness of breath EXAM: CHEST  2 VIEW COMPARISON:  07/28/2015 FINDINGS: Lungs are clear.  No pleural effusion or pneumothorax. The heart is normal in size. Degenerative changes of the visualized thoracolumbar spine. IMPRESSION: No evidence of acute cardiopulmonary disease. Electronically Signed   By: Julian Hy M.D.   On: 08/04/2015 23:16   Dg Chest 2 View  07/28/2015  CLINICAL DATA:  Shortness of breath and productive cough intermittently 1 week. EXAM: CHEST  2 VIEW COMPARISON:  10/19/2007 FINDINGS: Lungs are adequately inflated with subtle patchy density in the medial right base which may be due to early infection versus vascular crowding. Cardiomediastinal silhouette and remainder of the exam is unchanged. IMPRESSION: Mild patchy opacification in the medial right base which may be due to early infection versus vascular crowding. Electronically Signed   By:  Marin Olp M.D.   On: 07/28/2015 21:52    Medications  ipratropium-albuterol (DUONEB) 0.5-2.5 (3) MG/3ML nebulizer solution 3 mL (3 mLs Nebulization Given 08/05/15 0312)    Doing better post nebs PNA has cleared.  PERC negative stable for discharge with close follow up.  Strict return precautions given   I personally performed the  services described in this documentation, which was scribed in my presence. The recorded information has been reviewed and is accurate.     Veatrice Kells, MD 08/10/15 2340

## 2015-08-10 ENCOUNTER — Ambulatory Visit (INDEPENDENT_AMBULATORY_CARE_PROVIDER_SITE_OTHER): Payer: Medicare Other | Admitting: Physician Assistant

## 2015-08-10 ENCOUNTER — Encounter (HOSPITAL_COMMUNITY): Payer: Self-pay | Admitting: Emergency Medicine

## 2015-08-10 VITALS — BP 128/80 | HR 100 | Temp 98.0°F | Resp 18 | Ht 62.0 in | Wt 218.2 lb

## 2015-08-10 DIAGNOSIS — R5383 Other fatigue: Secondary | ICD-10-CM

## 2015-08-10 DIAGNOSIS — M25561 Pain in right knee: Secondary | ICD-10-CM | POA: Diagnosis not present

## 2015-08-10 DIAGNOSIS — K5900 Constipation, unspecified: Secondary | ICD-10-CM

## 2015-08-10 DIAGNOSIS — E559 Vitamin D deficiency, unspecified: Secondary | ICD-10-CM | POA: Diagnosis not present

## 2015-08-10 LAB — COMPREHENSIVE METABOLIC PANEL
ALT: 16 U/L (ref 6–29)
AST: 14 U/L (ref 10–35)
Albumin: 3.6 g/dL (ref 3.6–5.1)
Alkaline Phosphatase: 93 U/L (ref 33–130)
BUN: 23 mg/dL (ref 7–25)
CO2: 23 mmol/L (ref 20–31)
Calcium: 9 mg/dL (ref 8.6–10.4)
Chloride: 98 mmol/L (ref 98–110)
Creat: 1.31 mg/dL — ABNORMAL HIGH (ref 0.50–1.05)
Glucose, Bld: 252 mg/dL — ABNORMAL HIGH (ref 65–99)
Potassium: 4.1 mmol/L (ref 3.5–5.3)
Sodium: 131 mmol/L — ABNORMAL LOW (ref 135–146)
Total Bilirubin: 0.3 mg/dL (ref 0.2–1.2)
Total Protein: 6.6 g/dL (ref 6.1–8.1)

## 2015-08-10 LAB — CBC
HCT: 30.8 % — ABNORMAL LOW (ref 35.0–45.0)
Hemoglobin: 10.2 g/dL — ABNORMAL LOW (ref 11.7–15.5)
MCH: 26.6 pg — ABNORMAL LOW (ref 27.0–33.0)
MCHC: 33.1 g/dL (ref 32.0–36.0)
MCV: 80.2 fL (ref 80.0–100.0)
MPV: 9.9 fL (ref 7.5–12.5)
Platelets: 375 10*3/uL (ref 140–400)
RBC: 3.84 MIL/uL (ref 3.80–5.10)
RDW: 17.4 % — ABNORMAL HIGH (ref 11.0–15.0)
WBC: 9.4 10*3/uL (ref 3.8–10.8)

## 2015-08-10 LAB — TSH: TSH: 0.56 mIU/L

## 2015-08-10 NOTE — Patient Instructions (Addendum)
     IF you received an x-ray today, you will receive an invoice from [facility]. Please contact [facility] at [phone] with questions or concerns regarding your invoice.   IF you received labwork today, you will receive an invoice from [facility]. Please contact [facility] at [phone] with questions or concerns regarding your invoice.   Our billing staff will not be able to assist you with questions regarding bills from these companies.  You will be contacted with the lab results as soon as they are available. The fastest way to get your results is to activate your My Chart account. Instructions are located on the last page of this paperwork. If you have not heard from us regarding the results in 2 weeks, please contact this office.     To help reduce constipation and promote bowel health, 1. Drink at least 64 ounces of water each day; 2. Eat plenty of fiber (fruits, vegetables, whole grains, legumes) 3. Get plenty of physical activity  If needed, use a stool softener (docusate) or an osmotic laxative (like Miralax) each day, or as needed.  The Miralax can be used two or three times each day initially to help get you moving.

## 2015-08-10 NOTE — Progress Notes (Signed)
Patient ID: Melissa Chavez, female    DOB: 1961/01/12, 55 y.o.   MRN: [id number]  PCP: Delman Cheadle, MD  Subjective:   Chief Complaint  Patient presents with  . Constipation    x1 week    HPI Presents for evaluation of constipation x1week.  Her mother has been ill, requiring hospitalization and rehab. They are making preparations for her to move in with the patient upon discharge.   "I was so busy caring for my mother I didn't even realize I had not been to the bathroom in several days."  "I feel like my stomach is churning, Not a lot of pain, sort of feels like I'm about to come on my cycle, but its not that. I haven't had a cycle in 2 years."  She contacted the nurse hotline associated with her insurance, and the nurse suggested drinking hot liquids, taking metamucil, and to seek medical advise in the morning."  In general has minimal fiber intake, and drinks only 16-20oz water daily.  Fatigue, flatulence, "I feel like I have to go, I strain, stop and nothing is there."  No history of constipation, change in urination, nausea, vomiting, diarrhea, hematochezia, chest pain, SOB, or reflux.  I saw her 5/18 with cough x 10 days. She'd had symptoms of a viral URI and was treated for post-viral cough with tessalon and supportive care.  Chart review reveals that she presented to the ED 5/19 with knee pain. She had been advised by our office that a refill of oxycodone previously prescribed for gout required a re-evaluation earlier in the day. Exam revealed a RIGHT knee with swelling and increased warmth, without redness, and with mildly reduced ROM. She was prescribed course of oral steroids and short course of hydrocodone.  She presented to the ED again on 5/23 complaining of SOB x 1 days. She reported that the prednisone for her knee pain was making her feel "like she is going crazy." She described irritability and difficulty sleeping. Exam revealed vesicles on the upper lip and  wheezing. CXR revealed pneumonia. She was treated with azithromycin.  On 5/30 she returned complaining of worsening SOB and wheezing. She improved following nebulized treatment.   Review of Systems  Constitutional: Positive for fatigue. Negative for fever, chills and appetite change.  Respiratory: Negative for shortness of breath.   Cardiovascular: Negative for chest pain, palpitations and leg swelling.  Gastrointestinal: Positive for constipation. Negative for nausea, vomiting, abdominal pain, diarrhea, blood in stool, abdominal distention, anal bleeding and rectal pain.  Endocrine: Negative for cold intolerance, heat intolerance, polydipsia and polyuria.  Genitourinary: Negative for dysuria, urgency, frequency and hematuria.  Neurological: Negative for dizziness and headaches.       Patient Active Problem List   Diagnosis Date Noted  . Abdominal pain, chronic, epigastric 06/30/2015  . Osteoarthritis of right foot 03/22/2015  . Type 2 diabetes mellitus with diabetic polyneuropathy, without long-term current use of insulin ([place]) 03/20/2015  . Morbid obesity ([place]) 06/13/2013  . Overactive bladder 06/13/2013  . Allergic rhinitis 06/13/2013  . Asthma, moderate persistent 06/13/2013  . BIPOLAR DISORDER UNSPECIFIED 03/18/2008  . Vaginal leukorrhea 10/05/2007  . HYPOKALEMIA 08/24/2007  . PERIPHERAL EDEMA 07/03/2007  . CONTUSION, RIGHT FOOT 04/23/2007  . GERD 01/30/2007  . HYPERCHOLESTEROLEMIA 12/07/2006  . Essential hypertension 12/07/2006  . ASTHMA 12/07/2006  . Osteoarthritis of right knee 12/07/2006  . ANEMIA, IRON DEFICIENCY 01/06/2006     Prior to Admission medications   Medication Sig Start Date End Date Taking?  Authorizing Provider  ADVAIR DISKUS 250-50 MCG/DOSE AEPB Inhale 1 puff by mouth into the lungs two times daily 07/01/15  Yes Shawnee Knapp, MD  blood glucose meter kit and supplies KIT Use as directed 4 times a day.  DX: R73.09 06/13/14  Yes Shawnee Knapp, MD  cetirizine  (ZYRTEC) 10 MG tablet Take 1 tablet (10 mg total) by mouth at bedtime. 05/11/15  Yes Shawnee Knapp, MD  diclofenac sodium (VOLTAREN) 1 % GEL Apply 4 g topically 2 (two) times daily. 06/30/15  Yes Darlyne Russian, MD  fluticasone (FLONASE) 50 MCG/ACT nasal spray PLACE 2 SPRAYS INTO BOTH NOSTRILS AT BEDTIME. 07/28/15  Yes Shawnee Knapp, MD  glucose blood test strip Use as directed 4 times a day.  DX: R73.09 06/13/14  Yes Shawnee Knapp, MD  HYDROcodone-acetaminophen (NORCO/VICODIN) 5-325 MG tablet Take 1-2 tablets by mouth every 6 (six) hours as needed for moderate pain. 07/24/15  Yes Fredia Sorrow, MD  ipratropium (ATROVENT HFA) 17 MCG/ACT inhaler Inhale 2 puffs into the lungs every 4 (four) hours as needed for wheezing. 08/05/15  Yes April Palumbo, MD  Lancets MISC Use as directed 4 times a day.  DX: R73.09 06/13/14  Yes Shawnee Knapp, MD  lisinopril (PRINIVIL,ZESTRIL) 40 MG tablet Take 1 tablet by mouth  daily 07/01/15  Yes Shawnee Knapp, MD  metFORMIN (GLUCOPHAGE) 1000 MG tablet Take 1 tablet (1,000 mg total) by mouth 2 (two) times daily with a meal. 05/12/15  Yes Shawnee Knapp, MD  oxybutynin (DITROPAN XL) 15 MG 24 hr tablet Take 1 tablet by mouth at  bedtime 07/01/15  Yes Shawnee Knapp, MD  pravastatin (PRAVACHOL) 40 MG tablet Take 1 tablet by mouth  daily 07/01/15  Yes Shawnee Knapp, MD  tiZANidine (ZANAFLEX) 4 MG tablet TAKE 1 TABLET (4 MG TOTAL) BY MOUTH AT BEDTIME. 05/12/15  Yes Shawnee Knapp, MD  VENTOLIN HFA 108 [phone] Base) MCG/ACT inhaler Inhale 2 puffs by mouth  every 6 hours as needed for wheezing 07/25/15  Yes Shawnee Knapp, MD  oxyCODONE-acetaminophen (ROXICET) 5-325 MG tablet Take 1-2 tablets by mouth every 4 (four) hours as needed for severe pain. Patient not taking: Reported on 07/29/2015 07/05/15   Shawnee Knapp, MD     Allergies  Allergen Reactions  . Fish Allergy Anaphylaxis  . Banana Other (See Comments)    unknown       Objective:  Physical Exam  Constitutional: She is oriented to person, place, and time. She appears  well-developed and well-nourished. She is active and cooperative. No distress.  BP 128/80 mmHg  Pulse 100  Temp(Src) 98 F (36.7 C) (Oral)  Resp 18  Ht '5\' 2"'  (1.575 m)  Wt 218 lb 3.2 oz (98.975 kg)  BMI 39.90 kg/m2  SpO2 98%  LMP 05/30/2013  HENT:  Head: Normocephalic and atraumatic.  Right Ear: Hearing normal.  Left Ear: Hearing normal.  Eyes: Conjunctivae are normal. No scleral icterus.  Neck: Normal range of motion. Neck supple. No thyromegaly present.  Cardiovascular: Normal rate, regular rhythm and normal heart sounds.   Pulses:      Radial pulses are 2+ on the right side, and 2+ on the left side.  Pulmonary/Chest: Effort normal and breath sounds normal.  Abdominal: Soft. Bowel sounds are normal.  Lymphadenopathy:       Head (right side): No tonsillar, no preauricular, no posterior auricular and no occipital adenopathy present.       Head (  left side): No tonsillar, no preauricular, no posterior auricular and no occipital adenopathy present.    She has no cervical adenopathy.       Right: No supraclavicular adenopathy present.       Left: No supraclavicular adenopathy present.  Neurological: She is alert and oriented to person, place, and time. No sensory deficit.  Skin: Skin is warm, dry and intact. No rash noted. No cyanosis or erythema. Nails show no clubbing.  Psychiatric: She has a normal mood and affect. Her speech is normal and behavior is normal.           Assessment & Plan:   1. Constipation, unspecified constipation type 2. Other fatigue OTC Miralax. Increase oral hydration, dietary fiber and physical activity. - TSH - VITAMIN D 25 Hydroxy (Vit-D Deficiency, Fractures) - CBC - Comprehensive metabolic panel   Fara Chute, PA-C Physician Assistant-Certified [facility]

## 2015-08-10 NOTE — Progress Notes (Signed)
Subjective:    Patient ID: Melissa Chavez, female    DOB: 02-Jun-1960, 54 y.o.   MRN: [id number]  Chief Complaint  Patient presents with  . Constipation    x1 week   HPI  Patient presents today with complaints of constipation x1week.  States "I was so busy caring for my mother I didn't even realize I had not been to the bathroom in several days."  Reports "I feel like my stomach is churning, Not a lot of pain, sort of feels like she I'm about to come on my cycle, but its not that I haven't had a cycle in 2 years."   Nurse hotline suggested drinking hot liquids, take metamucil, and seek medical advise in the morning."  -Patient has minimal fiber intake, and drinks 16-20oz water daily.  -Admits to fatigue, flatulence, "I feel like I have to go, I strain, stop and nothing is there."  -Denies history of constipation, change in urination, nausea, vomiting, diarrhea, hematochezia, chest pain, SOB, or reflux.  Review of Systems All others negative except those listed in HPI.  Patient Active Problem List   Diagnosis Date Noted  . Abdominal pain, chronic, epigastric 06/30/2015  . Osteoarthritis of right foot 03/22/2015  . Type 2 diabetes mellitus with diabetic polyneuropathy, without long-term current use of insulin ([place]) 03/20/2015  . Morbid obesity ([place]) 06/13/2013  . Overactive bladder 06/13/2013  . Allergic rhinitis 06/13/2013  . Asthma, moderate persistent 06/13/2013  . BIPOLAR DISORDER UNSPECIFIED 03/18/2008  . Vaginal leukorrhea 10/05/2007  . HYPOKALEMIA 08/24/2007  . PERIPHERAL EDEMA 07/03/2007  . CONTUSION, RIGHT FOOT 04/23/2007  . GERD 01/30/2007  . HYPERCHOLESTEROLEMIA 12/07/2006  . Essential hypertension 12/07/2006  . ASTHMA 12/07/2006  . Osteoarthritis of right knee 12/07/2006  . ANEMIA, IRON DEFICIENCY 01/06/2006   Current Outpatient Prescriptions on File Prior to Visit  Medication Sig Dispense Refill  . ADVAIR DISKUS 250-50 MCG/DOSE AEPB Inhale 1 puff by mouth  into the lungs two times daily 180 each 0  . blood glucose meter kit and supplies KIT Use as directed 4 times a day.  DX: R73.09 1 each 0  . cetirizine (ZYRTEC) 10 MG tablet Take 1 tablet (10 mg total) by mouth at bedtime. 90 tablet 3  . diclofenac sodium (VOLTAREN) 1 % GEL Apply 4 g topically 2 (two) times daily. 100 g 1  . fluticasone (FLONASE) 50 MCG/ACT nasal spray PLACE 2 SPRAYS INTO BOTH NOSTRILS AT BEDTIME. 48 g 3  . glucose blood test strip Use as directed 4 times a day.  DX: R73.09 100 each 2  . HYDROcodone-acetaminophen (NORCO/VICODIN) 5-325 MG tablet Take 1-2 tablets by mouth every 6 (six) hours as needed for moderate pain. 10 tablet 0  . ipratropium (ATROVENT HFA) 17 MCG/ACT inhaler Inhale 2 puffs into the lungs every 4 (four) hours as needed for wheezing. 1 Inhaler 0  . Lancets MISC Use as directed 4 times a day.  DX: R73.09 100 each 2  . lisinopril (PRINIVIL,ZESTRIL) 40 MG tablet Take 1 tablet by mouth  daily 90 tablet 0  . metFORMIN (GLUCOPHAGE) 1000 MG tablet Take 1 tablet (1,000 mg total) by mouth 2 (two) times daily with a meal. 180 tablet 2  . oxybutynin (DITROPAN XL) 15 MG 24 hr tablet Take 1 tablet by mouth at  bedtime 90 tablet 0  . pravastatin (PRAVACHOL) 40 MG tablet Take 1 tablet by mouth  daily 90 tablet 0  . tiZANidine (ZANAFLEX) 4 MG tablet TAKE 1 TABLET (4 MG  TOTAL) BY MOUTH AT BEDTIME. 90 tablet 0  . VENTOLIN HFA 108 (90 Base) MCG/ACT inhaler Inhale 2 puffs by mouth  every 6 hours as needed for wheezing 54 g 0  . oxyCODONE-acetaminophen (ROXICET) 5-325 MG tablet Take 1-2 tablets by mouth every 4 (four) hours as needed for severe pain. (Patient not taking: Reported on 07/29/2015) 60 tablet 0   No current facility-administered medications on file prior to visit.   Allergies  Allergen Reactions  . Fish Allergy Anaphylaxis  . Banana Other (See Comments)    unknown      Objective: BP 128/80 mmHg  Pulse 100  Temp(Src) 98 F (36.7 C) (Oral)  Resp 18  Ht '5\' 2"'   (1.575 m)  Wt 218 lb 3.2 oz (98.975 kg)  BMI 39.90 kg/m2  SpO2 98%  LMP 05/30/2013   Physical Exam  Constitutional: She is oriented to person, place, and time. She appears well-developed and well-nourished.  Cardiovascular: Normal rate, regular rhythm and normal heart sounds.  Exam reveals decreased pulses.   Pulses:      Dorsalis pedis pulses are 1+ on the right side, and 1+ on the left side.  Pulmonary/Chest: Effort normal and breath sounds normal. No respiratory distress. She has no wheezes. She has no rales. She exhibits no tenderness.  Abdominal: Soft. She exhibits no distension and no mass. Bowel sounds are increased. There is tenderness in the right upper quadrant, right lower quadrant, left upper quadrant and left lower quadrant. There is no rigidity and no guarding.  Musculoskeletal:  Bilateral lower extremity pitting edema 2+  Neurological: She is alert and oriented to person, place, and time.       Assessment & Plan:  1. Constipation, unspecified constipation type 2. Other fatigue - TSH - VITAMIN D 25 Hydroxy (Vit-D Deficiency, Fractures) - CBC - Comprehensive metabolic panel  Previous lab results from ED visit, 07/28/2015 had abnormal findings, repeated labs, results pending.  Will contact patient if labs return abnormal and initiate management as indicated.     Recommended patient increase water intake to at least 64oz daily. >>Increase fiber intake, including vegetables, whole grains, leguemes and limited fruits. >>Suggested Miralax 2-3x day initially to increase motility.   >>Stool softener, such as docusate, can also be added as needed.  Laurita Peron P. Mckynna Vanloan, PA-S

## 2015-08-11 LAB — VITAMIN D 25 HYDROXY (VIT D DEFICIENCY, FRACTURES): Vit D, 25-Hydroxy: 45 ng/mL (ref 30–100)

## 2015-08-17 ENCOUNTER — Ambulatory Visit (HOSPITAL_COMMUNITY)
Admission: EM | Admit: 2015-08-17 | Discharge: 2015-08-17 | Discharge status: Absent without leave | Payer: Medicare Other

## 2015-08-17 ENCOUNTER — Emergency Department (HOSPITAL_COMMUNITY)
Admission: EM | Admit: 2015-08-17 | Discharge: 2015-08-17 | Disposition: A | Discharge status: Home / Self Care | Payer: Medicare Other

## 2015-08-19 ENCOUNTER — Emergency Department (HOSPITAL_COMMUNITY)
Admission: EM | Admit: 2015-08-19 | Discharge: 2015-08-20 | Disposition: A | Discharge status: Home / Self Care | Payer: Medicare Other | Attending: Emergency Medicine | Admitting: Emergency Medicine

## 2015-08-19 ENCOUNTER — Encounter (HOSPITAL_COMMUNITY): Payer: Self-pay | Admitting: *Deleted

## 2015-08-19 DIAGNOSIS — Z046 Encounter for general psychiatric examination, requested by authority: Secondary | ICD-10-CM | POA: Diagnosis present

## 2015-08-19 DIAGNOSIS — T380X5D Adverse effect of glucocorticoids and synthetic analogues, subsequent encounter: Secondary | ICD-10-CM | POA: Diagnosis not present

## 2015-08-19 DIAGNOSIS — F419 Anxiety disorder, unspecified: Secondary | ICD-10-CM

## 2015-08-19 DIAGNOSIS — F329 Major depressive disorder, single episode, unspecified: Secondary | ICD-10-CM

## 2015-08-19 DIAGNOSIS — J45909 Unspecified asthma, uncomplicated: Secondary | ICD-10-CM | POA: Diagnosis not present

## 2015-08-19 DIAGNOSIS — F3181 Bipolar II disorder: Secondary | ICD-10-CM | POA: Insufficient documentation

## 2015-08-19 DIAGNOSIS — Z79899 Other long term (current) drug therapy: Secondary | ICD-10-CM | POA: Diagnosis not present

## 2015-08-19 DIAGNOSIS — M25561 Pain in right knee: Secondary | ICD-10-CM | POA: Diagnosis not present

## 2015-08-19 DIAGNOSIS — F32A Depression, unspecified: Secondary | ICD-10-CM

## 2015-08-19 DIAGNOSIS — F418 Other specified anxiety disorders: Secondary | ICD-10-CM | POA: Insufficient documentation

## 2015-08-19 DIAGNOSIS — T380X5A Adverse effect of glucocorticoids and synthetic analogues, initial encounter: Secondary | ICD-10-CM | POA: Diagnosis present

## 2015-08-19 DIAGNOSIS — Z7984 Long term (current) use of oral hypoglycemic drugs: Secondary | ICD-10-CM | POA: Diagnosis not present

## 2015-08-19 DIAGNOSIS — I10 Essential (primary) hypertension: Secondary | ICD-10-CM | POA: Diagnosis not present

## 2015-08-19 DIAGNOSIS — E119 Type 2 diabetes mellitus without complications: Secondary | ICD-10-CM | POA: Insufficient documentation

## 2015-08-19 DIAGNOSIS — R451 Restlessness and agitation: Secondary | ICD-10-CM | POA: Diagnosis not present

## 2015-08-19 LAB — COMPREHENSIVE METABOLIC PANEL WITH GFR
ALT: 15 U/L (ref 14–54)
AST: 16 U/L (ref 15–41)
Albumin: 3.5 g/dL (ref 3.5–5.0)
Alkaline Phosphatase: 74 U/L (ref 38–126)
Anion gap: 7 (ref 5–15)
BUN: 16 mg/dL (ref 6–20)
CO2: 23 mmol/L (ref 22–32)
Calcium: 8.5 mg/dL — ABNORMAL LOW (ref 8.9–10.3)
Chloride: 109 mmol/L (ref 101–111)
Creatinine, Ser: 1.19 mg/dL — ABNORMAL HIGH (ref 0.44–1.00)
GFR calc Af Amer: 58 mL/min — ABNORMAL LOW
GFR calc non Af Amer: 50 mL/min — ABNORMAL LOW
Glucose, Bld: 220 mg/dL — ABNORMAL HIGH (ref 65–99)
Potassium: 3.3 mmol/L — ABNORMAL LOW (ref 3.5–5.1)
Sodium: 139 mmol/L (ref 135–145)
Total Bilirubin: 0.5 mg/dL (ref 0.3–1.2)
Total Protein: 6.8 g/dL (ref 6.5–8.1)

## 2015-08-19 LAB — ETHANOL: Alcohol, Ethyl (B): <5 mg/dL (ref <5)

## 2015-08-19 LAB — RAPID URINE DRUG SCREEN, HOSP PERFORMED
Amphetamines: NOT DETECTED
Barbiturates: NOT DETECTED
Benzodiazepines: NOT DETECTED
Cocaine: NOT DETECTED
Opiates: NOT DETECTED
Tetrahydrocannabinol: NOT DETECTED

## 2015-08-19 LAB — CBC
HCT: 28.8 % — ABNORMAL LOW (ref 36.0–46.0)
Hemoglobin: 9.8 g/dL — ABNORMAL LOW (ref 12.0–15.0)
MCH: 26.8 pg (ref 26.0–34.0)
MCHC: 34 g/dL (ref 30.0–36.0)
MCV: 78.9 fL (ref 78.0–100.0)
Platelets: 336 K/uL (ref 150–400)
RBC: 3.65 MIL/uL — ABNORMAL LOW (ref 3.87–5.11)
RDW: 17.3 % — ABNORMAL HIGH (ref 11.5–15.5)
WBC: 9.6 K/uL (ref 4.0–10.5)

## 2015-08-19 LAB — ACETAMINOPHEN LEVEL: Acetaminophen (Tylenol), Serum: <10 ug/mL — ABNORMAL LOW (ref 10–30)

## 2015-08-19 LAB — SALICYLATE LEVEL: Salicylate Lvl: <4 mg/dL (ref 2.8–30.0)

## 2015-08-19 MED ORDER — IPRATROPIUM BROMIDE HFA 17 MCG/ACT IN AERS: 2.0000 | INHALATION_SPRAY | RESPIRATORY_TRACT | Status: DC | PRN

## 2015-08-19 MED ORDER — LORATADINE 10 MG PO TABS
10.0000 mg | ORAL_TABLET | Freq: Every day | ORAL | Status: DC
  Administered 2015-08-20: 10 mg via ORAL
  Filled 2015-08-19: qty 1

## 2015-08-19 MED ORDER — TRIAMTERENE-HCTZ 37.5-25 MG PO TABS
1.0000 | ORAL_TABLET | Freq: Every day | ORAL | Status: DC
  Administered 2015-08-20: 1 via ORAL
  Filled 2015-08-19: qty 1

## 2015-08-19 MED ORDER — MELOXICAM 15 MG PO TABS
15.0000 mg | ORAL_TABLET | Freq: Every day | ORAL | Status: DC
Start: 2015-08-20 — End: 2015-08-20
  Administered 2015-08-20: 15 mg via ORAL
  Filled 2015-08-19: qty 1

## 2015-08-19 MED ORDER — ZIPRASIDONE MESYLATE 20 MG IM SOLR: 20.0000 mg | Freq: Once | INTRAMUSCULAR | Status: DC

## 2015-08-19 MED ORDER — FLUTICASONE PROPIONATE 50 MCG/ACT NA SUSP
1.0000 | Freq: Every day | NASAL | Status: DC
  Filled 2015-08-19: qty 16

## 2015-08-19 MED ORDER — MOMETASONE FURO-FORMOTEROL FUM 200-5 MCG/ACT IN AERO
2.0000 | INHALATION_SPRAY | Freq: Two times a day (BID) | RESPIRATORY_TRACT | Status: DC
  Administered 2015-08-20: 2 via RESPIRATORY_TRACT
  Filled 2015-08-19: qty 8.8

## 2015-08-19 MED ORDER — METFORMIN HCL 500 MG PO TABS
1000.0000 mg | ORAL_TABLET | Freq: Two times a day (BID) | ORAL | Status: DC
  Administered 2015-08-20: 1000 mg via ORAL
  Filled 2015-08-19 (×3): qty 2

## 2015-08-19 MED ORDER — ALBUTEROL SULFATE HFA 108 (90 BASE) MCG/ACT IN AERS
1.0000 | INHALATION_SPRAY | Freq: Four times a day (QID) | RESPIRATORY_TRACT | Status: DC
  Administered 2015-08-20: 1 via RESPIRATORY_TRACT
  Filled 2015-08-19: qty 6.7

## 2015-08-19 MED ORDER — PRAVASTATIN SODIUM 40 MG PO TABS
40.0000 mg | ORAL_TABLET | Freq: Every day | ORAL | Status: DC
  Filled 2015-08-19: qty 1

## 2015-08-19 NOTE — ED Notes (Signed)
Patient upset and wants to leave.  Patient raising her voice and wants to be seen by doctor states " I have been waiting too long and I am going to leave"  Asked patient to wait to be seen by doctor in order to help.  Patient currently sitting in chair with family member at bedside.

## 2015-08-19 NOTE — ED Provider Notes (Signed)
CSN: [id number]     Arrival date & time 08/19/15  2036 History   First MD Initiated Contact with Patient 08/19/15 2126     Chief Complaint  Patient presents with  . Psychiatric Evaluation     (Consider location/radiation/quality/duration/timing/severity/associated sxs/prior Treatment) HPI Comments: This a 55 year old female with a history of bipolar disease who is not taking any medication for number of years, but in by her daughter who states that her behavior over the last month, bizarre.  She is not sleeping leaving the hospital for 6 in the morning, not coming home until 10 or midnight at night.  She is very argumentative having flight of ideas, constantly contradicting her daughter who is trying to give a concise history. She states that she has to have her knee, surgically repaired, per daughter.  She's been to a number of urgent cares over the past month, demanding immediate surgery.  Her daughter states that she was put on steroids approximately 3 weeks ago for her respiratory issues and this has exacerbated the psychiatric problem.  Patient is stating that she needs to be checked for lithium, although she has not taken lithium and years.   The history is provided by the patient and a relative.    Past Medical History  Diagnosis Date  . Hypertension   . Hernia   . Asthma   . Allergy   . Glaucoma   . Anemia   . Diabetes mellitus without complication ([place])   . GERD (gastroesophageal reflux disease)   . Bipolar 2 disorder [facility])    Past Surgical History  Procedure Laterality Date  . Hernia repair    . Tubal ligation    . Tonsillectomy     Family History  Problem Relation Age of Onset  . Hypertension Mother   . Diabetes Mother   . Hyperlipidemia Mother   . Stroke Mother   . Heart disease Mother   . Pulmonary embolism Mother   . Hypertension Father   . Diabetes Father   . Hyperlipidemia Father    Social History  Substance Use Topics  . Smoking status: Never Smoker    . Smokeless tobacco: Never Used  . Alcohol Use: No   OB History    No data available     Review of Systems  Respiratory: Negative for cough and shortness of breath.   Gastrointestinal: Negative for abdominal pain.  Musculoskeletal: Positive for arthralgias.  Neurological: Negative for headaches.  Psychiatric/Behavioral: Positive for behavioral problems and sleep disturbance. Negative for suicidal ideas, hallucinations and self-injury.  All other systems reviewed and are negative.     Allergies  Fish allergy and Banana  Home Medications   Prior to Admission medications   Medication Sig Start Date End Date Taking? Authorizing Provider  ADVAIR DISKUS 250-50 MCG/DOSE AEPB Inhale 1 puff by mouth into the lungs two times daily 07/01/15  Yes Shawnee Knapp, MD  cetirizine (ZYRTEC) 10 MG tablet Take 1 tablet (10 mg total) by mouth at bedtime. 05/11/15  Yes Shawnee Knapp, MD  diclofenac sodium (VOLTAREN) 1 % GEL Apply 4 g topically 2 (two) times daily. 06/30/15  Yes Darlyne Russian, MD  ipratropium (ATROVENT HFA) 17 MCG/ACT inhaler Inhale 2 puffs into the lungs every 4 (four) hours as needed for wheezing. 08/05/15  Yes April Palumbo, MD  lisinopril (PRINIVIL,ZESTRIL) 40 MG tablet Take 1 tablet by mouth  daily 07/01/15  Yes Shawnee Knapp, MD  meloxicam (MOBIC) 15 MG tablet Take 15 mg by  mouth daily.  08/17/15  Yes Historical Provider, MD  metFORMIN (GLUCOPHAGE) 1000 MG tablet Take 1 tablet (1,000 mg total) by mouth 2 (two) times daily with a meal. 05/12/15  Yes Shawnee Knapp, MD  oxybutynin (DITROPAN XL) 15 MG 24 hr tablet Take 1 tablet by mouth at  bedtime 07/01/15  Yes Shawnee Knapp, MD  pravastatin (PRAVACHOL) 40 MG tablet Take 1 tablet by mouth  daily 07/01/15  Yes Shawnee Knapp, MD  tiZANidine (ZANAFLEX) 4 MG tablet TAKE 1 TABLET (4 MG TOTAL) BY MOUTH AT BEDTIME. 05/12/15  Yes Shawnee Knapp, MD  triamterene-hydrochlorothiazide (MAXZIDE-25) 37.5-25 MG tablet Take 1 tablet by mouth daily.  07/20/15  Yes Historical Provider,  MD  VENTOLIN HFA 108 (90 Base) MCG/ACT inhaler Inhale 2 puffs by mouth  every 6 hours as needed for wheezing 07/25/15  Yes Shawnee Knapp, MD  blood glucose meter kit and supplies KIT Use as directed 4 times a day.  DX: R73.09 06/13/14   Shawnee Knapp, MD  colchicine 0.6 MG tablet TAKE 2 TABLETS BY MOUTH NOW AND THEN 1 TABLET 1 HOUR LATER AS NEEDED FOR GOUT FLARE UP 07/05/15   Historical Provider, MD  fluticasone (FLONASE) 50 MCG/ACT nasal spray PLACE 2 SPRAYS INTO BOTH NOSTRILS AT BEDTIME. Patient not taking: Reported on 08/19/2015 07/28/15   Shawnee Knapp, MD  glucose blood test strip Use as directed 4 times a day.  DX: R73.09 06/13/14   Shawnee Knapp, MD  HYDROcodone-acetaminophen (NORCO/VICODIN) 5-325 MG tablet Take 1-2 tablets by mouth every 6 (six) hours as needed for moderate pain. Patient not taking: Reported on 08/19/2015 07/24/15   Fredia Sorrow, MD  Lancets MISC Use as directed 4 times a day.  DX: R73.09 06/13/14   Shawnee Knapp, MD  oxyCODONE-acetaminophen (ROXICET) 5-325 MG tablet Take 1-2 tablets by mouth every 4 (four) hours as needed for severe pain. Patient not taking: Reported on 07/29/2015 07/05/15   Shawnee Knapp, MD   BP 147/119 mmHg  Pulse 20  Temp(Src) 98 F (36.7 C) (Oral)  Resp 20  SpO2 96%  LMP 05/30/2013 Physical Exam  Constitutional: She is oriented to person, place, and time. She appears well-developed and well-nourished.  HENT:  Head: Normocephalic.  Eyes: Pupils are equal, round, and reactive to light.  Neck: Normal range of motion.  Cardiovascular: Normal rate and regular rhythm.   Pulmonary/Chest: Effort normal.  Musculoskeletal: Normal range of motion. She exhibits tenderness. She exhibits no edema.       Right knee: Tenderness found.  Neurological: She is alert and oriented to person, place, and time.  Skin: Skin is warm and dry.  Psychiatric: Her mood appears anxious. Her affect is labile. Her speech is tangential. She is agitated. Cognition and memory are normal. She expresses  impulsivity and inappropriate judgment.  Nursing note and vitals reviewed.   ED Course  Procedures (including critical care time) Labs Review Labs Reviewed  COMPREHENSIVE METABOLIC PANEL - Abnormal; Notable for the following:    Potassium 3.3 (*)    Glucose, Bld 220 (*)    Creatinine, Ser 1.19 (*)    Calcium 8.5 (*)    GFR calc non Af Amer 50 (*)    GFR calc Af Amer 58 (*)    All other components within normal limits  ACETAMINOPHEN LEVEL - Abnormal; Notable for the following:    Acetaminophen (Tylenol), Serum <10 (*)    All other components within normal limits  CBC -  Abnormal; Notable for the following:    RBC 3.65 (*)    Hemoglobin 9.8 (*)    HCT 28.8 (*)    RDW 17.3 (*)    All other components within normal limits  ETHANOL  SALICYLATE LEVEL  URINE RAPID DRUG SCREEN, HOSP PERFORMED    Imaging Review No results found. I have personally reviewed and evaluated these images and lab results as part of my medical decision-making.   EKG Interpretation None     Patient history using a medication to help calm her.  She is refusing to change clothes.  She is refusing therapies.  She is denies suicidality and homicidality.  His verbally hostile Patient has been evaluated by TTS he meets criteria for admission MDM   Final diagnoses:  Anxiety  Depression         Junius Creamer, NP 08/20/15 [id number]  Isla Pence, MD 08/20/15 [phone]

## 2015-08-19 NOTE — ED Notes (Signed)
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

## 2015-08-19 NOTE — ED Notes (Signed)
Pt states she is "not feeling herself" for the past 3 weeks. Pt states she started feeling strange when she began taking prednisone, which she stopped taking ~2 weeks ago. Pt's daughter states the pt has been behaving more aggressively lately.

## 2015-08-20 ENCOUNTER — Inpatient Hospital Stay: Admission: RE | Admit: 2015-08-20 | Payer: Medicare Other | Source: Intra-hospital | Admitting: Psychiatry

## 2015-08-20 DIAGNOSIS — R451 Restlessness and agitation: Secondary | ICD-10-CM

## 2015-08-20 DIAGNOSIS — T380X5A Adverse effect of glucocorticoids and synthetic analogues, initial encounter: Secondary | ICD-10-CM | POA: Diagnosis not present

## 2015-08-20 MED ORDER — LORAZEPAM 1 MG PO TABS
1.0000 mg | ORAL_TABLET | Freq: Once | ORAL | Status: AC
  Administered 2015-08-20: 1 mg via ORAL
  Filled 2015-08-20: qty 1

## 2015-08-20 NOTE — [facility] Suicide Risk Assessment (Signed)
Suicide Risk Assessment  Discharge Assessment   [facility]   Principal Problem: Prednisone adverse reaction Discharge Diagnoses:  Patient Active Problem List   Diagnosis Date Noted  . Prednisone adverse reaction [T38.0X5A] 08/20/2015    Priority: High  . Abdominal pain, chronic, epigastric [R10.13, G89.29] 06/30/2015  . Osteoarthritis of right foot [M19.071] 03/22/2015  . Type 2 diabetes mellitus with diabetic polyneuropathy, without long-term current use of insulin (HCC) [E11.42] 03/20/2015  . Morbid obesity (HCC) [E66.01] 06/13/2013  . Overactive bladder [N32.81] 06/13/2013  . Allergic rhinitis [J30.9] 06/13/2013  . Asthma, moderate persistent [J45.40] 06/13/2013  . BIPOLAR DISORDER UNSPECIFIED [F31.9] 03/18/2008  . Vaginal leukorrhea [N89.8] 10/05/2007  . HYPOKALEMIA [E87.6] 08/24/2007  . PERIPHERAL EDEMA [R60.9] 07/03/2007  . CONTUSION, RIGHT FOOT [S90.30XA] 04/23/2007  . GERD [K21.9] 01/30/2007  . HYPERCHOLESTEROLEMIA [E78.00] 12/07/2006  . Essential hypertension [I10] 12/07/2006  . ASTHMA [J45.909] 12/07/2006  . Osteoarthritis of right knee [M17.9] 12/07/2006  . ANEMIA, IRON DEFICIENCY [D50.9] 01/06/2006    Total Time spent with patient: 45 minutes  Musculoskeletal: Strength & Muscle Tone: within normal limits Gait & Station: normal Patient leans: N/A  Psychiatric Specialty Exam: Physical Exam  Constitutional: She is oriented to person, place, and time. She appears well-developed and well-nourished.  HENT:  Head: Normocephalic.  Neck: Normal range of motion.  Musculoskeletal: Normal range of motion.  Neurological: She is alert and oriented to person, place, and time.  Skin: Skin is warm and dry.  Psychiatric: Her speech is normal and behavior is normal. Judgment and thought content normal. Her mood appears anxious. Cognition and memory are normal.    Review of Systems  Constitutional: Negative.   HENT: Negative.   Eyes: Negative.    Respiratory: Negative.   Cardiovascular: Negative.   Gastrointestinal: Negative.   Genitourinary: Negative.   Musculoskeletal: Negative.   Skin: Negative.   Neurological: Negative.   Endo/Heme/Allergies: Negative.   Psychiatric/Behavioral: The patient is nervous/anxious.     Blood pressure 143/69, pulse 82, temperature 98 F (36.7 C), temperature source Oral, resp. rate 18, last menstrual period 05/30/2013, SpO2 98 %.There is no weight on file to calculate BMI.  General Appearance: Casual  Eye Contact:  Good  Speech:  Normal Rate  Volume:  Normal  Mood:  Irritable  Affect:  Congruent  Thought Process:  Coherent and Descriptions of Associations: Intact  Orientation:  Full (Time, Place, and Person)  Thought Content:  WDL  Suicidal Thoughts:  No  Homicidal Thoughts:  No  Memory:  Immediate;   Good Recent;   Good Remote;   Good  Judgement:  Fair  Insight:  Fair  Psychomotor Activity:  Normal  Concentration:  Concentration: Fair and Attention Span: Fair  Recall:  Fair  Fund of Knowledge:  Good  Language:  Good  Akathisia:  No  Handed:  Right  AIMS (if indicated):     Assets:  Housing Leisure Time Physical Health Resilience Social Support  ADL's:  Intact  Cognition:  WNL  Sleep:       Mental Status Per Nursing Assessment::   On Admission:   agitation  Demographic Factors:  Living alone  Loss Factors: NA  Historical Factors: NA  Risk Reduction Factors:   Sense of responsibility to family and Positive social support  Continued Clinical Symptoms:  Anxiety, mild  Cognitive Features That Contribute To Risk:  None    Suicide Risk:  Minimal: No identifiable suicidal ideation.  Patients presenting with no risk factors but with  morbid ruminations; may be classified as minimal risk based on the severity of the depressive symptoms    Plan Of Care/Follow-up recommendations:  Activity:  as tolerated Diet:  heart healthy diet  LORD, JAMISON, NP 08/20/2015,  11:28 AM

## 2015-08-20 NOTE — ED Notes (Signed)
Pt daughter got pt to return to triage room.  Pt in hall verbally aggressive with daughter, security ask pt to please discuss in her room.  Pt got loud with security and with any other staff that tried to redirect her.  Pt sts, " I need a bed where is there a bed"  Pt was informed that she would have to change into hospital scrubs in order to go to the back where there is a bed.  Pt refused but went back in triage room.

## 2015-08-20 NOTE — ED Notes (Signed)
Discharge instructions and follow up care reviewed with patient. Patient verbalized understanding. 

## 2015-08-20 NOTE — ED Notes (Signed)
TTS states that pt meets criteria for inpatient treatment and placement needs to be sought.

## 2015-08-20 NOTE — ED Notes (Signed)
Pt walked out of triage room complaining she was cold.  Pt sts "I have been her since 10pm, I am freezing, I am not going to catch pneumonia like I did last time."  Pt proceeds to go and sit in lobby.  Pt daughter and staff tried to redirect pt back to room without success.  Pt daughter remains in Triage Room.

## 2015-08-20 NOTE — ED Notes (Signed)
Pt's daughter Melany GuernseySchae Porrata [phone]

## 2015-08-20 NOTE — BH Assessment (Addendum)
Tele Assessment Note   Melissa Chavez is an 55 y.o. female presenting to ED accompanied by daughter who is concerned with observed bizarre and erratic behaviors. Pt states "I'm just tired. Just warn out" and insists that nothing is wrong outside of caregiver stress and difficulty adjusting to mother's dementia diagnosis. Pt denies any history of suicidal ideation, homicidal ideation and hallucination. Daughter attempted to share with clinician a time she believed mother to be hallucinating however, pt would not allow daughter to speak about the topic.   Pt expressed dissatisfaction with hospital throughout assessment and stated "they need to shut this hospital down". Pt also requested referral to "a good attorney" during assessment.   Pt reports history of one inpatient admission [facility] Chavez([facility]). Pt states admission was due to an "episode" that occurred "after I smoked a joint that was laced". Pt reports she was diagnosed with bipolar disorder at this times. Although pt does not agree with diagnosis, she attributes its onset to that event. Pt denies any additional substance use. Pt denies any use of THC since event. Pt is not currently followed by an outpatient provider and is not prescribed any medications for disorder.   Clinician contacted ED two times in attempts to speak with daughter separately from pt and gain collateral information. Clinician was informed both times that pt was attempting to leave and engaging in verbal altercation with daughter who wanted pt to remain in ED.  Diagnosis: F31.9 Bipolar I disorder, unspecified  Past Medical History:  Past Medical History  Diagnosis Date  . Hypertension   . Hernia   . Asthma   . Allergy   . Glaucoma   . Anemia   . Diabetes mellitus without complication (HCC)   . GERD (gastroesophageal reflux disease)   . Bipolar 2 disorder [facility](HCC)     Past Surgical History  Procedure Laterality Date  . Hernia repair    . Tubal ligation    .  Tonsillectomy      Family History:  Family History  Problem Relation Age of Onset  . Hypertension Mother   . Diabetes Mother   . Hyperlipidemia Mother   . Stroke Mother   . Heart disease Mother   . Pulmonary embolism Mother   . Hypertension Father   . Diabetes Father   . Hyperlipidemia Father     Social History:  reports that she has never smoked. She has never used smokeless tobacco. She reports that she does not drink alcohol or use illicit drugs.  Additional Social History:  Alcohol / Drug Use Pain Medications: No abuse reported Prescriptions: No abuse reported Over the Counter: No abuse reported History of alcohol / drug use?: No history of alcohol / drug abuse  CIWA: CIWA-Ar BP: (!) 147/119 mmHg Pulse Rate: (!) 20 COWS:    PATIENT STRENGTHS: (choose at least two) Average or above average intelligence Supportive family/friends  Allergies:  Allergies  Allergen Reactions  . Fish Allergy Anaphylaxis  . Banana Other (See Comments)    unknown    Home Medications:  (Not in a hospital admission)  OB/GYN Status:  Patient's last menstrual period was 05/30/2013.  General Assessment Data Location of Assessment: [facility] TTS Assessment: In system Is this a Tele or Face-to-Face Assessment?: Tele Assessment Is this an Initial Assessment or a Re-assessment for this encounter?: Initial Assessment Marital status: Single Is patient pregnant?: No Pregnancy Status: No Living Arrangements: Children Can pt return to current living arrangement?: Yes Admission Status: Voluntary Is patient  capable of signing voluntary admission?: Yes Referral Source: Self/Family/Friend Insurance type: [facility]     Crisis Care Plan Living Arrangements: Children Name of Psychiatrist: None Name of Therapist: None  Education Status Is patient currently in school?: No Highest grade of school patient has completed: Some College  Risk to self with the past 6 months Suicidal Ideation: No Has  patient been a risk to self within the past 6 months prior to admission? : No Suicidal Intent: No Has patient had any suicidal intent within the past 6 months prior to admission? : No Is patient at risk for suicide?: No Suicidal Plan?: No Has patient had any suicidal plan within the past 6 months prior to admission? : No Access to Means: No What has been your use of drugs/alcohol within the last 12 months?: none Reported Previous Attempts/Gestures: No Other Self Harm Risks: None Reported Intentional Self Injurious Behavior: None Family Suicide History: No Recent stressful life event(s): Other (Comment) (Mother has dementia) Persecutory voices/beliefs?: No Depression: Yes Depression Symptoms: Feeling angry/irritable, Tearfulness Substance abuse history and/or treatment for substance abuse?: No Suicide prevention information given to non-admitted patients: Yes  Risk to Others within the past 6 months Homicidal Ideation: No Does patient have any lifetime risk of violence toward others beyond the six months prior to admission? : No Thoughts of Harm to Others: No Current Homicidal Intent: No Current Homicidal Plan: No Access to Homicidal Means: No History of harm to others?: No Assessment of Violence: None Noted Does patient have access to weapons?: No Criminal Charges Pending?: No Does patient have a court date: No Is patient on probation?: No  Psychosis Hallucinations: None noted Delusions: None noted  Mental Status Report Appearance/Hygiene: Unremarkable Eye Contact: Good Motor Activity: Freedom of movement Speech: Other (Comment), Tangential (Circumstantial) Level of Consciousness: Alert Mood: Irritable, Depressed, Anxious Affect: Labile Anxiety Level: Moderate Thought Processes: Tangential, Circumstantial, Coherent Judgement: Unimpaired Orientation: Person, Situation, Time, Place Obsessive Compulsive Thoughts/Behaviors: None  Cognitive Functioning Concentration:  Decreased Memory: Recent Intact, Remote Intact IQ: Average Insight: Poor Impulse Control: Fair Appetite: Good Weight Loss: 0 Weight Gain: 0 Sleep: Decreased Total Hours of Sleep:  (Not Reported) Vegetative Symptoms: None  ADLScreening [facility] Assessment Services) Patient's cognitive ability adequate to safely complete daily activities?: Yes Patient able to express need for assistance with ADLs?: Yes Independently performs ADLs?: Yes (appropriate for developmental age)  Prior Inpatient Therapy Prior Inpatient Therapy: Yes Prior Therapy Dates: 1985 Prior Therapy Facilty/Provider(s): Melissa Chavez Reason for Treatment: pt believes she had an episode after smoking a "joint that was laced"  Prior Outpatient Therapy Prior Outpatient Therapy: No Does patient have an ACCT team?: No Does patient have Intensive In-House Services?  : No Does patient have [facility] services? : No Does patient have P4CC services?: No  ADL Screening (condition at time of admission) Patient's cognitive ability adequate to safely complete daily activities?: Yes Is the patient deaf or have difficulty hearing?: No Does the patient have difficulty seeing, even when wearing glasses/contacts?: No Does the patient have difficulty concentrating, remembering, or making decisions?: Yes Patient able to express need for assistance with ADLs?: Yes Does the patient have difficulty dressing or bathing?: No Independently performs ADLs?: Yes (appropriate for developmental age) Does the patient have difficulty walking or climbing stairs?: Yes (Right knee pain) Weakness of Legs: Right Weakness of Arms/Hands: None  Home Assistive Devices/Equipment Home Assistive Devices/Equipment: None  Therapy Consults (therapy consults require a physician order) PT Evaluation Needed: No OT Evalulation Needed: No SLP Evaluation  Needed: No Abuse/Neglect Assessment (Assessment to be complete while patient is alone) Physical Abuse:  Denies Verbal Abuse: Denies Sexual Abuse: Denies Exploitation of patient/patient's resources: Denies Self-Neglect: Denies Values / Beliefs Cultural Requests During Hospitalization: None Spiritual Requests During Hospitalization: None Consults Spiritual Care Consult Needed: No Social Work Consult Needed: No Merchant navy officer (For Healthcare) Does patient have an advance directive?: No Would patient like information on creating an advanced directive?: No - patient declined information    Additional Information 1:1 In Past 12 Months?: No CIRT Risk: No Elopement Risk: Yes Does patient have medical clearance?: No     Disposition: Per Donell Sievert, PA pt meets criteria for inpatient admission. TTS to seek placement. Jeanice Lim, RN notified of pt disposition.  Disposition Initial Assessment Completed for this Encounter: Yes Disposition of Patient: Other dispositions Other disposition(s): Other (Comment) (Pending Psychiatric Reccommendation)  Makaylen Thieme J [place] 08/20/2015 1:57 AM

## 2015-08-20 NOTE — ED Notes (Signed)
FNP at bedside.

## 2015-08-20 NOTE — ED Notes (Addendum)
Patient stating that she wants to leave. Jeraldine LootsLockwood, MD made aware. Called psych NP and made aware.

## 2015-08-20 NOTE — Consult Note (Signed)
[facility] Consult   Reason for Consult:  Agitation  Referring Physician:  EDP Patient Identification: Melissa Chavez MRN:  [id number] Principal Diagnosis: Prednisone adverse reaction Diagnosis:   Patient Active Problem List   Diagnosis Date Noted  . Prednisone adverse reaction [T38.0X5A] 08/20/2015    Priority: High  . Abdominal pain, chronic, epigastric [R10.13, G89.29] 06/30/2015  . Osteoarthritis of right foot [M19.071] 03/22/2015  . Type 2 diabetes mellitus with diabetic polyneuropathy, without long-term current use of insulin ([place]) [E11.42] 03/20/2015  . Morbid obesity ([place]) [E66.01] 06/13/2013  . Overactive bladder [N32.81] 06/13/2013  . Allergic rhinitis [J30.9] 06/13/2013  . Asthma, moderate persistent [J45.40] 06/13/2013  . BIPOLAR DISORDER UNSPECIFIED [F31.9] 03/18/2008  . Vaginal leukorrhea [N89.8] 10/05/2007  . HYPOKALEMIA [E87.6] 08/24/2007  . PERIPHERAL EDEMA [R60.9] 07/03/2007  . CONTUSION, RIGHT FOOT [S90.30XA] 04/23/2007  . GERD [K21.9] 01/30/2007  . HYPERCHOLESTEROLEMIA [E78.00] 12/07/2006  . Essential hypertension [I10] 12/07/2006  . ASTHMA [J45.909] 12/07/2006  . Osteoarthritis of right knee [M17.9] 12/07/2006  . ANEMIA, IRON DEFICIENCY [D50.9] 01/06/2006    Total Time spent with patient: 45 minutes  Subjective:   Melissa Chavez is a 55 y.o. female patient does not want admission.  HPI:  On admission:  55 y.o. female presenting to ED accompanied by daughter who is concerned with observed bizarre and erratic behaviors. Pt states "I'm just tired. Just warn out" and insists that nothing is wrong outside of caregiver stress and difficulty adjusting to mother's dementia diagnosis. Pt denies any history of suicidal ideation, homicidal ideation and hallucination. Daughter attempted to share with clinician a time she believed mother to be hallucinating however, pt would not allow daughter to speak about the topic.   Pt expressed dissatisfaction  with hospital throughout assessment and stated "they need to shut this hospital down". Pt also requested referral to "a good attorney" during assessment.   Pt reports history of one inpatient admission ([facility]). Pt states admission was due to an "episode" that occurred "after I smoked a joint that was laced". Pt reports she was diagnosed with bipolar disorder at this times. Although pt does not agree with diagnosis, she attributes its onset to that event. Pt denies any additional substance use. Pt denies any use of THC since event. Pt is not currently followed by an outpatient provider and is not prescribed any medications for disorder.   Clinician contacted ED two times in attempts to speak with daughter separately from pt and gain collateral information. Clinician was informed both times that pt was attempting to leave and engaging in verbal altercation with daughter who wanted pt to remain in ED.  Today:  The patient denies suicidal/homicidal ideations, hallucinations, and alcohol/drug abuse.   Prednisone reaction, patient is aware and her MD stopped it three days after beginning.  Her irritability will wane, stable for discharge.  Past Psychiatric History: none  Risk to Self: Suicidal Ideation: No Suicidal Intent: No Is patient at risk for suicide?: No Suicidal Plan?: No Access to Means: No What has been your use of drugs/alcohol within the last 12 months?: none Reported Other Self Harm Risks: None Reported Intentional Self Injurious Behavior: None Risk to Others: Homicidal Ideation: No Thoughts of Harm to Others: No Current Homicidal Intent: No Current Homicidal Plan: No Access to Homicidal Means: No History of harm to others?: No Assessment of Violence: None Noted Does patient have access to weapons?: No Criminal Charges Pending?: No Does patient have a court date: No Prior Inpatient Therapy:  Prior Inpatient Therapy: Yes Prior Therapy Dates: 1985 Prior Therapy  [facility]Provider([facility]): [facility] Reason for Treatment: pt believes she had an episode after smoking a "joint that was laced" Prior Outpatient Therapy: Prior Outpatient Therapy: No Does patient have an ACCT team?: No Does patient have Intensive In-House Services?  : No Does patient have [facility] services? : No Does patient have P4[facility] services?: No  Past Medical History:  Past Medical History  Diagnosis Date  . Hypertension   . Hernia   . Asthma   . Allergy   . Glaucoma   . Anemia   . Diabetes mellitus without complication ([place])   . GERD (gastroesophageal reflux disease)   . Bipolar 2 disorder [facility])     Past Surgical History  Procedure Laterality Date  . Hernia repair    . Tubal ligation    . Tonsillectomy     Family History:  Family History  Problem Relation Age of Onset  . Hypertension Mother   . Diabetes Mother   . Hyperlipidemia Mother   . Stroke Mother   . Heart disease Mother   . Pulmonary embolism Mother   . Hypertension Father   . Diabetes Father   . Hyperlipidemia Father    Family Psychiatric  History: none Social History:  History  Alcohol Use No     History  Drug Use No    Social History   Social History  . Marital Status: Single    Spouse Name: n/a  . Number of Children: 2  . Years of Education: Associates   Occupational History  . disabled     asthma   Social History Main Topics  . Smoking status: Never Smoker   . Smokeless tobacco: Never Used  . Alcohol Use: No  . Drug Use: No  . Sexual Activity: No   Other Topics Concern  . None   Social History Narrative   Lives with her daughter. Her other daughter lives on campus at college.   Additional Social History:    Allergies:   Allergies  Allergen Reactions  . Fish Allergy Anaphylaxis  . Banana Other (See Comments)    unknown    Labs:  Results for orders placed or performed during the hospital encounter of 08/19/15 (from the past 48 hour(s))  Comprehensive metabolic panel      Status: Abnormal   Collection Time: 08/19/15  9:52 PM  Result Value Ref Range   Sodium 139 135 - 145 mmol/L   Potassium 3.3 (L) 3.5 - 5.1 mmol/L   Chloride 109 101 - 111 mmol/L   CO2 23 22 - 32 mmol/L   Glucose, Bld 220 (H) 65 - 99 mg/dL   BUN 16 6 - 20 mg/dL   Creatinine, Ser 1.19 (H) 0.44 - 1.00 mg/dL   Calcium 8.5 (L) 8.9 - 10.3 mg/dL   Total Protein 6.8 6.5 - 8.1 g/dL   Albumin 3.5 3.5 - 5.0 g/dL   AST 16 15 - 41 U/L   ALT 15 14 - 54 U/L   Alkaline Phosphatase 74 38 - 126 U/L   Total Bilirubin 0.5 0.3 - 1.2 mg/dL   GFR calc non Af Amer 50 (L) >60 mL/min   GFR calc Af Amer 58 (L) >60 mL/min    Comment: (NOTE) The eGFR has been calculated using the CKD EPI equation. This calculation has not been validated in all clinical situations. eGFR's persistently <60 mL/min signify possible Chronic Kidney Disease.    Anion gap 7 5 - 15  cbc     Status: Abnormal   Collection Time: 08/19/15  9:52 PM  Result Value Ref Range   WBC 9.6 4.0 - 10.5 K/uL   RBC 3.65 (L) 3.87 - 5.11 MIL/uL   Hemoglobin 9.8 (L) 12.0 - 15.0 g/dL   HCT 28.8 (L) 36.0 - 46.0 %   MCV 78.9 78.0 - 100.0 fL   MCH 26.8 26.0 - 34.0 pg   MCHC 34.0 30.0 - 36.0 g/dL   RDW 17.3 (H) 11.5 - 15.5 %   Platelets 336 150 - 400 K/uL  Ethanol     Status: None   Collection Time: 08/19/15  9:53 PM  Result Value Ref Range   Alcohol, Ethyl (B) <5 <5 mg/dL    Comment:        LOWEST DETECTABLE LIMIT FOR SERUM ALCOHOL IS 5 mg/dL FOR MEDICAL PURPOSES ONLY   Salicylate level     Status: None   Collection Time: 08/19/15  9:53 PM  Result Value Ref Range   Salicylate Lvl <8.2 2.8 - 30.0 mg/dL  Acetaminophen level     Status: Abnormal   Collection Time: 08/19/15  9:53 PM  Result Value Ref Range   Acetaminophen (Tylenol), Serum <10 (L) 10 - 30 ug/mL    Comment:        THERAPEUTIC CONCENTRATIONS VARY SIGNIFICANTLY. A RANGE OF 10-30 ug/mL MAY BE AN EFFECTIVE CONCENTRATION FOR MANY PATIENTS. HOWEVER, SOME ARE BEST TREATED AT  CONCENTRATIONS OUTSIDE THIS RANGE. ACETAMINOPHEN CONCENTRATIONS >150 ug/mL AT 4 HOURS AFTER INGESTION AND >50 ug/mL AT 12 HOURS AFTER INGESTION ARE OFTEN ASSOCIATED WITH TOXIC REACTIONS.   Rapid urine drug screen (hospital performed)     Status: None   Collection Time: 08/19/15 10:56 PM  Result Value Ref Range   Opiates NONE DETECTED NONE DETECTED   Cocaine NONE DETECTED NONE DETECTED   Benzodiazepines NONE DETECTED NONE DETECTED   Amphetamines NONE DETECTED NONE DETECTED   Tetrahydrocannabinol NONE DETECTED NONE DETECTED   Barbiturates NONE DETECTED NONE DETECTED    Comment:        DRUG SCREEN FOR MEDICAL PURPOSES ONLY.  IF CONFIRMATION IS NEEDED FOR ANY PURPOSE, NOTIFY LAB WITHIN 5 DAYS.        LOWEST DETECTABLE LIMITS FOR URINE DRUG SCREEN Drug Class       Cutoff (ng/mL) Amphetamine      1000 Barbiturate      200 Benzodiazepine   505 Tricyclics       397 Opiates          300 Cocaine          300 THC              50     Current Facility-Administered Medications  Medication Dose Route Frequency Provider Last Rate Last Dose  . albuterol (PROVENTIL HFA;VENTOLIN HFA) 108 (90 Base) MCG/ACT inhaler 1 puff  1 puff Inhalation Q6H Junius Creamer, NP   1 puff at 08/20/15 0738  . fluticasone (FLONASE) 50 MCG/ACT nasal spray 1 spray  1 spray Each Nare Daily Junius Creamer, NP   1 spray at 08/20/15 0917  . ipratropium (ATROVENT HFA) inhaler 2 puff  2 puff Inhalation Q4H PRN Junius Creamer, NP      . loratadine (CLARITIN) tablet 10 mg  10 mg Oral Daily Junius Creamer, NP   10 mg at 08/20/15 0915  . meloxicam (MOBIC) tablet 15 mg  15 mg Oral Daily Junius Creamer, NP   15 mg at 08/20/15 0915  . metFORMIN (  GLUCOPHAGE) tablet 1,000 mg  1,000 mg Oral BID WC Junius Creamer, NP   1,000 mg at 08/20/15 0737  . mometasone-formoterol (DULERA) 200-5 MCG/ACT inhaler 2 puff  2 puff Inhalation BID Junius Creamer, NP   2 puff at 08/20/15 0739  . pravastatin (PRAVACHOL) tablet 40 mg  40 mg Oral Daily Junius Creamer, NP       . triamterene-hydrochlorothiazide (MAXZIDE-25) 37.5-25 MG per tablet 1 tablet  1 tablet Oral Daily Junius Creamer, NP   1 tablet at 08/20/15 6967   Current Outpatient Prescriptions  Medication Sig Dispense Refill  . ADVAIR DISKUS 250-50 MCG/DOSE AEPB Inhale 1 puff by mouth into the lungs two times daily 180 each 0  . cetirizine (ZYRTEC) 10 MG tablet Take 1 tablet (10 mg total) by mouth at bedtime. 90 tablet 3  . diclofenac sodium (VOLTAREN) 1 % GEL Apply 4 g topically 2 (two) times daily. 100 g 1  . ipratropium (ATROVENT HFA) 17 MCG/ACT inhaler Inhale 2 puffs into the lungs every 4 (four) hours as needed for wheezing. 1 Inhaler 0  . lisinopril (PRINIVIL,ZESTRIL) 40 MG tablet Take 1 tablet by mouth  daily 90 tablet 0  . meloxicam (MOBIC) 15 MG tablet Take 15 mg by mouth daily.     . metFORMIN (GLUCOPHAGE) 1000 MG tablet Take 1 tablet (1,000 mg total) by mouth 2 (two) times daily with a meal. 180 tablet 2  . oxybutynin (DITROPAN XL) 15 MG 24 hr tablet Take 1 tablet by mouth at  bedtime 90 tablet 0  . pravastatin (PRAVACHOL) 40 MG tablet Take 1 tablet by mouth  daily 90 tablet 0  . tiZANidine (ZANAFLEX) 4 MG tablet TAKE 1 TABLET (4 MG TOTAL) BY MOUTH AT BEDTIME. 90 tablet 0  . triamterene-hydrochlorothiazide (MAXZIDE-25) 37.5-25 MG tablet Take 1 tablet by mouth daily.     . VENTOLIN HFA 108 (90 Base) MCG/ACT inhaler Inhale 2 puffs by mouth  every 6 hours as needed for wheezing 54 g 0  . blood glucose meter kit and supplies KIT Use as directed 4 times a day.  DX: R73.09 1 each 0  . colchicine 0.6 MG tablet TAKE 2 TABLETS BY MOUTH NOW AND THEN 1 TABLET 1 HOUR LATER AS NEEDED FOR GOUT FLARE UP  0  . fluticasone (FLONASE) 50 MCG/ACT nasal spray PLACE 2 SPRAYS INTO BOTH NOSTRILS AT BEDTIME. (Patient not taking: Reported on 08/19/2015) 48 g 3  . glucose blood test strip Use as directed 4 times a day.  DX: R73.09 100 each 2  . HYDROcodone-acetaminophen (NORCO/VICODIN) 5-325 MG tablet Take 1-2 tablets by  mouth every 6 (six) hours as needed for moderate pain. (Patient not taking: Reported on 08/19/2015) 10 tablet 0  . Lancets MISC Use as directed 4 times a day.  DX: R73.09 100 each 2  . oxyCODONE-acetaminophen (ROXICET) 5-325 MG tablet Take 1-2 tablets by mouth every 4 (four) hours as needed for severe pain. (Patient not taking: Reported on 07/29/2015) 60 tablet 0    Musculoskeletal: Strength & Muscle Tone: within normal limits Gait & Station: normal Patient leans: N/A  Psychiatric Specialty Exam: Physical Exam  Constitutional: She is oriented to person, place, and time. She appears well-developed and well-nourished.  HENT:  Head: Normocephalic.  Neck: Normal range of motion.  Musculoskeletal: Normal range of motion.  Neurological: She is alert and oriented to person, place, and time.  Skin: Skin is warm and dry.  Psychiatric: Her speech is normal and behavior is normal. Judgment and thought  content normal. Her mood appears anxious. Cognition and memory are normal.    Review of Systems  Constitutional: Negative.   HENT: Negative.   Eyes: Negative.   Respiratory: Negative.   Cardiovascular: Negative.   Gastrointestinal: Negative.   Genitourinary: Negative.   Musculoskeletal: Negative.   Skin: Negative.   Neurological: Negative.   Endo/Heme/Allergies: Negative.   Psychiatric/Behavioral: The patient is nervous/anxious.     Blood pressure 143/69, pulse 82, temperature 98 F (36.7 C), temperature source Oral, resp. rate 18, last menstrual period 05/30/2013, SpO2 98 %.There is no weight on file to calculate BMI.  General Appearance: Casual  Eye Contact:  Good  Speech:  Normal Rate  Volume:  Normal  Mood:  Irritable  Affect:  Congruent  Thought Process:  Coherent and Descriptions of Associations: Intact  Orientation:  Full (Time, Place, and Person)  Thought Content:  WDL  Suicidal Thoughts:  No  Homicidal Thoughts:  No  Memory:  Immediate;   Good Recent;   Good Remote;   Good   Judgement:  Fair  Insight:  Fair  Psychomotor Activity:  Normal  Concentration:  Concentration: Fair and Attention Span: Fair  Recall:  [place] of Knowledge:  Good  Language:  Good  Akathisia:  No  Handed:  Right  AIMS (if indicated):     Assets:  Housing Leisure Time Physical Health Resilience Social Support  ADL's:  Intact  Cognition:  WNL  Sleep:        Treatment Plan Summary: Daily contact with patient to assess and evaluate symptoms and progress in treatment, Medication management and Plan prednisone reaction with behavioral changes: Prednison adverse reaction, subsequent visit -Crisis stabilization -Medication management:  Advised to refrain from prednisone use in the future, no new medications as symptoms will resolve after Prednisone clears her body -Individual counseling  Disposition: No evidence of imminent risk to self or others at present.    Waylan Boga, NP 08/20/2015 10:41 AM Patient seen face-to-face for psychiatric evaluation, chart reviewed and case discussed with the physician extender and developed treatment plan. Reviewed the information documented and agree with the treatment plan. Corena Pilgrim, MD

## 2015-08-20 NOTE — ED Notes (Signed)
Pt verbally aggressive with staff.  As Clinical research associatewriter was setting up telepsych machine, pt continually IT consultantinsulting staff.

## 2015-08-20 NOTE — ED Notes (Signed)
Daughter- [phone]

## 2015-08-20 NOTE — ED Notes (Signed)
Pt given a frozen dinner. 

## 2015-08-22 ENCOUNTER — Ambulatory Visit (INDEPENDENT_AMBULATORY_CARE_PROVIDER_SITE_OTHER): Payer: Medicare Other | Admitting: Family Medicine

## 2015-08-22 VITALS — BP 130/84 | HR 80 | Temp 97.7°F | Resp 16 | Ht 62.0 in | Wt 220.0 lb

## 2015-08-22 DIAGNOSIS — F319 Bipolar disorder, unspecified: Secondary | ICD-10-CM | POA: Diagnosis not present

## 2015-08-22 DIAGNOSIS — F309 Manic episode, unspecified: Secondary | ICD-10-CM

## 2015-08-22 MED ORDER — QUETIAPINE FUMARATE 300 MG PO TABS: 300.0000 mg | ORAL_TABLET | Freq: Every day | ORAL | Status: DC

## 2015-08-22 NOTE — Progress Notes (Signed)
Melissa Chavez is a 55 y.o. female who presents to [facility] today for bipolar disorder and acute mania. Patient was recently given a prescription for prednisone. This resulted in worsening mania symptoms. She notes she had difficulty sleeping but did not needed she increased her spending sociability and cleaning the house excessively. She was acting quite oddly according to her daughter. She was seen in the emergency department on June 15 where she was advised to discontinue prednisone. She refused admission to behavioral health but was not able to be involuntarily committed as she was not a danger to herself or others at the time. Since then she's improved a little bit but notes much more energetic than usual. Her daughter notes that her behavior has not yet returned to normal. She does not currently take any medications for bipolar disorder. She would like a referral to the psychiatrist that saw her in the hospital Dr. Darleene Cleaver.    Past Medical History  Diagnosis Date  . Hypertension   . Hernia   . Asthma   . Allergy   . Glaucoma   . Anemia   . Diabetes mellitus without complication ([place])   . GERD (gastroesophageal reflux disease)   . Bipolar 2 disorder [facility])    Past Surgical History  Procedure Laterality Date  . Hernia repair    . Tubal ligation    . Tonsillectomy     Social History  Substance Use Topics  . Smoking status: Never Smoker   . Smokeless tobacco: Never Used  . Alcohol Use: No   ROS as above Medications: Current Outpatient Prescriptions  Medication Sig Dispense Refill  . ADVAIR DISKUS 250-50 MCG/DOSE AEPB Inhale 1 puff by mouth into the lungs two times daily 180 each 0  . blood glucose meter kit and supplies KIT Use as directed 4 times a day.  DX: R73.09 1 each 0  . cetirizine (ZYRTEC) 10 MG tablet Take 1 tablet (10 mg total) by mouth at bedtime. 90 tablet 3  . colchicine 0.6 MG tablet TAKE 2 TABLETS BY MOUTH NOW AND THEN 1 TABLET 1 HOUR LATER AS NEEDED FOR  GOUT FLARE UP  0  . diclofenac sodium (VOLTAREN) 1 % GEL Apply 4 g topically 2 (two) times daily. 100 g 1  . fluticasone (FLONASE) 50 MCG/ACT nasal spray PLACE 2 SPRAYS INTO BOTH NOSTRILS AT BEDTIME. 48 g 3  . glucose blood test strip Use as directed 4 times a day.  DX: R73.09 100 each 2  . ipratropium (ATROVENT HFA) 17 MCG/ACT inhaler Inhale 2 puffs into the lungs every 4 (four) hours as needed for wheezing. 1 Inhaler 0  . Lancets MISC Use as directed 4 times a day.  DX: R73.09 100 each 2  . lisinopril (PRINIVIL,ZESTRIL) 40 MG tablet Take 1 tablet by mouth  daily 90 tablet 0  . meloxicam (MOBIC) 15 MG tablet Take 15 mg by mouth daily.     . metFORMIN (GLUCOPHAGE) 1000 MG tablet Take 1 tablet (1,000 mg total) by mouth 2 (two) times daily with a meal. 180 tablet 2  . oxybutynin (DITROPAN XL) 15 MG 24 hr tablet Take 1 tablet by mouth at  bedtime 90 tablet 0  . pravastatin (PRAVACHOL) 40 MG tablet Take 1 tablet by mouth  daily 90 tablet 0  . tiZANidine (ZANAFLEX) 4 MG tablet TAKE 1 TABLET (4 MG TOTAL) BY MOUTH AT BEDTIME. 90 tablet 0  . triamterene-hydrochlorothiazide (MAXZIDE-25) 37.5-25 MG tablet Take 1 tablet by mouth daily.     Marland Kitchen  VENTOLIN HFA 108 (90 Base) MCG/ACT inhaler Inhale 2 puffs by mouth  every 6 hours as needed for wheezing 54 g 0  . HYDROcodone-acetaminophen (NORCO/VICODIN) 5-325 MG tablet Take 1-2 tablets by mouth every 6 (six) hours as needed for moderate pain. (Patient not taking: Reported on 08/22/2015) 10 tablet 0  . oxyCODONE-acetaminophen (ROXICET) 5-325 MG tablet Take 1-2 tablets by mouth every 4 (four) hours as needed for severe pain. (Patient not taking: Reported on 08/22/2015) 60 tablet 0  . QUEtiapine (SEROQUEL) 300 MG tablet Take 1 tablet (300 mg total) by mouth at bedtime. 30 tablet 0   No current facility-administered medications for this visit.   Allergies  Allergen Reactions  . Fish Allergy Anaphylaxis  . Banana Other (See Comments)    unknown  . Prednisone Other  (See Comments)    Anxiety, erratic behavior, ?hallucinations Requiring ED and psychiatric evaluation     Exam:  BP 130/84 mmHg  Pulse 80  Temp(Src) 97.7 F (36.5 C) (Oral)  Resp 16  Ht '5\' 2"'  (1.575 m)  Wt 220 lb (99.791 kg)  BMI 40.23 kg/m2  SpO2 99%  LMP 05/30/2013 Gen: Well NAD Psych: AOx3 normal affect. Speech is a bit pressured. Thought process is somewhat tangential. No SI or HI expressed.  No results found for this or any previous visit (from the past 24 hour(s)). No results found.  Assessment and Plan: 55 y.o. female with acute mania and bipolar disorder. Plan to refer to psychiatry start Seroquel at night and follow-up with PCP in 1 week or less. Discussed precautions. Return sooner if needed.  Discussed warning signs or symptoms. Please see discharge instructions. Patient expresses understanding.

## 2015-08-22 NOTE — Patient Instructions (Addendum)
Thank you for coming in today. START Seroquel at night.  Return in 1 around week with Chelle You should hear from Dr Gloris ManchesterAkintayo's office soon.  STOP prednisone.   Bipolar Disorder Bipolar disorder is a mental illness. The term bipolar disorder actually is used to describe a group of disorders that all share varying degrees of emotional highs and lows that can interfere with daily functioning, such as work, school, or relationships. Bipolar disorder also can lead to drug abuse, hospitalization, and suicide. The emotional highs of bipolar disorder are periods of elation or irritability and high energy. These highs can range from a mild form (hypomania) to a severe form (mania). People experiencing episodes of hypomania may appear energetic, excitable, and highly productive. People experiencing mania may behave impulsively or erratically. They often make poor decisions. They may have difficulty sleeping. The most severe episodes of mania can involve having very distorted beliefs or perceptions about the world and seeing or hearing things that are not real (psychotic delusions and hallucinations).  The emotional lows of bipolar disorder (depression) also can range from mild to severe. Severe episodes of bipolar depression can involve psychotic delusions and hallucinations. Sometimes people with bipolar disorder experience a state of mixed mood. Symptoms of hypomania or mania and depression are both present during this mixed-mood episode. SIGNS AND SYMPTOMS There are signs and symptoms of the episodes of hypomania and mania as well as the episodes of depression. The signs and symptoms of hypomania and mania are similar but vary in severity. They include:  Inflated self-esteem or feeling of increased self-confidence.  Decreased need for sleep.  Unusual talkativeness (rapid or pressured speech) or the feeling of a need to keep talking.  Sensation of racing thoughts or constant talking, with quick shifts  between topics that may or may not be related (flight of ideas).  Decreased ability to focus or concentrate.  Increased purposeful activity, such as work, studies, or social activity, or nonproductive activity, such as pacing, squirming and fidgeting, or finger and toe tapping.  Impulsive behavior and use of poor judgment, resulting in high-risk activities, such as having unprotected sex or spending excessive amounts of money. Signs and symptoms of depression include the following:   Feelings of sadness, hopelessness, or helplessness.  Frequent or uncontrollable episodes of crying.  Lack of feeling anything or caring about anything.  Difficulty sleeping or sleeping too much.  Inability to enjoy the things you used to enjoy.   Desire to be alone all the time.   Feelings of guilt or worthlessness.  Lack of energy or motivation.   Difficulty concentrating, remembering, or making decisions.  Change in appetite or weight beyond normal fluctuations.  Thoughts of death or the desire to harm yourself. DIAGNOSIS  Bipolar disorder is diagnosed through an assessment by your caregiver. Your caregiver will ask questions about your emotional episodes. There are two main types of bipolar disorder. People with type I bipolar disorder have manic episodes with or without depressive episodes. People with type II bipolar disorder have hypomanic episodes and major depressive episodes, which are more serious than mild depression. The type of bipolar disorder you have can make an important difference in how your illness is monitored and treated. Your caregiver may ask questions about your medical history and use of alcohol or drugs, including prescription medication. Certain medical conditions and substances also can cause emotional highs and lows that resemble bipolar disorder (secondary bipolar disorder).  TREATMENT  Bipolar disorder is a long-term illness. It is  best controlled with continuous  treatment rather than treatment only when symptoms occur. The following treatments can be prescribed for bipolar disorders:  Medication--Medication can be prescribed by a doctor that is an expert in treating mental disorders (psychiatrists). Medications called mood stabilizers are usually prescribed to help control the illness. Other medications are sometimes added if symptoms of mania, depression, or psychotic delusions and hallucinations occur despite the use of a mood stabilizer.  Talk therapy--Some forms of talk therapy are helpful in providing support, education, and guidance. A combination of medication and talk therapy is best for managing the disorder over time. A procedure in which electricity is applied to your brain through your scalp (electroconvulsive therapy) is used in cases of severe mania when medication and talk therapy do not work or work too slowly.   This information is not intended to replace advice given to you by your health care provider. Make sure you discuss any questions you have with your health care provider.   Document Released: 05/30/2000 Document Revised: 03/14/2014 Document Reviewed: 03/19/2012 Elsevier Interactive Patient Education [employer]    IF you received an x-ray today, you will receive an invoice from [facility]. Please contact [facility] at [phone] with questions or concerns regarding your invoice.   IF you received labwork today, you will receive an invoice from [employer]. Please contact [facility] at [phone] with questions or concerns regarding your invoice.   Our billing staff will not be able to assist you with questions regarding bills from these companies.  You will be contacted with the lab results as soon as they are available. The fastest way to get your results is to activate your My Chart account. Instructions are located on the last page of this paperwork. If you have not heard  from [place] regarding the results in 2 weeks, please contact this office.

## 2015-08-23 ENCOUNTER — Telehealth: Payer: Self-pay | Admitting: Family Medicine

## 2015-08-23 DIAGNOSIS — F319 Bipolar disorder, unspecified: Secondary | ICD-10-CM | POA: Diagnosis not present

## 2015-08-23 NOTE — Telephone Encounter (Signed)
Evaluated on 08/22/15 by Dr. Denyse Amassorey.  Prescribed Seroquel for Prednisone-induced mania.  Took Seroquel 300mg  last night at 8:00pm; awoke this morning sedated, moderate to severe leg swelling, possible lip swelling.  Feels out of it. Denies numbness, tingling, focal weakness.  Denies chest pain or dyspnea.  A/P: possible facial swelling/angioedema: recommend ED evaluation.  Oversedation from Seroquel: recommend stopping Seroquel until further evaluation. If no evidence of allergic reaction/angioedema, consider lower dose of Seroquel for mania.  Pt to present to [facility] per her preference.

## 2015-08-24 ENCOUNTER — Ambulatory Visit (INDEPENDENT_AMBULATORY_CARE_PROVIDER_SITE_OTHER): Payer: Medicare Other | Admitting: Physician Assistant

## 2015-08-24 VITALS — BP 132/82 | HR 97 | Temp 98.0°F | Resp 18 | Ht 62.0 in | Wt 222.0 lb

## 2015-08-24 DIAGNOSIS — T380X5D Adverse effect of glucocorticoids and synthetic analogues, subsequent encounter: Secondary | ICD-10-CM | POA: Diagnosis not present

## 2015-08-24 DIAGNOSIS — F319 Bipolar disorder, unspecified: Secondary | ICD-10-CM | POA: Diagnosis not present

## 2015-08-24 DIAGNOSIS — L299 Pruritus, unspecified: Secondary | ICD-10-CM | POA: Diagnosis not present

## 2015-08-24 DIAGNOSIS — L819 Disorder of pigmentation, unspecified: Secondary | ICD-10-CM | POA: Diagnosis not present

## 2015-08-24 MED ORDER — HYDROXYZINE HCL 25 MG PO TABS
12.5000 mg | ORAL_TABLET | Freq: Three times a day (TID) | ORAL | Status: DC | PRN
Start: 2015-08-24 — End: 2015-08-31
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
## 2015-08-24 NOTE — Patient Instructions (Addendum)
Try to avoid caffeine. Do not drive at night, or any time you feel sleepy.     IF you received an x-ray today, you will receive an invoice from [facility]. Please contact [facility] at [phone] with questions or concerns regarding your invoice.   IF you received labwork today, you will receive an invoice from [facility]. Please contact [facility] at [phone] with questions or concerns regarding your invoice.   Our billing staff will not be able to assist you with questions regarding bills from these companies.  You will be contacted with the lab results as soon as they are available. The fastest way to get your results is to activate your My Chart account. Instructions are located on the last page of this paperwork. If you have not heard from us regarding the results in 2 weeks, please contact this office.

## 2015-08-31 ENCOUNTER — Ambulatory Visit (INDEPENDENT_AMBULATORY_CARE_PROVIDER_SITE_OTHER): Payer: Medicare Other | Admitting: Physician Assistant

## 2015-08-31 VITALS — BP 130/80 | HR 82 | Temp 98.2°F | Resp 16 | Ht 62.0 in | Wt 224.4 lb

## 2015-08-31 DIAGNOSIS — E1142 Type 2 diabetes mellitus with diabetic polyneuropathy: Secondary | ICD-10-CM | POA: Diagnosis not present

## 2015-08-31 DIAGNOSIS — R600 Localized edema: Secondary | ICD-10-CM | POA: Diagnosis not present

## 2015-08-31 DIAGNOSIS — F319 Bipolar disorder, unspecified: Secondary | ICD-10-CM | POA: Diagnosis not present

## 2015-08-31 LAB — COMPREHENSIVE METABOLIC PANEL
ALT: 12 U/L (ref 6–29)
AST: 14 U/L (ref 10–35)
Albumin: 3.8 g/dL (ref 3.6–5.1)
Alkaline Phosphatase: 97 U/L (ref 33–130)
BUN: 20 mg/dL (ref 7–25)
CO2: 27 mmol/L (ref 20–31)
Calcium: 9.2 mg/dL (ref 8.6–10.4)
Chloride: 105 mmol/L (ref 98–110)
Creat: 1.22 mg/dL — ABNORMAL HIGH (ref 0.50–1.05)
Glucose, Bld: 118 mg/dL — ABNORMAL HIGH (ref 65–99)
Potassium: 4.5 mmol/L (ref 3.5–5.3)
Sodium: 139 mmol/L (ref 135–146)
Total Bilirubin: 0.4 mg/dL (ref 0.2–1.2)
Total Protein: 7.1 g/dL (ref 6.1–8.1)

## 2015-08-31 LAB — POCT URINALYSIS DIP (MANUAL ENTRY)
Bilirubin, UA: NEGATIVE
Blood, UA: NEGATIVE
Glucose, UA: NEGATIVE
Ketones, POC UA: NEGATIVE
Nitrite, UA: NEGATIVE
Protein Ur, POC: NEGATIVE
Spec Grav, UA: 1.01
Urobilinogen, UA: 0.2
pH, UA: 6

## 2015-08-31 LAB — POC MICROSCOPIC URINALYSIS (UMFC): Mucus: ABSENT

## 2015-08-31 NOTE — Patient Instructions (Addendum)
Make sure that you are drinking plenty of water (64 ounces a day) and eliminate the sugary drinks.  Exercise will help, so [facility] exercises are great for your knee.  Just walking in the shallow end against the resistance for the water is great.  You can take another dosage of Hydroxzine when you wake during the night or take 1.5-2 tablets at bedtime for longer lasting effect.  Dr. Thedore MinsMojeed Akintayo [address], [place], [place] [address]  [phone]) [phone]    IF you received an x-ray today, you will receive an invoice from [facility]. Please contact [facility] at [phone] with questions or concerns regarding your invoice.   IF you received labwork today, you will receive an invoice from [facility]. Please contact [facility] at [phone] with questions or concerns regarding your invoice.   Our billing staff will not be able to assist you with questions regarding bills from these companies.  You will be contacted with the lab results as soon as they are available. The fastest way to get your results is to activate your My Chart account. Instructions are located on the last page of this paperwork. If you have not heard from us regarding the results in 2 weeks, please contact this office.     We recommend that you schedule a mammogram for breast cancer screening. Typically, you do not need a referral to do this. Please contact a local imaging center to schedule your mammogram.  [facility] - [phone]  *ask for the Radiology Department [facility]) - [phone] or [phone]  [facility] - [phone] [facility] - [phone] [facility] - [phone]) [phone]  *ask for the Radiology Department [facility] - [phone]  *ask for the Radiology Department [facility] - [phone]  *ask for the Mammography Department [facility] - [phone]) [phone]

## 2015-08-31 NOTE — Progress Notes (Signed)
Patient ID: Melissa Chavez, female    DOB: [birth year], 55 y.o.   MRN: [id number]  PCP: Harrison Mons, PA-C  Subjective:   Chief Complaint  Patient presents with  . Follow-up    per pt itching has gotten worse     HPI Presents for evaluation of itching, sleep disturbance, and bipolar disorder.  While she related that she was having worsening itching to our staff during triage, she reports to me that it's much better. She says that she did not realize the hydroxyzine would help sleep AND itching.  She is able to fall asleep, but awakens about 3 am. While it's doesn't really bother her at the time, and [facility], she feels sleepy the ret of the day.   She did receive a call from [facility] to schedule her outpatient visit, she did not call back, not realizing that the psychiatrist she desired to see was on staff there. She is concerned that her privacy will not be secure, as she knows several people who work there. She'd like to go elsewhere.  Her feet and legs have been swelling and she describes tingling in her feet. When asked if she's drinking much water, she notes that she probably isn't drinking enough, but she doesn't like having to urinate so frequently, and then mentions that she has a terribly dry mouth. Has been drinking more sweet tea and Gatorade lately. Expresses surprise when her glucose readings from the EMR (all in the 200's) are reviewed with her, "I had no idea they were so high," "No one told me."    Review of Systems  Respiratory: Negative for cough, choking and shortness of breath.   Cardiovascular: Positive for leg swelling. Negative for chest pain and palpitations.  Endocrine: Positive for polydipsia and polyuria.  Genitourinary: Positive for frequency. Negative for dysuria.  Musculoskeletal: Positive for arthralgias.  Skin: Negative.   Neurological: Negative for dizziness, light-headedness and headaches.  Psychiatric/Behavioral:  Positive for sleep disturbance. Negative for suicidal ideas, self-injury, dysphoric mood and decreased concentration. The patient is not nervous/anxious.        Patient Active Problem List   Diagnosis Date Noted  . Prednisone adverse reaction 08/20/2015  . Abdominal pain, chronic, epigastric 06/30/2015  . Osteoarthritis of right foot 03/22/2015  . Type 2 diabetes mellitus with diabetic polyneuropathy, without long-term current use of insulin ([place]) 03/20/2015  . BMI 40.0-44.9, adult ([place]) 06/13/2013  . Overactive bladder 06/13/2013  . Allergic rhinitis 06/13/2013  . Asthma, moderate persistent 06/13/2013  . BIPOLAR DISORDER UNSPECIFIED 03/18/2008  . PERIPHERAL EDEMA 07/03/2007  . GERD 01/30/2007  . HYPERCHOLESTEROLEMIA 12/07/2006  . Essential hypertension 12/07/2006  . Osteoarthritis of right knee 12/07/2006  . ANEMIA, IRON DEFICIENCY 01/06/2006     Prior to Admission medications   Medication Sig Start Date End Date Taking? Authorizing Provider  ADVAIR DISKUS 250-50 MCG/DOSE AEPB Inhale 1 puff by mouth into the lungs two times daily 07/01/15  Yes Shawnee Knapp, MD  blood glucose meter kit and supplies KIT Use as directed 4 times a day.  DX: R73.09 06/13/14  Yes Shawnee Knapp, MD  cetirizine (ZYRTEC) 10 MG tablet Take 1 tablet (10 mg total) by mouth at bedtime. 05/11/15  Yes Shawnee Knapp, MD  colchicine 0.6 MG tablet TAKE 2 TABLETS BY MOUTH NOW AND THEN 1 TABLET 1 HOUR LATER AS NEEDED FOR GOUT FLARE UP 07/05/15  Yes Historical Provider, MD  diclofenac sodium (VOLTAREN) 1 %  GEL Apply 4 g topically 2 (two) times daily. 06/30/15  Yes Darlyne Russian, MD  fluticasone (FLONASE) 50 MCG/ACT nasal spray PLACE 2 SPRAYS INTO BOTH NOSTRILS AT BEDTIME. 07/28/15  Yes Shawnee Knapp, MD  glucose blood test strip Use as directed 4 times a day.  DX: R73.09 06/13/14  Yes Shawnee Knapp, MD  ipratropium (ATROVENT HFA) 17 MCG/ACT inhaler Inhale 2 puffs into the lungs every 4 (four) hours as needed for wheezing. 08/05/15  Yes April  Palumbo, MD  Lancets MISC Use as directed 4 times a day.  DX: R73.09 06/13/14  Yes Shawnee Knapp, MD  lisinopril (PRINIVIL,ZESTRIL) 40 MG tablet Take 1 tablet by mouth  daily 07/01/15  Yes Shawnee Knapp, MD  meloxicam (MOBIC) 15 MG tablet Take 15 mg by mouth daily.  08/17/15  Yes Historical Provider, MD  metFORMIN (GLUCOPHAGE) 1000 MG tablet Take 1 tablet (1,000 mg total) by mouth 2 (two) times daily with a meal. 05/12/15  Yes Shawnee Knapp, MD  oxybutynin (DITROPAN XL) 15 MG 24 hr tablet Take 1 tablet by mouth at  bedtime 07/01/15  Yes Shawnee Knapp, MD  pravastatin (PRAVACHOL) 40 MG tablet Take 1 tablet by mouth  daily 07/01/15  Yes Shawnee Knapp, MD  tiZANidine (ZANAFLEX) 4 MG tablet TAKE 1 TABLET (4 MG TOTAL) BY MOUTH AT BEDTIME. 05/12/15  Yes Shawnee Knapp, MD  triamterene-hydrochlorothiazide (MAXZIDE-25) 37.5-25 MG tablet Take 1 tablet by mouth daily.  07/20/15  Yes Historical Provider, MD  VENTOLIN HFA 108 (90 Base) MCG/ACT inhaler Inhale 2 puffs by mouth  every 6 hours as needed for wheezing 07/25/15  Yes Shawnee Knapp, MD  hydrOXYzine (ATARAX/VISTARIL) 25 MG tablet  08/24/15   Historical Provider, MD     Allergies  Allergen Reactions  . Fish Allergy Anaphylaxis  . Banana Other (See Comments)    unknown  . Prednisone Other (See Comments)    Anxiety, erratic behavior, ?hallucinations Requiring ED and psychiatric evaluation       Objective:  Physical Exam  Constitutional: She is oriented to person, place, and time. She appears well-developed and well-nourished. She is active and cooperative. No distress.  BP 130/80 mmHg  Pulse 82  Temp(Src) 98.2 F (36.8 C) (Oral)  Resp 16  Ht 5' 2" (1.575 m)  Wt 224 lb 6.4 oz (101.787 kg)  BMI 41.03 kg/m2  SpO2 98%  LMP 05/30/2013  HENT:  Head: Normocephalic and atraumatic.  Right Ear: Hearing normal.  Left Ear: Hearing normal.  Eyes: Conjunctivae are normal. No scleral icterus.  Neck: Normal range of motion. Neck supple. No thyromegaly present.  Cardiovascular:  Normal rate, regular rhythm and normal heart sounds.   Pulses:      Radial pulses are 2+ on the right side, and 2+ on the left side.  2+ pitting edema of both lower extremities  Pulmonary/Chest: Effort normal and breath sounds normal.  Lymphadenopathy:       Head (right side): No tonsillar, no preauricular, no posterior auricular and no occipital adenopathy present.       Head (left side): No tonsillar, no preauricular, no posterior auricular and no occipital adenopathy present.    She has no cervical adenopathy.       Right: No supraclavicular adenopathy present.       Left: No supraclavicular adenopathy present.  Neurological: She is alert and oriented to person, place, and time. No sensory deficit.  Skin: Skin is warm, dry and intact. No rash  noted. No cyanosis or erythema. Nails show no clubbing.  Psychiatric: She has a normal mood and affect. Her speech is normal and behavior is normal.    Results for orders placed or performed in visit on 08/31/15  POCT urinalysis dipstick  Result Value Ref Range   Color, UA yellow yellow   Clarity, UA clear clear   Glucose, UA negative negative   Bilirubin, UA negative negative   Ketones, POC UA negative negative   Spec Grav, UA 1.010    Blood, UA negative negative   pH, UA 6.0    Protein Ur, POC negative negative   Urobilinogen, UA 0.2    Nitrite, UA Negative Negative   Leukocytes, UA small (1+) (A) Negative  POCT Microscopic Urinalysis ([facility])  Result Value Ref Range   WBC,UR,HPF,POC None None WBC/hpf   RBC,UR,HPF,POC None None RBC/hpf   Bacteria Few (A) None, Too numerous to count   Mucus Absent Absent   Epithelial Cells, UR Per Microscopy Many (A) None, Too numerous to count cells/hpf          Assessment & Plan:   1. Type 2 diabetes mellitus with diabetic polyneuropathy, without long-term current use of insulin (HCC) I'm concerned that she's not following a healthy diet, though there are no ketones in the urine and no glucose  in the urine.  - POCT urinalysis dipstick - POCT Microscopic Urinalysis ([facility]) - Comprehensive metabolic panel  2. BIPOLAR DISORDER UNSPECIFIED Explained again the need for psychiatric follow-up and treatment. Provided the contact information for Dr. Darleene Cleaver. She will schedule a follow-up appointment.  3. Bilateral edema of lower extremity Await CMET. Increase hydration. Eliminate sugary drinks. Elevate. Exercise.   Fara Chute, PA-C Physician Assistant-Certified [facility]

## 2015-08-31 NOTE — Progress Notes (Signed)
Subjective:    Patient ID: Melissa Chavez, female    DOB: [birth year], 55 y.o.   MRN: [id number]  Chief Complaint  Patient presents with  . Follow-up    per pt itching has gotten worse    HPI  Patient is a 55 yo female who presents today for follow up of Bipolar disorder and puritis.    Pruritis improved, now only associated with dry skin.  Hydroxyzine, not helping with sleep, still waking at 3am daily.  Someone from behavioral health called, but she did not make an appointment because she does not like [facility].. Did not schedule appointment with Dr. Darleene Cleaver because she did not want to be seen at [facility] due to acquaintances working there.  Family stress is ongoing, continuing to "butt heads with her mother>"  She reports leg pain and swelling in her feet and ankles - tries to elevate at night in recliner, compliant with BP meds.  Tried a sauna, which helped with pain in legs.   Going to talk to orthopedist about knee surgery when he returns.  Admits to sinus headache x 3-4days, taking Claritin with some improvement.  Has not been contacted by dermatology yet.    Review of Systems  Constitutional: Negative for fever and activity change.  HENT: Positive for sinus pressure. Negative for congestion.   Eyes: Negative for photophobia and visual disturbance.  Respiratory: Negative for cough and shortness of breath.   Cardiovascular: Positive for leg swelling. Negative for chest pain.  Gastrointestinal: Negative for nausea, vomiting, abdominal pain, diarrhea and abdominal distention.  Endocrine: Positive for cold intolerance, polydipsia and polyuria.  Genitourinary: Positive for urgency and frequency.       Patient Active Problem List   Diagnosis Date Noted  . Prednisone adverse reaction 08/20/2015  . Abdominal pain, chronic, epigastric 06/30/2015  . Osteoarthritis of right foot 03/22/2015  . Type 2 diabetes mellitus with diabetic polyneuropathy, without long-term current use of  insulin ([place]) 03/20/2015  . BMI 40.0-44.9, adult ([place]) 06/13/2013  . Overactive bladder 06/13/2013  . Allergic rhinitis 06/13/2013  . Asthma, moderate persistent 06/13/2013  . BIPOLAR DISORDER UNSPECIFIED 03/18/2008  . PERIPHERAL EDEMA 07/03/2007  . GERD 01/30/2007  . HYPERCHOLESTEROLEMIA 12/07/2006  . Essential hypertension 12/07/2006  . Osteoarthritis of right knee 12/07/2006  . ANEMIA, IRON DEFICIENCY 01/06/2006    Current Outpatient Prescriptions on File Prior to Visit  Medication Sig Dispense Refill  . ADVAIR DISKUS 250-50 MCG/DOSE AEPB Inhale 1 puff by mouth into the lungs two times daily 180 each 0  . blood glucose meter kit and supplies KIT Use as directed 4 times a day.  DX: R73.09 1 each 0  . cetirizine (ZYRTEC) 10 MG tablet Take 1 tablet (10 mg total) by mouth at bedtime. 90 tablet 3  . colchicine 0.6 MG tablet TAKE 2 TABLETS BY MOUTH NOW AND THEN 1 TABLET 1 HOUR LATER AS NEEDED FOR GOUT FLARE UP  0  . diclofenac sodium (VOLTAREN) 1 % GEL Apply 4 g topically 2 (two) times daily. 100 g 1  . fluticasone (FLONASE) 50 MCG/ACT nasal spray PLACE 2 SPRAYS INTO BOTH NOSTRILS AT BEDTIME. 48 g 3  . glucose blood test strip Use as directed 4 times a day.  DX: R73.09 100 each 2  . ipratropium (ATROVENT HFA) 17 MCG/ACT inhaler Inhale 2 puffs into the lungs every 4 (four) hours as needed for wheezing. 1 Inhaler 0  . Lancets MISC Use as directed 4 times a day.  DX: R73.09 100 each 2  . lisinopril (PRINIVIL,ZESTRIL) 40 MG tablet Take 1 tablet by mouth  daily 90 tablet 0  . meloxicam (MOBIC) 15 MG tablet Take 15 mg by mouth daily.     . metFORMIN (GLUCOPHAGE) 1000 MG tablet Take 1 tablet (1,000 mg total) by mouth 2 (two) times daily with a meal. 180 tablet 2  . oxybutynin (DITROPAN XL) 15 MG 24 hr tablet Take 1 tablet by mouth at  bedtime 90 tablet 0  . pravastatin (PRAVACHOL) 40 MG tablet Take 1 tablet by mouth  daily 90 tablet 0  . tiZANidine (ZANAFLEX) 4 MG tablet TAKE 1 TABLET (4 MG  TOTAL) BY MOUTH AT BEDTIME. 90 tablet 0  . triamterene-hydrochlorothiazide (MAXZIDE-25) 37.5-25 MG tablet Take 1 tablet by mouth daily.     . VENTOLIN HFA 108 (90 Base) MCG/ACT inhaler Inhale 2 puffs by mouth  every 6 hours as needed for wheezing 54 g 0   No current facility-administered medications on file prior to visit.    Allergies  Allergen Reactions  . Fish Allergy Anaphylaxis  . Banana Other (See Comments)    unknown  . Prednisone Other (See Comments)    Anxiety, erratic behavior, ?hallucinations Requiring ED and psychiatric evaluation     Objective: BP 130/80 mmHg  Pulse 82  Temp(Src) 98.2 F (36.8 C) (Oral)  Resp 16  Ht 5' 2"  (1.575 m)  Wt 224 lb 6.4 oz (101.787 kg)  BMI 41.03 kg/m2  SpO2 98%  LMP 05/30/2013   Physical Exam  Constitutional: She is oriented to person, place, and time. She appears well-developed and well-nourished.  Neck: Normal range of motion. Neck supple. No thyromegaly present.  Cardiovascular: Normal rate, regular rhythm, normal heart sounds and intact distal pulses.  Exam reveals no gallop and no friction rub.   No murmur heard. Pulses:      Radial pulses are 2+ on the right side, and 2+ on the left side.       Dorsalis pedis pulses are 2+ on the right side, and 2+ on the left side.       Posterior tibial pulses are 1+ on the right side, and 1+ on the left side.  Pulmonary/Chest: Effort normal and breath sounds normal. No respiratory distress. She exhibits no tenderness.  Musculoskeletal:  2+ bilateral pitting edema distal to knee through ankle  Neurological: She is alert and oriented to person, place, and time. She has normal strength.  Reflex Scores:      Patellar reflexes are 1+ on the right side and 1+ on the left side. Skin: Skin is warm and dry.  Psychiatric: She has a normal mood and affect. Her speech is normal and behavior is normal. Thought content normal.  +flight of ideas, distractibility, talkativeness, disrupted  sleep -grandiosity,  She is attentive.       Assessment & Plan:  1. Type 2 diabetes mellitus with diabetic polyneuropathy, without long-term current use of insulin (HCC) Urinalysis within normal limits. CMP pending, will adjust medication regimen if indicated. Encouraged patient to stop drinking sugary drinks, and continue to exercise.   - POCT urinalysis dipstick - POCT Microscopic Urinalysis ([facility]) - Comprehensive metabolic panel  2. BIPOLAR DISORDER UNSPECIFIED Provided information for Dr. Darleene Cleaver private practice.  Recommended she contact this office and schedule an appointment for further evaluation.  3. Bilateral edema of lower extremity Patient instructed to drink at least 64oz of water daily, continue to do water exercise and elevate her feet when swelling increases.  Patient instructed she can take an additional dosage of hydroxizine when she wakes during the night or take 1.5-2 55m tablets at bedtime for a longer lasting effect.    Patient is scheduled to return on 09/15/15.  If any concerns or complaints arise before that time call or return to clinic.    Davarious Tumbleson P. Kenlei Safi, PA-S

## 2015-09-03 ENCOUNTER — Ambulatory Visit: Payer: Medicare Other | Admitting: Family Medicine

## 2015-09-07 ENCOUNTER — Encounter: Payer: Self-pay | Admitting: Physician Assistant

## 2015-09-07 DIAGNOSIS — M19029 Primary osteoarthritis, unspecified elbow: Secondary | ICD-10-CM | POA: Insufficient documentation

## 2015-09-11 ENCOUNTER — Other Ambulatory Visit: Payer: Self-pay | Admitting: Family Medicine

## 2015-09-14 NOTE — Telephone Encounter (Signed)
Dr Clelia CroftShaw, you Rxd colchicine for pt in April, but didn't give any RFs. Do you want to, or does pt need to RTC if she has another flare up?

## 2015-09-14 NOTE — Telephone Encounter (Signed)
Meds ordered this encounter  Medications  . colchicine 0.6 MG tablet    Sig: Take 2 tablets with onset of gout, then 1 tablet every 12 hours until gout flare resolves    Dispense:  30 tablet    Refill:  0

## 2015-09-14 NOTE — Telephone Encounter (Signed)
Chelle has been seeing this pt recently so I am going to defer this to her.  Thank you Chelle

## 2015-09-15 ENCOUNTER — Ambulatory Visit: Payer: Medicare Other | Admitting: Physician Assistant

## 2015-09-17 ENCOUNTER — Encounter (HOSPITAL_COMMUNITY): Payer: Self-pay | Admitting: *Deleted

## 2015-09-17 DIAGNOSIS — I10 Essential (primary) hypertension: Secondary | ICD-10-CM | POA: Insufficient documentation

## 2015-09-17 DIAGNOSIS — Z79899 Other long term (current) drug therapy: Secondary | ICD-10-CM | POA: Insufficient documentation

## 2015-09-17 DIAGNOSIS — R2243 Localized swelling, mass and lump, lower limb, bilateral: Secondary | ICD-10-CM | POA: Diagnosis not present

## 2015-09-17 DIAGNOSIS — J45909 Unspecified asthma, uncomplicated: Secondary | ICD-10-CM | POA: Insufficient documentation

## 2015-09-17 DIAGNOSIS — Z7984 Long term (current) use of oral hypoglycemic drugs: Secondary | ICD-10-CM | POA: Diagnosis not present

## 2015-09-17 DIAGNOSIS — R600 Localized edema: Secondary | ICD-10-CM | POA: Diagnosis not present

## 2015-09-17 DIAGNOSIS — E119 Type 2 diabetes mellitus without complications: Secondary | ICD-10-CM | POA: Diagnosis not present

## 2015-09-17 DIAGNOSIS — M25522 Pain in left elbow: Secondary | ICD-10-CM | POA: Diagnosis not present

## 2015-09-17 NOTE — ED Notes (Signed)
Pt states she has arthritis in her left elbow and tonight her daughter bent her elbow back and it has been hurting since. Pt able to moved all fingers. States that she does have pain in her fingers that shoots to her elbow when she moves them.

## 2015-09-17 NOTE — ED Notes (Signed)
Pt also c/o lower extremity swelling x1 month. Pt states she is concerned she has MS.

## 2015-09-18 ENCOUNTER — Encounter (HOSPITAL_COMMUNITY): Payer: Self-pay | Admitting: Emergency Medicine

## 2015-09-18 ENCOUNTER — Emergency Department (HOSPITAL_COMMUNITY): Payer: Medicare Other

## 2015-09-18 ENCOUNTER — Emergency Department (HOSPITAL_COMMUNITY)
Admission: EM | Admit: 2015-09-18 | Discharge: 2015-09-18 | Disposition: A | Discharge status: Home / Self Care | Payer: Medicare Other | Attending: Emergency Medicine | Admitting: Emergency Medicine

## 2015-09-18 DIAGNOSIS — R600 Localized edema: Secondary | ICD-10-CM

## 2015-09-18 DIAGNOSIS — M25522 Pain in left elbow: Secondary | ICD-10-CM

## 2015-09-18 NOTE — Discharge Instructions (Signed)
Continue taking the Mobic for your elbow pain.  Return if you develop redness or swelling of the elbow or a fever above 101.   It is recommended that you wear compression stockings for the swelling of your legs and elevate your feet above the level of your heart  Edema Edema is an abnormal buildup of fluids in your bodytissues. Edema is somewhatdependent on gravity to pull the fluid to the lowest place in your body. That makes the condition more common in the legs and thighs (lower extremities). Painless swelling of the feet and ankles is common and becomes more likely as you get older. It is also common in looser tissues, like around your eyes.  When the affected area is squeezed, the fluid may move out of that spot and leave a dent for a few moments. This dent is called pitting.  CAUSES  There are many possible causes of edema. Eating too much salt and being on your feet or sitting for a long time can cause edema in your legs and ankles. Hot weather may make edema worse. Common medical causes of edema include:  Heart failure.  Liver disease.  Kidney disease.  Weak blood vessels in your legs.  Cancer.  An injury.  Pregnancy.  Some medications.  Obesity. SYMPTOMS  Edema is usually painless.Your skin may look swollen or shiny.  DIAGNOSIS  Your health care provider may be able to diagnose edema by asking about your medical history and doing a physical exam. You may need to have tests such as X-rays, an electrocardiogram, or blood tests to check for medical conditions that may cause edema.  TREATMENT  Edema treatment depends on the cause. If you have heart, liver, or kidney disease, you need the treatment appropriate for these conditions. General treatment may include:  Elevation of the affected body part above the level of your heart.  Compression of the affected body part. Pressure from elastic bandages or support stockings squeezes the tissues and forces fluid back into the  blood vessels. This keeps fluid from entering the tissues.  Restriction of fluid and salt intake.  Use of a water pill (diuretic). These medications are appropriate only for some types of edema. They pull fluid out of your body and make you urinate more often. This gets rid of fluid and reduces swelling, but diuretics can have side effects. Only use diuretics as directed by your health care provider. HOME CARE INSTRUCTIONS   Keep the affected body part above the level of your heart when you are lying down.   Do not sit still or stand for prolonged periods.   Do not put anything directly under your knees when lying down.  Do not wear constricting clothing or garters on your upper legs.   Exercise your legs to work the fluid back into your blood vessels. This may help the swelling go down.   Wear elastic bandages or support stockings to reduce ankle swelling as directed by your health care provider.   Eat a low-salt diet to reduce fluid if your health care provider recommends it.   Only take medicines as directed by your health care provider. SEEK MEDICAL CARE IF:   Your edema is not responding to treatment.  You have heart, liver, or kidney disease and notice symptoms of edema.  You have edema in your legs that does not improve after elevating them.   You have sudden and unexplained weight gain. SEEK IMMEDIATE MEDICAL CARE IF:   You develop shortness of  breath or chest pain.   You cannot breathe when you lie down.  You develop pain, redness, or warmth in the swollen areas.   You have heart, liver, or kidney disease and suddenly get edema.  You have a fever and your symptoms suddenly get worse. MAKE SURE YOU:   Understand these instructions.  Will watch your condition.  Will get help right away if you are not doing well or get worse.   This information is not intended to replace advice given to you by your health care provider. Make sure you discuss any  questions you have with your health care provider.   Document Released: 02/21/2005 Document Revised: 03/14/2014 Document Reviewed: 12/14/2012 Elsevier Interactive Patient Education [employer]2016 [employer]

## 2015-09-18 NOTE — ED Provider Notes (Signed)
CSN: [id number]     Arrival date & time 09/17/15  2343 History   First MD Initiated Contact with Patient 09/18/15 0303     Chief Complaint  Patient presents with  . Elbow Pain  . Leg Swelling     (Consider location/radiation/quality/duration/timing/severity/associated sxs/prior Treatment) HPI Comments: Patient presents today with complaints of left elbow pain and bilateral LE edema.  She reports that both of her symptoms have been present for the past 3 months.  She states that the elbow pain is worse with movement and that she feels "popping" when moving her elbow.  She is taking Mobic for the pain, which is helping.  No acute injury or trauma to the elbow.  She states that she has seen an Orthopedist for the pain and was told that it was arthritis.  No fever, chills, weakness, numbness, or tingling  She is also complaining of swelling of both lower legs.  Swelling worsens when she is on her feet all day.  She reports that the swelling is minimal in the morning when she first wakes up.  Denies any pain of the legs.  Denies any CP or SOB.  Denies any prolonged travel or surgeries in the past 4 weeks.   The history is provided by the patient.    Past Medical History  Diagnosis Date  . Hypertension   . Hernia   . Asthma   . Allergy   . Glaucoma   . Anemia   . Diabetes mellitus without complication ([place])   . GERD (gastroesophageal reflux disease)   . Bipolar 2 disorder [facility])    Past Surgical History  Procedure Laterality Date  . Hernia repair    . Tubal ligation    . Tonsillectomy     Family History  Problem Relation Age of Onset  . Hypertension Mother   . Diabetes Mother   . Hyperlipidemia Mother   . Stroke Mother   . Heart disease Mother   . Pulmonary embolism Mother   . Hypertension Father   . Diabetes Father   . Hyperlipidemia Father    Social History  Substance Use Topics  . Smoking status: Never Smoker   . Smokeless tobacco: Never Used  . Alcohol Use: No   OB  History    No data available     Review of Systems  All other systems reviewed and are negative.     Allergies  Fish allergy; Banana; and Prednisone  Home Medications   Prior to Admission medications   Medication Sig Start Date End Date Taking? Authorizing Provider  ADVAIR DISKUS 250-50 MCG/DOSE AEPB Inhale 1 puff by mouth into the lungs two times daily 07/01/15   Shawnee Knapp, MD  blood glucose meter kit and supplies KIT Use as directed 4 times a day.  DX: R73.09 06/13/14   Shawnee Knapp, MD  cetirizine (ZYRTEC) 10 MG tablet Take 1 tablet (10 mg total) by mouth at bedtime. 05/11/15   Shawnee Knapp, MD  colchicine 0.6 MG tablet TAKE 2 TABLETS BY MOUTH NOW AND THEN 1 TABLET 1 HOUR LATER AS NEEDED FOR GOUT FLARE UP 07/05/15   Historical Provider, MD  colchicine 0.6 MG tablet Take 2 tablets with onset of gout, then 1 tablet every 12 hours until gout flare resolves 09/14/15   Chelle Jeffery, PA-C  diclofenac sodium (VOLTAREN) 1 % GEL Apply 4 g topically 2 (two) times daily. 06/30/15   Darlyne Russian, MD  fluticasone (FLONASE) 50 MCG/ACT nasal spray  PLACE 2 SPRAYS INTO BOTH NOSTRILS AT BEDTIME. 07/28/15   Shawnee Knapp, MD  glucose blood test strip Use as directed 4 times a day.  DX: R73.09 06/13/14   Shawnee Knapp, MD  hydrOXYzine (ATARAX/VISTARIL) 25 MG tablet  08/24/15   Historical Provider, MD  ipratropium (ATROVENT HFA) 17 MCG/ACT inhaler Inhale 2 puffs into the lungs every 4 (four) hours as needed for wheezing. 08/05/15   April Palumbo, MD  Lancets MISC Use as directed 4 times a day.  DX: R73.09 06/13/14   Shawnee Knapp, MD  lisinopril (PRINIVIL,ZESTRIL) 40 MG tablet Take 1 tablet by mouth  daily 07/01/15   Shawnee Knapp, MD  meloxicam (MOBIC) 15 MG tablet Take 15 mg by mouth daily.  08/17/15   Historical Provider, MD  metFORMIN (GLUCOPHAGE) 1000 MG tablet Take 1 tablet (1,000 mg total) by mouth 2 (two) times daily with a meal. 05/12/15   Shawnee Knapp, MD  oxybutynin (DITROPAN XL) 15 MG 24 hr tablet Take 1 tablet by mouth at   bedtime 07/01/15   Shawnee Knapp, MD  pravastatin (PRAVACHOL) 40 MG tablet Take 1 tablet by mouth  daily 07/01/15   Shawnee Knapp, MD  tiZANidine (ZANAFLEX) 4 MG tablet TAKE 1 TABLET (4 MG TOTAL) BY MOUTH AT BEDTIME. 05/12/15   Shawnee Knapp, MD  triamterene-hydrochlorothiazide (MAXZIDE-25) 37.5-25 MG tablet Take 1 tablet by mouth daily.  07/20/15   Historical Provider, MD  VENTOLIN HFA 108 (90 Base) MCG/ACT inhaler Inhale 2 puffs by mouth  every 6 hours as needed for wheezing 07/25/15   Shawnee Knapp, MD   BP 134/65 mmHg  Pulse 63  Temp(Src) 97.7 F (36.5 C) (Oral)  Resp 16  SpO2 100%  LMP 05/30/2013 Physical Exam  Constitutional: She appears well-developed and well-nourished.  HENT:  Head: Normocephalic and atraumatic.  Neck: Normal range of motion. Neck supple.  Cardiovascular: Normal rate, regular rhythm and normal heart sounds.   Pulses:      Radial pulses are 2+ on the right side, and 2+ on the left side.       Dorsalis pedis pulses are 2+ on the right side, and 2+ on the left side.  Pulmonary/Chest: Effort normal and breath sounds normal.  Musculoskeletal:       Left elbow: She exhibits normal range of motion, no swelling and no effusion. Tenderness found.  No erythema, edema, or warmth of the left elbow 1+ pitting edema of LE bilaterally.  NO erythema or warmth of the LE  Neurological: She is alert.  Skin: Skin is warm and dry.  Psychiatric: She has a normal mood and affect.  Nursing note and vitals reviewed.   ED Course  Procedures (including critical care time) Labs Review Labs Reviewed - No data to display  Imaging Review Dg Elbow Complete Left  09/18/2015  CLINICAL DATA:  Left elbow pain after injury today. EXAM: LEFT ELBOW - COMPLETE 3+ VIEW COMPARISON:  Left forearm 10/19/2007 FINDINGS: Degenerative changes in the left elbow with hypertrophic osteophytes in the radial and ulnar joints. Spurring on the coronoid process. No evidence of acute fracture or dislocation. No significant  effusion. Soft tissues are unremarkable. IMPRESSION: Degenerative changes in the left elbow. No acute bony abnormalities. Electronically Signed   By: Lucienne Capers M.D.   On: 09/18/2015 00:41   I have personally reviewed and evaluated these images and lab results as part of my medical decision-making.   EKG Interpretation None  MDM   Final diagnoses:  None   Patient presents today with left elbow pain.  No signs of infection.  No acute injury or trauma.  Xray showing degenerative changes.  Neurovascularly intact.  Stable for discharge.  Return precautions given.      Hyman Bible, PA-C 09/19/15 1836  Orpah Greek, MD 09/21/15 [phone]

## 2015-09-24 ENCOUNTER — Encounter: Payer: Self-pay | Admitting: Physician Assistant

## 2015-09-25 ENCOUNTER — Ambulatory Visit (INDEPENDENT_AMBULATORY_CARE_PROVIDER_SITE_OTHER): Payer: Medicare Other | Admitting: Physician Assistant

## 2015-09-25 VITALS — BP 172/90 | HR 93 | Temp 98.3°F | Resp 16 | Ht 62.0 in | Wt 221.0 lb

## 2015-09-25 DIAGNOSIS — E1142 Type 2 diabetes mellitus with diabetic polyneuropathy: Secondary | ICD-10-CM | POA: Diagnosis not present

## 2015-09-25 DIAGNOSIS — F319 Bipolar disorder, unspecified: Secondary | ICD-10-CM | POA: Diagnosis not present

## 2015-09-25 DIAGNOSIS — R600 Localized edema: Secondary | ICD-10-CM | POA: Diagnosis not present

## 2015-09-25 DIAGNOSIS — I10 Essential (primary) hypertension: Secondary | ICD-10-CM | POA: Diagnosis not present

## 2015-09-25 DIAGNOSIS — D509 Iron deficiency anemia, unspecified: Secondary | ICD-10-CM

## 2015-09-25 DIAGNOSIS — R1012 Left upper quadrant pain: Secondary | ICD-10-CM

## 2015-09-25 LAB — CBC WITH DIFFERENTIAL/PLATELET
Basophils Absolute: 0 cells/uL (ref 0–200)
Basophils Relative: 0 %
Eosinophils Absolute: 444 cells/uL (ref 15–500)
Eosinophils Relative: 4 %
HCT: 33.3 % — ABNORMAL LOW (ref 35.0–45.0)
Hemoglobin: 11 g/dL — ABNORMAL LOW (ref 11.7–15.5)
Lymphocytes Relative: 30 %
Lymphs Abs: 3330 cells/uL (ref 850–3900)
MCH: 26.6 pg — ABNORMAL LOW (ref 27.0–33.0)
MCHC: 33 g/dL (ref 32.0–36.0)
MCV: 80.6 fL (ref 80.0–100.0)
MPV: 9.6 fL (ref 7.5–12.5)
Monocytes Absolute: 666 cells/uL (ref 200–950)
Monocytes Relative: 6 %
Neutro Abs: 6660 cells/uL (ref 1500–7800)
Neutrophils Relative %: 60 %
Platelets: 378 10*3/uL (ref 140–400)
RBC: 4.13 MIL/uL (ref 3.80–5.10)
RDW: 17.5 % — ABNORMAL HIGH (ref 11.0–15.0)
WBC: 11.1 10*3/uL — ABNORMAL HIGH (ref 3.8–10.8)

## 2015-09-25 LAB — COMPREHENSIVE METABOLIC PANEL
ALT: 12 U/L (ref 6–29)
AST: 14 U/L (ref 10–35)
Albumin: 3.8 g/dL (ref 3.6–5.1)
Alkaline Phosphatase: 84 U/L (ref 33–130)
BUN: 23 mg/dL (ref 7–25)
CO2: 24 mmol/L (ref 20–31)
Calcium: 9.6 mg/dL (ref 8.6–10.4)
Chloride: 103 mmol/L (ref 98–110)
Creat: 1.21 mg/dL — ABNORMAL HIGH (ref 0.50–1.05)
Glucose, Bld: 121 mg/dL — ABNORMAL HIGH (ref 65–99)
Potassium: 4.1 mmol/L (ref 3.5–5.3)
Sodium: 141 mmol/L (ref 135–146)
Total Bilirubin: 0.3 mg/dL (ref 0.2–1.2)
Total Protein: 6.9 g/dL (ref 6.1–8.1)

## 2015-09-25 LAB — HEMOGLOBIN A1C
Hgb A1c MFr Bld: 8.1 % — ABNORMAL HIGH (ref <5.7)
Mean Plasma Glucose: 186 mg/dL

## 2015-09-25 LAB — TSH: TSH: 0.76 mIU/L

## 2015-09-25 NOTE — Patient Instructions (Addendum)
Please make sure that you are minimizing the salt in your diet (not just added salt, but salt in pre-prepared foods!), and elevate your legs whenever you can.  Drink 64 ounces of water daily.  Use compression stockings as needed (I like [employer] Total Support support stockings).  Try OTC simethicone (like Mylanta Gas) to reduce the gas and bloating.    IF you received an x-ray today, you will receive an invoice from [facility]. Please contact [facility] at [phone] with questions or concerns regarding your invoice.   IF you received labwork today, you will receive an invoice from [facility]. Please contact [facility] at [phone] with questions or concerns regarding your invoice.   Our billing staff will not be able to assist you with questions regarding bills from these companies.  You will be contacted with the lab results as soon as they are available. The fastest way to get your results is to activate your My Chart account. Instructions are located on the last page of this paperwork. If you have not heard from us regarding the results in 2 weeks, please contact this office.    We recommend that you schedule a mammogram for breast cancer screening. Typically, you do not need a referral to do this. Please contact a local imaging center to schedule your mammogram.  [facility] - [phone]) [phone]  *ask for the Radiology Department [facility]) - [phone] or [phone]  [facility] - [phone]) [phone] [facility] - [phone] [facility] - [phone]) [phone]  *ask for the Radiology Department [facility] - [phone]  *ask for the Radiology Department [facility] - [phone]) [phone]  *ask for the Mammography Department [facility] - [phone]) [phone]

## 2015-09-25 NOTE — Progress Notes (Signed)
Patient ID: Melissa Chavez, female    DOB: 31-Jul-1960, 55 y.o.   MRN: [id number]  PCP: Harrison Mons, PA-C  Subjective:   Chief Complaint  Patient presents with  . Edema    both feet  . Hernia    feels like her hernia is back 1st problem    HPI Presents for evaluation of LEFT sided abdominal hernia. She is accompanied by her daughter.  History is difficult, requiring frequent redirection and clarification. She refers to lots of people as "she" and "her" without clarifying when she changes references.  Since her last visit with me, she has seen the NP at Dr. Marquis Buggy office. Started on Geodon. Advised she can continue the hydroxyzine for sleep. "Y'all are just trying to give me drugs."  LEFT abdominal wall pain. Previous surgical repair x 2. Dr. Lawrence Marseilles did the first one, no mesh. The second time mesh was placed, by a surgeon who has since died of Lou Gehrig's disease. The pain has been present x 1 week, and she's been complaining about it a lot to her daughter. Not associated with eating, defecation, not positional. No associated urinary symptoms or nausea. Describes bloating.  "My feet are swelling." "I believe there is something wrong with my heart." Someone she knows at church told her that swelling legs can mean a heart problem.  Another person at church has prophesied that she will minister to people in the hospital and nursing homes. Another told her not to take the Geodon.  "I think I have MS." The symptoms she believes indicate MS are pain elbows, arms, legs, feet, back. No HA, diplopia, weakness, increased fatigue.  Crying a lot. Feeling really sentimental about things, like one of her mother's friends died, and at the funeral, hugging the other woman's daughter, she felt overwhelmed with grief, like it was her own mother who had died.  Mother has vascular dementia. "She hates me. She said that I should be shot with shit." Frequently makes really inappropriate  comments.  She is due for labs regarding Diabetes: glucose, A1C and CMET. Tolerating those medications well, without adverse effects. States that she is taking all her medications as prescribed, though notes that she initially didn't take the Geodon regularly.  Review of Systems  Constitutional: Negative.   HENT: Negative for sore throat.   Eyes: Negative for visual disturbance.  Respiratory: Negative for cough, chest tightness, shortness of breath and wheezing.   Cardiovascular: Negative for chest pain and palpitations.  Gastrointestinal: Positive for abdominal pain (LEFT). Negative for nausea, vomiting and diarrhea. Abdominal distention: bloated.  Genitourinary: Negative for dysuria, urgency, frequency and hematuria. Vaginal discharge: intermittent brownish discharge, LMP 2-3 years ago, still has cramps when she would expect a period.  Musculoskeletal: Negative for myalgias and arthralgias.  Skin: Negative for rash.  Neurological: Negative for dizziness, weakness and headaches.  Psychiatric/Behavioral: Negative for decreased concentration. The patient is not nervous/anxious.        Patient Active Problem List   Diagnosis Date Noted  . 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 Prednisone adverse reaction 08/20/2015  . Abdominal pain, chronic, epigastric 06/30/2015  . Osteoarthritis of right foot 03/22/2015  . Type 2 diabetes mellitus with diabetic polyneuropathy, without long-term current use of insulin ([place]) 03/20/2015  . BMI 40.0-44.9, adult ([place]) 06/13/2013  . Overactive bladder 06/13/2013  . Allergic rhinitis 06/13/2013  . Asthma, moderate persistent 06/13/2013  . BIPOLAR DISORDER UNSPECIFIED 03/18/2008  . PERIPHERAL EDEMA 07/03/2007  . GERD  01/30/2007  . HYPERCHOLESTEROLEMIA 12/07/2006  . Essential hypertension 12/07/2006  . Osteoarthritis of right knee 12/07/2006  . ANEMIA, IRON DEFICIENCY 01/06/2006     Prior to Admission medications   Medication  Sig Start Date End Date Taking? Authorizing Provider  ADVAIR DISKUS 250-50 MCG/DOSE AEPB Inhale 1 puff by mouth into the lungs two times daily 07/01/15  Yes Shawnee Knapp, MD  blood glucose meter kit and supplies KIT Use as directed 4 times a day.  DX: R73.09 06/13/14  Yes Shawnee Knapp, MD  cetirizine (ZYRTEC) 10 MG tablet Take 1 tablet (10 mg total) by mouth at bedtime. 05/11/15  Yes Shawnee Knapp, MD  colchicine 0.6 MG tablet TAKE 2 TABLETS BY MOUTH NOW AND THEN 1 TABLET 1 HOUR LATER AS NEEDED FOR GOUT FLARE UP 07/05/15  Yes Historical Provider, MD  colchicine 0.6 MG tablet Take 2 tablets with onset of gout, then 1 tablet every 12 hours until gout flare resolves 09/14/15  Yes Tashea Othman, PA-C  diclofenac sodium (VOLTAREN) 1 % GEL Apply 4 g topically 2 (two) times daily. 06/30/15  Yes Darlyne Russian, MD  fluticasone (FLONASE) 50 MCG/ACT nasal spray PLACE 2 SPRAYS INTO BOTH NOSTRILS AT BEDTIME. 07/28/15  Yes Shawnee Knapp, MD  glucose blood test strip Use as directed 4 times a day.  DX: R73.09 06/13/14  Yes Shawnee Knapp, MD  hydrOXYzine (ATARAX/VISTARIL) 25 MG tablet  08/24/15  Yes Historical Provider, MD  ipratropium (ATROVENT HFA) 17 MCG/ACT inhaler Inhale 2 puffs into the lungs every 4 (four) hours as needed for wheezing. 08/05/15  Yes April Palumbo, MD  Lancets MISC Use as directed 4 times a day.  DX: R73.09 06/13/14  Yes Shawnee Knapp, MD  lisinopril (PRINIVIL,ZESTRIL) 40 MG tablet Take 1 tablet by mouth  daily 07/01/15  Yes Shawnee Knapp, MD  meloxicam (MOBIC) 15 MG tablet Take 15 mg by mouth daily.  08/17/15  Yes Historical Provider, MD  metFORMIN (GLUCOPHAGE) 1000 MG tablet Take 1 tablet (1,000 mg total) by mouth 2 (two) times daily with a meal. 05/12/15  Yes Shawnee Knapp, MD  oxybutynin (DITROPAN XL) 15 MG 24 hr tablet Take 1 tablet by mouth at  bedtime 07/01/15  Yes Shawnee Knapp, MD  pravastatin (PRAVACHOL) 40 MG tablet Take 1 tablet by mouth  daily 07/01/15  Yes Shawnee Knapp, MD  tiZANidine (ZANAFLEX) 4 MG tablet TAKE 1 TABLET (4  MG TOTAL) BY MOUTH AT BEDTIME. 05/12/15  Yes Shawnee Knapp, MD  triamterene-hydrochlorothiazide (MAXZIDE-25) 37.5-25 MG tablet Take 1 tablet by mouth daily.  07/20/15  Yes Historical Provider, MD  VENTOLIN HFA 108 (90 Base) MCG/ACT inhaler Inhale 2 puffs by mouth  every 6 hours as needed for wheezing 07/25/15  Yes Shawnee Knapp, MD     Allergies  Allergen Reactions  . Fish Allergy Anaphylaxis  . Banana Other (See Comments)    unknown  . Prednisone Other (See Comments)    Anxiety, erratic behavior, ?hallucinations Requiring ED and psychiatric evaluation       Objective:  Physical Exam  Constitutional: She is oriented to person, place, and time. She appears well-developed and well-nourished. She is active and cooperative. No distress.  BP 172/90 mmHg  Pulse 93  Temp(Src) 98.3 F (36.8 C) (Oral)  Resp 16  Ht '5\' 2"'  (1.575 m)  Wt 221 lb (100.245 kg)  BMI 40.41 kg/m2  SpO2 93%  LMP 05/30/2013  HENT:  Head: Normocephalic and atraumatic.  Right Ear: Hearing normal.  Left Ear: Hearing normal.  Eyes: Conjunctivae are normal. No scleral icterus.  Neck: Normal range of motion. Neck supple. No thyromegaly present.  Cardiovascular: Normal rate, regular rhythm and normal heart sounds.   Pulses:      Radial pulses are 2+ on the right side, and 2+ on the left side.  1-2+ edema of both lower extremities. No presacral edema.  Pulmonary/Chest: Effort normal and breath sounds normal.  Abdominal: Soft. Normal appearance and bowel sounds are normal. She exhibits no shifting dullness, no distension, no pulsatile liver, no fluid wave, no abdominal bruit, no ascites, no pulsatile midline mass and no mass. There is no hepatosplenomegaly.    Lymphadenopathy:       Head (right side): No tonsillar, no preauricular, no posterior auricular and no occipital adenopathy present.       Head (left side): No tonsillar, no preauricular, no posterior auricular and no occipital adenopathy present.    She has no  cervical adenopathy.       Right: No supraclavicular adenopathy present.       Left: No supraclavicular adenopathy present.  Neurological: She is alert and oriented to person, place, and time. No sensory deficit.  Skin: Skin is warm, dry and intact. No rash noted. No cyanosis or erythema. Nails show no clubbing.  Psychiatric: Her mood appears anxious. Her affect is not angry, not blunt, not labile and not inappropriate. Her speech is rapid and/or pressured and tangential. Her speech is not delayed and not slurred. She is not agitated, not aggressive, not hyperactive, not slowed, not withdrawn, not actively hallucinating and not combative. Thought content is paranoid. Thought content is not delusional. Cognition and memory are not impaired. She expresses impulsivity. She does not express inappropriate judgment. She does not exhibit a depressed mood. She expresses no homicidal and no suicidal ideation. She expresses no suicidal plans and no homicidal plans. She is communicative. She exhibits normal recent memory and normal remote memory. She is inattentive.           Assessment & Plan:   1. Essential hypertension Uncontrolled today, though she has been controlled at her recent visits, so elect against adjusting regimen today. Will reassess in 4 weeks and adjust if needed. - CBC with Differential/Platelet - Comprehensive metabolic panel - TSH  2. Bilateral leg edema Reduce dietary salt, increase oral hydration, elevate legs and use OTC compression stockings (recommended [employer] Total Support). If BP remains elevated at next visit, will adjust regimen and expect improvement in edema. - Comprehensive metabolic panel - TSH  3. Abdominal pain, left upper quadrant CT scan and CMET. She is overdue for colonoscopy and may need GYN evaluation (due to possible spotting). She isn't interested in additional evaluation of anything beyond the hernia today. - CT ABDOMEN PELVIS W CONTRAST; Future -  Comprehensive metabolic panel  4. Type 2 diabetes mellitus with diabetic polyneuropathy, without long-term current use of insulin ([place]) Await A1C results and adjust treatment as indicated. - Hemoglobin A1c - Comprehensive metabolic panel  5. ANEMIA, IRON DEFICIENCY Await lab results. - CBC with Differential/Platelet  6. BIPOLAR DISORDER UNSPECIFIED Encouraged her to take the medication prescribed by and to follow-up with psychiatry. Counseled against taking medical advice from lay people, and encouraged her to bring any concerns raised by her church community to her providers to discuss, rather than stopping treatment without consultation.   Fara Chute, PA-C Physician Assistant-Certified [facility]

## 2015-09-28 ENCOUNTER — Telehealth: Payer: Self-pay | Admitting: Radiology

## 2015-09-28 ENCOUNTER — Other Ambulatory Visit: Payer: Self-pay | Admitting: Physician Assistant

## 2015-09-28 DIAGNOSIS — Z1231 Encounter for screening mammogram for malignant neoplasm of breast: Secondary | ICD-10-CM

## 2015-09-28 NOTE — Telephone Encounter (Signed)
Pt called in requesing her A1C results. I informed pt that results have not been reviewed/released for her to view. Pt would like results forwarded to "Dr. Lorin Picket" at [facility] [address].

## 2015-09-29 ENCOUNTER — Ambulatory Visit: Payer: Medicare Other | Admitting: Physician Assistant

## 2015-09-29 NOTE — Telephone Encounter (Signed)
A1C was 8.1%  [facility]?  I do not see a Dr. Lorin Picket there. OK to fax results.

## 2015-09-30 ENCOUNTER — Telehealth: Payer: Self-pay

## 2015-09-30 NOTE — Telephone Encounter (Signed)
PATIENT WANTS TO SPEAK TO A NURSE. SHE WOULD NOT TELL ME WHY SHE WAS CALLING. BEST PHONE [phone] (CELL) MBC

## 2015-10-01 ENCOUNTER — Other Ambulatory Visit: Payer: Self-pay

## 2015-10-02 DIAGNOSIS — N281 Cyst of kidney, acquired: Secondary | ICD-10-CM | POA: Diagnosis not present

## 2015-10-02 DIAGNOSIS — M1990 Unspecified osteoarthritis, unspecified site: Secondary | ICD-10-CM | POA: Diagnosis not present

## 2015-10-02 DIAGNOSIS — Z1231 Encounter for screening mammogram for malignant neoplasm of breast: Secondary | ICD-10-CM | POA: Diagnosis not present

## 2015-10-02 DIAGNOSIS — R1012 Left upper quadrant pain: Secondary | ICD-10-CM | POA: Diagnosis not present

## 2015-10-05 ENCOUNTER — Telehealth: Payer: Self-pay

## 2015-10-05 NOTE — Telephone Encounter (Signed)
PATIENT WOULD LIKE CHELLE TO GIVE HER THE RESULTS OF HER CT SCAN OF THE ABDOMEN SHE HAD DONE ON Friday 10/02/2015. BEST PHONE [phone] (CELL)  PHARMACY CHOICE IS [facility] ON [address] ([facility]  MBC

## 2015-10-06 ENCOUNTER — Encounter: Payer: Self-pay | Admitting: Physician Assistant

## 2015-10-06 ENCOUNTER — Ambulatory Visit: Payer: Medicare Other | Admitting: Physician Assistant

## 2015-10-06 NOTE — Telephone Encounter (Signed)
I believe she has an appointment with you today.

## 2015-10-06 NOTE — Telephone Encounter (Signed)
I do not yet have the results of her scan. I will contact her when I receive them.

## 2015-10-07 NOTE — Telephone Encounter (Signed)
Lmom to cb.

## 2015-10-07 NOTE — Telephone Encounter (Signed)
I received the results of the CT scan of the abdomen/pelvis.  There is no cause for the pain. There is some arthritis in the lumbar spine (L4-5 and L5-S1) and cysts on both kidneys.  If her pain persists, I recommend evaluation with general surgery.  If they determine this is NOT a hernia, then GI evaluation would be the next step.

## 2015-10-08 NOTE — Telephone Encounter (Signed)
lmom to cb. 

## 2015-10-09 ENCOUNTER — Emergency Department (HOSPITAL_COMMUNITY): Payer: Medicare Other

## 2015-10-09 ENCOUNTER — Emergency Department (HOSPITAL_COMMUNITY)
Admission: EM | Admit: 2015-10-09 | Discharge: 2015-10-09 | Disposition: A | Discharge status: Home / Self Care | Payer: Medicare Other | Attending: Emergency Medicine | Admitting: Emergency Medicine

## 2015-10-09 ENCOUNTER — Encounter (HOSPITAL_COMMUNITY): Payer: Self-pay | Admitting: Emergency Medicine

## 2015-10-09 DIAGNOSIS — E119 Type 2 diabetes mellitus without complications: Secondary | ICD-10-CM | POA: Diagnosis not present

## 2015-10-09 DIAGNOSIS — S199XXA Unspecified injury of neck, initial encounter: Secondary | ICD-10-CM | POA: Diagnosis not present

## 2015-10-09 DIAGNOSIS — Z7984 Long term (current) use of oral hypoglycemic drugs: Secondary | ICD-10-CM | POA: Insufficient documentation

## 2015-10-09 DIAGNOSIS — Z79899 Other long term (current) drug therapy: Secondary | ICD-10-CM | POA: Diagnosis not present

## 2015-10-09 DIAGNOSIS — Y9389 Activity, other specified: Secondary | ICD-10-CM | POA: Diagnosis not present

## 2015-10-09 DIAGNOSIS — Y999 Unspecified external cause status: Secondary | ICD-10-CM | POA: Insufficient documentation

## 2015-10-09 DIAGNOSIS — M25461 Effusion, right knee: Secondary | ICD-10-CM | POA: Diagnosis not present

## 2015-10-09 DIAGNOSIS — S299XXA Unspecified injury of thorax, initial encounter: Secondary | ICD-10-CM | POA: Diagnosis not present

## 2015-10-09 DIAGNOSIS — R079 Chest pain, unspecified: Secondary | ICD-10-CM | POA: Diagnosis not present

## 2015-10-09 DIAGNOSIS — M542 Cervicalgia: Secondary | ICD-10-CM | POA: Insufficient documentation

## 2015-10-09 DIAGNOSIS — R42 Dizziness and giddiness: Secondary | ICD-10-CM | POA: Diagnosis not present

## 2015-10-09 DIAGNOSIS — T148 Other injury of unspecified body region: Secondary | ICD-10-CM | POA: Diagnosis not present

## 2015-10-09 DIAGNOSIS — J45909 Unspecified asthma, uncomplicated: Secondary | ICD-10-CM | POA: Insufficient documentation

## 2015-10-09 DIAGNOSIS — I10 Essential (primary) hypertension: Secondary | ICD-10-CM | POA: Insufficient documentation

## 2015-10-09 DIAGNOSIS — Y92410 Unspecified street and highway as the place of occurrence of the external cause: Secondary | ICD-10-CM | POA: Insufficient documentation

## 2015-10-09 DIAGNOSIS — M545 Low back pain: Secondary | ICD-10-CM | POA: Diagnosis not present

## 2015-10-09 DIAGNOSIS — S3992XA Unspecified injury of lower back, initial encounter: Secondary | ICD-10-CM | POA: Diagnosis not present

## 2015-10-09 LAB — I-STAT CHEM 8, ED
BUN: 16 mg/dL (ref 6–20)
Calcium, Ion: 1.21 mmol/L (ref 1.13–1.30)
Chloride: 104 mmol/L (ref 101–111)
Creatinine, Ser: 1.2 mg/dL — ABNORMAL HIGH (ref 0.44–1.00)
Glucose, Bld: 135 mg/dL — ABNORMAL HIGH (ref 65–99)
HCT: 31 % — ABNORMAL LOW (ref 36.0–46.0)
Hemoglobin: 10.5 g/dL — ABNORMAL LOW (ref 12.0–15.0)
Potassium: 3.8 mmol/L (ref 3.5–5.1)
Sodium: 143 mmol/L (ref 135–145)
TCO2: 28 mmol/L (ref 0–100)

## 2015-10-09 MED ORDER — IBUPROFEN 400 MG PO TABS: 400.0000 mg | ORAL_TABLET | Freq: Four times a day (QID) | ORAL | 0 refills | Status: DC | PRN

## 2015-10-09 NOTE — ED Notes (Signed)
Pt stating that she wants to leave and go down to the cafeteria to get something to eat, staff offered he a sandwich after she complained about no one giving her one, she then rejected the sandwich. Pt came back out into the hall complaining again, writer left a sandwich in her room while her daughters tried to calm her down and pull her back into her room.

## 2015-10-09 NOTE — Telephone Encounter (Signed)
Unable to reach the patient at this time, left message on AM to call back.
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
## 2015-10-09 NOTE — Discharge Instructions (Signed)
Take medications as needed for pain, follow-up with a primary care doctor
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
## 2015-10-12 ENCOUNTER — Encounter: Payer: Self-pay | Admitting: *Deleted

## 2015-10-12 NOTE — Telephone Encounter (Signed)
Unable to reach letter sent.  Patient had two messages.

## 2015-10-12 NOTE — Telephone Encounter (Signed)
Sent unable to reach letter.

## 2015-10-14 ENCOUNTER — Encounter (HOSPITAL_COMMUNITY): Payer: Self-pay | Admitting: Emergency Medicine

## 2015-10-14 ENCOUNTER — Emergency Department (HOSPITAL_COMMUNITY)
Admission: EM | Admit: 2015-10-14 | Discharge: 2015-10-14 | Disposition: A | Discharge status: Home / Self Care | Payer: Medicare Other | Attending: Emergency Medicine | Admitting: Emergency Medicine

## 2015-10-14 DIAGNOSIS — R109 Unspecified abdominal pain: Secondary | ICD-10-CM

## 2015-10-14 DIAGNOSIS — J45909 Unspecified asthma, uncomplicated: Secondary | ICD-10-CM | POA: Diagnosis not present

## 2015-10-14 DIAGNOSIS — Z7951 Long term (current) use of inhaled steroids: Secondary | ICD-10-CM | POA: Insufficient documentation

## 2015-10-14 DIAGNOSIS — Z7984 Long term (current) use of oral hypoglycemic drugs: Secondary | ICD-10-CM | POA: Insufficient documentation

## 2015-10-14 DIAGNOSIS — I10 Essential (primary) hypertension: Secondary | ICD-10-CM | POA: Insufficient documentation

## 2015-10-14 DIAGNOSIS — Z79899 Other long term (current) drug therapy: Secondary | ICD-10-CM | POA: Insufficient documentation

## 2015-10-14 DIAGNOSIS — E119 Type 2 diabetes mellitus without complications: Secondary | ICD-10-CM | POA: Diagnosis not present

## 2015-10-14 DIAGNOSIS — K5900 Constipation, unspecified: Secondary | ICD-10-CM | POA: Diagnosis not present

## 2015-10-14 LAB — URINALYSIS, ROUTINE W REFLEX MICROSCOPIC
Bilirubin Urine: NEGATIVE
Glucose, UA: NEGATIVE mg/dL
Hgb urine dipstick: NEGATIVE
Ketones, ur: NEGATIVE mg/dL
Leukocytes, UA: NEGATIVE
Nitrite: NEGATIVE
Protein, ur: NEGATIVE mg/dL
Specific Gravity, Urine: 1.016 (ref 1.005–1.030)
pH: 5.5 (ref 5.0–8.0)

## 2015-10-14 MED ORDER — SUCRALFATE 1 GM/10ML PO SUSP: 1.0000 g | Freq: Three times a day (TID) | ORAL | 0 refills | Status: DC

## 2015-10-14 MED ORDER — DICYCLOMINE HCL 10 MG PO CAPS
10.0000 mg | ORAL_CAPSULE | Freq: Once | ORAL | Status: AC
  Administered 2015-10-14: 10 mg via ORAL
  Filled 2015-10-14: qty 1

## 2015-10-14 MED ORDER — GI COCKTAIL ~~LOC~~
30.0000 mL | Freq: Once | ORAL | Status: DC
  Filled 2015-10-14: qty 30

## 2015-10-14 MED ORDER — POLYETHYLENE GLYCOL 3350 17 GM/SCOOP PO POWD: 17.0000 g | Freq: Every day | ORAL | 0 refills | Status: DC
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
## 2015-10-14 NOTE — ED Notes (Signed)
Bed: WA17 Expected date:  Expected time:  Means of arrival:  Comments: Perlie GoldRussell

## 2015-10-14 NOTE — ED Notes (Signed)
Pt has been continuously verbally abusive to staff, including this Clinical research associatewriter during triage.  Pt refuses to answer questions and is vague about her c/o.  Attempts made to improve pt satisfaction w/o success.  This Clinical research associatewriter kindly asked pt to treat staff w/ respect to which she stated, "I don't have to."  Dr. Nicanor AlconPalumbo called to pt's room to expedite care in an effort to meet pt's needs.

## 2015-10-15 LAB — URINE CULTURE

## 2015-10-20 DIAGNOSIS — F319 Bipolar disorder, unspecified: Secondary | ICD-10-CM | POA: Diagnosis not present

## 2015-10-21 ENCOUNTER — Other Ambulatory Visit: Payer: Self-pay

## 2015-10-21 MED ORDER — ACCU-CHEK AVIVA PLUS W/DEVICE KIT
PACK | 0 refills | Status: DC
Start: 2015-10-21 — End: 2016-08-22

## 2015-10-21 MED ORDER — ACCU-CHEK SOFTCLIX LANCETS MISC: 3 refills | Status: DC

## 2015-10-21 MED ORDER — GLUCOSE BLOOD VI STRP: ORAL_STRIP | 3 refills | Status: AC

## 2015-10-21 MED ORDER — ACCU-CHEK AVIVA VI SOLN: 3 refills | Status: AC

## 2015-10-27 ENCOUNTER — Ambulatory Visit: Payer: Medicare Other | Admitting: Physician Assistant

## 2015-10-27 ENCOUNTER — Other Ambulatory Visit: Payer: Self-pay | Admitting: Family Medicine

## 2015-11-26 DIAGNOSIS — J029 Acute pharyngitis, unspecified: Secondary | ICD-10-CM | POA: Diagnosis not present

## 2015-11-26 DIAGNOSIS — I10 Essential (primary) hypertension: Secondary | ICD-10-CM | POA: Diagnosis not present

## 2015-11-26 DIAGNOSIS — Z780 Asymptomatic menopausal state: Secondary | ICD-10-CM | POA: Diagnosis not present

## 2015-11-26 DIAGNOSIS — R05 Cough: Secondary | ICD-10-CM | POA: Diagnosis not present

## 2015-11-26 DIAGNOSIS — E119 Type 2 diabetes mellitus without complications: Secondary | ICD-10-CM | POA: Diagnosis not present

## 2015-11-26 DIAGNOSIS — H9209 Otalgia, unspecified ear: Secondary | ICD-10-CM | POA: Diagnosis not present

## 2015-11-26 DIAGNOSIS — Z91018 Allergy to other foods: Secondary | ICD-10-CM | POA: Diagnosis not present

## 2015-11-26 DIAGNOSIS — M47819 Spondylosis without myelopathy or radiculopathy, site unspecified: Secondary | ICD-10-CM | POA: Diagnosis not present

## 2015-11-26 DIAGNOSIS — R4781 Slurred speech: Secondary | ICD-10-CM | POA: Diagnosis not present

## 2015-11-26 DIAGNOSIS — R51 Headache: Secondary | ICD-10-CM | POA: Diagnosis not present

## 2015-11-26 DIAGNOSIS — R0981 Nasal congestion: Secondary | ICD-10-CM | POA: Diagnosis not present

## 2015-11-29 ENCOUNTER — Emergency Department (HOSPITAL_COMMUNITY)
Admission: EM | Admit: 2015-11-29 | Discharge: 2015-11-29 | Disposition: A | Discharge status: Home / Self Care | Payer: Medicare Other | Attending: Emergency Medicine | Admitting: Emergency Medicine

## 2015-11-29 ENCOUNTER — Encounter (HOSPITAL_COMMUNITY): Payer: Self-pay | Admitting: Nurse Practitioner

## 2015-11-29 DIAGNOSIS — Z79899 Other long term (current) drug therapy: Secondary | ICD-10-CM | POA: Diagnosis not present

## 2015-11-29 DIAGNOSIS — Z7951 Long term (current) use of inhaled steroids: Secondary | ICD-10-CM | POA: Diagnosis not present

## 2015-11-29 DIAGNOSIS — R0981 Nasal congestion: Secondary | ICD-10-CM | POA: Diagnosis present

## 2015-11-29 DIAGNOSIS — I10 Essential (primary) hypertension: Secondary | ICD-10-CM | POA: Insufficient documentation

## 2015-11-29 DIAGNOSIS — J45909 Unspecified asthma, uncomplicated: Secondary | ICD-10-CM | POA: Insufficient documentation

## 2015-11-29 DIAGNOSIS — E119 Type 2 diabetes mellitus without complications: Secondary | ICD-10-CM | POA: Diagnosis not present

## 2015-11-29 DIAGNOSIS — R51 Headache: Secondary | ICD-10-CM | POA: Diagnosis present

## 2015-11-29 DIAGNOSIS — Z7984 Long term (current) use of oral hypoglycemic drugs: Secondary | ICD-10-CM | POA: Insufficient documentation

## 2015-11-29 MED ORDER — CETIRIZINE HCL 10 MG PO TABS: 10.0000 mg | ORAL_TABLET | Freq: Every day | ORAL | 1 refills | Status: DC

## 2015-11-29 MED ORDER — IBUPROFEN 400 MG PO TABS: 400.0000 mg | ORAL_TABLET | Freq: Four times a day (QID) | ORAL | 0 refills | Status: DC | PRN

## 2015-11-29 NOTE — ED Notes (Addendum)
Pt observed ambulating in room to restroom with a steady gait.

## 2015-11-29 NOTE — Discharge Instructions (Signed)
You have chronic sinus inflammation.  This was seen on your CT scan performed at [facility].  There were also incidental findings of an old small stroke.  You should see a neurologist for this.  Please drink plenty of fluids.  Return if your symptoms worsen.  Otherwise, please see your primary care doctor ASAP.

## 2015-11-29 NOTE — ED Triage Notes (Signed)
Pt states he face/sinus hurts, was seen at [facility] where she was told she is dehydrated and wants to know if she "is still dehydrated."

## 2015-11-29 NOTE — ED Provider Notes (Signed)
[facility] DEPT Provider Note   CSN: [id number] Arrival date & time: 11/29/15  2127     History   Chief Complaint Chief Complaint  Patient presents with  . Facial Pain  . Generalized Body Aches    HPI Melissa Chavez is a 55 y.o. female.  Patient presents to the ED with a chief complaint of chronic sinus congestion.  States that she has been congested for the past several months.  States that she was seen at [facility] earlier this week and had a CT scan of her head.  This CT showed prior old stroke and chronic sinus inflammation.  She states that she was told she was also mildly dehydrated.  Her Creatinine was 1.19.  She was given a liter of fluid.  She states that she has been pushing fluids, drinking water and gatorade.  She states that she is urinating.  She denies any other symptoms.  There are no modifying factors.   The history is provided by the patient. No language interpreter was used.    Past Medical History:  Diagnosis Date  . Allergy   . Anemia   . Asthma   . Bipolar 2 disorder ([place])   . Diabetes mellitus without complication ([place])   . GERD (gastroesophageal reflux disease)   . Glaucoma   . Hernia   . Hypertension     Patient Active Problem List   Diagnosis Date Noted  . Sinus congestion 11/29/2015  . Bilateral leg edema 09/25/2015  . Abdominal pain, left upper quadrant 09/25/2015  . 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 Prednisone adverse reaction 08/20/2015  . Abdominal pain, chronic, epigastric 06/30/2015  . Osteoarthritis of right foot 03/22/2015  . Type 2 diabetes mellitus with diabetic polyneuropathy, without long-term current use of insulin ([place]) 03/20/2015  . BMI 40.0-44.9, adult ([place]) 06/13/2013  . Overactive bladder 06/13/2013  . Allergic rhinitis 06/13/2013  . Asthma, moderate persistent 06/13/2013  . BIPOLAR DISORDER UNSPECIFIED 03/18/2008  . PERIPHERAL EDEMA 07/03/2007  . GERD 01/30/2007  . HYPERCHOLESTEROLEMIA  12/07/2006  . Essential hypertension 12/07/2006  . Osteoarthritis of right knee 12/07/2006  . ANEMIA, IRON DEFICIENCY 01/06/2006    Past Surgical History:  Procedure Laterality Date  . HERNIA REPAIR    . TONSILLECTOMY    . TUBAL LIGATION      OB History    No data available       Home Medications    Prior to Admission medications   Medication Sig Start Date End Date Taking? Authorizing Provider  ACCU-CHEK SOFTCLIX LANCETS lancets Test blood sugar once daily. Dx: E11.42 10/21/15   Wardell Honour, MD  ADVAIR DISKUS 250-50 MCG/DOSE AEPB Inhale 1 puff by mouth into the lungs two times daily 07/01/15   Shawnee Knapp, MD  Blood Glucose Calibration (ACCU-CHEK AVIVA) SOLN Test blood sugar once daily. Dx: E11.42 10/21/15   Wardell Honour, MD  blood glucose meter kit and supplies KIT Use as directed 4 times a day.  DX: R73.09 06/13/14   Shawnee Knapp, MD  Blood Glucose Monitoring Suppl (ACCU-CHEK AVIVA PLUS) w/Device KIT Test blood sugar once daily. Dx: E11.42 10/21/15   Wardell Honour, MD  cetirizine (ZYRTEC ALLERGY) 10 MG tablet Take 1 tablet (10 mg total) by mouth daily. 11/29/15   Montine Circle, PA-C  colchicine 0.6 MG tablet Take 2 tablets with onset of gout, then 1 tablet every 12 hours until gout flare resolves Patient taking differently: Take 0.12 mg by  mouth daily as needed (gout).  09/14/15   Chelle Jeffery, PA-C  diclofenac sodium (VOLTAREN) 1 % GEL Apply 4 g topically 2 (two) times daily. Patient taking differently: Apply 4 g topically 2 (two) times daily as needed (pain).  06/30/15   Darlyne Russian, MD  fluticasone (FLONASE) 50 MCG/ACT nasal spray PLACE 2 SPRAYS INTO BOTH NOSTRILS AT BEDTIME. Patient not taking: Reported on 10/09/2015 07/28/15   Shawnee Knapp, MD  glucose blood (ACCU-CHEK AVIVA PLUS) test strip Test blood sugar once daily. Dx: E11.42 10/21/15   Wardell Honour, MD  hydrOXYzine (ATARAX/VISTARIL) 25 MG tablet Take 25 mg by mouth every 6 (six) hours as needed for anxiety or itching.   08/24/15   Historical Provider, MD  ibuprofen (ADVIL,MOTRIN) 400 MG tablet Take 1 tablet (400 mg total) by mouth every 6 (six) hours as needed. 10/09/15   Dorie Rank, MD  ipratropium (ATROVENT HFA) 17 MCG/ACT inhaler Inhale 2 puffs into the lungs every 4 (four) hours as needed for wheezing. 08/05/15   April Palumbo, MD  lisinopril (PRINIVIL,ZESTRIL) 40 MG tablet Take 1 tablet by mouth  daily 10/30/15   Wardell Honour, MD  meloxicam (MOBIC) 15 MG tablet Take 15 mg by mouth daily.  08/17/15   Historical Provider, MD  metFORMIN (GLUCOPHAGE) 1000 MG tablet Take 1 tablet (1,000 mg total) by mouth 2 (two) times daily with a meal. 05/12/15   Shawnee Knapp, MD  oxybutynin (DITROPAN XL) 15 MG 24 hr tablet Take 1 tablet by mouth at  bedtime 10/30/15   Wardell Honour, MD  polyethylene glycol powder (MIRALAX) powder Take 17 g by mouth daily. 10/14/15   April Palumbo, MD  pravastatin (PRAVACHOL) 40 MG tablet Take 1 tablet by mouth  daily 10/30/15   Wardell Honour, MD  sucralfate (CARAFATE) 1 GM/10ML suspension Take 10 mLs (1 g total) by mouth 4 (four) times daily -  with meals and at bedtime. 10/14/15   April Palumbo, MD  tiZANidine (ZANAFLEX) 4 MG tablet TAKE 1 TABLET (4 MG TOTAL) BY MOUTH AT BEDTIME. 05/12/15   Shawnee Knapp, MD  triamterene-hydrochlorothiazide (MAXZIDE-25) 37.5-25 MG tablet Take 1 tablet by mouth daily.  07/20/15   Historical Provider, MD  VENTOLIN HFA 108 (90 Base) MCG/ACT inhaler Inhale 2 puffs by mouth  every 6 hours as needed for wheezing 07/25/15   Shawnee Knapp, MD  ziprasidone (GEODON) 40 MG capsule Take 40 mg by mouth daily. 09/22/15   Historical Provider, MD    Family History Family History  Problem Relation Age of Onset  . Hypertension Mother   . Diabetes Mother   . Hyperlipidemia Mother   . Stroke Mother   . Heart disease Mother   . Pulmonary embolism Mother   . Hypertension Father   . Diabetes Father   . Hyperlipidemia Father     Social History Social History  Substance Use Topics  . Smoking  status: Never Smoker  . Smokeless tobacco: Never Used  . Alcohol use No     Allergies   Fish allergy; Banana; Other; and Prednisone   Review of Systems Review of Systems  Constitutional: Negative for chills and fever.  HENT: Positive for postnasal drip, sinus pressure and sneezing. Negative for rhinorrhea and sore throat.   Respiratory: Negative for cough and shortness of breath.   Cardiovascular: Negative for chest pain.  Gastrointestinal: Negative for abdominal pain, constipation, diarrhea, nausea and vomiting.  Genitourinary: Negative for dysuria.  All other systems reviewed  and are negative.    Physical Exam Updated Vital Signs BP 160/91 (BP Location: Right Arm)   Pulse 84   Temp 97.7 F (36.5 C) (Oral)   Resp 14   LMP 05/30/2013   SpO2 100%   Physical Exam Physical Exam  Constitutional: Pt  is oriented to person, place, and time. Appears well-developed and well-nourished. No distress.  HENT:  Head: Normocephalic and atraumatic.  Right Ear: Tympanic membrane, external ear and ear canal normal.  Left Ear: Tympanic membrane, external ear and ear canal normal.  Nose: Mucosal edema and moderate rhinorrhea present. No epistaxis. Right sinus exhibits no maxillary sinus tenderness and no frontal sinus tenderness. Left sinus exhibits no maxillary sinus tenderness and no frontal sinus tenderness.  Mouth/Throat: Uvula is midline and mucous membranes are normal. Mucous membranes are not pale and not cyanotic. No oropharyngeal exudate, posterior oropharyngeal edema, posterior oropharyngeal erythema or tonsillar abscesses.  Eyes: Conjunctivae are normal. Pupils are equal, round, and reactive to light.  Neck: Normal range of motion and full passive range of motion without pain.  Cardiovascular: Normal rate and intact distal pulses.   Pulmonary/Chest: Effort normal and breath sounds normal. No stridor.  Clear and equal breath sounds without focal wheezes, rhonchi, rales  Abdominal:  Soft. Bowel sounds are normal. There is no tenderness.  Musculoskeletal: Normal range of motion.  Lymphadenopathy:    Pthas no cervical adenopathy.  Neurological: Pt is alert and oriented to person, place, and time.  Skin: Skin is warm and dry. No rash noted. Pt is not diaphoretic.  Psychiatric: Normal mood and affect.  Nursing note and vitals reviewed.    ED Treatments / Results  Labs (all labs ordered are listed, but only abnormal results are displayed) Labs Reviewed - No data to display  EKG  EKG Interpretation None       Radiology No results found.  Procedures Procedures (including critical care time)  Medications Ordered in ED Medications - No data to display   Initial Impression / Assessment and Plan / ED Course  I have reviewed the triage vital signs and the nursing notes.  Pertinent labs & imaging results that were available during my care of the patient were reviewed by me and considered in my medical decision making (see chart for details).  Clinical Course    Patient here with chronic sinus congestion.  Will switch from claritin to zyrtec.  Advised follow-up with PCP.  VSS.  Well appearing.  Final Clinical Impressions(s) / ED Diagnoses   Final diagnoses:  Sinus congestion    New Prescriptions New Prescriptions   CETIRIZINE (ZYRTEC ALLERGY) 10 MG TABLET    Take 1 tablet (10 mg total) by mouth daily.     Montine Circle, PA-C 11/29/15 2224    Quintella Reichert, MD 12/01/15 [id number]

## 2015-12-06 ENCOUNTER — Other Ambulatory Visit: Payer: Self-pay | Admitting: Family Medicine

## 2015-12-14 DIAGNOSIS — S9031XA Contusion of right foot, initial encounter: Secondary | ICD-10-CM | POA: Diagnosis not present

## 2015-12-14 DIAGNOSIS — M79671 Pain in right foot: Secondary | ICD-10-CM | POA: Diagnosis not present

## 2015-12-16 ENCOUNTER — Encounter (HOSPITAL_COMMUNITY): Payer: Self-pay | Admitting: Family Medicine

## 2015-12-16 ENCOUNTER — Emergency Department (HOSPITAL_COMMUNITY): Payer: Medicare Other

## 2015-12-16 ENCOUNTER — Emergency Department (HOSPITAL_COMMUNITY)
Admission: EM | Admit: 2015-12-16 | Discharge: 2015-12-16 | Disposition: A | Discharge status: Home / Self Care | Payer: Medicare Other | Attending: Emergency Medicine | Admitting: Emergency Medicine

## 2015-12-16 DIAGNOSIS — W501XXA Accidental kick by another person, initial encounter: Secondary | ICD-10-CM | POA: Diagnosis not present

## 2015-12-16 DIAGNOSIS — J45909 Unspecified asthma, uncomplicated: Secondary | ICD-10-CM | POA: Diagnosis not present

## 2015-12-16 DIAGNOSIS — E119 Type 2 diabetes mellitus without complications: Secondary | ICD-10-CM | POA: Insufficient documentation

## 2015-12-16 DIAGNOSIS — Y939 Activity, unspecified: Secondary | ICD-10-CM | POA: Diagnosis not present

## 2015-12-16 DIAGNOSIS — Y999 Unspecified external cause status: Secondary | ICD-10-CM | POA: Insufficient documentation

## 2015-12-16 DIAGNOSIS — M79671 Pain in right foot: Secondary | ICD-10-CM | POA: Diagnosis not present

## 2015-12-16 DIAGNOSIS — S9031XA Contusion of right foot, initial encounter: Secondary | ICD-10-CM

## 2015-12-16 DIAGNOSIS — Y929 Unspecified place or not applicable: Secondary | ICD-10-CM | POA: Diagnosis not present

## 2015-12-16 DIAGNOSIS — Z7984 Long term (current) use of oral hypoglycemic drugs: Secondary | ICD-10-CM | POA: Diagnosis not present

## 2015-12-16 DIAGNOSIS — Z79899 Other long term (current) drug therapy: Secondary | ICD-10-CM | POA: Diagnosis not present

## 2015-12-16 DIAGNOSIS — M7989 Other specified soft tissue disorders: Secondary | ICD-10-CM | POA: Diagnosis not present

## 2015-12-16 DIAGNOSIS — S99921A Unspecified injury of right foot, initial encounter: Secondary | ICD-10-CM | POA: Diagnosis present

## 2015-12-16 DIAGNOSIS — I10 Essential (primary) hypertension: Secondary | ICD-10-CM | POA: Insufficient documentation

## 2015-12-16 NOTE — ED Notes (Signed)
Previous nurse went over discharge paper work prior to discharge. Pt given ice pack and wheelchair used to get to lobby. Pt alert and oriented upon discharge. Refused to sign sig pad prior to discharge.
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
## 2015-12-16 NOTE — Progress Notes (Signed)
EDCM noted patient to have been seen in ED 10 times within 6 months. EDCM spoke to patient at bedside, explained role.  [facility] to see if these services would be of interest to patient.  [facility]  [facility] with [facility].  Explained she may get a phone call within three days.  Explained to patient if she did not want these services she may refuse them.  Patient concerned about her information being "leaked."  EDCM explained to patient that her information is private, and none of her information should be leaked.  Discussed patient with EDPA.  No further EDCM needs at this time.

## 2015-12-16 NOTE — Discharge Instructions (Signed)
Continue taking your meloxicam OR ibuprofen as prescribed by your doctor. Do not take both. Ice your foot 3-4 times daily alternating 20 minutes on, 20 minutes off. Wear your postop shoe for comfort and when ambulating. Please follow-up with your orthopedist at your scheduled appointment or Dr. Orvis Brillowland for further evaluation and treatment if your symptoms are not improving in 1 week. Please follow-up with your primary care provider for further evaluation and treatment. Please return to emergency department if you develop any new or worsening symptoms.
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
## 2015-12-21 ENCOUNTER — Emergency Department (HOSPITAL_BASED_OUTPATIENT_CLINIC_OR_DEPARTMENT_OTHER): Payer: Medicare Other

## 2015-12-21 ENCOUNTER — Emergency Department (HOSPITAL_BASED_OUTPATIENT_CLINIC_OR_DEPARTMENT_OTHER)
Admission: EM | Admit: 2015-12-21 | Discharge: 2015-12-21 | Disposition: A | Discharge status: Home / Self Care | Payer: Medicare Other | Attending: Emergency Medicine | Admitting: Emergency Medicine

## 2015-12-21 ENCOUNTER — Encounter (HOSPITAL_BASED_OUTPATIENT_CLINIC_OR_DEPARTMENT_OTHER): Payer: Self-pay | Admitting: *Deleted

## 2015-12-21 ENCOUNTER — Other Ambulatory Visit: Payer: Self-pay

## 2015-12-21 DIAGNOSIS — I10 Essential (primary) hypertension: Secondary | ICD-10-CM

## 2015-12-21 DIAGNOSIS — M19072 Primary osteoarthritis, left ankle and foot: Secondary | ICD-10-CM | POA: Diagnosis not present

## 2015-12-21 DIAGNOSIS — M7989 Other specified soft tissue disorders: Secondary | ICD-10-CM | POA: Insufficient documentation

## 2015-12-21 DIAGNOSIS — Z7984 Long term (current) use of oral hypoglycemic drugs: Secondary | ICD-10-CM | POA: Insufficient documentation

## 2015-12-21 DIAGNOSIS — Z79899 Other long term (current) drug therapy: Secondary | ICD-10-CM

## 2015-12-21 DIAGNOSIS — E1142 Type 2 diabetes mellitus with diabetic polyneuropathy: Secondary | ICD-10-CM

## 2015-12-21 DIAGNOSIS — J45909 Unspecified asthma, uncomplicated: Secondary | ICD-10-CM | POA: Insufficient documentation

## 2015-12-21 DIAGNOSIS — E119 Type 2 diabetes mellitus without complications: Secondary | ICD-10-CM | POA: Insufficient documentation

## 2015-12-21 DIAGNOSIS — M1711 Unilateral primary osteoarthritis, right knee: Secondary | ICD-10-CM | POA: Diagnosis not present

## 2015-12-21 DIAGNOSIS — M79604 Pain in right leg: Secondary | ICD-10-CM | POA: Diagnosis not present

## 2015-12-21 DIAGNOSIS — R52 Pain, unspecified: Secondary | ICD-10-CM

## 2015-12-21 DIAGNOSIS — M79605 Pain in left leg: Secondary | ICD-10-CM | POA: Diagnosis not present

## 2015-12-21 LAB — CBC WITH DIFFERENTIAL/PLATELET
Basophils Absolute: 0 10*3/uL (ref 0.0–0.1)
Basophils Relative: 0 %
Eosinophils Absolute: 0.3 10*3/uL (ref 0.0–0.7)
Eosinophils Relative: 2 %
HCT: 33.8 % — ABNORMAL LOW (ref 36.0–46.0)
Hemoglobin: 11.7 g/dL — ABNORMAL LOW (ref 12.0–15.0)
Lymphocytes Relative: 17 %
Lymphs Abs: 2.4 10*3/uL (ref 0.7–4.0)
MCH: 27.5 pg (ref 26.0–34.0)
MCHC: 34.6 g/dL (ref 30.0–36.0)
MCV: 79.5 fL (ref 78.0–100.0)
Monocytes Absolute: 0.7 10*3/uL (ref 0.1–1.0)
Monocytes Relative: 4 %
Neutro Abs: 11.2 10*3/uL — ABNORMAL HIGH (ref 1.7–7.7)
Neutrophils Relative %: 77 %
Platelets: 400 10*3/uL (ref 150–400)
RBC: 4.25 MIL/uL (ref 3.87–5.11)
RDW: 15.1 % (ref 11.5–15.5)
WBC: 14.6 10*3/uL — ABNORMAL HIGH (ref 4.0–10.5)

## 2015-12-21 LAB — BASIC METABOLIC PANEL
Anion gap: 5 (ref 5–15)
BUN: 15 mg/dL (ref 6–20)
CO2: 29 mmol/L (ref 22–32)
Calcium: 9.1 mg/dL (ref 8.9–10.3)
Chloride: 105 mmol/L (ref 101–111)
Creatinine, Ser: 1.07 mg/dL — ABNORMAL HIGH (ref 0.44–1.00)
GFR calc Af Amer: >60 mL/min (ref >60)
GFR calc non Af Amer: 57 mL/min — ABNORMAL LOW (ref >60)
Glucose, Bld: 186 mg/dL — ABNORMAL HIGH (ref 65–99)
Potassium: 3.5 mmol/L (ref 3.5–5.1)
Sodium: 139 mmol/L (ref 135–145)

## 2015-12-21 MED ORDER — NAPROXEN 250 MG PO TABS
500.0000 mg | ORAL_TABLET | Freq: Once | ORAL | Status: AC
  Administered 2015-12-21: 500 mg via ORAL
  Filled 2015-12-21: qty 2

## 2015-12-21 MED ORDER — HYDROCODONE-ACETAMINOPHEN 5-325 MG PO TABS
1.0000 | ORAL_TABLET | Freq: Once | ORAL | Status: AC
  Filled 2015-12-21: qty 1
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
## 2015-12-21 NOTE — ED Notes (Signed)
Patient transported to X-ray 

## 2015-12-21 NOTE — ED Notes (Addendum)
Per pt's request was asked to leave the room while pt was transferring from the wheelchair to the bed RN was present in the room

## 2015-12-21 NOTE — Patient Outreach (Signed)
[facility]) Care Management  12/21/15  Melissa AlphaSherri L Sherrow 10/01/1960 [id number]  Successful outreach completed with patient on cell phone. Patient stated that she is currently [facility] and is getting ready to be discharged. She voices concerns because she is in a lot of pain and drove herself and does not have anyone with her. When asked if she has someone who can help her, she stated that everyone is at work or busy. She asked if RNCM could help with the cost of a hotel room and RNCM attempted to inquire about home status. Patient stated that her mother is very ill and she is trying to stay away from her right now. It was very difficult to get a clear answer as to whether or not the patient has somewhere to go at discharge or not.  RNCM was able to provide education about [facility], including social work and patient requested that [facility] NOT call her back tomorrow. She stated that she has someone else who is going to help her and it is not necessary to call her. When RNCM mentioned closing her referral, she stated "No, don't do that, I need some help." Patient responses were not cohesive and she kept changing the subject. It was very difficult to get a clear understanding of what patient needs were or if she was open to [facility] help. Patient ended call because the nurse was in her room. RNCM attempted to provide her with [facility] contact information and 24 hour nurse line and patient was not receptive. RNCM advised will attempt outreach again since patient declined closing referral.  TurkeyVictoria R. Lakenya Riendeau, RN, BSN, CCM [facility] Care Management Coordinator [phone]

## 2015-12-21 NOTE — ED Triage Notes (Signed)
Pt c/o bil leg pain x 5 days [facility]

## 2015-12-21 NOTE — ED Notes (Signed)
Pt still in WC, pt is quite upset about many different things.  She is upset about her feet hurting, the treatment she received at other places, different life experiences.  Pt is tearful.

## 2015-12-21 NOTE — ED Notes (Signed)
Adjusted patient acuity due to pt requirement of staff

## 2015-12-21 NOTE — Discharge Instructions (Signed)
Your arthritis is what is causing your the pain in your feet and your right leg.  Continue anti-inflammatory medications, ice OR heat therapy, elevation of leg at rest.  Please follow-up with your orthopedic surgeon regarding further management.

## 2015-12-22 ENCOUNTER — Emergency Department (HOSPITAL_COMMUNITY)
Admission: EM | Admit: 2015-12-22 | Discharge: 2015-12-22 | Disposition: A | Discharge status: Home / Self Care | Payer: Medicare Other | Source: Home / Self Care | Attending: Emergency Medicine | Admitting: Emergency Medicine

## 2015-12-22 ENCOUNTER — Encounter (HOSPITAL_COMMUNITY): Payer: Self-pay | Admitting: *Deleted

## 2015-12-22 ENCOUNTER — Other Ambulatory Visit: Payer: Self-pay

## 2015-12-22 DIAGNOSIS — M7989 Other specified soft tissue disorders: Secondary | ICD-10-CM

## 2015-12-22 MED ORDER — FUROSEMIDE 20 MG PO TABS
20.0000 mg | ORAL_TABLET | Freq: Once | ORAL | Status: AC
  Administered 2015-12-22: 20 mg via ORAL
  Filled 2015-12-22: qty 1

## 2015-12-22 MED ORDER — IBUPROFEN 800 MG PO TABS
800.0000 mg | ORAL_TABLET | Freq: Once | ORAL | Status: DC
  Filled 2015-12-22: qty 1

## 2015-12-22 MED ORDER — ACETAMINOPHEN 500 MG PO TABS
1000.0000 mg | ORAL_TABLET | Freq: Once | ORAL | Status: AC
  Administered 2015-12-22: 1000 mg via ORAL
  Filled 2015-12-22: qty 2

## 2015-12-22 NOTE — ED Notes (Signed)
Unable to get in touch with pt daughter. Pt is poor historian and giving different information to each nurse and has no way home at this time. Will continue to contact pt daughter for discharge. Dr. Adela LankFloyd and charge nurse aware.

## 2015-12-22 NOTE — ED Notes (Addendum)
Spoke to daughter regarding patient's discharge plan. Daughter acknowledged. Stated she doesn't have a car to pick up the patient. There are no other family members or friends the daughter states that can provide a ride. Daughter requesting social worker consult.

## 2015-12-22 NOTE — ED Notes (Addendum)
Patient has told staff she cannot stand d/t her swollen feet. Though patient drove to hospital last night. While attempting to find a means for the patient to leave, patient states she can now stand and drive home. I have provided constant updates and offered tolieting to patient. Patient is upset with care, stating we have not done anything for her. I have spoke to daughter twice regarding patient's plan of care. Daughter understands plan.

## 2015-12-22 NOTE — ED Notes (Signed)
Attempted to call pt daughter again at this time

## 2015-12-22 NOTE — Consult Note (Signed)
   [facility] Consult   12/22/2015  Threasa AlphaSherri L Lowenstein 03/03/1961 [id number]  Received a referral from Durward Mallardamille, ED [facility] stating the patient is requesting to see this Clinical research associatewriter.  Chart review reveals that the patient has been contacted by Nigel MormonVictoria Satterfield, RN, [facility] Care Manager. Came by to speak with inpatient RNCM, Maralyn SagoSarah, the patient in the hall way.  Introduced self.  Patient states, "I have been calling you all day."  Explained to the patient that she had been corresponding to a different TurkeyVictoria from [facility].  She states, "well if you are not the TurkeyVictoria that I have been talking to, then I don't need to speak to you. I don't need anymore confusion."  Explained that this Clinical research associatewriter can forward information she needs.  Patient then states she has been in the ED since 11 pm last night, they have already discharged me, I need something for my feet and they offered me lotion and I don't put lotion on 'dirty' feet. I've been here since 11 pm and have not had anything to eat". She has to use the bathroom because she was given Lasix, and also, waiting for someone to go over her medications before she leaves.  The ED staff member at the desk was notified of the patient's needs by Maralyn SagoSarah.  Patient made aware that TurkeyVictoria, RN, [facility] Community Care Manager will be contacted of her request.  Asked if there was anything else she needed.  She stated, "no, I just need some help and I have called TurkeyVictoria, also." Updated Durward Mallardamille, [facility]CM, that patient will be followed up by her community nurse care Production designer, theatre/television/filmmanager.    Charlesetta ShanksVictoria Lizabeth Fellner, RN BSN CCM [facility] Liaison  [phone] business mobile phone Toll free office [phone]

## 2015-12-22 NOTE — Care Management Note (Signed)
Case Management Note  Patient Details  Name: Melissa AlphaSherri L Dickerson MRN: [id number] Date of Birth: 11/28/1960  Subjective/Objective:                  55 yo F with a chief complaint of bilateral leg swelling and pain. /From home with older children.  Action/Plan: Follow for disposition needs.   Expected Discharge Date:  12/22/15               Expected Discharge Plan:  Home/Self Care  In-House Referral:   [facility])  Discharge planning Services  CM Consult  Post Acute Care Choice:  NA Choice offered to:     DME Arranged:  N/A DME Agency:  NA  HH Arranged:  NA HH Agency:  NA  Status of Service:  Completed, signed off  If discussed at Long Length of Stay Meetings, dates discussed:    Additional Comments: I have recommended that this patient have Home Health Services but she declines at this time. I have discussed the risks and benefits of this service with Drusilla. CM informed patient that if he changed his mind, her primary care physician can make arrangements from his office.The patient verbalizes understanding.    Pt states that she is supposed to be in network with [facility] and wants to speak with them.  [facility] consulted Charlesetta ShanksVictoria Brewer, RN. Oletta CohnWood, Prinston Kynard, RN 12/22/2015, 10:52 AM

## 2015-12-22 NOTE — ED Notes (Signed)
Alford HighlandShavonti Stankus (Daughter) - [phone]  Would like to speak RN/MD caring for pt re: health. Family refused to give more details.
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
## 2015-12-22 NOTE — ED Notes (Signed)
Attempted to call pt daughter for transportation home with no answer

## 2015-12-22 NOTE — ED Notes (Signed)
Spoke to CN regarding patient's delay in discharge. Patient waiting on case management and means of transportation to get home. Patient will be place in hallway until case management has rounded.

## 2015-12-22 NOTE — Discharge Instructions (Signed)
Keep your legs elevated above the level of your heart. Do not sleep in your legs in a sitting position. Call your family physician this morning. They may want you to get an ultrasound of your heart.

## 2015-12-22 NOTE — Patient Outreach (Signed)
[facility]) Care Management  12/22/15  Melissa Chavez 07/12/1960 [id number]  12/21/15 @ 8:21 pm Received voicemail from patient requesting callback to get 800 number for [facility]. 12/21/15 @ 9:45 pm Received voicemail from patient requesting callback.  12/22/15 @ 10:55 am Received voicemail from patient stating that she thought that [facility] just [facility] past her at the hospital. She stated [facility]. Requested callback.  Patient has also called the 24 hour nurse line twice at 5:01 am and 10:18 am today complaining of her legs/feet swelling with pain.  RNCM has attempted to contact patient multiple times today without success. Phone went straight to voicemail with message stating that patient's voicemail box is full. RNCM unable to leave a message.  TurkeyVictoria R. Daaiel Starlin, RN, BSN, CCM [facility] Care Management Coordinator [phone]

## 2015-12-22 NOTE — ED Triage Notes (Signed)
Patient presents with c/o swollen feet - unable to get a real story from the patient.  Has numerous c/o such as she hurts all over from her arthritis and it getting colder.  Daughter was asked to step out due to talking loud to each other.

## 2015-12-22 NOTE — Discharge Planning (Signed)
[facility]. EDCM contact SW for transportation support.

## 2015-12-22 NOTE — ED Notes (Signed)
Case management at bedside.

## 2015-12-22 NOTE — ED Notes (Signed)
Patient physically moved to hallway A2; awaiting ride

## 2015-12-22 NOTE — ED Provider Notes (Signed)
[facility] DEPT Provider Note   CSN: [id number] Arrival date & time: 12/21/15  2352     History   Chief Complaint Chief Complaint  Patient presents with  . Feet Swelling    HPI Melissa Chavez is a 55 y.o. female.  55 yo F with a chief complaint of bilateral leg swelling and pain. This been going on for the past week or so. She has been seen twice in the ED prior to this visit. Was initially complaining of pain to the right ankle.complaining to pain and bilateral lower extremities. She denies any injury. Denies fevers or chills. Denies chest pain or shortness of breath. Denies exertional symptoms. She now feels that she is having difficulty walking due to pain to the bilateral feet. She has tried elevating her feet but has had limited improvement. Called her family physician who suggested she come to the ED immediately.   The history is provided by the patient.  Leg Pain   This is a new problem. The current episode started more than 2 days ago. The problem occurs constantly. The problem has not changed since onset.The pain is present in the left foot and right foot. The quality of the pain is described as aching and sharp. The pain is at a severity of 7/10. The pain is severe. Associated symptoms include stiffness. Pertinent negatives include no numbness and full range of motion. She has tried nothing for the symptoms. The treatment provided no relief.    Past Medical History:  Diagnosis Date  . Allergy   . Anemia   . Asthma   . Bipolar 2 disorder ([place])   . Diabetes mellitus without complication ([place])   . GERD (gastroesophageal reflux disease)   . Glaucoma    Pt denies  . Hernia   . Hypertension     Patient Active Problem List   Diagnosis Date Noted  . Sinus congestion 11/29/2015  . Bilateral leg edema 09/25/2015  . Abdominal pain, left upper quadrant 09/25/2015  . 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 Prednisone adverse reaction 08/20/2015    . Abdominal pain, chronic, epigastric 06/30/2015  . Osteoarthritis of right foot 03/22/2015  . Type 2 diabetes mellitus with diabetic polyneuropathy, without long-term current use of insulin ([place]) 03/20/2015  . BMI 40.0-44.9, adult ([place]) 06/13/2013  . Overactive bladder 06/13/2013  . Allergic rhinitis 06/13/2013  . Asthma, moderate persistent 06/13/2013  . BIPOLAR DISORDER UNSPECIFIED 03/18/2008  . PERIPHERAL EDEMA 07/03/2007  . GERD 01/30/2007  . HYPERCHOLESTEROLEMIA 12/07/2006  . Essential hypertension 12/07/2006  . Osteoarthritis of right knee 12/07/2006  . ANEMIA, IRON DEFICIENCY 01/06/2006    Past Surgical History:  Procedure Laterality Date  . HERNIA REPAIR    . TONSILLECTOMY     Pt denies  . TUBAL LIGATION      OB History    No data available       Home Medications    Prior to Admission medications   Medication Sig Start Date End Date Taking? Authorizing Provider  ACCU-CHEK SOFTCLIX LANCETS lancets Test blood sugar once daily. Dx: E11.42 10/21/15   Wardell Honour, MD  ADVAIR DISKUS 250-50 MCG/DOSE AEPB Inhale 1 puff by mouth into the lungs two times daily 07/01/15   Shawnee Knapp, MD  Blood Glucose Calibration (ACCU-CHEK AVIVA) SOLN Test blood sugar once daily. Dx: E11.42 10/21/15   Wardell Honour, MD  blood glucose meter kit and supplies KIT Use as directed 4 times a day.  DX:  R73.09 06/13/14   Shawnee Knapp, MD  Blood Glucose Monitoring Suppl (ACCU-CHEK AVIVA PLUS) w/Device KIT Test blood sugar once daily. Dx: E11.42 10/21/15   Wardell Honour, MD  cetirizine (ZYRTEC ALLERGY) 10 MG tablet Take 1 tablet (10 mg total) by mouth daily. 11/29/15   Montine Circle, PA-C  colchicine 0.6 MG tablet Take 2 tablets with onset of gout, then 1 tablet every 12 hours until gout flare resolves Patient taking differently: Take 0.12 mg by mouth daily as needed (gout).  09/14/15   Chelle Jeffery, PA-C  diclofenac sodium (VOLTAREN) 1 % GEL Apply 4 g topically 2 (two) times daily. Patient taking  differently: Apply 4 g topically 2 (two) times daily as needed (pain).  06/30/15   Darlyne Russian, MD  fluticasone (FLONASE) 50 MCG/ACT nasal spray PLACE 2 SPRAYS INTO BOTH NOSTRILS AT BEDTIME. Patient not taking: Reported on 10/09/2015 07/28/15   Shawnee Knapp, MD  glucose blood (ACCU-CHEK AVIVA PLUS) test strip Test blood sugar once daily. Dx: E11.42 10/21/15   Wardell Honour, MD  hydrOXYzine (ATARAX/VISTARIL) 25 MG tablet Take 25 mg by mouth every 6 (six) hours as needed for anxiety or itching.  08/24/15   Historical Provider, MD  ibuprofen (ADVIL,MOTRIN) 400 MG tablet Take 1 tablet (400 mg total) by mouth every 6 (six) hours as needed. 11/29/15   Montine Circle, PA-C  ipratropium (ATROVENT HFA) 17 MCG/ACT inhaler Inhale 2 puffs into the lungs every 4 (four) hours as needed for wheezing. 08/05/15   April Palumbo, MD  lisinopril (PRINIVIL,ZESTRIL) 40 MG tablet Take 1 tablet by mouth  daily 10/30/15   Wardell Honour, MD  meloxicam (MOBIC) 15 MG tablet Take 15 mg by mouth daily.  08/17/15   Historical Provider, MD  metFORMIN (GLUCOPHAGE) 1000 MG tablet Take 1 tablet (1,000 mg total) by mouth 2 (two) times daily with a meal. 05/12/15   Shawnee Knapp, MD  oxybutynin (DITROPAN XL) 15 MG 24 hr tablet Take 1 tablet by mouth at  bedtime 10/30/15   Wardell Honour, MD  polyethylene glycol powder (MIRALAX) powder Take 17 g by mouth daily. 10/14/15   April Palumbo, MD  pravastatin (PRAVACHOL) 40 MG tablet Take 1 tablet by mouth  daily 10/30/15   Wardell Honour, MD  sucralfate (CARAFATE) 1 GM/10ML suspension Take 10 mLs (1 g total) by mouth 4 (four) times daily -  with meals and at bedtime. 10/14/15   April Palumbo, MD  tiZANidine (ZANAFLEX) 4 MG tablet TAKE 1 TABLET (4 MG TOTAL) BY MOUTH AT BEDTIME. 05/12/15   Shawnee Knapp, MD  triamterene-hydrochlorothiazide (MAXZIDE-25) 37.5-25 MG tablet Take 1 tablet by mouth daily.  07/20/15   Historical Provider, MD  VENTOLIN HFA 108 (90 Base) MCG/ACT inhaler Inhale 2 puffs by mouth  every 6 hours as  needed for wheezing 07/25/15   Shawnee Knapp, MD  ziprasidone (GEODON) 40 MG capsule Take 40 mg by mouth daily. 09/22/15   Historical Provider, MD    Family History Family History  Problem Relation Age of Onset  . Hypertension Mother   . Diabetes Mother   . Hyperlipidemia Mother   . Stroke Mother   . Heart disease Mother   . Pulmonary embolism Mother   . Hypertension Father   . Diabetes Father   . Hyperlipidemia Father     Social History Social History  Substance Use Topics  . Smoking status: Never Smoker  . Smokeless tobacco: Never Used  . Alcohol use  0.0 oz/week     Comment: Wine, Socially      Allergies   Fish allergy; Banana; Other; and Prednisone   Review of Systems Review of Systems  Constitutional: Negative for chills and fever.  HENT: Negative for congestion and rhinorrhea.   Eyes: Negative for redness and visual disturbance.  Respiratory: Negative for shortness of breath and wheezing.   Cardiovascular: Positive for leg swelling. Negative for chest pain and palpitations.  Gastrointestinal: Negative for nausea and vomiting.  Genitourinary: Negative for dysuria and urgency.  Musculoskeletal: Positive for stiffness. Negative for arthralgias and myalgias.  Skin: Negative for pallor and wound.  Neurological: Negative for dizziness, numbness and headaches.     Physical Exam Updated Vital Signs BP 152/89 (BP Location: Left Arm)   Pulse 90   LMP 05/30/2013   SpO2 99%   Physical Exam  Constitutional: She is oriented to person, place, and time. She appears well-developed and well-nourished. No distress.  HENT:  Head: Normocephalic and atraumatic.  Eyes: EOM are normal. Pupils are equal, round, and reactive to light.  Neck: Normal range of motion. Neck supple.  Cardiovascular: Normal rate and regular rhythm.  Exam reveals no gallop and no friction rub.   No murmur heard. Pulmonary/Chest: Effort normal. She has no wheezes. She has no rales.  Abdominal: Soft. She  exhibits no distension. There is no tenderness.  Musculoskeletal: She exhibits edema (3+ to the knees bilaterally). She exhibits no tenderness.  Neurological: She is alert and oriented to person, place, and time.  Skin: Skin is warm and dry. She is not diaphoretic.  Psychiatric: She has a normal mood and affect. Her behavior is normal.  Nursing note and vitals reviewed.    ED Treatments / Results  Labs (all labs ordered are listed, but only abnormal results are displayed) Labs Reviewed - No data to display  EKG  EKG Interpretation None       Radiology Dg Foot Complete Left  Result Date: 12/21/2015 CLINICAL DATA:  Acute onset of bilateral foot pain and swelling. Initial encounter. EXAM: LEFT FOOT - COMPLETE 3+ VIEW COMPARISON:  None. FINDINGS: There is no evidence of fracture or dislocation. Note is made of joint space narrowing at the first metatarsophalangeal joint, with prominent dorsal osteophyte formation and an associated degenerative fragment. Overlying soft tissue swelling is noted. A plantar calcaneal spur is incidentally seen. An os subfibulare is noted. There is no evidence of talar subluxation; the subtalar joint is unremarkable in appearance. No additional soft tissue abnormalities are seen. IMPRESSION: 1. No evidence of fracture or dislocation. 2. Joint space narrowing at the first metatarsophalangeal joint, with prominent dorsal osteophyte formation and associated degenerative fragment, likely reflecting osteoarthritis. 3. Os subfibulare noted. Electronically Signed   By: Garald Balding M.D.   On: 12/21/2015 16:38   Dg Hip Unilat With Pelvis Min 4 Views Right  Result Date: 12/21/2015 CLINICAL DATA:  Acute onset of bilateral leg pain, worse on the right. Bilateral foot pain and swelling. Initial encounter. EXAM: DG HIP (WITH OR WITHOUT PELVIS) 4+V RIGHT COMPARISON:  None. FINDINGS: There is no evidence of fracture or dislocation. Both femoral heads are seated normally  within their respective acetabula. The proximal right femur appears intact. No significant degenerative change is appreciated. The sacroiliac joints are unremarkable in appearance. The visualized bowel gas pattern is grossly unremarkable in appearance. Scattered phleboliths are noted within the pelvis. IMPRESSION: No evidence of fracture or dislocation. Electronically Signed   By: Francoise Schaumann.D.  On: 12/21/2015 16:36    Procedures Procedures (including critical care time)  Medications Ordered in ED Medications  ibuprofen (ADVIL,MOTRIN) tablet 800 mg (800 mg Oral Refused 12/22/15 0506)  furosemide (LASIX) tablet 20 mg (20 mg Oral Given 12/22/15 0453)  acetaminophen (TYLENOL) tablet 1,000 mg (1,000 mg Oral Given 12/22/15 0453)     Initial Impression / Assessment and Plan / ED Course  I have reviewed the triage vital signs and the nursing notes.  Pertinent labs & imaging results that were available during my care of the patient were reviewed by me and considered in my medical decision making (see chart for details).  Clinical Course    55 yo F With a chief complaint of bilateral leg swelling. This appears to be dependent edema. Patient one point stated that she had been sleeping in a chair because she had had back pain. I suspect this is the cause of her symptoms. She has no rales no JVD clear lungs. Patient is requesting a dose of Lasix.  Upon discussing discharge with the patient and she stated that she is unable to leave until least 10:00 in the morning. She was unable to give a great reason why this is. We have been unable to contact her daughter dropped her off in the ED. As the patient is refusing to walk it makes it difficult for [place] to place her in the waiting room. Will continue to try to attempt to reach the daughter. If unable will likely need social work to help.  6:20 AM:  I have discussed the diagnosis/risks/treatment options with the patient and believe the pt to be  eligible for discharge home to follow-up with PCP. We also discussed returning to the ED immediately if new or worsening sx occur. We discussed the sx which are most concerning (e.g., sudden worsening sob, chest pain, syncope) that necessitate immediate return. Medications administered to the patient during their visit and any new prescriptions provided to the patient are listed below.  Medications given during this visit Medications  ibuprofen (ADVIL,MOTRIN) tablet 800 mg (800 mg Oral Refused 12/22/15 0506)  furosemide (LASIX) tablet 20 mg (20 mg Oral Given 12/22/15 0453)  acetaminophen (TYLENOL) tablet 1,000 mg (1,000 mg Oral Given 12/22/15 0453)     The patient appears reasonably screen and/or stabilized for discharge and I doubt any other medical condition or other [facility] requiring further screening, evaluation, or treatment in the ED at this time prior to discharge.    Final Clinical Impressions(s) / ED Diagnoses   Final diagnoses:  Leg swelling    New Prescriptions New Prescriptions   No medications on file     Deno Etienne, DO 12/22/15 6754

## 2015-12-23 ENCOUNTER — Other Ambulatory Visit: Payer: Self-pay

## 2015-12-23 NOTE — Patient Outreach (Addendum)
[facility]) Care Management  12/23/15  Melissa Chavez 08/01/1960 [id number]  Attempted to reach Dr. Renne CriglerMulberry's office without success. Left voicemail with RNCM contact information and patient information and advised calling to try to schedule an appointment for patient to be seen. Requested callback.  RNCM attempted to contact patient without success. Left voicemail updating patient that RNCM had contacted Dr. Renne CriglerMulberry's office and left a voicemail. Awaiting callback. Will notify her once I have more information. Encouraged to call with any questions or concerns.  TurkeyVictoria R. Vonette Grosso, RN, BSN, CCM [facility] Care Management Coordinator [phone]

## 2015-12-24 ENCOUNTER — Other Ambulatory Visit: Payer: Self-pay | Admitting: Licensed Clinical Social Worker

## 2015-12-24 ENCOUNTER — Other Ambulatory Visit: Payer: Self-pay

## 2015-12-24 NOTE — Patient Outreach (Addendum)
[facility]) Care Management  12/24/15  Threasa AlphaSherri L Jenning 03/11/1960 [id number]  RNCM received voicemail from patient today at 10:37 am stating that she needs to speak with a nurse and requested callback.  RNCM attempted to return call to patient without success. Phone went straight to voicemail and RNCM unable to leave a message due to mailbox being full.  ADDENDUM: Patient returned call and RNCM spoke with patient. Patient identification verified.   Patient stated that she did not call nurse line because she has one through [facility]. Patient voiced concerns about why [facility] was reaching out. She stated that she was told in the emergency room [facility] she had been there in the ED 10 times in the last 2 months. She stated that the reason she went to the emergency room the last time was because someone "from your agency told me to go right now." RNCM was able to verify this occurred when she contacted the 24 hour nurse line regarding her complaints of pain in her feet.  RNCM attempted to follow up with patient regarding SW referral and she stated that she does not remember talking to anyone [facility] today and does not remember anyone by the name of Nehemiah SettleBrooke calling her. She continued to voice frustration with calls, stating, "I cannot answer every call from ya'll." She stated that sometimes she does not hear the phone ring, and sometimes she is trying to rest. Patient sounded very frustrated when discussing multiple calls to her and RNCM attempted to assess patient's frustration and she stated "Do I sound frustrated? I guess the next thing you are going to do is close my case?" RNCM educated patient regarding [facility] program and advised that it is a voluntary program. Patient interrupted RNCM and stated "You mean to tell me that you all volunteer to work for this organization?" RNCM continued to educate patient with  regards to the program being voluntary and advised that this means she has the right to choose if she wants to participate in the program and that the staff of [facility] are employed and not volunteers. As RNCM was attempted to talk to the patient about the kinds of support and help [facility] could provide, she interrupted the [facility] and stated that "No one will help me so they referred me to you all. But I don't have time to talk to you every time you call me. I have got to get some rest and I am going to hang up the phone. Bye." And the patient ended the call. PLAN: RNCM will follow through with attempt to determine if patient has a PCP. RNCM will hold off on attempt to reach out to patient until next week or until she calls RNCM back.   TurkeyVictoria R. Recie Cirrincione, RN, BSN, CCM [facility] Care Management Coordinator [phone]

## 2015-12-24 NOTE — Patient Outreach (Addendum)
[facility]) Care Management  Late entry for 12/23/2015   Melissa Chavez January 12, 1961 [id number]  Successful outreach completed with patient. Patient identification verified. Patient initiated conversation with "What can you do for me?" RNCM explained [facility] Care Management Services and role of RNCM. RNCM educated patient regarding reason for reaching out is referral from the emergency department to assist patient with any coordination of care. Patient was very defensive, stating that RNCM had tried to get the patient off the phone during our previous conversation when she was in the ED and that RNCM had threatened to close her case. RNCM explained that patient had told RNCM that the nurse was in her room and that patient was trying to have conversations with RNCM and nurse in the ED at the same time and that patient had asked to end call because the nurse was in her room trying to discharge her. Also stated that patient had asked RNCM not to call her back because she stated she already had someone helping her and that is why I mentioned closing her referral. RNCM assured patient that her case had not been closed and RNCM would be willing to assist her with her needs. Patient proceeds to confirm that she has been to the emergency department several times recently, but reports it is because "no one is doing anything to help me." She proceeds to complain about nurses and how every ED was just trying to kick her out. She stated that no one has listened to her about her feet and that she cannot walk. RNCM attempted to address if patient has a PCP and got upset and continued to talk about the staff in the ED. RNCM explained that in order to help her, we would need to address some basic questions so that RNCM would know how to best help her get the care she needed. RNCM continued to ask about a PCP and if the patient has ever seen a specialist for her edema and foot problems and the patient became  frustrated and asked if RNCM was a nurse with a license and what school I attended. RNCM reassured patient that RNCM was a "registered nurse" and that I had received a degree in nursing as she asked this several time during the phone conversation. Patient apologized and stated she was not asking to question nurse's "degree" but was considering a career change and was wondering where RNCM went to school.  It was very difficult to get any facts from patient. Her statements were all over the place and she continued to complain about her recent ED visits and how she still has swelling in her feet and cannot bear any weight and no one is listening to her. After talking with patient for approximately 50 minutes, was able to determine that patient currently does not have a PCP. She stated she was seeing Porfirio Oar at [facility] but has not been able to return because she owes a balance there. She stated that she used to see Dr. Julieanne Manson and has listed her as a PCP, but has not actually scheduled an appointment there. She stated that she does not even know if Dr. Delrae Alfred is taking new patients. RNCM offered to contact Dr. Delrae Alfred to schedule an appointment and patient was reluctant at first, but did agree to allow [facility] to contact her. RNCM educated patient about importance of having a PCP to help manage all of her care.   Patient also reported that she is  in the process of getting an apartment, but it will not be ready until November 1. She reported that she is currently staying with friends part of the time and in a hotel at other times. She asked if RNCM could pay for her a hotel room until November 1 and [facility] educated that typically, we do not cover hotel rooms. Patient has had a safe place to stay up to now, but is requesting assistance with housing until she can move into her apartment on November 1. Patient agreeable to SW consult. She wanted the SW phone number, but RNCM advised not sure who  would be assigned and would let her know who that is as soon as it is assigned. Patient agreeable.   Patient also verbalized concerns that all of her medications were not accurate and that she is on medications that she should not be on. RNCM offered to make a referral [facility] and patient stated that she already had a pharmacy. RNCM educated that the role of [facility] and that they do not dispense medications. Educated that they could do a home visit and look at everything she is taking and reconcile it.  Patient became upset and stated "Why does everyone push home visits so much?" She proceeded to state that someone had recommended home health therapy for her and she declined because they did not do anything for her mother when she had them. She stated that she felt that all they did when they came into her mother's home was "be nosy." She stated that they would come in and move her mother's furniture around and she did not like it. RNCM advised that we do not have to do any home visits if it makes patient uncomfortable and patient was agreeable with a referral to pharmacy.   Patient provided with RNCM contact information and encouraged to call back with any questions or concerns. RNCM will continue to try to complete initial assessment on next outreach. Patient is agreeable with this plan:  RNCM to reach out to Dr. Delrae AlfredMulberry to attempt to schedule an appointment RNCM to make referral to SW to assist with temporary housing needs RNCM to make referral to pharmacy to assist with medication concerns  TurkeyVictoria R. Dannilynn Gallina, RN, BSN, CCM [facility] Care Management Coordinator [phone]

## 2015-12-24 NOTE — Patient Outreach (Signed)
[facility](THN) Care Management  12/24/2015  Melissa Chavez 11/05/1960 [id number]   Assessment- CSW [facility]. Patient is in need of housing resources. [facility] that she is getting a new apartment but cannot move in until 01/06/16 and does not have a stable place to reside. Patient reported to [facility] that she is staying in hotels and with friends.   CSW completed initial outreach to patient. CSW unable to reach patient successfully and HIPPA compliant voice message was left.  CSW received return call from patient. CSW answered and introduced self, reason for call and of [facility] services. Patient immediately became agitated with CSW. Patient has a history of bipolar with manic symptoms. She has a history of starting conflicts and being disrespectful to hospital staff per chart review. She reports "I don't think you all listen well. I did not tell her I had housing problems. Why do you keep saying housing? Why are you calling me?" CSW explained that she would like to assist her with providing housing resources if she is having difficulty finding stable housing. Patient states "Why would I call her and tell her that when I don't have housing problems?" Patient reports "I don't think this is the best program for me. You guys are the one who called me while I was in the hospital. Are you guys a new program because you guys are causing me drama and confusion that I don't need." CSW had difficulty saying much at all during entire conversation. CSW made multiple efforts to explain to patient that we are here to help her with her health care needs and that CSW is contacting her because she had reported housing issues to [facility] and CSW wished to assist her with any social work needs. CSW questioned if she needed mental health resources and patient stated "I do not have any mental health issues so why would I need your help."  Patient became very upset during this part of the conversation when mental health was brought up. CSW had a difficult time understanding what her exact needs are because she continues to revert back to conversation about her [facility] and other healthcare providers. Patient continued to go off on tangents complaining about medical providers. Patient stated that she only wishes to talk to someone about her health and her medications and does not need social work assistance.   CSW contacted [facility] and discussed case.   Plan-CSW will not open case as patient has refused social work services. CSW informed RNCM that case will not be opened by social work.  Dickie LaBrooke Sully Dyment, BSW, MSW, LCSW [facility] Care Management [email or web address] Phone: [phone] Fax: [phone]

## 2016-01-05 ENCOUNTER — Other Ambulatory Visit: Payer: Self-pay

## 2016-01-12 ENCOUNTER — Other Ambulatory Visit: Payer: Self-pay

## 2016-01-12 NOTE — Telephone Encounter (Signed)
09/2015 last ov 

## 2016-01-13 NOTE — Telephone Encounter (Deleted)
See phone message. Patient rescheduled visit from 11/07 to 11/13. Printed Rxs, one to be picked up yesterday (it was) and the others to be placed in the pick-up drawer for her when she comes in on 11/13 for her visit.

## 2016-01-14 NOTE — Telephone Encounter (Signed)
Where is she getting primary care? If it's still with me, I need to see her.  If she's establishing elsewhere, she should request from there. OK to authorize a 30-day supply of requested medications to allow time for her to come in.

## 2016-01-15 MED ORDER — TIZANIDINE HCL 4 MG PO TABS: ORAL_TABLET | ORAL | 0 refills | Status: DC

## 2016-01-15 MED ORDER — VENTOLIN HFA 108 (90 BASE) MCG/ACT IN AERS: INHALATION_SPRAY | RESPIRATORY_TRACT | 0 refills | Status: DC

## 2016-01-15 NOTE — Telephone Encounter (Signed)
[facility] Chelle's message and advised I am sending in a 30 day supply of ea med to give her time to call here for appt to see Chelle or get in touch with her new PCP if she is going elsewhere.

## 2016-01-20 NOTE — Patient Outreach (Signed)
[facility](THN) Care Management  Late entry for 01/05/2016  Melissa Chavez 07/27/1960 [id number]  Successful contact made with patient. Patient identification verified. Patient continues to voice confusion about [facility]. She stated that [facility] and she does not understand why. She said she understands it has to do with her ER visits, but stated she is not sure why. RNCM has attempted multiple times to educate patient and assess for needs without success. RNCM attempted to discuss PCP status with patient and she confirmed that she will not be going back to Dr. Norberto SorensonEva Shaw. RNCM advised she had contacted Dr. Delrae AlfredMulberry and was advised that patient lived outside their geographical area of practice and cannot be seen there. She stated that she had not planned to go there. Patient stated that she does not have a primary care provider.  RNCM provided education to patient about the program requirements and advised that without a PCP in the [facility] network, she is not eligible for [facility] services. RNCM had attempted to help patient with a PCP, but patient unable to secure a PCP. RNCM unsure if patient is unable or unwilling to secure a PCP.  Advised RNCM will no longer be making outreaches and patient is agreeable.   Melissa R. Julya Alioto, RN, BSN, CCM [facility] Care Management Coordinator [phone]

## 2016-01-20 NOTE — Telephone Encounter (Signed)
This encounter was created in error - please disregard.

## 2016-01-27 ENCOUNTER — Ambulatory Visit: Payer: Self-pay | Admitting: Neurology

## 2016-01-28 ENCOUNTER — Emergency Department (HOSPITAL_COMMUNITY): Payer: Medicare Other

## 2016-01-28 ENCOUNTER — Encounter (HOSPITAL_COMMUNITY): Payer: Self-pay

## 2016-01-28 DIAGNOSIS — Z7984 Long term (current) use of oral hypoglycemic drugs: Secondary | ICD-10-CM | POA: Insufficient documentation

## 2016-01-28 DIAGNOSIS — E119 Type 2 diabetes mellitus without complications: Secondary | ICD-10-CM | POA: Insufficient documentation

## 2016-01-28 DIAGNOSIS — J45909 Unspecified asthma, uncomplicated: Secondary | ICD-10-CM | POA: Diagnosis not present

## 2016-01-28 DIAGNOSIS — M109 Gout, unspecified: Secondary | ICD-10-CM | POA: Diagnosis not present

## 2016-01-28 DIAGNOSIS — R0602 Shortness of breath: Secondary | ICD-10-CM | POA: Diagnosis not present

## 2016-01-28 DIAGNOSIS — F4320 Adjustment disorder, unspecified: Secondary | ICD-10-CM | POA: Diagnosis not present

## 2016-01-28 DIAGNOSIS — R079 Chest pain, unspecified: Secondary | ICD-10-CM | POA: Diagnosis not present

## 2016-01-28 DIAGNOSIS — I10 Essential (primary) hypertension: Secondary | ICD-10-CM | POA: Diagnosis not present

## 2016-01-28 DIAGNOSIS — M79675 Pain in left toe(s): Secondary | ICD-10-CM | POA: Diagnosis present

## 2016-01-28 LAB — COMPREHENSIVE METABOLIC PANEL
ALT: 20 U/L (ref 14–54)
AST: 27 U/L (ref 15–41)
Albumin: 2.8 g/dL — ABNORMAL LOW (ref 3.5–5.0)
Alkaline Phosphatase: 152 U/L — ABNORMAL HIGH (ref 38–126)
Anion gap: 13 (ref 5–15)
BUN: 11 mg/dL (ref 6–20)
CO2: 26 mmol/L (ref 22–32)
Calcium: 9.3 mg/dL (ref 8.9–10.3)
Chloride: 95 mmol/L — ABNORMAL LOW (ref 101–111)
Creatinine, Ser: 1.12 mg/dL — ABNORMAL HIGH (ref 0.44–1.00)
GFR calc Af Amer: >60 mL/min (ref >60)
GFR calc non Af Amer: 54 mL/min — ABNORMAL LOW (ref >60)
Glucose, Bld: 287 mg/dL — ABNORMAL HIGH (ref 65–99)
Potassium: 3.5 mmol/L (ref 3.5–5.1)
Sodium: 134 mmol/L — ABNORMAL LOW (ref 135–145)
Total Bilirubin: 0.5 mg/dL (ref 0.3–1.2)
Total Protein: 8.1 g/dL (ref 6.5–8.1)

## 2016-01-28 LAB — CBC WITH DIFFERENTIAL/PLATELET
Basophils Absolute: 0 10*3/uL (ref 0.0–0.1)
Basophils Relative: 0 %
Eosinophils Absolute: 0.2 10*3/uL (ref 0.0–0.7)
Eosinophils Relative: 1 %
HCT: 32.5 % — ABNORMAL LOW (ref 36.0–46.0)
Hemoglobin: 11.2 g/dL — ABNORMAL LOW (ref 12.0–15.0)
Lymphocytes Relative: 15 %
Lymphs Abs: 2.7 10*3/uL (ref 0.7–4.0)
MCH: 27.1 pg (ref 26.0–34.0)
MCHC: 34.5 g/dL (ref 30.0–36.0)
MCV: 78.7 fL (ref 78.0–100.0)
Monocytes Absolute: 0.9 10*3/uL (ref 0.1–1.0)
Monocytes Relative: 5 %
Neutro Abs: 14.2 10*3/uL — ABNORMAL HIGH (ref 1.7–7.7)
Neutrophils Relative %: 79 %
Platelets: 533 10*3/uL — ABNORMAL HIGH (ref 150–400)
RBC: 4.13 MIL/uL (ref 3.87–5.11)
RDW: 15.4 % (ref 11.5–15.5)
WBC: 18 10*3/uL — ABNORMAL HIGH (ref 4.0–10.5)

## 2016-01-28 NOTE — ED Triage Notes (Signed)
Pt states that she is having a gout flare up, c/o L knee and L elbow pain, pt states that she is mental and physical pain, denies SI/HI/AVH, pt states she is stressed and had a lot going on this year, c/o cough, R rib pain, nausea, diarrhea for 3-4 days, dry mouth, SOB from asthma uncontrolled and wants a mental evaluation.

## 2016-01-28 NOTE — ED Notes (Addendum)
Pt daughter came to tech first and states her mother has to go to the bath room. Pt was in a wheel chair. Pt is taken to a bathroom and she ask this tech to get the pants from her daughter. This tech id then ask for some wet wipes. This tech explains we don't have wet wipes but I can get her some wash cloths we can wet and put soap on. Pt states she has sensitive skin. This tech gets pt wash cloths that I wet and place soap in it. Pt states she needs to use the bathroom but she is in too much pain to stand up. She will wait until she goes to a room and can use a bed pan. Pt is assisted into pants and pulled up her legs to her hips. Pt states this is ok and places a blanket she has with her around her hips. Pt is given a warm blanket.

## 2016-01-29 ENCOUNTER — Emergency Department (HOSPITAL_COMMUNITY)
Admission: EM | Admit: 2016-01-29 | Discharge: 2016-01-29 | Disposition: A | Discharge status: Home / Self Care | Payer: Medicare Other | Attending: Emergency Medicine | Admitting: Emergency Medicine

## 2016-01-29 DIAGNOSIS — M109 Gout, unspecified: Secondary | ICD-10-CM

## 2016-01-29 DIAGNOSIS — F4320 Adjustment disorder, unspecified: Secondary | ICD-10-CM

## 2016-01-29 MED ORDER — SODIUM CHLORIDE 0.9 % IV BOLUS (SEPSIS)
1000.0000 mL | Freq: Once | INTRAVENOUS | Status: AC
  Administered 2016-01-29: 1000 mL via INTRAVENOUS

## 2016-01-29 MED ORDER — KETOROLAC TROMETHAMINE 30 MG/ML IJ SOLN
30.0000 mg | Freq: Once | INTRAMUSCULAR | Status: AC
  Administered 2016-01-29: 30 mg via INTRAVENOUS
  Filled 2016-01-29: qty 1

## 2016-01-29 MED ORDER — COLCHICINE 0.6 MG PO TABS
0.6000 mg | ORAL_TABLET | Freq: Once | ORAL | Status: AC
  Administered 2016-01-29: 0.6 mg via ORAL
  Filled 2016-01-29: qty 1

## 2016-01-29 MED ORDER — LOPERAMIDE HCL 2 MG PO CAPS
2.0000 mg | ORAL_CAPSULE | Freq: Once | ORAL | Status: AC
  Administered 2016-01-29: 2 mg via ORAL
  Filled 2016-01-29: qty 1

## 2016-01-29 MED ORDER — MELOXICAM 7.5 MG PO TABS
15.0000 mg | ORAL_TABLET | Freq: Once | ORAL | Status: AC
  Administered 2016-01-29: 15 mg via ORAL
  Filled 2016-01-29: qty 2

## 2016-01-29 MED ORDER — MELOXICAM 15 MG PO TABS: 15.0000 mg | ORAL_TABLET | Freq: Every day | ORAL | 0 refills | Status: DC

## 2016-01-29 MED ORDER — LOPERAMIDE HCL 2 MG PO CAPS: 2.0000 mg | ORAL_CAPSULE | Freq: Four times a day (QID) | ORAL | 0 refills | Status: DC | PRN

## 2016-01-29 MED ORDER — FENTANYL CITRATE (PF) 100 MCG/2ML IJ SOLN
75.0000 ug | Freq: Once | INTRAMUSCULAR | Status: AC
  Administered 2016-01-29: 75 ug via INTRAVENOUS
  Filled 2016-01-29: qty 2

## 2016-01-29 MED ORDER — COLCHICINE 0.6 MG PO TABS: ORAL_TABLET | ORAL | 0 refills | Status: DC

## 2016-01-29 NOTE — ED Notes (Addendum)
This RN has attempted to satisfy the pt by several means. The RN as well as the PA have cooperated with the pt on her every request. Every time this RN suggested an option the pt refused/ignored my attempt. During discharge this RN administered medication for gout per the pt request. The pt then laughed and states "so y'all are going to kick me out after I take this medication and its going to give me diarrhea." This RN informed the pt that she could ask the PA about getting an order for imodium for diarrhea. The pt ignored this RN request and continued to voice her feelings. This RN listened closely. The pt later repeated herself about the diarrhea and the RN reminded her that she offered to ask the PA about the imodium. The pt the responded " what good is a prescription going to do for me." This RN informed the pt that she was talking about a medication for the diarrhea at this time and that she would see about getting a prescription." The pt verbalized the understanding. This RN offered on several occasions to assist the pt with getting dressed as well as the EMT. The pt stated "What do you leave at 7 and Melissa Chavez must leave at 7 too." This RN informed the pt that "this was true but neither the RN nor the EMT was rushing the pt and that we were simply offering assistance if needed and wanted." The pt then responded "I feel exactly the same as I did when I came in, my elbow still hurts, and my hip, and my feet are still swollen."   On several different occasions the RN offered to have the PA come back in the room and discuss the plan of care with her. Pt responded "Well Melissa Chavez, if you don't want to do it then you can leave its fine." RN informed that it was not the case and verbalized that she felt as if she was not satisfying the pts needs and was offering further assistance from the PA.   Pt states "Is the DR not even going to come in here?" Pt informed that her the PA informs the MD of all choices in her  care. PA informed of pt statement.  RN informed the Charge RN that pt was unsatisfied and made aware of the situation. MD and PA made aware of the situation.

## 2016-01-29 NOTE — ED Provider Notes (Signed)
[facility] DEPT Provider Note   CSN: [id number] Arrival date & time: 01/28/16  2050  History   Chief Complaint Chief Complaint  Patient presents with  . multiple complaints   HPI Melissa ASCHENBRENNER is a 55 y.o. female. HPI   Patient is here for multiple complaints. She reports having a gout pain flair to her toe as well as left knee and left elbow pain without injury, no swelling redness or drainage. She also is requesting a mental evaluation for mental and physical pain, two of her aunts recently died. She has PMH of asthma, bipolar disorder, diabetes, GERD, glaucoma, hernia, hypertension, allergies. She has had a lot going on this year and is stressed. She is also having dry cough for 3 days, right side rib pain, diarrhea for 3-4 days, and SOB from asthma.  Past Medical History:  Diagnosis Date  . Allergy   . Anemia   . Asthma   . Bipolar 2 disorder ([place])   . Diabetes mellitus without complication ([place])   . GERD (gastroesophageal reflux disease)   . Glaucoma    Pt denies  . Hernia   . Hypertension     Patient Active Problem List   Diagnosis Date Noted  . Sinus congestion 11/29/2015  . Bilateral leg edema 09/25/2015  . Abdominal pain, left upper quadrant 09/25/2015  . 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 Prednisone adverse reaction 08/20/2015  . Abdominal pain, chronic, epigastric 06/30/2015  . Osteoarthritis of right foot 03/22/2015  . Type 2 diabetes mellitus with diabetic polyneuropathy, without long-term current use of insulin ([place]) 03/20/2015  . BMI 40.0-44.9, adult ([place]) 06/13/2013  . Overactive bladder 06/13/2013  . Allergic rhinitis 06/13/2013  . Asthma, moderate persistent 06/13/2013  . BIPOLAR DISORDER UNSPECIFIED 03/18/2008  . PERIPHERAL EDEMA 07/03/2007  . GERD 01/30/2007  . HYPERCHOLESTEROLEMIA 12/07/2006  . Essential hypertension 12/07/2006  . Osteoarthritis of right knee 12/07/2006  . ANEMIA, IRON DEFICIENCY 01/06/2006     Past Surgical History:  Procedure Laterality Date  . HERNIA REPAIR    . TONSILLECTOMY     Pt denies  . TUBAL LIGATION      OB History    No data available       Home Medications    Prior to Admission medications   Medication Sig Start Date End Date Taking? Authorizing Provider  ACCU-CHEK SOFTCLIX LANCETS lancets Test blood sugar once daily. Dx: E11.42 10/21/15   Wardell Honour, MD  ADVAIR DISKUS 250-50 MCG/DOSE AEPB Inhale 1 puff by mouth into the lungs two times daily 07/01/15   Shawnee Knapp, MD  Blood Glucose Calibration (ACCU-CHEK AVIVA) SOLN Test blood sugar once daily. Dx: E11.42 10/21/15   Wardell Honour, MD  blood glucose meter kit and supplies KIT Use as directed 4 times a day.  DX: R73.09 06/13/14   Shawnee Knapp, MD  Blood Glucose Monitoring Suppl (ACCU-CHEK AVIVA PLUS) w/Device KIT Test blood sugar once daily. Dx: E11.42 10/21/15   Wardell Honour, MD  cetirizine (ZYRTEC ALLERGY) 10 MG tablet Take 1 tablet (10 mg total) by mouth daily. 11/29/15   Montine Circle, PA-C  colchicine 0.6 MG tablet Take 2 tablets with onset of gout, then 1 tablet every 12 hours until gout flare resolves 01/29/16   Delos Haring, PA-C  diclofenac sodium (VOLTAREN) 1 % GEL Apply 4 g topically 2 (two) times daily. Patient taking differently: Apply 4 g topically 2 (two) times daily as needed (pain).  06/30/15  Darlyne Russian, MD  fluticasone (FLONASE) 50 MCG/ACT nasal spray PLACE 2 SPRAYS INTO BOTH NOSTRILS AT BEDTIME. Patient not taking: Reported on 10/09/2015 07/28/15   Shawnee Knapp, MD  glucose blood (ACCU-CHEK AVIVA PLUS) test strip Test blood sugar once daily. Dx: E11.42 10/21/15   Wardell Honour, MD  hydrOXYzine (ATARAX/VISTARIL) 25 MG tablet Take 25 mg by mouth every 6 (six) hours as needed for anxiety or itching.  08/24/15   Historical Provider, MD  ibuprofen (ADVIL,MOTRIN) 400 MG tablet Take 1 tablet (400 mg total) by mouth every 6 (six) hours as needed. 11/29/15   Montine Circle, PA-C  ipratropium  (ATROVENT HFA) 17 MCG/ACT inhaler Inhale 2 puffs into the lungs every 4 (four) hours as needed for wheezing. 08/05/15   April Palumbo, MD  lisinopril (PRINIVIL,ZESTRIL) 40 MG tablet Take 1 tablet by mouth  daily 10/30/15   Wardell Honour, MD  loperamide (IMODIUM) 2 MG capsule Take 1 capsule (2 mg total) by mouth 4 (four) times daily as needed for diarrhea or loose stools. 01/29/16   Brisia Schuermann Carlota Raspberry, PA-C  meloxicam (MOBIC) 15 MG tablet Take 1 tablet (15 mg total) by mouth daily. 01/29/16   Cammi Consalvo Carlota Raspberry, PA-C  metFORMIN (GLUCOPHAGE) 1000 MG tablet Take 1 tablet (1,000 mg total) by mouth 2 (two) times daily with a meal. 05/12/15   Shawnee Knapp, MD  oxybutynin (DITROPAN XL) 15 MG 24 hr tablet Take 1 tablet by mouth at  bedtime 10/30/15   Wardell Honour, MD  polyethylene glycol powder (MIRALAX) powder Take 17 g by mouth daily. 10/14/15   April Palumbo, MD  pravastatin (PRAVACHOL) 40 MG tablet Take 1 tablet by mouth  daily 10/30/15   Wardell Honour, MD  sucralfate (CARAFATE) 1 GM/10ML suspension Take 10 mLs (1 g total) by mouth 4 (four) times daily -  with meals and at bedtime. 10/14/15   April Palumbo, MD  tiZANidine (ZANAFLEX) 4 MG tablet TAKE 1 TABLET (4 MG TOTAL) BY MOUTH AT BEDTIME. 01/15/16   Chelle Jeffery, PA-C  triamterene-hydrochlorothiazide (MAXZIDE-25) 37.5-25 MG tablet Take 1 tablet by mouth daily.  07/20/15   Historical Provider, MD  VENTOLIN HFA 108 (90 Base) MCG/ACT inhaler Inhale 2 puffs by mouth  every 6 hours as needed for wheezing 01/15/16   Chelle Jeffery, PA-C  ziprasidone (GEODON) 40 MG capsule Take 40 mg by mouth daily. 09/22/15   Historical Provider, MD    Family History Family History  Problem Relation Age of Onset  . Hypertension Mother   . Diabetes Mother   . Hyperlipidemia Mother   . Stroke Mother   . Heart disease Mother   . Pulmonary embolism Mother   . Hypertension Father   . Diabetes Father   . Hyperlipidemia Father     Social History Social History  Substance Use Topics   . Smoking status: Never Smoker  . Smokeless tobacco: Never Used  . Alcohol use 0.0 oz/week     Comment: Wine, Socially      Allergies   Fish allergy; Banana; Other; and Prednisone   Review of Systems Review of Systems  Review of Systems All other systems negative except as documented in the HPI. All pertinent positives and negatives as reviewed in the HPI.  Physical Exam Updated Vital Signs BP 124/69   Pulse 92   Temp 98.2 F (36.8 C)   Resp 21   Ht 5' 2" (1.575 m)   Wt 101.2 kg   LMP 05/30/2013   SpO2  97%   BMI 40.79 kg/m   Physical Exam  Constitutional: She appears well-developed and well-nourished. No distress.  HENT:  Head: Normocephalic and atraumatic.  Right Ear: Tympanic membrane and ear canal normal.  Left Ear: Tympanic membrane and ear canal normal.  Nose: Nose normal.  Mouth/Throat: Uvula is midline, oropharynx is clear and moist and mucous membranes are normal.  Eyes: Pupils are equal, round, and reactive to light.  Neck: Normal range of motion. Neck supple.  Cardiovascular: Normal rate and regular rhythm.   Pulmonary/Chest: Effort normal.  Abdominal: Soft.  No signs of abdominal distention  Musculoskeletal:  No LE swelling.  Tenderness to all four extremities without objective abnormalities on physical exam.  Neurological: She is alert.  Acting at baseline  Skin: Skin is warm and dry. No rash noted.  Psychiatric: She exhibits a depressed mood. She expresses no homicidal and no suicidal ideation. She expresses no suicidal plans and no homicidal plans.  Nursing note and vitals reviewed.   ED Treatments / Results  Labs (all labs ordered are listed, but only abnormal results are displayed) Labs Reviewed  CBC WITH DIFFERENTIAL/PLATELET - Abnormal; Notable for the following:       Result Value   WBC 18.0 (*)    Hemoglobin 11.2 (*)    HCT 32.5 (*)    Platelets 533 (*)    Neutro Abs 14.2 (*)    All other components within normal limits   COMPREHENSIVE METABOLIC PANEL - Abnormal; Notable for the following:    Sodium 134 (*)    Chloride 95 (*)    Glucose, Bld 287 (*)    Creatinine, Ser 1.12 (*)    Albumin 2.8 (*)    Alkaline Phosphatase 152 (*)    GFR calc non Af Amer 54 (*)    All other components within normal limits    EKG  EKG Interpretation  Date/Time:  Thursday January 28 2016 21:15:58 EST Ventricular Rate:  121 PR Interval:  148 QRS Duration: 68 QT Interval:  284 QTC Calculation: 403 R Axis:   59 Text Interpretation:  Sinus tachycardia Otherwise normal ECG No significant change since last tracing Confirmed by [facility] MD, PEDRO ([id number]) on 01/28/2016 9:15:28 PM       Radiology Dg Chest 2 View  Result Date: 01/28/2016 CLINICAL DATA:  Subacute onset of right-sided chest pain, with shortness of breath and tingling down the left arm. Initial encounter. EXAM: CHEST  2 VIEW COMPARISON:  Chest radiograph performed 11/26/2015 FINDINGS: The lungs are well-aerated. Minimal bibasilar atelectasis is noted. There is no evidence of pleural effusion or pneumothorax. The heart is borderline normal in size. No acute osseous abnormalities are seen. IMPRESSION: Minimal bibasilar atelectasis noted.  Lungs otherwise clear. Electronically Signed   By: Garald Balding M.D.   On: 01/28/2016 21:37    Procedures Procedures (including critical care time)  Medications Ordered in ED Medications  loperamide (IMODIUM) capsule 2 mg (not administered)  sodium chloride 0.9 % bolus 1,000 mL (0 mLs Intravenous Stopped 01/29/16 0603)  meloxicam (MOBIC) tablet 15 mg (15 mg Oral Given 01/29/16 0253)  fentaNYL (SUBLIMAZE) injection 75 mcg (75 mcg Intravenous Given 01/29/16 0538)  ketorolac (TORADOL) 30 MG/ML injection 30 mg (30 mg Intravenous Given 01/29/16 0538)  colchicine tablet 0.6 mg (0.6 mg Oral Given 01/29/16 0602)     Initial Impression / Assessment and Plan / ED Course  I have reviewed the triage vital signs and the nursing  notes.  Pertinent labs & imaging results that  were available during my care of the patient were reviewed by me and considered in my medical decision making (see chart for details).  Clinical Course     The patient admits to emotional difficulty after loosing her aunts- will provide referrals. She doesn't want BH and does not want Dr. Addison Naegeli. She has no Si/HI.  She is having gout flair and is out of her colchicine and Mobic, will refill these  Chest xray, vitals, auscultation exam are all WNL. Pt has HFA here in ED and uses it as needed. Otherwise vital signs show an elevated WBC count, etiology  Discussed case with Dr. Claudine Mouton, feel she is safe for discharge, given packet on psychiatric follow-up information to [facility], psychiatrist and psychologist.  Final Clinical Impressions(s) / ED Diagnoses   Final diagnoses:  Adjustment disorder, unspecified type  Acute gout involving toe, unspecified cause, unspecified laterality    New Prescriptions New Prescriptions   LOPERAMIDE (IMODIUM) 2 MG CAPSULE    Take 1 capsule (2 mg total) by mouth 4 (four) times daily as needed for diarrhea or loose stools.     Delos Haring, PA-C 01/29/16 [id number]    [name], MD 01/29/16 [phone]

## 2016-01-29 NOTE — ED Notes (Signed)
Pt stated she felt weak in her knees and unable to stand without assistance. Pt stood with assistance using a walker with great difficulty, and was unable to take a step forward. Pt was assisted back into bed.

## 2016-02-05 ENCOUNTER — Encounter: Payer: Self-pay | Admitting: Neurology

## 2016-02-12 ENCOUNTER — Other Ambulatory Visit: Payer: Self-pay | Admitting: *Deleted

## 2016-02-14 ENCOUNTER — Other Ambulatory Visit: Payer: Self-pay | Admitting: Physician Assistant

## 2016-02-16 ENCOUNTER — Encounter (HOSPITAL_COMMUNITY): Payer: Self-pay | Admitting: Emergency Medicine

## 2016-02-16 ENCOUNTER — Emergency Department (HOSPITAL_COMMUNITY)
Admission: EM | Admit: 2016-02-16 | Discharge: 2016-02-16 | Disposition: A | Discharge status: Home / Self Care | Payer: Medicare Other | Attending: Emergency Medicine | Admitting: Emergency Medicine

## 2016-02-16 DIAGNOSIS — M25561 Pain in right knee: Secondary | ICD-10-CM | POA: Diagnosis not present

## 2016-02-16 DIAGNOSIS — E119 Type 2 diabetes mellitus without complications: Secondary | ICD-10-CM | POA: Diagnosis not present

## 2016-02-16 DIAGNOSIS — I10 Essential (primary) hypertension: Secondary | ICD-10-CM | POA: Diagnosis not present

## 2016-02-16 DIAGNOSIS — G8929 Other chronic pain: Secondary | ICD-10-CM | POA: Diagnosis not present

## 2016-02-16 DIAGNOSIS — J45909 Unspecified asthma, uncomplicated: Secondary | ICD-10-CM | POA: Diagnosis not present

## 2016-02-16 MED ORDER — ACETAMINOPHEN 500 MG PO TABS
1000.0000 mg | ORAL_TABLET | Freq: Once | ORAL | Status: AC
  Administered 2016-02-16: 1000 mg via ORAL
  Filled 2016-02-16: qty 2

## 2016-02-16 NOTE — ED Provider Notes (Signed)
[facility] DEPT Provider Note   CSN: [id number] Arrival date & time: 02/16/16  1947  By signing my name below, I, Emmanuella Mensah, attest that this documentation has been prepared under the direction and in the presence of Etta Quill, NP. Electronically Signed: Judithann Sauger, ED Scribe. 02/16/16. 9:07 PM.   History   Chief Complaint Chief Complaint  Patient presents with  . Knee Pain    HPI Comments: Melissa Chavez is a 55 y.o. female with a hx of gout, arthritis to the knee, hypertension, and DM who presents to the Emergency Department complaining of chronic moderate right knee pain with swelling that has been ongoing for several months. She reports associated bilateral feet pain with swelling and difficulty ambulating secondary to pain. No alleviating factors noted. Pt has been evaluated multiple times in the ED for this chronic issue (11 visits in 6 months). Her last visit was on 01/29/16 where she received a refill of her colchicine and mobic. She states that she is here because her symptoms are progressing despite being complaint with the medications. She reports that she had a referral to Dr. Wynelle Link (Ortho) in August 2017 but she is still waiting on a call from his office to schedule an appointment. She also states that she has a new PCP but denies a referral to pain management. Pt has an allergy to fish, bananas, and prednisone. She denies a hx of CHF. She denies any fever, chills, color changes, open wounds, nausea, vomiting, numbness/tingling, or any other symptoms. Pt has an upcoming PCP appointment in 3 months.   Current PCP: Dr. Kathyrn Lass at [facility]  The history is provided by the patient. No language interpreter was used.    Past Medical History:  Diagnosis Date  . Allergy   . Anemia   . Asthma   . Bipolar 2 disorder ([place])   . Diabetes mellitus without complication ([place])   . GERD (gastroesophageal reflux disease)   . Glaucoma    Pt denies  . Hernia   .  Hypertension     Patient Active Problem List   Diagnosis Date Noted  . Sinus congestion 11/29/2015  . Bilateral leg edema 09/25/2015  . Abdominal pain, left upper quadrant 09/25/2015  . 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 Prednisone adverse reaction 08/20/2015  . Abdominal pain, chronic, epigastric 06/30/2015  . Osteoarthritis of right foot 03/22/2015  . Type 2 diabetes mellitus with diabetic polyneuropathy, without long-term current use of insulin ([place]) 03/20/2015  . BMI 40.0-44.9, adult ([place]) 06/13/2013  . Overactive bladder 06/13/2013  . Allergic rhinitis 06/13/2013  . Asthma, moderate persistent 06/13/2013  . BIPOLAR DISORDER UNSPECIFIED 03/18/2008  . PERIPHERAL EDEMA 07/03/2007  . GERD 01/30/2007  . HYPERCHOLESTEROLEMIA 12/07/2006  . Essential hypertension 12/07/2006  . Osteoarthritis of right knee 12/07/2006  . ANEMIA, IRON DEFICIENCY 01/06/2006    Past Surgical History:  Procedure Laterality Date  . HERNIA REPAIR    . TONSILLECTOMY     Pt denies  . TUBAL LIGATION      OB History    No data available       Home Medications    Prior to Admission medications   Medication Sig Start Date End Date Taking? Authorizing Provider  ACCU-CHEK SOFTCLIX LANCETS lancets Test blood sugar once daily. Dx: E11.42 10/21/15   Wardell Honour, MD  ADVAIR DISKUS 250-50 MCG/DOSE AEPB Inhale 1 puff by mouth into the lungs two times daily 07/01/15   Shawnee Knapp, MD  Blood Glucose Calibration (ACCU-CHEK AVIVA) SOLN Test blood sugar once daily. Dx: E11.42 10/21/15   Wardell Honour, MD  blood glucose meter kit and supplies KIT Use as directed 4 times a day.  DX: R73.09 06/13/14   Shawnee Knapp, MD  Blood Glucose Monitoring Suppl (ACCU-CHEK AVIVA PLUS) w/Device KIT Test blood sugar once daily. Dx: E11.42 10/21/15   Wardell Honour, MD  cetirizine (ZYRTEC ALLERGY) 10 MG tablet Take 1 tablet (10 mg total) by mouth daily. 11/29/15   Montine Circle, PA-C  colchicine 0.6 MG tablet  Take 2 tablets with onset of gout, then 1 tablet every 12 hours until gout flare resolves 01/29/16   Delos Haring, PA-C  diclofenac sodium (VOLTAREN) 1 % GEL Apply 4 g topically 2 (two) times daily. Patient taking differently: Apply 4 g topically 2 (two) times daily as needed (pain).  06/30/15   Darlyne Russian, MD  fluticasone (FLONASE) 50 MCG/ACT nasal spray PLACE 2 SPRAYS INTO BOTH NOSTRILS AT BEDTIME. Patient not taking: Reported on 10/09/2015 07/28/15   Shawnee Knapp, MD  glucose blood (ACCU-CHEK AVIVA PLUS) test strip Test blood sugar once daily. Dx: E11.42 10/21/15   Wardell Honour, MD  hydrOXYzine (ATARAX/VISTARIL) 25 MG tablet Take 25 mg by mouth every 6 (six) hours as needed for anxiety or itching.  08/24/15   Historical Provider, MD  ibuprofen (ADVIL,MOTRIN) 400 MG tablet Take 1 tablet (400 mg total) by mouth every 6 (six) hours as needed. 11/29/15   Montine Circle, PA-C  ipratropium (ATROVENT HFA) 17 MCG/ACT inhaler Inhale 2 puffs into the lungs every 4 (four) hours as needed for wheezing. 08/05/15   April Palumbo, MD  lisinopril (PRINIVIL,ZESTRIL) 40 MG tablet Take 1 tablet by mouth  daily 10/30/15   Wardell Honour, MD  loperamide (IMODIUM) 2 MG capsule Take 1 capsule (2 mg total) by mouth 4 (four) times daily as needed for diarrhea or loose stools. 01/29/16   Tiffany Carlota Raspberry, PA-C  meloxicam (MOBIC) 15 MG tablet Take 1 tablet (15 mg total) by mouth daily. 01/29/16   Tiffany Carlota Raspberry, PA-C  metFORMIN (GLUCOPHAGE) 1000 MG tablet Take 1 tablet (1,000 mg total) by mouth 2 (two) times daily with a meal. 05/12/15   Shawnee Knapp, MD  oxybutynin (DITROPAN XL) 15 MG 24 hr tablet Take 1 tablet by mouth at  bedtime 10/30/15   Wardell Honour, MD  polyethylene glycol powder (MIRALAX) powder Take 17 g by mouth daily. 10/14/15   April Palumbo, MD  pravastatin (PRAVACHOL) 40 MG tablet Take 1 tablet by mouth  daily 10/30/15   Wardell Honour, MD  sucralfate (CARAFATE) 1 GM/10ML suspension Take 10 mLs (1 g total) by mouth 4  (four) times daily -  with meals and at bedtime. 10/14/15   April Palumbo, MD  tiZANidine (ZANAFLEX) 4 MG tablet TAKE 1 TABLET (4 MG TOTAL) BY MOUTH AT BEDTIME. 01/15/16   Chelle Jeffery, PA-C  triamterene-hydrochlorothiazide (MAXZIDE-25) 37.5-25 MG tablet Take 1 tablet by mouth daily.  07/20/15   Historical Provider, MD  VENTOLIN HFA 108 (90 Base) MCG/ACT inhaler Inhale 2 puffs by mouth  every 6 hours as needed for wheezing 01/15/16   Chelle Jeffery, PA-C  ziprasidone (GEODON) 40 MG capsule Take 40 mg by mouth daily. 09/22/15   Historical Provider, MD    Family History Family History  Problem Relation Age of Onset  . Hypertension Mother   . Diabetes Mother   . Hyperlipidemia Mother   . Stroke Mother   .  Heart disease Mother   . Pulmonary embolism Mother   . Hypertension Father   . Diabetes Father   . Hyperlipidemia Father     Social History Social History  Substance Use Topics  . Smoking status: Never Smoker  . Smokeless tobacco: Never Used  . Alcohol use 0.0 oz/week     Comment: Wine, Socially      Allergies   Fish allergy; Banana; Other; and Prednisone   Review of Systems Review of Systems  Constitutional: Negative for chills and fever.  Gastrointestinal: Negative for nausea and vomiting.  Musculoskeletal: Positive for arthralgias and joint swelling.  Skin: Negative for color change and wound.  Neurological: Negative for numbness.  All other systems reviewed and are negative.    Physical Exam Updated Vital Signs BP 160/84 (BP Location: Left Arm)   Temp 98.7 F (37.1 C) (Oral)   Resp 20   Ht 5' 2"  (1.575 m)   Wt 223 lb (101.2 kg)   LMP 05/30/2013   SpO2 99%   BMI 40.79 kg/m   Physical Exam  Constitutional: She is oriented to person, place, and time. She appears well-developed and well-nourished. No distress.  HENT:  Head: Normocephalic and atraumatic.  Eyes: Conjunctivae and EOM are normal.  Neck: Neck supple. No tracheal deviation present.    Cardiovascular: Normal rate.   Pulmonary/Chest: Effort normal. No respiratory distress.  Musculoskeletal: Normal range of motion.       Right knee: Tenderness found.       Legs:      Right foot: There is swelling.       Left foot: There is swelling.  Joints of lower extremities without increased warmth. Moderate swelling of feet noted bilaterally. Distal pulses, sensation, and movement intact.  Neurological: She is alert and oriented to person, place, and time.  Skin: Skin is warm and dry.  Psychiatric: She has a normal mood and affect. Her behavior is normal.  Nursing note and vitals reviewed.    ED Treatments / Results  DIAGNOSTIC STUDIES: Oxygen Saturation is 99% on RA, normal by my interpretation.    COORDINATION OF CARE: 9:02 PM- Pt advised of plan for treatment and pt agrees. Advised to continue NSAIDs and to add Tylenol for pain. Recommended to follow up with Ortho.    Labs (all labs ordered are listed, but only abnormal results are displayed) Labs Reviewed - No data to display  EKG  EKG Interpretation None       Radiology No results found.  Procedures Procedures (including critical care time)  Medications Ordered in ED Medications - No data to display   Initial Impression / Assessment and Plan / ED Course  Etta Quill, NP has reviewed the triage vital signs and the nursing notes.  Pertinent labs & imaging results that were available during my care of the patient were reviewed by me and considered in my medical decision making (see chart for details).  Clinical Course    Patient with chronic lower extremity pain. History of OA and gout.  No current indication of infectious process.  Pt advised to follow up with her PCP, orthopedics, and consider pain management specialist. Conservative therapy recommended and discussed. Patient will be discharged home & is agreeable with above plan. Returns precautions discussed. Pt appears safe for discharge.    Final  Clinical Impressions(s) / ED Diagnoses   Final diagnoses:  Chronic pain of right knee    New Prescriptions Discharge Medication List as of 02/16/2016  9:21 PM  I personally performed the services described in this documentation, which was scribed in my presence. The recorded information has been reviewed and is accurate.     Etta Quill, NP 02/17/16 0234    Etta Quill, NP 02/17/16 4353    Dorie Rank, MD 02/17/16 1255

## 2016-02-16 NOTE — ED Triage Notes (Signed)
Pt has h/s of gout, arthritis and pt states pain and swelling in both feet started a few days ago

## 2016-02-16 NOTE — Discharge Instructions (Signed)
Please continue with your current treatment plan. Follow-up with Dr. Hyacinth MeekerMiller, and check on your referral with Dr. Despina HickAlusio.  You may want to consider exploring treatment by a pain management specialist.  Several are listed on your discharge instructions.

## 2016-02-17 ENCOUNTER — Encounter (HOSPITAL_COMMUNITY): Payer: Self-pay | Admitting: Emergency Medicine

## 2016-02-17 ENCOUNTER — Emergency Department (HOSPITAL_COMMUNITY)
Admission: EM | Admit: 2016-02-17 | Discharge: 2016-02-17 | Disposition: A | Discharge status: Home / Self Care | Payer: Medicare Other | Attending: Emergency Medicine | Admitting: Emergency Medicine

## 2016-02-17 ENCOUNTER — Telehealth: Payer: Self-pay | Admitting: *Deleted

## 2016-02-17 DIAGNOSIS — J45909 Unspecified asthma, uncomplicated: Secondary | ICD-10-CM | POA: Insufficient documentation

## 2016-02-17 DIAGNOSIS — R609 Edema, unspecified: Secondary | ICD-10-CM | POA: Insufficient documentation

## 2016-02-17 DIAGNOSIS — G8929 Other chronic pain: Secondary | ICD-10-CM | POA: Diagnosis not present

## 2016-02-17 DIAGNOSIS — D509 Iron deficiency anemia, unspecified: Secondary | ICD-10-CM | POA: Insufficient documentation

## 2016-02-17 DIAGNOSIS — I10 Essential (primary) hypertension: Secondary | ICD-10-CM | POA: Insufficient documentation

## 2016-02-17 DIAGNOSIS — Z79899 Other long term (current) drug therapy: Secondary | ICD-10-CM | POA: Diagnosis not present

## 2016-02-17 DIAGNOSIS — J3489 Other specified disorders of nose and nasal sinuses: Secondary | ICD-10-CM | POA: Insufficient documentation

## 2016-02-17 DIAGNOSIS — E1140 Type 2 diabetes mellitus with diabetic neuropathy, unspecified: Secondary | ICD-10-CM | POA: Insufficient documentation

## 2016-02-17 DIAGNOSIS — M7989 Other specified soft tissue disorders: Secondary | ICD-10-CM | POA: Diagnosis present

## 2016-02-17 DIAGNOSIS — R519 Headache, unspecified: Secondary | ICD-10-CM

## 2016-02-17 DIAGNOSIS — R51 Headache: Secondary | ICD-10-CM

## 2016-02-17 DIAGNOSIS — Z7984 Long term (current) use of oral hypoglycemic drugs: Secondary | ICD-10-CM | POA: Insufficient documentation

## 2016-02-17 DIAGNOSIS — D649 Anemia, unspecified: Secondary | ICD-10-CM

## 2016-02-17 DIAGNOSIS — E876 Hypokalemia: Secondary | ICD-10-CM | POA: Insufficient documentation

## 2016-02-17 DIAGNOSIS — M25561 Pain in right knee: Secondary | ICD-10-CM | POA: Insufficient documentation

## 2016-02-17 LAB — COMPREHENSIVE METABOLIC PANEL
ALT: 12 U/L — ABNORMAL LOW (ref 14–54)
AST: 16 U/L (ref 15–41)
Albumin: 3.1 g/dL — ABNORMAL LOW (ref 3.5–5.0)
Alkaline Phosphatase: 73 U/L (ref 38–126)
Anion gap: 11 (ref 5–15)
BUN: 8 mg/dL (ref 6–20)
CO2: 25 mmol/L (ref 22–32)
Calcium: 8.9 mg/dL (ref 8.9–10.3)
Chloride: 103 mmol/L (ref 101–111)
Creatinine, Ser: 1.06 mg/dL — ABNORMAL HIGH (ref 0.44–1.00)
GFR calc Af Amer: >60 mL/min (ref >60)
GFR calc non Af Amer: 58 mL/min — ABNORMAL LOW (ref >60)
Glucose, Bld: 151 mg/dL — ABNORMAL HIGH (ref 65–99)
Potassium: 3.4 mmol/L — ABNORMAL LOW (ref 3.5–5.1)
Sodium: 139 mmol/L (ref 135–145)
Total Bilirubin: 0.7 mg/dL (ref 0.3–1.2)
Total Protein: 6.8 g/dL (ref 6.5–8.1)

## 2016-02-17 LAB — CBC WITH DIFFERENTIAL/PLATELET
Basophils Absolute: 0 10*3/uL (ref 0.0–0.1)
Basophils Relative: 0 %
Eosinophils Absolute: 0.3 10*3/uL (ref 0.0–0.7)
Eosinophils Relative: 3 %
HCT: 32.8 % — ABNORMAL LOW (ref 36.0–46.0)
Hemoglobin: 10.7 g/dL — ABNORMAL LOW (ref 12.0–15.0)
Lymphocytes Relative: 24 %
Lymphs Abs: 2.5 10*3/uL (ref 0.7–4.0)
MCH: 26.6 pg (ref 26.0–34.0)
MCHC: 32.6 g/dL (ref 30.0–36.0)
MCV: 81.4 fL (ref 78.0–100.0)
Monocytes Absolute: 0.7 10*3/uL (ref 0.1–1.0)
Monocytes Relative: 7 %
Neutro Abs: 7.1 10*3/uL (ref 1.7–7.7)
Neutrophils Relative %: 66 %
Platelets: 310 10*3/uL (ref 150–400)
RBC: 4.03 MIL/uL (ref 3.87–5.11)
RDW: 16.6 % — ABNORMAL HIGH (ref 11.5–15.5)
WBC: 10.7 10*3/uL — ABNORMAL HIGH (ref 4.0–10.5)

## 2016-02-17 MED ORDER — POTASSIUM CHLORIDE CRYS ER 20 MEQ PO TBCR
40.0000 meq | EXTENDED_RELEASE_TABLET | Freq: Once | ORAL | Status: AC
  Administered 2016-02-17: 40 meq via ORAL
  Filled 2016-02-17: qty 2

## 2016-02-17 MED ORDER — FUROSEMIDE 10 MG/ML IJ SOLN
40.0000 mg | Freq: Once | INTRAMUSCULAR | Status: AC
  Administered 2016-02-17: 40 mg via INTRAVENOUS
  Filled 2016-02-17: qty 4

## 2016-02-17 MED ORDER — AMOXICILLIN 500 MG PO CAPS: 1000.0000 mg | ORAL_CAPSULE | Freq: Two times a day (BID) | ORAL | 0 refills | Status: DC

## 2016-02-17 MED ORDER — METOCLOPRAMIDE HCL 5 MG/ML IJ SOLN
10.0000 mg | Freq: Once | INTRAMUSCULAR | Status: AC
Start: 2016-02-17 — End: 2016-02-17
  Administered 2016-02-17: 10 mg via INTRAVENOUS
  Filled 2016-02-17: qty 2

## 2016-02-17 MED ORDER — AMOXICILLIN 500 MG PO CAPS
1000.0000 mg | ORAL_CAPSULE | Freq: Once | ORAL | Status: AC
  Administered 2016-02-17: 1000 mg via ORAL
  Filled 2016-02-17: qty 2

## 2016-02-17 MED ORDER — FUROSEMIDE 40 MG PO TABS: 40.0000 mg | ORAL_TABLET | Freq: Every day | ORAL | 0 refills | Status: DC

## 2016-02-17 MED ORDER — DIPHENHYDRAMINE HCL 50 MG/ML IJ SOLN
25.0000 mg | Freq: Once | INTRAMUSCULAR | Status: AC
  Administered 2016-02-17: 25 mg via INTRAVENOUS
  Filled 2016-02-17: qty 1

## 2016-02-17 NOTE — ED Provider Notes (Signed)
[facility] DEPT Provider Note   CSN: [id number] Arrival date & time: 02/17/16  0321   By signing my name below, I, Delton Prairie, attest that this documentation has been prepared under the direction and in the presence of Delora Fuel, MD  Electronically Signed: Delton Prairie, ED Scribe. 02/17/16. 4:00 AM.   History   Chief Complaint Chief Complaint  Patient presents with  . Foot Swelling  . Hypertension    The history is provided by the patient. No language interpreter was used.   HPI Comments:  Melissa Chavez is a 55 y.o. female, with a hx of HTN and osteoarthritis, who presents to the Emergency Department complaining of sudden onset, moderate bilateral leg swelling. Pt notes sudden onset headache with associated nausea and blurry vision. She notes her blood pressure was also high when she initially arrived to the ED. She has taken colchicine with no relief. Pt was seen around 9 PM yesterday at [facility] for similar symptoms and was discharged. Pt denies any other associated symptoms and modifying factors at this time. She states she is followed by a PCP, Dr. Kathyrn Lass. She is on triamterene and enalapril .   Past Medical History:  Diagnosis Date  . Allergy   . Anemia   . Asthma   . Bipolar 2 disorder ([place])   . Diabetes mellitus without complication ([place])   . GERD (gastroesophageal reflux disease)   . Glaucoma    Pt denies  . Hernia   . Hypertension     Patient Active Problem List   Diagnosis Date Noted  . Sinus congestion 11/29/2015  . Bilateral leg edema 09/25/2015  . Abdominal pain, left upper quadrant 09/25/2015  . 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 Prednisone adverse reaction 08/20/2015  . Abdominal pain, chronic, epigastric 06/30/2015  . Osteoarthritis of right foot 03/22/2015  . Type 2 diabetes mellitus with diabetic polyneuropathy, without long-term current use of insulin ([place]) 03/20/2015  . BMI 40.0-44.9, adult ([place]) 06/13/2013  .  Overactive bladder 06/13/2013  . Allergic rhinitis 06/13/2013  . Asthma, moderate persistent 06/13/2013  . BIPOLAR DISORDER UNSPECIFIED 03/18/2008  . PERIPHERAL EDEMA 07/03/2007  . GERD 01/30/2007  . HYPERCHOLESTEROLEMIA 12/07/2006  . Essential hypertension 12/07/2006  . Osteoarthritis of right knee 12/07/2006  . ANEMIA, IRON DEFICIENCY 01/06/2006    Past Surgical History:  Procedure Laterality Date  . HERNIA REPAIR    . TONSILLECTOMY     Pt denies  . TUBAL LIGATION      OB History    No data available       Home Medications    Prior to Admission medications   Medication Sig Start Date End Date Taking? Authorizing Provider  ACCU-CHEK SOFTCLIX LANCETS lancets Test blood sugar once daily. Dx: E11.42 10/21/15   Wardell Honour, MD  ADVAIR DISKUS 250-50 MCG/DOSE AEPB Inhale 1 puff by mouth into the lungs two times daily 07/01/15   Shawnee Knapp, MD  Blood Glucose Calibration (ACCU-CHEK AVIVA) SOLN Test blood sugar once daily. Dx: E11.42 10/21/15   Wardell Honour, MD  blood glucose meter kit and supplies KIT Use as directed 4 times a day.  DX: R73.09 06/13/14   Shawnee Knapp, MD  Blood Glucose Monitoring Suppl (ACCU-CHEK AVIVA PLUS) w/Device KIT Test blood sugar once daily. Dx: E11.42 10/21/15   Wardell Honour, MD  cetirizine (ZYRTEC ALLERGY) 10 MG tablet Take 1 tablet (10 mg total) by mouth daily. 11/29/15   Montine Circle, PA-C  colchicine 0.6 MG tablet Take 2 tablets with onset of gout, then 1 tablet every 12 hours until gout flare resolves 01/29/16   Delos Haring, PA-C  diclofenac sodium (VOLTAREN) 1 % GEL Apply 4 g topically 2 (two) times daily. Patient taking differently: Apply 4 g topically 2 (two) times daily as needed (pain).  06/30/15   Darlyne Russian, MD  fluticasone (FLONASE) 50 MCG/ACT nasal spray PLACE 2 SPRAYS INTO BOTH NOSTRILS AT BEDTIME. Patient not taking: Reported on 10/09/2015 07/28/15   Shawnee Knapp, MD  glucose blood (ACCU-CHEK AVIVA PLUS) test strip Test blood sugar once  daily. Dx: E11.42 10/21/15   Wardell Honour, MD  hydrOXYzine (ATARAX/VISTARIL) 25 MG tablet Take 25 mg by mouth every 6 (six) hours as needed for anxiety or itching.  08/24/15   Historical Provider, MD  ibuprofen (ADVIL,MOTRIN) 400 MG tablet Take 1 tablet (400 mg total) by mouth every 6 (six) hours as needed. 11/29/15   Montine Circle, PA-C  ipratropium (ATROVENT HFA) 17 MCG/ACT inhaler Inhale 2 puffs into the lungs every 4 (four) hours as needed for wheezing. 08/05/15   April Palumbo, MD  lisinopril (PRINIVIL,ZESTRIL) 40 MG tablet Take 1 tablet by mouth  daily 10/30/15   Wardell Honour, MD  loperamide (IMODIUM) 2 MG capsule Take 1 capsule (2 mg total) by mouth 4 (four) times daily as needed for diarrhea or loose stools. 01/29/16   Tiffany Carlota Raspberry, PA-C  meloxicam (MOBIC) 15 MG tablet Take 1 tablet (15 mg total) by mouth daily. 01/29/16   Tiffany Carlota Raspberry, PA-C  metFORMIN (GLUCOPHAGE) 1000 MG tablet Take 1 tablet (1,000 mg total) by mouth 2 (two) times daily with a meal. 05/12/15   Shawnee Knapp, MD  oxybutynin (DITROPAN XL) 15 MG 24 hr tablet Take 1 tablet by mouth at  bedtime 10/30/15   Wardell Honour, MD  polyethylene glycol powder (MIRALAX) powder Take 17 g by mouth daily. 10/14/15   April Palumbo, MD  pravastatin (PRAVACHOL) 40 MG tablet Take 1 tablet by mouth  daily 10/30/15   Wardell Honour, MD  sucralfate (CARAFATE) 1 GM/10ML suspension Take 10 mLs (1 g total) by mouth 4 (four) times daily -  with meals and at bedtime. 10/14/15   April Palumbo, MD  tiZANidine (ZANAFLEX) 4 MG tablet TAKE 1 TABLET (4 MG TOTAL) BY MOUTH AT BEDTIME. 01/15/16   Chelle Jeffery, PA-C  triamterene-hydrochlorothiazide (MAXZIDE-25) 37.5-25 MG tablet Take 1 tablet by mouth daily.  07/20/15   Historical Provider, MD  VENTOLIN HFA 108 (90 Base) MCG/ACT inhaler Inhale 2 puffs by mouth  every 6 hours as needed for wheezing 01/15/16   Chelle Jeffery, PA-C  ziprasidone (GEODON) 40 MG capsule Take 40 mg by mouth daily. 09/22/15   Historical  Provider, MD    Family History Family History  Problem Relation Age of Onset  . Hypertension Mother   . Diabetes Mother   . Hyperlipidemia Mother   . Stroke Mother   . Heart disease Mother   . Pulmonary embolism Mother   . Hypertension Father   . Diabetes Father   . Hyperlipidemia Father     Social History Social History  Substance Use Topics  . Smoking status: Never Smoker  . Smokeless tobacco: Never Used  . Alcohol use 0.0 oz/week     Comment: Wine, Socially      Allergies   Fish allergy; Banana; Other; and Prednisone   Review of Systems Review of Systems  Cardiovascular: Positive for leg swelling.  Gastrointestinal: Positive for nausea.  Neurological: Positive for headaches.  All other systems reviewed and are negative.  Physical Exam Updated Vital Signs BP 129/70 (BP Location: Right Arm)   Pulse 85   Resp 18   Ht _0  (1.575 m)   Wt 223 lb (101.2 kg)   LMP 05/30/2013   SpO2 100%   BMI 40.79 kg/m   Physical Exam  Constitutional: She is oriented to person, place, and time. She appears well-developed and well-nourished.  HENT:  Head: Normocephalic and atraumatic.  Tenderness over maxillary sinuses, sphenoid sinuses and over temporalis muscles bilaterally. Tender over insertion of parcervical muscles.   Eyes: EOM are normal. Pupils are equal, round, and reactive to light.  Neck: Normal range of motion. Neck supple. No JVD present.  Cardiovascular: Normal rate, regular rhythm and normal heart sounds.   No murmur heard. Pulmonary/Chest: Effort normal and breath sounds normal. She has no wheezes. She has no rales. She exhibits no tenderness.  Abdominal: Soft. Bowel sounds are normal. She exhibits no distension and no mass. There is no tenderness.  Musculoskeletal: Normal range of motion. She exhibits no edema.  2-3+ pretibial and pedal edema   Lymphadenopathy:    She has no cervical adenopathy.  Neurological: She is alert and oriented to person, place,  and time. No cranial nerve deficit. She exhibits normal muscle tone. Coordination normal.  Skin: Skin is warm and dry. No rash noted.  Psychiatric: She has a normal mood and affect. Her behavior is normal. Judgment and thought content normal.  Nursing note and vitals reviewed.   ED Treatments / Results  DIAGNOSTIC STUDIES:  Oxygen Saturation is 100% on RA, normal by my interpretation.    COORDINATION OF CARE:  3:56 AM Discussed treatment plan with pt at bedside and pt agreed to plan.  Labs (all labs ordered are listed, but only abnormal results are displayed) Labs Reviewed  CBC WITH DIFFERENTIAL/PLATELET - Abnormal; Notable for the following:       Result Value   WBC 10.7 (*)    Hemoglobin 10.7 (*)    HCT 32.8 (*)    RDW 16.6 (*)    All other components within normal limits  COMPREHENSIVE METABOLIC PANEL - Abnormal; Notable for the following:    Potassium 3.4 (*)    Glucose, Bld 151 (*)    Creatinine, Ser 1.06 (*)    Albumin 3.1 (*)    ALT 12 (*)    GFR calc non Af Amer 58 (*)    All other components within normal limits    Procedures Procedures (including critical care time)  Medications Ordered in ED Medications  metoCLOPramide (REGLAN) injection 10 mg (10 mg Intravenous Given 02/17/16 0521)  diphenhydrAMINE (BENADRYL) injection 25 mg (25 mg Intravenous Given 02/17/16 0521)  furosemide (LASIX) injection 40 mg (40 mg Intravenous Given 02/17/16 0518)  amoxicillin (AMOXIL) capsule 1,000 mg (1,000 mg Oral Given 02/17/16 0526)  potassium chloride SA (K-DUR,KLOR-CON) CR tablet 40 mEq (40 mEq Oral Given 02/17/16 0529)     Initial Impression / Assessment and Plan / ED Course  I have reviewed the triage vital signs and the nursing notes.  Pertinent labs & imaging results that were available during my care of the patient were reviewed by me and considered in my medical decision making (see chart for details).  Clinical Course    Peripheral edema. Headache which seems to  be accommodation muscle contraction headache and sinus headache. IV is started she is given furosemide and metoclopramide.  IV fluid is not given because of severity of peripheral edema. Old records are reviewed confirming ED visit yesterday for knee pain. Following above-noted treatment, she fell asleep and states her headache was feeling much better. She was given a dose of amoxicillin to treat presumed sinusitis. She is discharged with prescriptions for furosemide and amoxicillin. Also, screening labs were obtained which did show borderline hypokalemia. Given the fact that she is going to be getting diuretics, she is sent home with prescription for K-Dur as well.  Final Clinical Impressions(s) / ED Diagnoses   Final diagnoses:  Peripheral edema  Sinus headache  Chronic pain of right knee  Hypokalemia  Normochromic normocytic anemia   I personally performed the services described in this documentation, which was scribed in my presence. The recorded information has been reviewed and is accurate.    New Prescriptions New Prescriptions   AMOXICILLIN (AMOXIL) 500 MG CAPSULE    Take 2 capsules (1,000 mg total) by mouth 2 (two) times daily.   FUROSEMIDE (LASIX) 40 MG TABLET    Take 1 tablet (40 mg total) by mouth daily.    I personally performed the services described in this documentation, which was scribed in my presence. The recorded information has been reviewed and is accurate.       Delora Fuel, MD 71/83/67 2550

## 2016-02-17 NOTE — Progress Notes (Deleted)
Patient ID: Melissa Chavez, female   DOB: 1960/04/07, 55 y.o.   MRN: [id number] Subjective:    Melissa Chavez is a 55 y.o. female who presents for Medicare Annual/Subsequent preventive examination.  Preventive Screening-Counseling & Management  Tobacco History  Smoking Status  . Never Smoker  Smokeless Tobacco  . Never Used     Problems Prior to Visit 1. HTN - was to start checking bp outside of office as uncontrolled. Increased hctz -> maxzide at last visit, on lisinopril 40 2.  DM - last seen 1 mo prior for review of chronic medical problems, on metformin 1025m bid a1c had decreased from 14 -> 7.7 last mo. Her last eye exam was in June, 2015. She has signed up for a clinical trial for DM next wk - lasts for 10 wks. 3.  Overactive bladder - we increased her ditropan from 10 to 15 at last OV 4.  HPL - pt restarted pravastatin 40 at visit 1-2 mos ago - to soon to get lipid panel 5.  Lower extremity myopathy - did not improve off statin for a mo, poss due to low K or edema, started trial of magnesium qhs and if failed then zanaflex qhs.  Has been on medicare since she was 39.  Current Problems (verified) Patient Active Problem List   Diagnosis Date Noted  . Sinus congestion 11/29/2015  . Bilateral leg edema 09/25/2015  . Abdominal pain, left upper quadrant 09/25/2015  . 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 Prednisone adverse reaction 08/20/2015  . Abdominal pain, chronic, epigastric 06/30/2015  . Osteoarthritis of right foot 03/22/2015  . Type 2 diabetes mellitus with diabetic polyneuropathy, without long-term current use of insulin ([facility] 03/20/2015  . BMI 40.0-44.9, adult ([facility] 06/13/2013  . Overactive bladder 06/13/2013  . Allergic rhinitis 06/13/2013  . Asthma, moderate persistent 06/13/2013  . BIPOLAR DISORDER UNSPECIFIED 03/18/2008  . PERIPHERAL EDEMA 07/03/2007  . GERD 01/30/2007  . HYPERCHOLESTEROLEMIA 12/07/2006  . Essential hypertension  12/07/2006  . Osteoarthritis of right knee 12/07/2006  . ANEMIA, IRON DEFICIENCY 01/06/2006    Medications Prior to Visit Current Outpatient Prescriptions on File Prior to Visit  Medication Sig Dispense Refill  . ACCU-CHEK SOFTCLIX LANCETS lancets Test blood sugar once daily. Dx: E11.42 100 each 3  . ADVAIR DISKUS 250-50 MCG/DOSE AEPB Inhale 1 puff by mouth into the lungs two times daily 180 each 0  . amoxicillin (AMOXIL) 500 MG capsule Take 2 capsules (1,000 mg total) by mouth 2 (two) times daily. 40 capsule 0  . Blood Glucose Calibration (ACCU-CHEK AVIVA) SOLN Test blood sugar once daily. Dx: E11.42 1 each 3  . blood glucose meter kit and supplies KIT Use as directed 4 times a day.  DX: R73.09 1 each 0  . Blood Glucose Monitoring Suppl (ACCU-CHEK AVIVA PLUS) w/Device KIT Test blood sugar once daily. Dx: E11.42 1 kit 0  . cetirizine (ZYRTEC ALLERGY) 10 MG tablet Take 1 tablet (10 mg total) by mouth daily. 30 tablet 1  . colchicine 0.6 MG tablet Take 2 tablets with onset of gout, then 1 tablet every 12 hours until gout flare resolves 30 tablet 0  . diclofenac sodium (VOLTAREN) 1 % GEL Apply 4 g topically 2 (two) times daily. (Patient taking differently: Apply 4 g topically 2 (two) times daily as needed (pain). ) 100 g 1  . fluticasone (FLONASE) 50 MCG/ACT nasal spray PLACE 2 SPRAYS INTO BOTH NOSTRILS AT BEDTIME. (Patient not taking: Reported on 10/09/2015)  48 g 3  . furosemide (LASIX) 40 MG tablet Take 1 tablet (40 mg total) by mouth daily. 15 tablet 0  . glucose blood (ACCU-CHEK AVIVA PLUS) test strip Test blood sugar once daily. Dx: E11.42 100 each 3  . hydrOXYzine (ATARAX/VISTARIL) 25 MG tablet Take 25 mg by mouth every 6 (six) hours as needed for anxiety or itching.     Marland Kitchen ibuprofen (ADVIL,MOTRIN) 400 MG tablet Take 1 tablet (400 mg total) by mouth every 6 (six) hours as needed. 30 tablet 0  . ipratropium (ATROVENT HFA) 17 MCG/ACT inhaler Inhale 2 puffs into the lungs every 4 (four) hours as  needed for wheezing. 1 Inhaler 0  . lisinopril (PRINIVIL,ZESTRIL) 40 MG tablet Take 1 tablet by mouth  daily 90 tablet 0  . loperamide (IMODIUM) 2 MG capsule Take 1 capsule (2 mg total) by mouth 4 (four) times daily as needed for diarrhea or loose stools. 12 capsule 0  . meloxicam (MOBIC) 15 MG tablet Take 1 tablet (15 mg total) by mouth daily. 30 tablet 0  . metFORMIN (GLUCOPHAGE) 1000 MG tablet Take 1 tablet (1,000 mg total) by mouth 2 (two) times daily with a meal. 180 tablet 2  . oxybutynin (DITROPAN XL) 15 MG 24 hr tablet Take 1 tablet by mouth at  bedtime 90 tablet 0  . polyethylene glycol powder (MIRALAX) powder Take 17 g by mouth daily. 255 g 0  . pravastatin (PRAVACHOL) 40 MG tablet Take 1 tablet by mouth  daily 90 tablet 0  . sucralfate (CARAFATE) 1 GM/10ML suspension Take 10 mLs (1 g total) by mouth 4 (four) times daily -  with meals and at bedtime. 420 mL 0  . tiZANidine (ZANAFLEX) 4 MG tablet TAKE 1 TABLET (4 MG TOTAL) BY MOUTH AT BEDTIME. 30 tablet 0  . triamterene-hydrochlorothiazide (MAXZIDE-25) 37.5-25 MG tablet Take 1 tablet by mouth daily.     . VENTOLIN HFA 108 (90 Base) MCG/ACT inhaler Inhale 2 puffs by mouth  every 6 hours as needed for wheezing 1 Inhaler 0  . ziprasidone (GEODON) 40 MG capsule Take 40 mg by mouth daily.  1   No current facility-administered medications on file prior to visit.     Current Medications (verified) Current Outpatient Prescriptions  Medication Sig Dispense Refill  . ACCU-CHEK SOFTCLIX LANCETS lancets Test blood sugar once daily. Dx: E11.42 100 each 3  . ADVAIR DISKUS 250-50 MCG/DOSE AEPB Inhale 1 puff by mouth into the lungs two times daily 180 each 0  . amoxicillin (AMOXIL) 500 MG capsule Take 2 capsules (1,000 mg total) by mouth 2 (two) times daily. 40 capsule 0  . Blood Glucose Calibration (ACCU-CHEK AVIVA) SOLN Test blood sugar once daily. Dx: E11.42 1 each 3  . blood glucose meter kit and supplies KIT Use as directed 4 times a day.  DX:  R73.09 1 each 0  . Blood Glucose Monitoring Suppl (ACCU-CHEK AVIVA PLUS) w/Device KIT Test blood sugar once daily. Dx: E11.42 1 kit 0  . cetirizine (ZYRTEC ALLERGY) 10 MG tablet Take 1 tablet (10 mg total) by mouth daily. 30 tablet 1  . colchicine 0.6 MG tablet Take 2 tablets with onset of gout, then 1 tablet every 12 hours until gout flare resolves 30 tablet 0  . diclofenac sodium (VOLTAREN) 1 % GEL Apply 4 g topically 2 (two) times daily. (Patient taking differently: Apply 4 g topically 2 (two) times daily as needed (pain). ) 100 g 1  . fluticasone (FLONASE) 50 MCG/ACT nasal spray  PLACE 2 SPRAYS INTO BOTH NOSTRILS AT BEDTIME. (Patient not taking: Reported on 10/09/2015) 48 g 3  . furosemide (LASIX) 40 MG tablet Take 1 tablet (40 mg total) by mouth daily. 15 tablet 0  . glucose blood (ACCU-CHEK AVIVA PLUS) test strip Test blood sugar once daily. Dx: E11.42 100 each 3  . hydrOXYzine (ATARAX/VISTARIL) 25 MG tablet Take 25 mg by mouth every 6 (six) hours as needed for anxiety or itching.     Marland Kitchen ibuprofen (ADVIL,MOTRIN) 400 MG tablet Take 1 tablet (400 mg total) by mouth every 6 (six) hours as needed. 30 tablet 0  . ipratropium (ATROVENT HFA) 17 MCG/ACT inhaler Inhale 2 puffs into the lungs every 4 (four) hours as needed for wheezing. 1 Inhaler 0  . lisinopril (PRINIVIL,ZESTRIL) 40 MG tablet Take 1 tablet by mouth  daily 90 tablet 0  . loperamide (IMODIUM) 2 MG capsule Take 1 capsule (2 mg total) by mouth 4 (four) times daily as needed for diarrhea or loose stools. 12 capsule 0  . meloxicam (MOBIC) 15 MG tablet Take 1 tablet (15 mg total) by mouth daily. 30 tablet 0  . metFORMIN (GLUCOPHAGE) 1000 MG tablet Take 1 tablet (1,000 mg total) by mouth 2 (two) times daily with a meal. 180 tablet 2  . oxybutynin (DITROPAN XL) 15 MG 24 hr tablet Take 1 tablet by mouth at  bedtime 90 tablet 0  . polyethylene glycol powder (MIRALAX) powder Take 17 g by mouth daily. 255 g 0  . pravastatin (PRAVACHOL) 40 MG tablet  Take 1 tablet by mouth  daily 90 tablet 0  . sucralfate (CARAFATE) 1 GM/10ML suspension Take 10 mLs (1 g total) by mouth 4 (four) times daily -  with meals and at bedtime. 420 mL 0  . tiZANidine (ZANAFLEX) 4 MG tablet TAKE 1 TABLET (4 MG TOTAL) BY MOUTH AT BEDTIME. 30 tablet 0  . triamterene-hydrochlorothiazide (MAXZIDE-25) 37.5-25 MG tablet Take 1 tablet by mouth daily.     . VENTOLIN HFA 108 (90 Base) MCG/ACT inhaler Inhale 2 puffs by mouth  every 6 hours as needed for wheezing 1 Inhaler 0  . ziprasidone (GEODON) 40 MG capsule Take 40 mg by mouth daily.  1   No current facility-administered medications for this visit.      Allergies (verified) Fish allergy; Banana; Other; and Prednisone   PAST HISTORY  Family History Family History  Problem Relation Age of Onset  . Hypertension Mother   . Diabetes Mother   . Hyperlipidemia Mother   . Stroke Mother   . Heart disease Mother   . Pulmonary embolism Mother   . Hypertension Father   . Diabetes Father   . Hyperlipidemia Father     Social History Social History  Substance Use Topics  . Smoking status: Never Smoker  . Smokeless tobacco: Never Used  . Alcohol use 0.0 oz/week     Comment: Wine, Socially      Are there smokers in your home (other than you)? YES  Risk Factors Current exercise habits: The patient does not participate in regular exercise at present.  Dietary issues discussed: low carb - diabetic, heart health   Cardiac risk factors: diabetes mellitus, dyslipidemia, hypertension, obesity (BMI >= 30 kg/m2) and sedentary lifestyle.  Depression Screen (Note: if answer to either of the following is "Yes", a more complete depression screening is indicated)   Over the past two weeks, have you felt down, depressed or hopeless? No  Over the past two weeks, have you  felt little interest or pleasure in doing things? No  Have you lost interest or pleasure in daily life? No  Do you often feel hopeless? No  Do you cry easily  over simple problems? No Depression screen [facility] 2/9 09/25/2015 08/31/2015 08/24/2015 08/22/2015 08/10/2015  Decreased Interest 0 0 0 0 0  Down, Depressed, Hopeless 0 0 0 0 0  PHQ - 2 Score 0 0 0 0 0     Activities of Daily Living In your present state of health, do you have any difficulty performing the following activities?:  Driving? No Managing money?  No Feeding yourself? No Getting from bed to chair? No Climbing a flight of stairs? No Preparing food and eating?: No Bathing or showering? No Getting dressed: No Getting to the toilet? No Using the toilet:No Moving around from place to place: No In the past year have you fallen or had a near fall?:No   Are you sexually active?  No  Do you have more than one partner?  No  Hearing Difficulties: No Do you often ask people to speak up or repeat themselves? No Do you experience ringing or noises in your ears? No Do you have difficulty understanding soft or whispered voices? No   Do you feel that you have a problem with memory? No  Do you often misplace items? No  Do you feel safe at home?  Yes  Cognitive Testing  Alert? Yes  Normal Appearance?Yes  Oriented to person? Yes  Place? Yes   Time? Yes  Recall of three objects?  Yes  Can perform simple calculations? Yes  Displays appropriate judgment?Yes  Can read the correct time from a watch face?Yes   Advanced Directives have been discussed with the patient? Yes  List the Names of Other Physician/Practitioners you currently use: 1.  See Care teams  Indicate any recent Medical Services you may have received from other than [facility] providers in the past year (date may be approximate).  Immunization History  Administered Date(s) Administered  . Influenza Split 11/28/2014  . Influenza Whole 12/05/2005, 01/30/2007, 12/19/2007  . Pneumococcal Polysaccharide-23 08/05/2004  . Td 12/05/2005    Screening Tests Health Maintenance  Topic Date Due  . PNEUMOCOCCAL POLYSACCHARIDE VACCINE  (2) 08/05/2009  . COLONOSCOPY  02/04/2010  . MAMMOGRAM  08/06/2014  . INFLUENZA VACCINE  10/06/2015  . OPHTHALMOLOGY EXAM  10/09/2015  . TETANUS/TDAP  12/06/2015  . FOOT EXAM  01/23/2016  . HEMOGLOBIN A1C  03/27/2016  . PAP SMEAR  02/25/2018  . Hepatitis C Screening  Completed  . HIV Screening  Completed    All answers were reviewed with the patient and necessary referrals were made:  Hadi Dubin, MD   02/17/2016   History reviewed: allergies, current medications, past family history, past medical history, past social history, past surgical history and problem list  Review of Systems A comprehensive review of systems was negative except for: Respiratory: positive for dyspnea on exertion Genitourinary: positive for frequency Musculoskeletal: positive for arthralgias Allergic/Immunologic: positive for hay fever and urticaria  seasonal and food   Objective:      No exam data present LMP 05/30/2013    General Appearance:    Alert, cooperative, no distress, appears stated age  Head:    Normocephalic, without obvious abnormality, atraumatic  Eyes:    PERRL, conjunctiva/corneas clear, EOM's intact, fundi    benign, both eyes  Ears:    Normal TM's and external ear canals, both ears  Nose:   Nares normal, septum midline,  mucosa normal, no drainage    or sinus tenderness  Throat:   Lips, mucosa, and tongue normal; teeth and gums normal  Neck:   Supple, symmetrical, trachea midline, no adenopathy;    thyroid:  no enlargement/tenderness/nodules; no carotid   bruit or JVD  Back:     Symmetric, no curvature, ROM normal, no CVA tenderness  Lungs:     Clear to auscultation bilaterally, respirations unlabored  Chest Wall:    No tenderness or deformity   Heart:    Regular rate and rhythm, S1 and S2 normal, no murmur, rub   or gallop  Breast Exam:    No tenderness, masses, or nipple abnormality  Abdomen:     Soft, non-tender, bowel sounds active all four quadrants,    no masses, no  organomegaly  Genitalia:    Normal female without lesion, discharge or tenderness  Rectal:    Normal tone, normal prostate, no masses or tenderness;   guaiac negative stool  Extremities:   Extremities normal, atraumatic, no cyanosis or edema  Pulses:   2+ and symmetric all extremities  Skin:   Skin color, texture, turgor normal, no rashes or lesions  Lymph nodes:   Cervical, supraclavicular, and axillary nodes normal  Neurologic:   CNII-XII intact, normal strength, sensation and reflexes    throughout       Assessment:     1. Medicare annual wellness visit, subsequent - has [employer] coverage - will drop off formulary if needed  2. Screening for cervical cancer   3. Screening for breast cancer   4. Postmenopausal estrogen deficiency   5. Essential hypertension   6. Allergic rhinitis, unspecified allergic rhinitis type   7. Asthma, moderate persistent, uncomplicated   8. HYPERCHOLESTEROLEMIA   9. Morbid obesity, unspecified obesity type ([place])   10. Vaginal leukorrhea   11. Screening for cardiovascular, respiratory, and genitourinary diseases   12. Screening for colorectal cancer   13. Bacterial vaginosis          Plan:     During the course of the visit the patient was educated and counseled about appropriate screening and preventive services including:    Pneumococcal vaccine  - pneumovax 2006  Influenza vaccine - had this yr  Td vaccine - done 12/2005  Screening electrocardiogram - done 11/16  Screening mammography - 2014 - done at [facility] and will cont doing there  Screening Pap smear and pelvic exam - done today, last was 2014.  ASC-US pap 01/06/2006 with many normal since  Bone densitometry screening - not indicated until 55 yo, reviewed ca/vit D and weight bearing exercise recs  Colorectal cancer screening - referred as none prior  Diabetes screening - has  Glaucoma screening - sees optho annually  Nutrition counseling   Had had the hep c and hiv     Patient notes that she has had normal pap smears since her abnormal pap smear, although she did not remember having an abnormal pap smear. She had her last mammogram at [facility] and plans to have future mammograms done there. She has not had a colonoscopy done. Patient denies hx of smoking, but she does have ongoing passive smoke exposure.    Diet review for nutrition referral? Yes ____  Not Indicated _x___   Patient Instructions (the written plan) was given to the patient.  Medicare Attestation I have personally reviewed: The patient's medical and social history Their use of alcohol, tobacco or illicit drugs Their current medications and supplements  The patient's functional ability including ADLs,fall risks, home safety risks, cognitive, and hearing and visual impairment Diet and physical activities Evidence for depression or mood disorders  The patient's weight, height, BMI, and visual acuity have been recorded in the chart.  I have made referrals, counseling, and provided education to the patient based on review of the above and I have provided the patient with a written personalized care plan for preventive services.     Ariona Deschene, MD   02/17/2016    No orders of the defined types were placed in this encounter.   No orders of the defined types were placed in this encounter.  Results for orders placed or performed during the hospital encounter of 02/17/16  CBC with Differential  Result Value Ref Range   WBC 10.7 (H) 4.0 - 10.5 K/uL   RBC 4.03 3.87 - 5.11 MIL/uL   Hemoglobin 10.7 (L) 12.0 - 15.0 g/dL   HCT 32.8 (L) 36.0 - 46.0 %   MCV 81.4 78.0 - 100.0 fL   MCH 26.6 26.0 - 34.0 pg   MCHC 32.6 30.0 - 36.0 g/dL   RDW 16.6 (H) 11.5 - 15.5 %   Platelets 310 150 - 400 K/uL   Neutrophils Relative % 66 %   Neutro Abs 7.1 1.7 - 7.7 K/uL   Lymphocytes Relative 24 %   Lymphs Abs 2.5 0.7 - 4.0 K/uL   Monocytes Relative 7 %   Monocytes Absolute 0.7 0.1 - 1.0 K/uL    Eosinophils Relative 3 %   Eosinophils Absolute 0.3 0.0 - 0.7 K/uL   Basophils Relative 0 %   Basophils Absolute 0.0 0.0 - 0.1 K/uL  Comprehensive metabolic panel  Result Value Ref Range   Sodium 139 135 - 145 mmol/L   Potassium 3.4 (L) 3.5 - 5.1 mmol/L   Chloride 103 101 - 111 mmol/L   CO2 25 22 - 32 mmol/L   Glucose, Bld 151 (H) 65 - 99 mg/dL   BUN 8 6 - 20 mg/dL   Creatinine, Ser 1.06 (H) 0.44 - 1.00 mg/dL   Calcium 8.9 8.9 - 10.3 mg/dL   Total Protein 6.8 6.5 - 8.1 g/dL   Albumin 3.1 (L) 3.5 - 5.0 g/dL   AST 16 15 - 41 U/L   ALT 12 (L) 14 - 54 U/L   Alkaline Phosphatase 73 38 - 126 U/L   Total Bilirubin 0.7 0.3 - 1.2 mg/dL   GFR calc non Af Amer 58 (L) >60 mL/min   GFR calc Af Amer >60 >60 mL/min   Anion gap 11 5 - 15

## 2016-02-17 NOTE — Care Management Note (Signed)
Case Management Note  Patient Details  Name: Melissa Chavez MRN: [id number] Date of Birth: 09/13/1960  Subjective/Objective:                  55 y.o. female with a hx of gout, arthritis to the knee, hypertension, and DM who presents to the Emergency Department complaining of chronic moderate right knee pain with swelling that has been ongoing for several months. From home with adult children.    Action/Plan: Follow for disposition needs.   Expected Discharge Date:  02/17/16               Expected Discharge Plan:  Home/Self Care  In-House Referral:  NA  Discharge planning Services  CM Consult  Post Acute Care Choice:    Choice offered to:  NA  DME Arranged:  N/A DME Agency:  NA  HH Arranged:  NA HH Agency:  NA  Status of Service:  Completed, signed off    Additional Comments: Pt up for discharge. [facility] possible [facility] needs.  No needs identified or communicated.  Oletta CohnWood, Kieffer Blatz, RN 02/17/2016, 9:26 AM

## 2016-02-17 NOTE — Telephone Encounter (Signed)
Pt called stating she was to get a Rx for potassium.  EDCM reviewed chart to find that no potassium Rx was written or mentioned (noted potassium lab 3.4).  Advised pt that Dr Preston FleetingGlick will not return to ER until Friday per schedule and she could either contact PCP for potassium Rx or wait until Friday when Dr Preston FleetingGlick returns.  Pt states she will contact PCP.  Pt appreciated follow through.

## 2016-02-18 ENCOUNTER — Other Ambulatory Visit: Payer: Self-pay | Admitting: *Deleted

## 2016-02-18 ENCOUNTER — Encounter: Payer: Self-pay | Admitting: *Deleted

## 2016-02-18 ENCOUNTER — Encounter: Payer: Medicare Other | Admitting: Family Medicine

## 2016-02-18 NOTE — Patient Outreach (Signed)
Telephone assessment attempted without success. Pt has had 2 ED visits since my call on 02/12/16. I left a message and requested that she return my call so that I may be able to schedule a home visit.  Melissa Chavez [facility]orne[facility]NP-BC [facility] Care Manager [phone]

## 2016-02-18 NOTE — Patient Outreach (Addendum)
Telephone assessment. Pt has been previously engaged with Nigel MormonVictoria Satterfield, RN, Futures traderCare Manager. She is currently on LOA and I am covering her pt's. I was able to talk to Ms. Murillo today. She required a lot of information about [facility], her previous nurse, and why she is being engaged. I advised her that I am trying to offer some continuity of care. I told her I noted she had been to the ED 2 times since my initial call on 02/11/16 and that concerns me. I requested an opportunity for a home visit and she did not want to agree to this. She also said, 'It is a busy time for me, it's the holidays, I'm moving, I also like my privacy." I told her that was OK. I asked if I could call her weekly through the holidays? She agreed to this. I gave her my contact information and also the 24 hour nurse line. She asked if she could come to our office for a visit and I told her that would be fine.  I hope to establish a rapport with Ms. Perlie GoldRussell and advocate for her. I would like to work with her on "Who to Call When" to see if we can reduce her use of the ED.  I will call her again next week.  [facility] CM Care Plan Problem One   Flowsheet Row Most Recent Value  Care Plan Problem One  Pt needs a care manager to help her navigate the health system and assist with her medical problems.  Role Documenting the Problem One  Care Management Coordinator  Care Plan for Problem One  Active  [facility] CM Short Term Goal #1 (0-30 days)  Pt will accept telephone calls from NP each week until we can get a face to face visit scheduled over the next month..  [facility]N CM Short Term Goal #1 Start Date  02/18/16  Interventions for Short Term Goal #1  Gave information about [facility], asked pt to engage (she agreed). Gave pt my contact info and 24 hour nurse line.    [facility] CM Care Plan Problem Two   Flowsheet Row Most Recent Value  Care Plan Problem Two  Muliple inappropriate ED visits.  Role Documenting the Problem Two  Care Management Coordinator  Care  Plan for Problem Two  Active  Interventions for Problem Two Long Term Goal   Gave pt contact info and asked her to call me or 24 hour nurse line for problems.  [facility]N Long Term Goal (31-90) days  Pt will learn to call for medical problems, schedule appts for problems and follow the WHEN TO GO PROTOCOL for using the ED over the next 90 days.  [facility] Long Term Goal Start Date  02/18/16      Almetta LovelyCarroll Sephiroth Mcluckie [facility] LLCGNP-BC [facility] Care Manager [phone]

## 2016-02-25 ENCOUNTER — Other Ambulatory Visit: Payer: Self-pay | Admitting: *Deleted

## 2016-02-26 ENCOUNTER — Other Ambulatory Visit: Payer: Self-pay | Admitting: Family Medicine

## 2016-02-29 ENCOUNTER — Other Ambulatory Visit: Payer: Self-pay | Admitting: Family Medicine

## 2016-03-01 NOTE — Telephone Encounter (Signed)
Last ov 09/2015 Last lab 02/2016 and 07/2015 last lipid

## 2016-03-03 ENCOUNTER — Other Ambulatory Visit: Payer: Self-pay | Admitting: *Deleted

## 2016-03-03 NOTE — Patient Outreach (Signed)
Telephone assessment. I am slowly making progress with establishing a rapport with Ms. Melissa Chavez. Today she reports she has knee pain and she was supposed to be having surgery but she was dismissed from her orthopedic MD because of an unpaid bill. She has tried to get a new orthopedist and she has been stalled on that process because she needs a referral.  She also tells me she has elbow pain. She is taking APAP and using Voltaren gel routinely. She has gotten a steroid injection in it before but it was not successful in relieving her pain significantly.  She also reports being anemic and needing this checked out.  I have suggested she should schedule an appt with Dr. Hyacinth MeekerMiller or go to the Walk In clinic. I have asked her to write down her items that she needs to have addressed and give them the list when she checks in so that these issues can be addressed.  Pt also tells me her mother with dementia is moving in with her and her 2 daughters ([phone]). She is trying to find affordable housing.I asked about her mothers condition, insurance and MD and asked Roanna RaiderSherri, if is OK if I find out if she is also eligible [facility] and she said that would be great. I will pursue this.  I will be calling Ms. Melissa Chavez again next week.  Almetta LovelyCarroll Sheilah Rayos [facility] RichardsonGNP-BC [facility] Care Manager [phone]

## 2016-03-06 ENCOUNTER — Encounter (HOSPITAL_COMMUNITY): Payer: Self-pay

## 2016-03-06 ENCOUNTER — Emergency Department (HOSPITAL_COMMUNITY): Payer: Medicare Other

## 2016-03-06 ENCOUNTER — Observation Stay (HOSPITAL_COMMUNITY)
Admission: EM | Admit: 2016-03-06 | Discharge: 2016-03-09 | Disposition: A | Discharge status: Home / Self Care | Payer: Medicare Other | Attending: Internal Medicine | Admitting: Internal Medicine

## 2016-03-06 DIAGNOSIS — Z7984 Long term (current) use of oral hypoglycemic drugs: Secondary | ICD-10-CM | POA: Insufficient documentation

## 2016-03-06 DIAGNOSIS — R262 Difficulty in walking, not elsewhere classified: Secondary | ICD-10-CM | POA: Insufficient documentation

## 2016-03-06 DIAGNOSIS — R000 Tachycardia, unspecified: Secondary | ICD-10-CM | POA: Diagnosis not present

## 2016-03-06 DIAGNOSIS — Z79899 Other long term (current) drug therapy: Secondary | ICD-10-CM | POA: Insufficient documentation

## 2016-03-06 DIAGNOSIS — I10 Essential (primary) hypertension: Secondary | ICD-10-CM | POA: Diagnosis not present

## 2016-03-06 DIAGNOSIS — E1142 Type 2 diabetes mellitus with diabetic polyneuropathy: Secondary | ICD-10-CM | POA: Diagnosis not present

## 2016-03-06 DIAGNOSIS — J45909 Unspecified asthma, uncomplicated: Secondary | ICD-10-CM | POA: Insufficient documentation

## 2016-03-06 DIAGNOSIS — M25561 Pain in right knee: Secondary | ICD-10-CM | POA: Diagnosis present

## 2016-03-06 DIAGNOSIS — N3281 Overactive bladder: Secondary | ICD-10-CM | POA: Diagnosis present

## 2016-03-06 DIAGNOSIS — M109 Gout, unspecified: Secondary | ICD-10-CM | POA: Diagnosis not present

## 2016-03-06 DIAGNOSIS — M1711 Unilateral primary osteoarthritis, right knee: Secondary | ICD-10-CM | POA: Diagnosis not present

## 2016-03-06 DIAGNOSIS — L8990 Pressure ulcer of unspecified site, unspecified stage: Secondary | ICD-10-CM | POA: Diagnosis present

## 2016-03-06 DIAGNOSIS — J4540 Moderate persistent asthma, uncomplicated: Secondary | ICD-10-CM | POA: Diagnosis present

## 2016-03-06 LAB — BASIC METABOLIC PANEL
Anion gap: 10 (ref 5–15)
BUN: 14 mg/dL (ref 6–20)
CO2: 27 mmol/L (ref 22–32)
Calcium: 8.9 mg/dL (ref 8.9–10.3)
Chloride: 95 mmol/L — ABNORMAL LOW (ref 101–111)
Creatinine, Ser: 0.98 mg/dL (ref 0.44–1.00)
GFR calc Af Amer: >60 mL/min (ref >60)
GFR calc non Af Amer: >60 mL/min (ref >60)
Glucose, Bld: 205 mg/dL — ABNORMAL HIGH (ref 65–99)
Potassium: 3.9 mmol/L (ref 3.5–5.1)
Sodium: 132 mmol/L — ABNORMAL LOW (ref 135–145)

## 2016-03-06 LAB — CBC WITH DIFFERENTIAL/PLATELET
Basophils Absolute: 0 10*3/uL (ref 0.0–0.1)
Basophils Relative: 0 %
Eosinophils Absolute: 0 10*3/uL (ref 0.0–0.7)
Eosinophils Relative: 0 %
HCT: 33.2 % — ABNORMAL LOW (ref 36.0–46.0)
Hemoglobin: 11 g/dL — ABNORMAL LOW (ref 12.0–15.0)
Lymphocytes Relative: 11 %
Lymphs Abs: 2.2 10*3/uL (ref 0.7–4.0)
MCH: 26.1 pg (ref 26.0–34.0)
MCHC: 33.1 g/dL (ref 30.0–36.0)
MCV: 78.7 fL (ref 78.0–100.0)
Monocytes Absolute: 1.1 10*3/uL — ABNORMAL HIGH (ref 0.1–1.0)
Monocytes Relative: 5 %
Neutro Abs: 17 10*3/uL — ABNORMAL HIGH (ref 1.7–7.7)
Neutrophils Relative %: 84 %
Platelets: 485 10*3/uL — ABNORMAL HIGH (ref 150–400)
RBC: 4.22 MIL/uL (ref 3.87–5.11)
RDW: 16.3 % — ABNORMAL HIGH (ref 11.5–15.5)
WBC: 20.3 10*3/uL — ABNORMAL HIGH (ref 4.0–10.5)

## 2016-03-06 LAB — URIC ACID: Uric Acid, Serum: 7.9 mg/dL — ABNORMAL HIGH (ref 2.3–6.6)

## 2016-03-06 LAB — SEDIMENTATION RATE: Sed Rate: 101 mm/hr — ABNORMAL HIGH (ref 0–22)

## 2016-03-06 MED ORDER — MORPHINE SULFATE (PF) 4 MG/ML IV SOLN
4.0000 mg | Freq: Once | INTRAVENOUS | Status: AC
  Administered 2016-03-06: 4 mg via INTRAVENOUS
  Filled 2016-03-06: qty 1

## 2016-03-06 MED ORDER — ONDANSETRON HCL 4 MG/2ML IJ SOLN
4.0000 mg | Freq: Once | INTRAMUSCULAR | Status: AC
  Administered 2016-03-06: 4 mg via INTRAVENOUS
  Filled 2016-03-06: qty 2

## 2016-03-06 NOTE — ED Notes (Signed)
Patient transported to X-ray 

## 2016-03-06 NOTE — ED Provider Notes (Signed)
[facility] DEPT Provider Note   CSN: [id number] Arrival date & time: 03/06/16  1729  By signing my name below, I, Jeanell Sparrow, attest that this documentation has been prepared under the direction and in the presence of non-physician practitioner, Alecia Lemming, PA-C. Electronically Signed: Jeanell Sparrow, Scribe. 03/06/2016. 9:13 PM.  History   Chief Complaint Chief Complaint  Patient presents with  . Knee Pain  . Foot Swelling   The history is provided by the patient. No language interpreter was used.   HPI Comments: Melissa Chavez is a 55 y.o. female with presumed history of gout, improved with colchicine in the past, DM -- who presents to the Emergency Department complaining of constant moderate bilateral knee pain that started about 3 days ago. She states she was seen by another provider and given tramadol for left elbow pain -- elbow pain has improved. She has been having BLE pain and stiffness since after taking the medication. Knee pain is severe, L>R. She reports associated bilateral ankle pain and bilateral foot swelling with color change. She cannot walk due to knee and foot swelling and pain. Her pain is exacerbated by movement and palpation. She denies any other complaints.     PCP: Tawanna Solo, MD  Past Medical History:  Diagnosis Date  . Allergy   . Anemia   . Asthma   . Bipolar 2 disorder ([place])   . Diabetes mellitus without complication ([place])   . GERD (gastroesophageal reflux disease)   . Glaucoma    Pt denies  . Hernia   . Hypertension     Patient Active Problem List   Diagnosis Date Noted  . Gout 03/07/2016  . Sinus congestion 11/29/2015  . Bilateral leg edema 09/25/2015  . Abdominal pain, left upper quadrant 09/25/2015  . 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 Prednisone adverse reaction 08/20/2015  . Abdominal pain, chronic, epigastric 06/30/2015  . Osteoarthritis of right foot 03/22/2015  . Type 2 diabetes mellitus  with diabetic polyneuropathy, without long-term current use of insulin ([place]) 03/20/2015  . BMI 40.0-44.9, adult ([place]) 06/13/2013  . Overactive bladder 06/13/2013  . Allergic rhinitis 06/13/2013  . Asthma, moderate persistent 06/13/2013  . BIPOLAR DISORDER UNSPECIFIED 03/18/2008  . PERIPHERAL EDEMA 07/03/2007  . GERD 01/30/2007  . HYPERCHOLESTEROLEMIA 12/07/2006  . Essential hypertension 12/07/2006  . Osteoarthritis of right knee 12/07/2006  . ANEMIA, IRON DEFICIENCY 01/06/2006    Past Surgical History:  Procedure Laterality Date  . HERNIA REPAIR    . TONSILLECTOMY     Pt denies  . TUBAL LIGATION      OB History    No data available       Home Medications    Prior to Admission medications   Medication Sig Start Date End Date Taking? Authorizing Provider  ACCU-CHEK SOFTCLIX LANCETS lancets Test blood sugar once daily. Dx: E11.42 10/21/15   Wardell Honour, MD  ADVAIR DISKUS 250-50 MCG/DOSE AEPB Inhale 1 puff by mouth into the lungs two times daily 07/01/15   Shawnee Knapp, MD  amoxicillin (AMOXIL) 500 MG capsule Take 2 capsules (1,000 mg total) by mouth 2 (two) times daily. 89/37/34   Delora Fuel, MD  Blood Glucose Calibration (ACCU-CHEK AVIVA) SOLN Test blood sugar once daily. Dx: E11.42 10/21/15   Wardell Honour, MD  blood glucose meter kit and supplies KIT Use as directed 4 times a day.  DX: R73.09 06/13/14   Shawnee Knapp, MD  Blood Glucose Monitoring Suppl (ACCU-CHEK  AVIVA PLUS) w/Device KIT Test blood sugar once daily. Dx: E11.42 10/21/15   Wardell Honour, MD  cetirizine (ZYRTEC ALLERGY) 10 MG tablet Take 1 tablet (10 mg total) by mouth daily. 11/29/15   Montine Circle, PA-C  colchicine 0.6 MG tablet Take 2 tablets with onset of gout, then 1 tablet every 12 hours until gout flare resolves 01/29/16   Delos Haring, PA-C  diclofenac sodium (VOLTAREN) 1 % GEL Apply 4 g topically 2 (two) times daily. Patient taking differently: Apply 4 g topically 2 (two) times daily as needed (pain).   06/30/15   Darlyne Russian, MD  fluticasone (FLONASE) 50 MCG/ACT nasal spray PLACE 2 SPRAYS INTO BOTH NOSTRILS AT BEDTIME. Patient not taking: Reported on 10/09/2015 07/28/15   Shawnee Knapp, MD  furosemide (LASIX) 40 MG tablet Take 1 tablet (40 mg total) by mouth daily. 81/27/51   Delora Fuel, MD  glucose blood (ACCU-CHEK AVIVA PLUS) test strip Test blood sugar once daily. Dx: E11.42 10/21/15   Wardell Honour, MD  hydrOXYzine (ATARAX/VISTARIL) 25 MG tablet Take 25 mg by mouth every 6 (six) hours as needed for anxiety or itching.  08/24/15   Historical Provider, MD  ibuprofen (ADVIL,MOTRIN) 400 MG tablet Take 1 tablet (400 mg total) by mouth every 6 (six) hours as needed. 11/29/15   Montine Circle, PA-C  ipratropium (ATROVENT HFA) 17 MCG/ACT inhaler Inhale 2 puffs into the lungs every 4 (four) hours as needed for wheezing. 08/05/15   April Palumbo, MD  lisinopril (PRINIVIL,ZESTRIL) 40 MG tablet TAKE 1 TABLET BY MOUTH  DAILY 03/02/16   Wardell Honour, MD  loperamide (IMODIUM) 2 MG capsule Take 1 capsule (2 mg total) by mouth 4 (four) times daily as needed for diarrhea or loose stools. 01/29/16   Tiffany Carlota Raspberry, PA-C  meloxicam (MOBIC) 15 MG tablet Take 1 tablet (15 mg total) by mouth daily. 01/29/16   Tiffany Carlota Raspberry, PA-C  metFORMIN (GLUCOPHAGE) 1000 MG tablet Take 1 tablet (1,000 mg total) by mouth 2 (two) times daily with a meal. 05/12/15   Shawnee Knapp, MD  oxybutynin (DITROPAN XL) 15 MG 24 hr tablet TAKE 1 TABLET BY MOUTH AT  BEDTIME 03/01/16   Chelle Jeffery, PA-C  polyethylene glycol powder (MIRALAX) powder Take 17 g by mouth daily. 10/14/15   April Palumbo, MD  pravastatin (PRAVACHOL) 40 MG tablet TAKE 1 TABLET BY MOUTH  DAILY 03/01/16   Chelle Jeffery, PA-C  sucralfate (CARAFATE) 1 GM/10ML suspension Take 10 mLs (1 g total) by mouth 4 (four) times daily -  with meals and at bedtime. 10/14/15   April Palumbo, MD  tiZANidine (ZANAFLEX) 4 MG tablet TAKE 1 TABLET (4 MG TOTAL) BY MOUTH AT BEDTIME. 01/15/16   Chelle  Jeffery, PA-C  triamterene-hydrochlorothiazide (MAXZIDE-25) 37.5-25 MG tablet Take 1 tablet by mouth daily.  07/20/15   Historical Provider, MD  VENTOLIN HFA 108 (90 Base) MCG/ACT inhaler Inhale 2 puffs by mouth  every 6 hours as needed for wheezing 01/15/16   Chelle Jeffery, PA-C  ziprasidone (GEODON) 40 MG capsule Take 40 mg by mouth daily. 09/22/15   Historical Provider, MD    Family History Family History  Problem Relation Age of Onset  . Hypertension Mother   . Diabetes Mother   . Hyperlipidemia Mother   . Stroke Mother   . Heart disease Mother   . Pulmonary embolism Mother   . Hypertension Father   . Diabetes Father   . Hyperlipidemia Father     Social  History Social History  Substance Use Topics  . Smoking status: Never Smoker  . Smokeless tobacco: Never Used  . Alcohol use 0.0 oz/week     Comment: Wine, Socially      Allergies   Fish allergy; Banana; Other; and Prednisone   Review of Systems Review of Systems  Constitutional: Negative for fever.  HENT: Negative for rhinorrhea and sore throat.   Eyes: Negative for redness.  Respiratory: Negative for cough.   Cardiovascular: Positive for leg swelling. Negative for chest pain.  Gastrointestinal: Negative for abdominal pain, diarrhea, nausea and vomiting.  Genitourinary: Negative for dysuria.  Musculoskeletal: Positive for arthralgias, gait problem, joint swelling and myalgias.  Skin: Positive for color change. Negative for rash.  Neurological: Negative for headaches.     Physical Exam Updated Vital Signs BP 156/88 (BP Location: Right Arm)   Pulse (!) 124   Temp 99.3 F (37.4 C) (Oral)   Resp 20   Ht 5' 2" (1.575 m)   Wt 220 lb (99.8 kg)   LMP 05/30/2013   SpO2 100%   BMI 40.24 kg/m   Physical Exam  Constitutional: She appears well-developed and well-nourished. No distress.  HENT:  Head: Normocephalic and atraumatic.  Eyes: Conjunctivae are normal. Right eye exhibits no discharge. Left eye exhibits  no discharge.  Neck: Normal range of motion. Neck supple.  Cardiovascular: Regular rhythm and normal heart sounds.  Tachycardia present.   Pulses:      Dorsalis pedis pulses are 2+ on the right side, and 2+ on the left side.       Posterior tibial pulses are 2+ on the right side, and 2+ on the left side.  Pulmonary/Chest: Effort normal and breath sounds normal.  Abdominal: Soft. There is no tenderness.  Musculoskeletal: She exhibits edema and tenderness.       Right hip: Normal.       Left hip: Normal.       Right knee: She exhibits effusion. She exhibits normal range of motion. Tenderness found. Medial joint line tenderness noted.       Left knee: She exhibits decreased range of motion and effusion. Tenderness found. Medial joint line and lateral joint line tenderness noted.       Right ankle: She exhibits normal range of motion. Tenderness.       Left ankle: She exhibits normal range of motion. Tenderness.       Right upper leg: Normal.       Left upper leg: Normal.       Right lower leg: She exhibits tenderness. She exhibits no bony tenderness and no swelling.       Left lower leg: She exhibits tenderness. She exhibits no bony tenderness and no swelling.       Right foot: There is tenderness and swelling.       Left foot: There is tenderness and swelling.  Neurological: She is alert.  Skin: Skin is warm and dry.  Psychiatric: She has a normal mood and affect.  Nursing note and vitals reviewed.    ED Treatments / Results  DIAGNOSTIC STUDIES: Oxygen Saturation is 100% on RA, normal by my interpretation.    COORDINATION OF CARE: 9:17 PM- Pt advised of plan for treatment and pt agrees.  Labs (all labs ordered are listed, but only abnormal results are displayed) Labs Reviewed  CBC WITH DIFFERENTIAL/PLATELET - Abnormal; Notable for the following:       Result Value   WBC 20.3 (*)    Hemoglobin  11.0 (*)    HCT 33.2 (*)    RDW 16.3 (*)    Platelets 485 (*)    Neutro Abs 17.0  (*)    Monocytes Absolute 1.1 (*)    All other components within normal limits  BASIC METABOLIC PANEL - Abnormal; Notable for the following:    Sodium 132 (*)    Chloride 95 (*)    Glucose, Bld 205 (*)    All other components within normal limits  URIC ACID - Abnormal; Notable for the following:    Uric Acid, Serum 7.9 (*)    All other components within normal limits  SEDIMENTATION RATE - Abnormal; Notable for the following:    Sed Rate 101 (*)    All other components within normal limits  C-REACTIVE PROTEIN - Abnormal; Notable for the following:    CRP 27.3 (*)    All other components within normal limits  SYNOVIAL CELL COUNT + DIFF, W/ CRYSTALS - Abnormal; Notable for the following:    Appearance-Synovial TURBID (*)    WBC, Synovial 47,550 (*)    Neutrophil, Synovial 97 (*)    Monocyte-Macrophage-Synovial Fluid 1 (*)    All other components within normal limits  BODY FLUID CULTURE  CULTURE, BLOOD (ROUTINE X 2)  CULTURE, BLOOD (ROUTINE X 2)  MISC [facility] TEST (SEND OUT)  MISC [facility] TEST (SEND OUT)  MISC [facility] TEST (SEND OUT)    EKG  EKG Interpretation None       Radiology Dg Knee Complete 4 Views Left  Result Date: 03/06/2016 CLINICAL DATA:  Constant bilateral knee pain for 3 days, no recent trauma. EXAM: LEFT KNEE - COMPLETE 4+ VIEW COMPARISON:  None. FINDINGS: Bipartite patella. Negative acute fracture.  No bone lesion or bony destruction. Moderate osteoarthritic changes at the medial and patellofemoral compartments. Small joint effusion. IMPRESSION: Osteoarthritis.  Small joint effusion.  No acute bony abnormality. Electronically Signed   By: Andreas Newport M.D.   On: 03/06/2016 22:48   Dg Knee Complete 4 Views Right  Result Date: 03/06/2016 CLINICAL DATA:  Nontraumatic moderate knee pain for 3 days. EXAM: RIGHT KNEE - COMPLETE 4+ VIEW COMPARISON:  None. FINDINGS: Negative for acute fracture or dislocation. There is no bone lesion or bony destruction. There  is soft tissue calcification in the musculature of the distal thigh, likely due to prior trauma or inflammation. There is moderately severe osteoarthritis in the medial and patellofemoral compartments. IMPRESSION: Osteoarthritis.  No acute bony abnormality. Electronically Signed   By: Andreas Newport M.D.   On: 03/06/2016 22:49    Procedures .Joint Aspiration/Arthrocentesis Date/Time: 03/07/2016 12:30 AM Performed by: Carlisle Cater Authorized by: Carlisle Cater   Consent:    Consent obtained:  Verbal   Consent given by:  Patient   Alternatives discussed:  No treatment Location:    Location:  Knee   Knee:  L knee Anesthesia (see MAR for exact dosages):    Anesthesia method:  Local infiltration Procedure details:    Needle gauge:  18 G   Approach:  Superior   Aspirate amount:  55cc   Aspirate characteristics:  Yellow and purulent   Steroid injected: no     Specimen collected: yes   Post-procedure details:    Dressing:  Gauze roll   Patient tolerance of procedure:  Tolerated well, no immediate complications Comments:     Aspiration site anesthetized by Dr. Darl Householder.    (including critical care time)  Medications Ordered in ED Medications  morphine 4 MG/ML injection  4 mg (not administered)  ondansetron (ZOFRAN) injection 4 mg (not administered)     Initial Impression / Assessment and Plan / ED Course  I have reviewed the triage vital signs and the nursing notes.  Pertinent labs & imaging results that were available during my care of the patient were reviewed by me and considered in my medical decision making (see chart for details).  Clinical Course    Patient seen and examined. Work-up initiated. Medications ordered.   Vital signs reviewed and are as follows: BP 136/66   Pulse 112   Temp 99.3 F (37.4 C) (Oral)   Resp 15   Ht 5' 2" (1.575 m)   Wt 99.8 kg   LMP 05/30/2013   SpO2 98%   BMI 40.24 kg/m   Patient discussed with and seen by Dr. Darl Householder. We reviewed lab  results. Decision made to proceed with arthrocentesis to help diagnose.    Joint aspirate suggestive of gout. However patient has elevated lipase, remains tachycardic. After discussion with attending, decision made to give steroids and to admit patient to hospital for symptom control given that she cannot ambulate.  Patient discussed with Dr. Alcario Drought.    Pt updated and agrees with plan.    Final Clinical Impressions(s) / ED Diagnoses   Final diagnoses:  Acute gout of multiple sites, unspecified cause  Tachycardia  Inability to ambulate due to multiple joints   Admit. Severe gout including bilateral knees and feet. Patient unable to ambulate. Vitals abnormal. WBC > 20k. She is a diabetic. No fever. Admit IV steroids.   New Prescriptions Current Discharge Medication List     I personally performed the services described in this documentation, which was scribed in my presence. The recorded information has been reviewed and is accurate.     Carlisle Cater, PA-C 03/07/16 [facility], MD 03/07/16 [phone]

## 2016-03-06 NOTE — ED Triage Notes (Addendum)
PT C/O BILATERAL KNEE AND FOOT SWELLING AND PAIN SINCE Thursday NIGHT. PT STS SHE STARTED TAKING TRAMADOL FOR ELBOW PAIN ON Thursday. PT DENIES INJURY. DENIES CHEST PAIN OR SOB. PT STS SHE IS SCHEDULED FOR RIGHT KNEE SURGERY.

## 2016-03-07 DIAGNOSIS — J4540 Moderate persistent asthma, uncomplicated: Secondary | ICD-10-CM

## 2016-03-07 DIAGNOSIS — I10 Essential (primary) hypertension: Secondary | ICD-10-CM | POA: Diagnosis not present

## 2016-03-07 DIAGNOSIS — M109 Gout, unspecified: Secondary | ICD-10-CM | POA: Diagnosis not present

## 2016-03-07 DIAGNOSIS — N3281 Overactive bladder: Secondary | ICD-10-CM

## 2016-03-07 LAB — URINALYSIS, ROUTINE W REFLEX MICROSCOPIC
Bilirubin Urine: NEGATIVE
Glucose, UA: 50 mg/dL — AB
Hgb urine dipstick: NEGATIVE
Ketones, ur: NEGATIVE mg/dL
Leukocytes, UA: NEGATIVE
Nitrite: NEGATIVE
Protein, ur: NEGATIVE mg/dL
Specific Gravity, Urine: 1.008 (ref 1.005–1.030)
pH: 6 (ref 5.0–8.0)

## 2016-03-07 LAB — GLUCOSE, CAPILLARY
Glucose-Capillary: 195 mg/dL — ABNORMAL HIGH (ref 65–99)
Glucose-Capillary: 315 mg/dL — ABNORMAL HIGH (ref 65–99)
Glucose-Capillary: 271 mg/dL — ABNORMAL HIGH (ref 65–99)
Glucose-Capillary: 164 mg/dL — ABNORMAL HIGH (ref 65–99)

## 2016-03-07 LAB — MISC LABCORP TEST (SEND OUT)
Labcorp test code: 19497
Labcorp test code: 19505
Labcorp test code: 19588

## 2016-03-07 LAB — SYNOVIAL CELL COUNT + DIFF, W/ CRYSTALS
Eosinophils-Synovial: 0 % (ref 0–1)
Lymphocytes-Synovial Fld: 2 % (ref 0–20)
Monocyte-Macrophage-Synovial Fluid: 1 % — ABNORMAL LOW (ref 50–90)
Neutrophil, Synovial: 97 % — ABNORMAL HIGH (ref 0–25)
Other Cells-SYN: 0
WBC, Synovial: 47550 /mm3 — ABNORMAL HIGH (ref 0–200)

## 2016-03-07 LAB — CREATININE, SERUM
Creatinine, Ser: 0.92 mg/dL (ref 0.44–1.00)
GFR calc Af Amer: >60 mL/min (ref >60)
GFR calc non Af Amer: >60 mL/min (ref >60)

## 2016-03-07 LAB — C-REACTIVE PROTEIN: CRP: 27.3 mg/dL — ABNORMAL HIGH (ref <1.0)

## 2016-03-07 LAB — CBC
HCT: 32 % — ABNORMAL LOW (ref 36.0–46.0)
Hemoglobin: 10.9 g/dL — ABNORMAL LOW (ref 12.0–15.0)
MCH: 26.6 pg (ref 26.0–34.0)
MCHC: 34.1 g/dL (ref 30.0–36.0)
MCV: 78 fL (ref 78.0–100.0)
Platelets: 480 10*3/uL — ABNORMAL HIGH (ref 150–400)
RBC: 4.1 MIL/uL (ref 3.87–5.11)
RDW: 16.1 % — ABNORMAL HIGH (ref 11.5–15.5)
WBC: 15.7 10*3/uL — ABNORMAL HIGH (ref 4.0–10.5)

## 2016-03-07 MED ORDER — IPRATROPIUM BROMIDE 0.02 % IN SOLN: 0.5000 mg | RESPIRATORY_TRACT | Status: DC | PRN

## 2016-03-07 MED ORDER — COLCHICINE 0.6 MG PO TABS
0.6000 mg | ORAL_TABLET | Freq: Once | ORAL | Status: AC
  Administered 2016-03-07: 0.6 mg via ORAL
  Filled 2016-03-07: qty 1

## 2016-03-07 MED ORDER — ALBUTEROL SULFATE (2.5 MG/3ML) 0.083% IN NEBU: 2.5000 mg | INHALATION_SOLUTION | Freq: Four times a day (QID) | RESPIRATORY_TRACT | Status: DC | PRN

## 2016-03-07 MED ORDER — ACETAMINOPHEN 325 MG PO TABS: 650.0000 mg | ORAL_TABLET | Freq: Four times a day (QID) | ORAL | Status: DC | PRN

## 2016-03-07 MED ORDER — LORATADINE 10 MG PO TABS
10.0000 mg | ORAL_TABLET | Freq: Every day | ORAL | Status: DC
  Administered 2016-03-07 – 2016-03-09 (×3): 10 mg via ORAL
  Filled 2016-03-07 (×3): qty 1

## 2016-03-07 MED ORDER — SODIUM CHLORIDE 0.9% FLUSH
3.0000 mL | Freq: Two times a day (BID) | INTRAVENOUS | Status: DC
  Administered 2016-03-07 – 2016-03-08 (×4): 3 mL via INTRAVENOUS

## 2016-03-07 MED ORDER — ONDANSETRON HCL 4 MG/2ML IJ SOLN: 4.0000 mg | Freq: Three times a day (TID) | INTRAMUSCULAR | Status: AC | PRN

## 2016-03-07 MED ORDER — OXYBUTYNIN CHLORIDE ER 5 MG PO TB24
15.0000 mg | ORAL_TABLET | Freq: Every day | ORAL | Status: DC
  Administered 2016-03-07 – 2016-03-08 (×2): 15 mg via ORAL
  Filled 2016-03-07 (×2): qty 3

## 2016-03-07 MED ORDER — ACETAMINOPHEN 650 MG RE SUPP: 650.0000 mg | Freq: Four times a day (QID) | RECTAL | Status: DC | PRN

## 2016-03-07 MED ORDER — METHYLPREDNISOLONE SODIUM SUCC 125 MG IJ SOLR
125.0000 mg | Freq: Once | INTRAMUSCULAR | Status: AC
  Administered 2016-03-07: 125 mg via INTRAVENOUS
  Filled 2016-03-07: qty 2

## 2016-03-07 MED ORDER — COLCHICINE 0.6 MG PO TABS
1.2000 mg | ORAL_TABLET | Freq: Once | ORAL | Status: AC
  Administered 2016-03-07: 1.2 mg via ORAL
  Filled 2016-03-07: qty 2

## 2016-03-07 MED ORDER — INSULIN ASPART 100 UNIT/ML ~~LOC~~ SOLN
0.0000 [IU] | Freq: Three times a day (TID) | SUBCUTANEOUS | Status: DC
  Administered 2016-03-07: 8 [IU] via SUBCUTANEOUS
  Administered 2016-03-07: 11 [IU] via SUBCUTANEOUS
  Administered 2016-03-08: 3 [IU] via SUBCUTANEOUS
  Administered 2016-03-08: 4 [IU] via SUBCUTANEOUS
  Administered 2016-03-08 – 2016-03-09 (×2): 2 [IU] via SUBCUTANEOUS

## 2016-03-07 MED ORDER — SODIUM CHLORIDE 0.9% FLUSH: 3.0000 mL | INTRAVENOUS | Status: DC | PRN

## 2016-03-07 MED ORDER — COLCHICINE 0.6 MG PO TABS
1.2000 mg | ORAL_TABLET | Freq: Once | ORAL | Status: DC
Start: 2016-03-07 — End: 2016-03-07

## 2016-03-07 MED ORDER — LIDOCAINE HCL 2 % IJ SOLN
10.0000 mL | Freq: Once | INTRAMUSCULAR | Status: AC
Start: 2016-03-07 — End: 2016-03-07
  Administered 2016-03-07: 10 mL via INTRADERMAL
  Filled 2016-03-07: qty 20

## 2016-03-07 MED ORDER — ENOXAPARIN SODIUM 40 MG/0.4ML ~~LOC~~ SOLN
40.0000 mg | SUBCUTANEOUS | Status: DC
  Administered 2016-03-07 – 2016-03-09 (×3): 40 mg via SUBCUTANEOUS
  Filled 2016-03-07 (×3): qty 0.4

## 2016-03-07 MED ORDER — ALBUTEROL SULFATE HFA 108 (90 BASE) MCG/ACT IN AERS: 2.0000 | INHALATION_SPRAY | Freq: Four times a day (QID) | RESPIRATORY_TRACT | Status: DC | PRN

## 2016-03-07 MED ORDER — ENALAPRIL MALEATE 10 MG PO TABS
10.0000 mg | ORAL_TABLET | Freq: Every day | ORAL | Status: DC
  Administered 2016-03-07 – 2016-03-09 (×3): 10 mg via ORAL
  Filled 2016-03-07 (×3): qty 1

## 2016-03-07 MED ORDER — DICLOFENAC SODIUM 1 % TD GEL
4.0000 g | Freq: Two times a day (BID) | TRANSDERMAL | Status: DC | PRN
  Filled 2016-03-07: qty 100

## 2016-03-07 MED ORDER — FLUTICASONE PROPIONATE 50 MCG/ACT NA SUSP
1.0000 | Freq: Every day | NASAL | Status: DC
  Administered 2016-03-07 – 2016-03-08 (×2): 1 via NASAL
  Filled 2016-03-07 (×2): qty 16

## 2016-03-07 MED ORDER — TRAMADOL HCL 50 MG PO TABS
50.0000 mg | ORAL_TABLET | Freq: Four times a day (QID) | ORAL | Status: DC | PRN
  Administered 2016-03-08 – 2016-03-09 (×3): 50 mg via ORAL
  Filled 2016-03-07 (×3): qty 1

## 2016-03-07 MED ORDER — PRAVASTATIN SODIUM 20 MG PO TABS
40.0000 mg | ORAL_TABLET | Freq: Every day | ORAL | Status: DC
  Administered 2016-03-07 – 2016-03-09 (×3): 40 mg via ORAL
  Filled 2016-03-07 (×3): qty 2

## 2016-03-07 MED ORDER — HYDROXYZINE HCL 25 MG PO TABS: 25.0000 mg | ORAL_TABLET | Freq: Four times a day (QID) | ORAL | Status: DC | PRN

## 2016-03-07 MED ORDER — MOMETASONE FURO-FORMOTEROL FUM 200-5 MCG/ACT IN AERO
2.0000 | INHALATION_SPRAY | Freq: Two times a day (BID) | RESPIRATORY_TRACT | Status: DC
  Administered 2016-03-07 – 2016-03-09 (×4): 2 via RESPIRATORY_TRACT
  Filled 2016-03-07: qty 8.8

## 2016-03-07 MED ORDER — SODIUM CHLORIDE 0.9 % IV SOLN: 250.0000 mL | INTRAVENOUS | Status: DC | PRN

## 2016-03-07 MED ORDER — INSULIN ASPART 100 UNIT/ML ~~LOC~~ SOLN: 0.0000 [IU] | Freq: Every day | SUBCUTANEOUS | Status: DC

## 2016-03-07 MED ORDER — IPRATROPIUM BROMIDE HFA 17 MCG/ACT IN AERS: 2.0000 | INHALATION_SPRAY | RESPIRATORY_TRACT | Status: DC | PRN

## 2016-03-07 MED ORDER — HYDROMORPHONE HCL 1 MG/ML IJ SOLN: 0.5000 mg | INTRAMUSCULAR | Status: AC | PRN

## 2016-03-07 MED ORDER — COLCHICINE 0.6 MG PO TABS
0.6000 mg | ORAL_TABLET | Freq: Every day | ORAL | Status: DC
  Administered 2016-03-08 – 2016-03-09 (×2): 0.6 mg via ORAL
  Filled 2016-03-07 (×2): qty 1

## 2016-03-07 MED ORDER — PAROXETINE HCL 20 MG PO TABS
10.0000 mg | ORAL_TABLET | Freq: Every day | ORAL | Status: DC
  Administered 2016-03-07 – 2016-03-09 (×3): 10 mg via ORAL
  Filled 2016-03-07 (×3): qty 1

## 2016-03-07 NOTE — Evaluation (Signed)
Physical Therapy Evaluation Patient Details Name: Melissa Chavez MRN: [id number] DOB: 09/19/1960 Today's Date: 03/07/2016   History of Present Illness  56 yo female admitted with gout. Hx of OA, DM, HTN, bipolar d/o, asthma, osteoporosis, gout.   Clinical Impression  On eval, pt was Min guard assist for bed mobility. Pt was unable to stand-2 attempts- from bed with RW. Mobility is currently limited by pain. Discussed d/c plan-pt is not agreeable to a SNF. She also declined HHPT follow up during session. Will continue to follow and progress activity as tolerated.     Follow Up Recommendations SNF;Supervision/Assistance - 24 hour (pt will likely refuse placement. Pt declined HHPT follow up during PT session. HHPT could be beneficial if she is agreeable)    Equipment Recommendations  Rolling walker with 5" wheels;3in1 (PT)    Recommendations for Other Services       Precautions / Restrictions Precautions Precautions: Fall Restrictions Weight Bearing Restrictions: No      Mobility  Bed Mobility Overal bed mobility: Needs Assistance Bed Mobility: Supine to Sit;Sit to Supine     Supine to sit: Min guard;HOB elevated Sit to supine: HOB elevated;Min guard   General bed mobility comments: Pt used socks to aid bil LEs off/onto bed. Increased time.   Transfers Overall transfer level: Needs assistance Equipment used: Rolling walker (2 wheeled) Transfers: Sit to/from Stand;Lateral/Scoot Transfers Sit to Stand: From elevated surface;Mod assist        Lateral/Scoot Transfers: Min guard General transfer comment: Attempted sit to stanad x 2 with RW. Pt unable to stand due to pain. Pt was able to perform [facility]  Ambulation/Gait                Stairs            Wheelchair Mobility    Modified Rankin (Stroke Patients Only)       Balance                                             Pertinent Vitals/Pain Pain Assessment:  Faces Faces Pain Scale: Hurts whole lot Pain Location: bil feet, L knee Pain Descriptors / Indicators: Sore;Guarding Pain Intervention(s): Limited activity within patient's tolerance;Repositioned    Home Living Family/patient expects to be discharged to:: Private residence Living Arrangements: Children Available Help at Discharge: Family Type of Home: Apartment Home Access: Stairs to enter   Secretary/administratorntrance Stairs-Number of Steps: 4 Home Layout: One level Home Equipment: None      Prior Function Level of Independence: Independent               Hand Dominance        Extremity/Trunk Assessment   Upper Extremity Assessment Upper Extremity Assessment: Overall WFL for tasks assessed    Lower Extremity Assessment Lower Extremity Assessment: Generalized weakness (swelling, redness bil feet. Dressing around L knee-pt states MD aspirated her knee)    Cervical / Trunk Assessment Cervical / Trunk Assessment: Normal  Communication   Communication: No difficulties  Cognition Arousal/Alertness: Awake/alert Behavior During Therapy: WFL for tasks assessed/performed Overall Cognitive Status: Within Functional Limits for tasks assessed                      General Comments      Exercises     Assessment/Plan    PT Assessment Patient needs continued  PT services  PT Problem List Decreased strength;Decreased mobility;Decreased range of motion;Decreased activity tolerance;Decreased balance;Pain;Decreased knowledge of use of DME          PT Treatment Interventions DME instruction;Therapeutic activities;Therapeutic exercise;Gait training;Patient/family education;Functional mobility training;Balance training    PT Goals (Current goals can be found in the Care Plan section)  Acute Rehab PT Goals Patient Stated Goal: less pain. home.  PT Goal Formulation: With patient Time For Goal Achievement: 03/21/16 Potential to Achieve Goals: Good    Frequency Min 3X/week    Barriers to discharge        Co-evaluation               End of Session   Activity Tolerance: Patient limited by pain Patient left: in bed;with call bell/phone within reach;with bed alarm set      Functional Assessment Tool Used: clinical judgement Functional Limitation: Mobility: Walking and moving around Mobility: Walking and Moving Around Current Status (Z6109): At least 40 percent but less than 60 percent impaired, limited or restricted Mobility: Walking and Moving Around Goal Status [phone]): At least 1 percent but less than 20 percent impaired, limited or restricted    Time: 1020-1049 PT Time Calculation (min) (ACUTE ONLY): 29 min   Charges:   PT Evaluation $PT Eval Low Complexity: 1 Procedure PT Treatments $Therapeutic Activity: 8-22 mins   PT G Codes:   PT G-Codes **NOT FOR INPATIENT CLASS** Functional Assessment Tool Used: clinical judgement Functional Limitation: Mobility: Walking and moving around Mobility: Walking and Moving Around Current Status (U9811): At least 40 percent but less than 60 percent impaired, limited or restricted Mobility: Walking and Moving Around Goal Status [phone]): At least 1 percent but less than 20 percent impaired, limited or restricted    Rebeca Alert, MPT Pager: [phone]

## 2016-03-07 NOTE — H&P (Signed)
[facility] History and Physical  [name]:[id number] DOB: [birth year] DOA: 03/06/2016  PCP: Tawanna Solo, MD  Patient coming from: home  Chief Complaint: Knee pain  HPI: Melissa Chavez is a 56 y.o. female with a medical history of asthma, diabetes, hypertension, presented to the emergency department with complaints of knee pain and swelling. Patient with presumed history of gout was" seen in the past however this was discontinued. Patient started to have constant moderate knee pain over the last several days. She was given tramadol for elbow previously, which she believes may have attributed to her gout. She did state that the tramadol did help her left elbow pain. Patient states she has been unable to walk or bear weight on her legs and ankles due to pain and swelling. Nothing seems to make this better or worse. She currently denies any chest pain, shortness of breath, abdominal pain, nausea or vomiting, diarrhea constipation, dizziness or headache, recent illness or travel.  ED Course: Joint aspriation of the left knee. Started on colchicine and given solumedrol. TRH called for admission.  Review of Systems:  All other systems reviewed and are negative.   Past Medical History:  Diagnosis Date  . Allergy   . Anemia   . Asthma   . Bipolar 2 disorder ([place])   . Diabetes mellitus without complication ([place])   . GERD (gastroesophageal reflux disease)   . Glaucoma    Pt denies  . Hernia   . Hypertension     Past Surgical History:  Procedure Laterality Date  . HERNIA REPAIR    . TONSILLECTOMY     Pt denies  . TUBAL LIGATION      Social History:  reports that she has never smoked. She has never used smokeless tobacco. She reports that she drinks alcohol. She reports that she does not use drugs.   Allergies  Allergen Reactions  . Fish Allergy Anaphylaxis  . Banana Other (See Comments)    unknown  . Other     Cockroaches, rag weed, pollen,- showed up in  allergy test unknown reaction  . Prednisone Other (See Comments)    Anxiety, erratic behavior, ?hallucinations Requiring ED and psychiatric evaluation    Family History  Problem Relation Age of Onset  . Hypertension Mother   . Diabetes Mother   . Hyperlipidemia Mother   . Stroke Mother   . Heart disease Mother   . Pulmonary embolism Mother   . Hypertension Father   . Diabetes Father   . Hyperlipidemia Father     Prior to Admission medications   Medication Sig Start Date End Date Taking? Authorizing Provider  ACCU-CHEK SOFTCLIX LANCETS lancets Test blood sugar once daily. Dx: E11.42 10/21/15  Yes Wardell Honour, MD  ADVAIR DISKUS 250-50 MCG/DOSE AEPB Inhale 1 puff by mouth into the lungs two times daily 07/01/15  Yes Shawnee Knapp, MD  Blood Glucose Calibration (ACCU-CHEK AVIVA) SOLN Test blood sugar once daily. Dx: E11.42 10/21/15  Yes Wardell Honour, MD  blood glucose meter kit and supplies KIT Use as directed 4 times a day.  DX: R73.09 06/13/14  Yes Shawnee Knapp, MD  Blood Glucose Monitoring Suppl (ACCU-CHEK AVIVA PLUS) w/Device KIT Test blood sugar once daily. Dx: E11.42 10/21/15  Yes Wardell Honour, MD  cetirizine (ZYRTEC ALLERGY) 10 MG tablet Take 1 tablet (10 mg total) by mouth daily. 11/29/15  Yes Montine Circle, PA-C  diclofenac sodium (VOLTAREN) 1 % GEL Apply 4 g topically 2 (  two) times daily. Patient taking differently: Apply 4 g topically 2 (two) times daily as needed (pain).  06/30/15  Yes Darlyne Russian, MD  enalapril (VASOTEC) 10 MG tablet Take 10 mg by mouth daily. 02/11/16  Yes Historical Provider, MD  fluticasone (FLONASE) 50 MCG/ACT nasal spray PLACE 2 SPRAYS INTO BOTH NOSTRILS AT BEDTIME. Patient taking differently: Place 1 spray into both nostrils daily.  07/28/15  Yes Shawnee Knapp, MD  glucose blood (ACCU-CHEK AVIVA PLUS) test strip Test blood sugar once daily. Dx: E11.42 10/21/15  Yes Wardell Honour, MD  hydrOXYzine (ATARAX/VISTARIL) 25 MG tablet Take 25 mg by mouth every 6 (six)  hours as needed for anxiety or itching.  08/24/15  Yes Historical Provider, MD  ipratropium (ATROVENT HFA) 17 MCG/ACT inhaler Inhale 2 puffs into the lungs every 4 (four) hours as needed for wheezing. 08/05/15  Yes April Palumbo, MD  metFORMIN (GLUCOPHAGE-XR) 500 MG 24 hr tablet Take 1,000 mg by mouth at bedtime. 02/11/16  Yes Historical Provider, MD  oxybutynin (DITROPAN XL) 15 MG 24 hr tablet TAKE 1 TABLET BY MOUTH AT  BEDTIME Patient taking differently: TAKE 15 MG BY MOUTH AT  BEDTIME 03/01/16  Yes Chelle Jeffery, PA-C  PARoxetine (PAXIL) 10 MG tablet Take 10 mg by mouth daily. 10/20/15  Yes Historical Provider, MD  pravastatin (PRAVACHOL) 40 MG tablet TAKE 1 TABLET BY MOUTH  DAILY Patient taking differently: TAKE 40 MG BY MOUTH  DAILY 03/01/16  Yes Chelle Jeffery, PA-C  traMADol (ULTRAM) 50 MG tablet Take 50 mg by mouth every 6 (six) hours as needed for moderate pain or severe pain.  03/04/16  Yes Historical Provider, MD  triamterene-hydrochlorothiazide (MAXZIDE-25) 37.5-25 MG tablet Take 1 tablet by mouth daily.  07/20/15  Yes Historical Provider, MD  VENTOLIN HFA 108 (90 Base) MCG/ACT inhaler Inhale 2 puffs by mouth  every 6 hours as needed for wheezing Patient taking differently: Inhale 2 puffs into the lungs every 6 (six) hours as needed for wheezing or shortness of breath.  01/15/16  Yes Harrison Mons, PA-C    Physical Exam: Vitals:   03/07/16 0230 03/07/16 0630  BP: 136/66 137/82  Pulse: 112 99  Resp: 15 20  Temp:  98.1 F (36.7 C)     General: Well developed, well nourished, NAD, appears stated age  HEENT: NCAT, PERRLA, EOMI, Anicteic Sclera, mucous membranes moist.   Neck: Supple, no JVD, no masses  Cardiovascular: S1 S2 auscultated, no rubs, murmurs or gallops. Tachycardic   Respiratory: Clear to auscultation bilaterally with equal chest rise  Abdomen: Soft, obese, nontender, nondistended, + bowel sounds  Extremities: warm dry without cyanosis clubbing. Trace LE edema.  Left knee wrapped.  TTP of the right knee and B/L ankles.   Neuro: AAOx3, cranial nerves grossly intact. Strength decreased secondary to pain in the LE.   Skin: Without rashes exudates or nodules  Psych: Normal affect and demeanor with intact judgement and insight  Labs on Admission: I have personally reviewed following labs and imaging studies CBC:  Recent Labs Lab 03/06/16 2210  WBC 20.3*  NEUTROABS 17.0*  HGB 11.0*  HCT 33.2*  MCV 78.7  PLT 364*   Basic Metabolic Panel:  Recent Labs Lab 03/06/16 2210  NA 132*  K 3.9  CL 95*  CO2 27  GLUCOSE 205*  BUN 14  CREATININE 0.98  CALCIUM 8.9   GFR: Estimated Creatinine Clearance: 70.8 mL/min (by C-G formula based on SCr of 0.98 mg/dL). Liver Function Tests: No results  for input(s): AST, ALT, ALKPHOS, BILITOT, PROT, ALBUMIN in the last 168 hours. No results for input(s): LIPASE, AMYLASE in the last 168 hours. No results for input(s): AMMONIA in the last 168 hours. Coagulation Profile: No results for input(s): INR, PROTIME in the last 168 hours. Cardiac Enzymes: No results for input(s): CKTOTAL, CKMB, CKMBINDEX, TROPONINI in the last 168 hours. BNP (last 3 results) No results for input(s): PROBNP in the last 8760 hours. HbA1C: No results for input(s): HGBA1C in the last 72 hours. CBG: No results for input(s): GLUCAP in the last 168 hours. Lipid Profile: No results for input(s): CHOL, HDL, LDLCALC, TRIG, CHOLHDL, LDLDIRECT in the last 72 hours. Thyroid Function Tests: No results for input(s): TSH, T4TOTAL, FREET4, T3FREE, THYROIDAB in the last 72 hours. Anemia Panel: No results for input(s): VITAMINB12, FOLATE, FERRITIN, TIBC, IRON, RETICCTPCT in the last 72 hours. Urine analysis:    Component Value Date/Time   COLORURINE YELLOW 10/14/2015 0200   APPEARANCEUR CLEAR 10/14/2015 0200   LABSPEC 1.016 10/14/2015 0200   PHURINE 5.5 10/14/2015 0200   GLUCOSEU NEGATIVE 10/14/2015 0200   HGBUR NEGATIVE 10/14/2015 0200     HGBUR negative 03/18/2008 0835   BILIRUBINUR NEGATIVE 10/14/2015 0200   BILIRUBINUR negative 08/31/2015 0941   BILIRUBINUR neg 06/13/2014 1001   KETONESUR NEGATIVE 10/14/2015 0200   PROTEINUR NEGATIVE 10/14/2015 0200   UROBILINOGEN 0.2 08/31/2015 0941   UROBILINOGEN 0.2 03/18/2008 0835   NITRITE NEGATIVE 10/14/2015 0200   LEUKOCYTESUR NEGATIVE 10/14/2015 0200   Sepsis Labs: '@LABRCNTIP' (procalcitonin:4,lacticidven:4) ) Recent Results (from the past 240 hour(s))  Body fluid culture     Status: None (Preliminary result)   Collection Time: 03/07/16  1:29 AM  Result Value Ref Range Status   Specimen Description SYNOVIAL KNEE  Final   Special Requests NONE  Final   Gram Stain   Final    NO ORGANISMS SEEN ABUNDANT WBC PRESENT, PREDOMINANTLY PMN Results Called to: E COGGIN RN @ 0200 ON 03/07/16 BY C DAVIS    Culture PENDING  Incomplete   Report Status PENDING  Incomplete     Radiological Exams on Admission: Dg Knee Complete 4 Views Left  Result Date: 03/06/2016 CLINICAL DATA:  Constant bilateral knee pain for 3 days, no recent trauma. EXAM: LEFT KNEE - COMPLETE 4+ VIEW COMPARISON:  None. FINDINGS: Bipartite patella. Negative acute fracture.  No bone lesion or bony destruction. Moderate osteoarthritic changes at the medial and patellofemoral compartments. Small joint effusion. IMPRESSION: Osteoarthritis.  Small joint effusion.  No acute bony abnormality. Electronically Signed   By: Andreas Newport M.D.   On: 03/06/2016 22:48   Dg Knee Complete 4 Views Right  Result Date: 03/06/2016 CLINICAL DATA:  Nontraumatic moderate knee pain for 3 days. EXAM: RIGHT KNEE - COMPLETE 4+ VIEW COMPARISON:  None. FINDINGS: Negative for acute fracture or dislocation. There is no bone lesion or bony destruction. There is soft tissue calcification in the musculature of the distal thigh, likely due to prior trauma or inflammation. There is moderately severe osteoarthritis in the medial and patellofemoral  compartments. IMPRESSION: Osteoarthritis.  No acute bony abnormality. Electronically Signed   By: Andreas Newport M.D.   On: 03/06/2016 22:49    EKG: None  Assessment/Plan  Acute Gouty attack/Left knee -Left knee x-ray showed osteoporosis small joint effusion -Right knee x-ray showed osteoarthritis -s/p joint aspiration/arthrocentesis -Labs that show urate crystals -Patient used to be on colchicine however this was discontinued as she was told she did not have gout in the past. -Patient  does try to watch her diet. -Patient was given IV steroids in the emergency department, does state she has a prednisone allergy. Also given colchine. -Patient is on HCTZ, currently held. -Continue colchicine and pain control -Blood and fluid cultures pending -PT/OT consulted -Patient may benefit from a rheumatology follow-up as an outpatient  Leukocytosis -Reactive, continue to monitor CBC -No complaints of shortness of breath, cough, urinary symptoms. Currently afebrile  Essential hypertension -Continue enalapril  Diabetes mellitus, type II -Hold metformin -Placed on sliding-scale CBG monitoring  Asthma -Controlled, continue Atrovent, Advair diskus, albuterol  Allergies -Continue loratadine, nasal spray  Depression -Continue Paxil  Hyperlipidemia -Continue statin  DVT prophylaxis: Lovenox  Code Status: Full  Family Communication: Daughter at bedside. Admission, patients condition and plan of care including tests being ordered have been discussed with the patient and daughter who indicate understanding and agree with the plan and Code Status.  Disposition Plan: Home   Consults called: None   Admission status: Observation   Time spent: 60 minutes  Cobey Raineri D.O. [facility] Pager [phone]  If 7PM-7AM, please contact night-coverage [email or web address] Password [email or web address] 03/07/2016, 8:24 AM

## 2016-03-07 NOTE — Progress Notes (Signed)
Assumed care of patient at 1600. Bedside report completed. Pt verbalized understanding of how to use call light. Bed in lowest and locked position. Pt confirmed that she should not move out of bed unless RN or PCT is at bedside. Personal items and phones within reach. Will continue to monitor patient with hourly rounding

## 2016-03-08 DIAGNOSIS — I10 Essential (primary) hypertension: Secondary | ICD-10-CM | POA: Diagnosis not present

## 2016-03-08 DIAGNOSIS — E1142 Type 2 diabetes mellitus with diabetic polyneuropathy: Secondary | ICD-10-CM

## 2016-03-08 DIAGNOSIS — M109 Gout, unspecified: Secondary | ICD-10-CM | POA: Diagnosis not present

## 2016-03-08 DIAGNOSIS — L8990 Pressure ulcer of unspecified site, unspecified stage: Secondary | ICD-10-CM | POA: Diagnosis present

## 2016-03-08 DIAGNOSIS — L89312 Pressure ulcer of right buttock, stage 2: Secondary | ICD-10-CM

## 2016-03-08 LAB — CBC
HCT: 29.5 % — ABNORMAL LOW (ref 36.0–46.0)
Hemoglobin: 10.1 g/dL — ABNORMAL LOW (ref 12.0–15.0)
MCH: 26.9 pg (ref 26.0–34.0)
MCHC: 34.2 g/dL (ref 30.0–36.0)
MCV: 78.5 fL (ref 78.0–100.0)
Platelets: 484 10*3/uL — ABNORMAL HIGH (ref 150–400)
RBC: 3.76 MIL/uL — ABNORMAL LOW (ref 3.87–5.11)
RDW: 16.1 % — ABNORMAL HIGH (ref 11.5–15.5)
WBC: 20 10*3/uL — ABNORMAL HIGH (ref 4.0–10.5)

## 2016-03-08 LAB — GLUCOSE, CAPILLARY
Glucose-Capillary: 247 mg/dL — ABNORMAL HIGH (ref 65–99)
Glucose-Capillary: 132 mg/dL — ABNORMAL HIGH (ref 65–99)
Glucose-Capillary: 188 mg/dL — ABNORMAL HIGH (ref 65–99)
Glucose-Capillary: 238 mg/dL — ABNORMAL HIGH (ref 65–99)

## 2016-03-08 LAB — BASIC METABOLIC PANEL
Anion gap: 9 (ref 5–15)
BUN: 21 mg/dL — ABNORMAL HIGH (ref 6–20)
CO2: 30 mmol/L (ref 22–32)
Calcium: 8.9 mg/dL (ref 8.9–10.3)
Chloride: 100 mmol/L — ABNORMAL LOW (ref 101–111)
Creatinine, Ser: 0.85 mg/dL (ref 0.44–1.00)
GFR calc Af Amer: >60 mL/min (ref >60)
GFR calc non Af Amer: >60 mL/min (ref >60)
Glucose, Bld: 262 mg/dL — ABNORMAL HIGH (ref 65–99)
Potassium: 4.1 mmol/L (ref 3.5–5.1)
Sodium: 139 mmol/L (ref 135–145)

## 2016-03-08 MED ORDER — METHYLPREDNISOLONE 8 MG PO TABS: ORAL_TABLET | ORAL | 0 refills | Status: DC

## 2016-03-08 MED ORDER — COLCHICINE 0.6 MG PO TABS: 0.6000 mg | ORAL_TABLET | Freq: Every day | ORAL | 0 refills | Status: DC

## 2016-03-08 MED ORDER — ZINC OXIDE 11.3 % EX CREA: 1.0000 "application " | TOPICAL_CREAM | Freq: Two times a day (BID) | CUTANEOUS | 0 refills | Status: DC

## 2016-03-08 MED ORDER — ZINC OXIDE 11.3 % EX CREA
TOPICAL_CREAM | Freq: Two times a day (BID) | CUTANEOUS | Status: DC
  Administered 2016-03-08: 17:00:00 via TOPICAL

## 2016-03-08 MED ORDER — ZINC OXIDE 11.3 % EX CREA
TOPICAL_CREAM | Freq: Two times a day (BID) | CUTANEOUS | Status: DC
  Filled 2016-03-08: qty 56

## 2016-03-08 NOTE — Discharge Summary (Signed)
Discharge Summary  Melissa Chavez ENI:[id number] DOB: November 25, 1960  PCP: Tawanna Solo, MD  Admit date: 03/06/2016 Discharge date: 03/08/2016  Time spent: 25 minutes  Recommendations for Outpatient Follow-up:  1. New medication: Methylprednisolone taper over the next 12 days 2. New medication: Zinc oxide cream applied to right buttock twice a day 3. New medication: Colchicine 0.6 mg by mouth daily until gout attack resolved.   Discharge Diagnoses:  Active Hospital Problems   Diagnosis Date Noted  . Acute gout of left knee 03/07/2016  . Pressure injury of skin 03/08/2016  . Type 2 diabetes mellitus with diabetic polyneuropathy, without long-term current use of insulin ([place]) 03/20/2015  . Overactive bladder 06/13/2013  . Asthma, moderate persistent 06/13/2013  . Essential hypertension 12/07/2006  . Osteoarthritis of right knee 12/07/2006    Resolved Hospital Problems   Diagnosis Date Noted Date Resolved  No resolved problems to display.    Discharge Condition: Improved, being discharged home   Diet recommendation: Low-sodium   Vitals:   03/08/16 0533 03/08/16 1342  BP: (!) 141/86 140/80  Pulse: 81 70  Resp: 18 18  Temp: 98 F (36.7 C) 98 F (36.7 C)    History of present illness:  56 year old female with past medical history of type 2 diabetes mellitus with diabetic polyneuropathy and osteoarthritis of right knee plus hypertension presented to the emergency room on 1/1 with left knee pain and swelling they have been going on for several days. The emergency room, x-rays revealed signs of knee effusion and patient underwent tap which revealed more than 47,000 white blood cells. Further testing revealed crystals consistent with gout. Patient given dose of steroids in the emergency room plus colchicine started. Because of difficulty walking, hospitalist will call for further evaluation.  Hospital Course:  Principal Problem:   Acute gout of left knee: Patient knee pain  improved by following day. Evaluated by physical therapy who recommended skilled nursing and when she declined this they recommended home health which she also declined. When I tried to discuss this with her, she given detailed explanation about staying in town trying to get her mother out of her current apartment, but she continued to decline physical therapy. She'll be discharged home on by mouth colchicine and steroids with plans for eventual allopurinol which can be started by her PCP Active Problems:   Essential hypertension: Stable continue home medications   Osteoarthritis of right knee: Stable, plans for total knee replacement according to patient   Overactive bladder: Stable continue home medications   Asthma, moderate persistent: Stable during hospitalization   Type 2 diabetes mellitus with diabetic polyneuropathy, without long-term current use of insulin ([place]): Stable during hospitalization   Pressure injury of skin: Nursing noted on admission that patient had significant skin damage of the right buttock, specifically incontinence associated dermatitis with partial-thickness tissue loss from prolonged immobilization due to knee pain. When care saw patient and recommended zinc oxide cream applied twice a day and with increased mobility, should resolve.   Procedures:  Aspiration of left knee joint in emergency room 1/1   Consultations: NoneDischarge Exam: BP 140/80 (BP Location: Left Arm)   Pulse 70   Temp 98 F (36.7 C) (Oral)   Resp 18   Ht '5\' 2"'  (1.575 m)   Wt 99.8 kg (220 lb)   LMP 05/30/2013   SpO2 100%   BMI 40.24 kg/m   General: alert and oriented 3, no acute distress  Cardiovascular: regular rate and rhythm, S1-S2  Respiratory: clear to  auscultation bilaterally   Discharge Instructions You were cared for by a hospitalist during your hospital stay. If you have any questions about your discharge medications or the care you received while you were in the hospital after  you are discharged, you can call the unit and asked to speak with the hospitalist on call if the hospitalist that took care of you is not available. Once you are discharged, your primary care physician will handle any further medical issues. Please note that NO REFILLS for any discharge medications will be authorized once you are discharged, as it is imperative that you return to your primary care physician (or establish a relationship with a primary care physician if you do not have one) for your aftercare needs so that they can reassess your need for medications and monitor your lab values.   Allergies as of 03/08/2016      Reactions   Fish Allergy Anaphylaxis   Banana Other (See Comments)   unknown   Other    Cockroaches, rag weed, pollen,- showed up in allergy test unknown reaction   Prednisone Other (See Comments)   Anxiety, erratic behavior, ?hallucinations Requiring ED and psychiatric evaluation      Medication List    TAKE these medications   ACCU-CHEK AVIVA PLUS w/Device Kit Test blood sugar once daily. Dx: E11.42   ACCU-CHEK AVIVA Soln Test blood sugar once daily. Dx: E11.42   ACCU-CHEK SOFTCLIX LANCETS lancets Test blood sugar once daily. Dx: E11.42   ADVAIR DISKUS 250-50 MCG/DOSE Aepb Generic drug:  Fluticasone-Salmeterol Inhale 1 puff by mouth into the lungs two times daily   blood glucose meter kit and supplies Kit Use as directed 4 times a day.  DX: R73.09   cetirizine 10 MG tablet Commonly known as:  ZYRTEC ALLERGY Take 1 tablet (10 mg total) by mouth daily.   colchicine 0.6 MG tablet Take 1 tablet (0.6 mg total) by mouth daily. Start taking on:  03/09/2016   diclofenac sodium 1 % Gel Commonly known as:  VOLTAREN Apply 4 g topically 2 (two) times daily. What changed:  when to take this  reasons to take this   enalapril 10 MG tablet Commonly known as:  VASOTEC Take 10 mg by mouth daily.   fluticasone 50 MCG/ACT nasal spray Commonly known as:   FLONASE PLACE 2 SPRAYS INTO BOTH NOSTRILS AT BEDTIME. What changed:  how much to take  how to take this  when to take this  additional instructions   glucose blood test strip Commonly known as:  ACCU-CHEK AVIVA PLUS Test blood sugar once daily. Dx: E11.42   hydrOXYzine 25 MG tablet Commonly known as:  ATARAX/VISTARIL Take 25 mg by mouth every 6 (six) hours as needed for anxiety or itching.   ipratropium 17 MCG/ACT inhaler Commonly known as:  ATROVENT HFA Inhale 2 puffs into the lungs every 4 (four) hours as needed for wheezing.   metFORMIN 500 MG 24 hr tablet Commonly known as:  GLUCOPHAGE-XR Take 1,000 mg by mouth at bedtime.   methylPREDNISolone 8 MG tablet Commonly known as:  MEDROL Take 4 pills for 3 days, then 3 pills for 3 days, then 2 pills for 3 days, then 1 pill for 3 days   oxybutynin 15 MG 24 hr tablet Commonly known as:  DITROPAN XL TAKE 1 TABLET BY MOUTH AT  BEDTIME What changed:  See the new instructions.   PARoxetine 10 MG tablet Commonly known as:  PAXIL Take 10 mg by mouth daily.  pravastatin 40 MG tablet Commonly known as:  PRAVACHOL TAKE 1 TABLET BY MOUTH  DAILY What changed:  See the new instructions.   traMADol 50 MG tablet Commonly known as:  ULTRAM Take 50 mg by mouth every 6 (six) hours as needed for moderate pain or severe pain.   triamterene-hydrochlorothiazide 37.5-25 MG tablet Commonly known as:  MAXZIDE-25 Take 1 tablet by mouth daily.   VENTOLIN HFA 108 (90 Base) MCG/ACT inhaler Generic drug:  albuterol Inhale 2 puffs by mouth  every 6 hours as needed for wheezing What changed:  how much to take  how to take this  when to take this  reasons to take this  additional instructions   zinc oxide 11.3 % Crea cream Commonly known as:  BALMEX Apply 1 application topically 2 (two) times daily. Apply to Right Buttock      Allergies  Allergen Reactions  . Fish Allergy Anaphylaxis  . Banana Other (See Comments)     unknown  . Other     Cockroaches, rag weed, pollen,- showed up in allergy test unknown reaction  . Prednisone Other (See Comments)    Anxiety, erratic behavior, ?hallucinations Requiring ED and psychiatric evaluation      The results of significant diagnostics from this hospitalization (including imaging, microbiology, ancillary and laboratory) are listed below for reference.    Significant Diagnostic Studies: Dg Knee Complete 4 Views Left  Result Date: 03/06/2016 CLINICAL DATA:  Constant bilateral knee pain for 3 days, no recent trauma. EXAM: LEFT KNEE - COMPLETE 4+ VIEW COMPARISON:  None. FINDINGS: Bipartite patella. Negative acute fracture.  No bone lesion or bony destruction. Moderate osteoarthritic changes at the medial and patellofemoral compartments. Small joint effusion. IMPRESSION: Osteoarthritis.  Small joint effusion.  No acute bony abnormality. Electronically Signed   By: Andreas Newport M.D.   On: 03/06/2016 22:48   Dg Knee Complete 4 Views Right  Result Date: 03/06/2016 CLINICAL DATA:  Nontraumatic moderate knee pain for 3 days. EXAM: RIGHT KNEE - COMPLETE 4+ VIEW COMPARISON:  None. FINDINGS: Negative for acute fracture or dislocation. There is no bone lesion or bony destruction. There is soft tissue calcification in the musculature of the distal thigh, likely due to prior trauma or inflammation. There is moderately severe osteoarthritis in the medial and patellofemoral compartments. IMPRESSION: Osteoarthritis.  No acute bony abnormality. Electronically Signed   By: Andreas Newport M.D.   On: 03/06/2016 22:49    Microbiology: Recent Results (from the past 240 hour(s))  Blood culture (routine x 2)     Status: None (Preliminary result)   Collection Time: 03/07/16  1:25 AM  Result Value Ref Range Status   Specimen Description BLOOD RIGHT ANTECUBITAL  Final   Special Requests BOTTLES DRAWN AEROBIC AND ANAEROBIC 6ML EA  Final   Culture   Final    NO GROWTH 1  DAY Performed at [facility]    Report Status PENDING  Incomplete  Blood culture (routine x 2)     Status: None (Preliminary result)   Collection Time: 03/07/16  1:29 AM  Result Value Ref Range Status   Specimen Description BLOOD LEFT ANTECUBITAL  Final   Special Requests BOTTLES DRAWN AEROBIC AND ANAEROBIC 10 CC EA  Final   Culture   Final    NO GROWTH 1 DAY Performed at [facility]    Report Status PENDING  Incomplete  Body fluid culture     Status: None (Preliminary result)   Collection Time: 03/07/16  1:29  AM  Result Value Ref Range Status   Specimen Description SYNOVIAL KNEE  Final   Special Requests NONE  Final   Gram Stain   Final    NO ORGANISMS SEEN ABUNDANT WBC PRESENT, PREDOMINANTLY PMN Results Called to: E COGGIN RN @ 0200 ON 03/07/16 BY C DAVIS    Culture   Final    NO GROWTH 1 DAY Performed at [facility]    Report Status PENDING  Incomplete     Labs: Basic Metabolic Panel:  Recent Labs Lab 03/06/16 2210 03/07/16 0827 03/08/16 0403  NA 132*  --  139  K 3.9  --  4.1  CL 95*  --  100*  CO2 27  --  30  GLUCOSE 205*  --  262*  BUN 14  --  21*  CREATININE 0.98 0.92 0.85  CALCIUM 8.9  --  8.9   Liver Function Tests: No results for input(s): AST, ALT, ALKPHOS, BILITOT, PROT, ALBUMIN in the last 168 hours. No results for input(s): LIPASE, AMYLASE in the last 168 hours. No results for input(s): AMMONIA in the last 168 hours. CBC:  Recent Labs Lab 03/06/16 2210 03/07/16 0827 03/08/16 0403  WBC 20.3* 15.7* 20.0*  NEUTROABS 17.0*  --   --   HGB 11.0* 10.9* 10.1*  HCT 33.2* 32.0* 29.5*  MCV 78.7 78.0 78.5  PLT 485* 480* 484*   Cardiac Enzymes: No results for input(s): CKTOTAL, CKMB, CKMBINDEX, TROPONINI in the last 168 hours. BNP: BNP (last 3 results) No results for input(s): BNP in the last 8760 hours.  ProBNP (last 3 results) No results for input(s): PROBNP in the last 8760 hours.  CBG:  Recent Labs Lab  03/07/16 1219 03/07/16 1651 03/07/16 2122 03/08/16 0805 03/08/16 1207  GLUCAP 315* 271* 164* 247* 132*       Signed:  Annita Brod, MD [facility] 03/08/2016, 4:41 PM

## 2016-03-08 NOTE — Consult Note (Signed)
   [facility]   03/08/2016  Melissa Chavez 10/09/1960 [id number]     [facility]. Spoke with Melissa Chavez at bedside to make aware that [facility] her [facility] and will follow up post hospital discharge. Noted Melissa Chavez has declined SNF placement. During bedside encounter she expresses distrust of writer coming to visit and expressed displeasure when asked if she wanted ongoing follow [facility] with [facility]. Melissa Chavez also states that she does not want anyone to think she does not want to participate with [facility] as she is often busy with her daughters and mother when she is called. States she is agreeable to [facility] her post discharge however. Bedside conversation with patient was somewhat scattered. Spoke with inpatient RNCM about the above. Will make [facility] aware as well.    Raiford NobleAtika Deisy Ozbun, MSN-Ed, RN,BSN [facility] Hospital Liaison [phone]

## 2016-03-08 NOTE — Care Management Obs Status (Signed)
MEDICARE OBSERVATION STATUS NOTIFICATION   Patient Details  Name: Melissa AlphaSherri L Macneal MRN: [id number] Date of Birth: 10/22/1960   Medicare Observation Status Notification Given:  Yes    Bartholome BillCLEMENTS, Bernece Gall H, RN 03/08/2016, 2:16 PM

## 2016-03-08 NOTE — Care Management Note (Signed)
Case Management Note  Patient Details  Name: Melissa AlphaSherri L Salada MRN: [id number] Date of Birth: 10/07/1960  Subjective/Objective:  56 yo admitted with Gout                  Action/Plan: Pt is residing in a hotel at this time and plans to go back to a hotel at discharge. Pt politely declines any home health services and equipment at this time. CM will continue to follow.  Expected Discharge Date:                  Expected Discharge Plan:  Home/Self Care  In-House Referral:     Discharge planning Services  CM Consult  Post Acute Care Choice:  Home Health Choice offered to:  Patient  DME Arranged:    DME Agency:     HH Arranged:    HH Agency:     Status of Service:  In process, will continue to follow  If discussed at Long Length of Stay Meetings, dates discussed:    Additional CommentsBartholome Bill:  Lisbeth Puller H, RN 03/08/2016, 10:56 AM  [phone]

## 2016-03-08 NOTE — Consult Note (Signed)
WOC Nurse wound consult note Reason for Consult: IAD (Incontinence Associated Dermatitis) with partial thickness tissue loss from expended period of time in chair due to knee pain Wound type:Moisture Associated Skin Damage, specifically Incontinence Associated Dermatitis (IAD) Pressure Ulcer POA: /No Measurement:Affected area measures 4cm x 7cm on left buttock Wound UEA:VWUJbed:pink, moist Drainage (amount, consistency, odor) scant serous Periwound: intact, maceration is resolving with OOB status Dressing procedure/placement/frequency: I will suggest resolution will occur within 48 hours now that patient is more mobile; cleaning twice daily with warm (tepid) tap water, follow with application of zinc-based product until resolved. WOC nursing team will not follow, but will remain available to this patient, the nursing and medical teams.  Please re-consult if needed. Thanks, Ladona MowLaurie Corena Tilson, MSN, RN, GNP, Hans EdenCWOCN, CWON-AP, FAAN  Pager# [phone]

## 2016-03-08 NOTE — Evaluation (Signed)
Occupational Therapy Evaluation Patient Details Name: Melissa Chavez MRN: [id number] DOB: 09/12/1960 Today's Date: 03/08/2016    History of Present Illness 56 yo female admitted with gout. Hx of OA, DM, HTN, bipolar d/o, asthma, osteoporosis, gout.    Clinical Impression   Pt. Was was S with bed mobility and Min A with transfers. Pt. Requires cues for proper hand placement for sit to stand and stand to sit.  Pt. Will need further OT to maximize I and safety with ADLs and mobility prior to d/c home. Pt. dtrs go to school and pt. Will be alone during the day.  Follow Up Recommendations  Home health OT    Equipment Recommendations  3 in 1 bedside commode    Recommendations for Other Services       Precautions / Restrictions Precautions Precautions: Fall Restrictions Weight Bearing Restrictions: No      Mobility Bed Mobility         Supine to sit: Supervision Sit to supine: Supervision      Transfers       Sit to Stand: Min assist;From elevated surface         General transfer comment: Pt. was Min A with 5-6 steps.     Balance                                            ADL Overall ADL's : Needs assistance/impaired Eating/Feeding: Independent   Grooming: Wash/dry hands;Wash/dry face;Oral care;Set up   Upper Body Bathing: Supervision/ safety;Set up;Sitting   Lower Body Bathing: Minimal assistance;Sit to/from stand   Upper Body Dressing : Set up;Sitting   Lower Body Dressing: Minimal assistance;Sit to/from stand   Toilet Transfer: Minimal assistance           Functional mobility during ADLs: Minimal assistance (Pt. refused to sit EOB or to stand. ) General ADL Comments: Pt. performed ADLs while sitting EOB.     Vision     Perception     Praxis      Pertinent Vitals/Pain Pain Assessment: 0-10 Faces Pain Scale: Hurts even more Pain Location: R hip Pain Descriptors / Indicators: Aching Pain Intervention(s): Limited  activity within patient's tolerance     Hand Dominance Right   Extremity/Trunk Assessment Upper Extremity Assessment Upper Extremity Assessment: Overall WFL for tasks assessed           Communication Communication Communication: No difficulties   Cognition Arousal/Alertness: Awake/alert Behavior During Therapy: WFL for tasks assessed/performed Overall Cognitive Status: Within Functional Limits for tasks assessed                     General Comments       Exercises       Shoulder Instructions      Home Living Family/patient expects to be discharged to:: Private residence Living Arrangements: Children Available Help at Discharge: Family Type of Home: Apartment Home Access: Stairs to enter Secretary/administratorntrance Stairs-Number of Steps: 4   Home Layout: One level     Bathroom Shower/Tub: Chief Strategy OfficerTub/shower unit   Bathroom Toilet: Standard     Home Equipment: None   Additional Comments:  (Pt.is renting and wants to move.)      Prior Functioning/Environment Level of Independence: Independent                 OT Problem List: Decreased activity tolerance;Decreased knowledge of  use of DME or AE;Pain   OT Treatment/Interventions: Self-care/ADL training;DME and/or AE instruction;Therapeutic activities;Patient/family education    OT Goals(Current goals can be found in the care plan section) Acute Rehab OT Goals Patient Stated Goal:  (to go home) OT Goal Formulation: With patient Time For Goal Achievement: 03/22/16 ADL Goals Pt Will Perform Grooming: Independently;standing Pt Will Perform Lower Body Bathing: with modified independence;sit to/from stand Pt Will Perform Lower Body Dressing: with modified independence;sit to/from stand Pt Will Transfer to Toilet: with modified independence;ambulating Pt Will Perform Toileting - Clothing Manipulation and hygiene: with modified independence;sit to/from stand  OT Frequency: Min 2X/week   Barriers to D/C: Decreased caregiver  support  Pnt dtr go to school       Co-evaluation              End of Session Equipment Utilized During Treatment: Gait belt;Rolling walker  Activity Tolerance: Patient tolerated treatment well Patient left: in bed;with call bell/phone within reach;with bed alarm set   Time: 4098-1191 OT Time Calculation (min): 53 min Charges:  OT General Charges $OT Visit: 1 Procedure OT Evaluation $OT Eval Moderate Complexity: 1 Procedure OT Treatments $Self Care/Home Management : 23-37 mins G-Codes: OT G-codes **NOT FOR INPATIENT CLASS** Functional Assessment Tool Used:  (clinical judgement.) Functional Limitation: Self care Self Care Current Status (Y7829): At least 20 percent but less than 40 percent impaired, limited or restricted Self Care Goal Status (F6213): At least 1 percent but less than 20 percent impaired, limited or restricted  Mickeal Daws 03/08/2016, 8:44 AM

## 2016-03-08 NOTE — Progress Notes (Signed)
CSW met with pt to assist with d/c planning. PT has recommended SNF at d/c. Pt is declining SNF and plans to return home when stable. RNCM will assist with d/c planning.  Werner Lean LCSW [phone]

## 2016-03-08 NOTE — Progress Notes (Signed)
Physical Therapy Treatment Patient Details Name: Melissa AlphaSherri L Abbs MRN: [id number] DOB: 11/14/1960 Today's Date: 03/08/2016    History of Present Illness 56 yo female admitted with gout. Hx of OA, DM, HTN, bipolar d/o, asthma, osteoporosis, gout.     PT Comments    Improved activity tolerance and mobility on today. Pt walked ~10 feet with a RW (she declined hallway ambulation). Mostly Min guard (small amount of assist to stand). Discussed d/c plan-pt stated she is not going to a SNF and, per CM note, she declines HH follow up. Will continue to recommend a RW and 3n1.    Follow Up Recommendations  No PT follow up (pt refusing SNF and HH follow up)     Equipment Recommendations  Rolling walker with 5" wheels;3in1 (PT)    Recommendations for Other Services       Precautions / Restrictions Precautions Precautions: Fall Restrictions Weight Bearing Restrictions: No    Mobility  Bed Mobility   Bed Mobility: Supine to Sit;Sit to Supine     Supine to sit: Supervision Sit to supine: Supervision      Transfers Overall transfer level: Needs assistance Equipment used: Rolling walker (2 wheeled) Transfers: Sit to/from Stand Sit to Stand: Min assist         General transfer comment: small amount of assist to rise, stabilize. VCs hand placement. Pt pulled up on walker  Ambulation/Gait Ambulation/Gait assistance: Min guard Ambulation Distance (Feet): 10 Feet Assistive device: Rolling walker (2 wheeled) Gait Pattern/deviations: Antalgic;Step-to pattern;Step-through pattern     General Gait Details: close guard for safety. Pt declined hallway ambulation-only agreeable to ambulating in room   Stairs            Wheelchair Mobility    Modified Rankin (Stroke Patients Only)       Balance Overall balance assessment: Needs assistance             Standing balance comment: requring RW currently                    Cognition Arousal/Alertness:  Awake/alert Behavior During Therapy: WFL for tasks assessed/performed Overall Cognitive Status: Within Functional Limits for tasks assessed                      Exercises      General Comments        Pertinent Vitals/Pain Pain Assessment: Faces Faces Pain Scale: Hurts little more Pain Location: LEs Pain Descriptors / Indicators: Aching;Sore Pain Intervention(s): Limited activity within patient's tolerance;Repositioned    Home Living                      Prior Function            PT Goals (current goals can now be found in the care plan section) Progress towards PT goals: Progressing toward goals (slowly)    Frequency    Min 3X/week      PT Plan Current plan remains appropriate    Co-evaluation             End of Session   Activity Tolerance: Patient limited by pain Patient left: in chair;with call bell/phone within reach     Time: 1135-1153 PT Time Calculation (min) (ACUTE ONLY): 18 min  Charges:  $Gait Training: 8-22 mins                    G Codes:      Rebeca AlertJannie Jasline Buskirk, MPT  Pager: [phone]

## 2016-03-09 ENCOUNTER — Other Ambulatory Visit: Payer: Self-pay | Admitting: Family Medicine

## 2016-03-09 LAB — GLUCOSE, CAPILLARY: Glucose-Capillary: 136 mg/dL — ABNORMAL HIGH (ref 65–99)

## 2016-03-09 NOTE — Progress Notes (Signed)
Pt discharged home in stable condition. Discharge instructions given. Scripts sent to pharmacy of choice. No immediate questions or concerns at this time.  ?

## 2016-03-10 ENCOUNTER — Encounter: Payer: Self-pay | Admitting: Neurology

## 2016-03-10 ENCOUNTER — Other Ambulatory Visit: Payer: Self-pay | Admitting: *Deleted

## 2016-03-10 ENCOUNTER — Ambulatory Visit (INDEPENDENT_AMBULATORY_CARE_PROVIDER_SITE_OTHER): Payer: Medicare Other | Admitting: Neurology

## 2016-03-10 VITALS — BP 120/84 | HR 96 | Ht 62.0 in | Wt 220.0 lb

## 2016-03-10 DIAGNOSIS — R29818 Other symptoms and signs involving the nervous system: Secondary | ICD-10-CM

## 2016-03-10 DIAGNOSIS — E1142 Type 2 diabetes mellitus with diabetic polyneuropathy: Secondary | ICD-10-CM

## 2016-03-10 LAB — BODY FLUID CULTURE
Culture: NO GROWTH
Gram Stain: NONE SEEN

## 2016-03-10 NOTE — Patient Instructions (Addendum)
1.  Encouraged to be compliant with medications and follow-up with primary care doctor for refills 2.  Continue aspirin 81mg  daily

## 2016-03-10 NOTE — Progress Notes (Signed)
[facility] Clinic Note - Initial Visit   Date: 03/10/16  Melissa Chavez MRN: [id number] DOB: 1961-01-14   Dear Dr. Sabra Heck:  Thank you for your kind referral of Melissa Chavez for consultation of spell of dysarthria. Although her history is well known to you, please allow [place] to reiterate it for the purpose of our medical record. The patient was accompanied to the clinic by self.    History of Present Illness: Melissa Chavez is a 56 y.o. right-handed African American female with bipolar disorder, GERD, diabetes mellitus, asthma, gout, and hypertension presenting for evaluation of spell of dysarthria.    She reports going to the ER for sinus pain and headaches in September 2017 when she arrived there she was very drowsy and had some word finding difficulty and slurred speech. Because of concern of stroke, she had CT head which returned normal.  Notes indicated that she appeared very tired and drowsy and even patient endorses feeling tired.  She did not feel that she was having a stroke and denies lateralizing weakness, numbness/tingling, or facial droop.  She did not think that her speech was slurred.  Patient was treated for her headaches and given fluids and within a few hours, she was feeling well. She did not have any persistent dysarthria or word finding difficulty and she was discharged. She was able to drive herself from the ER back home without any difficulty.  Emergency department notes indicate that the provider did not feel that she was having a stroke or TIA and suspected drowsiness and speech changes are due to her medications. She was referred for further evaluation. Since this time, she has not had any further spells of slurred speech. She is unsure why she is here today.   She has no prior history of stroke or cardiac disease.  Stroke risk factors include diabetes, hypertension, and hyperlipidemia.  She is takes a daily aspirin 81 mg the past 2  years. Family history is notable for her mother who had a stroke.  Upon further questioning, patient admits to being very noncompliant with all of her medications.  She is the primary caregiver to her mother who is suffering from dementia and states that she stopped taking care of herself to be able to do this.  She also suffers from a lot of joint pain and bilateral knee pain. She has had multiple emergency room visits for knee pain, most recently yesterday.  She has been ambulating with a cane the past few weeks because of her pain.   Out-side paper records, electronic medical record, and images have been reviewed where available and summarized as:  Lab Results  Component Value Date   HGBA1C 8.1 (H) 09/25/2015   Lab Results  Component Value Date   CHOL 258 (H) 01/23/2015   HDL 39 (L) 01/23/2015   LDLCALC 169 (H) 01/23/2015   TRIG 252 (H) 01/23/2015   CHOLHDL 6.6 (H) 01/23/2015    Lab Results  Component Value Date   TSH 0.76 09/25/2015    Past Medical History:  Diagnosis Date  . Allergy   . Anemia   . Asthma   . Bipolar 2 disorder ([place])   . Diabetes mellitus without complication ([place])   . GERD (gastroesophageal reflux disease)   . Glaucoma    Pt denies  . Hernia   . Hypertension     Past Surgical History:  Procedure Laterality Date  . HERNIA REPAIR    . TONSILLECTOMY  Pt denies  . TUBAL LIGATION       Medications:  Outpatient Encounter Prescriptions as of 03/10/2016  Medication Sig Note  . ACCU-CHEK SOFTCLIX LANCETS lancets Test blood sugar once daily. Dx: E11.42   . ADVAIR DISKUS 250-50 MCG/DOSE AEPB Inhale 1 puff by mouth into the lungs two times daily   . Blood Glucose Calibration (ACCU-CHEK AVIVA) SOLN Test blood sugar once daily. Dx: E11.42   . blood glucose meter kit and supplies KIT Use as directed 4 times a day.  DX: R73.09   . Blood Glucose Monitoring Suppl (ACCU-CHEK AVIVA PLUS) w/Device KIT Test blood sugar once daily. Dx: E11.42   . cetirizine  (ZYRTEC ALLERGY) 10 MG tablet Take 1 tablet (10 mg total) by mouth daily.   . colchicine 0.6 MG tablet Take 1 tablet (0.6 mg total) by mouth daily.   . diclofenac sodium (VOLTAREN) 1 % GEL Apply 4 g topically 2 (two) times daily. (Patient taking differently: Apply 4 g topically 2 (two) times daily as needed (pain). )   . enalapril (VASOTEC) 10 MG tablet Take 10 mg by mouth daily.   . fluticasone (FLONASE) 50 MCG/ACT nasal spray PLACE 2 SPRAYS INTO BOTH NOSTRILS AT BEDTIME. (Patient taking differently: Place 1 spray into both nostrils daily. )   . furosemide (LASIX) 40 MG tablet TK 1 T PO QD 03/10/2016: Received from: External Pharmacy  . glucose blood (ACCU-CHEK AVIVA PLUS) test strip Test blood sugar once daily. Dx: E11.42   . hydrOXYzine (ATARAX/VISTARIL) 25 MG tablet Take 25 mg by mouth every 6 (six) hours as needed for anxiety or itching.    Marland Kitchen ipratropium (ATROVENT HFA) 17 MCG/ACT inhaler Inhale 2 puffs into the lungs every 4 (four) hours as needed for wheezing.   Marland Kitchen lisinopril (PRINIVIL,ZESTRIL) 40 MG tablet TK 1 T PO QD 03/10/2016: Received from: External Pharmacy  . meloxicam (MOBIC) 15 MG tablet TK 1 T PO QD 03/10/2016: Received from: External Pharmacy  . metFORMIN (GLUCOPHAGE-XR) 500 MG 24 hr tablet Take 1,000 mg by mouth at bedtime.   . methylPREDNISolone (MEDROL) 8 MG tablet Take 4 pills for 3 days, then 3 pills for 3 days, then 2 pills for 3 days, then 1 pill for 3 days   . oxybutynin (DITROPAN XL) 15 MG 24 hr tablet TAKE 1 TABLET BY MOUTH AT  BEDTIME (Patient taking differently: TAKE 15 MG BY MOUTH AT  BEDTIME)   . PARoxetine (PAXIL) 10 MG tablet Take 10 mg by mouth daily.   . pravastatin (PRAVACHOL) 40 MG tablet TAKE 1 TABLET BY MOUTH  DAILY (Patient taking differently: TAKE 40 MG BY MOUTH  DAILY)   . traMADol (ULTRAM) 50 MG tablet Take 50 mg by mouth every 6 (six) hours as needed for moderate pain or severe pain.    Marland Kitchen triamterene-hydrochlorothiazide (MAXZIDE-25) 37.5-25 MG tablet Take 1  tablet by mouth daily.    . VENTOLIN HFA 108 (90 Base) MCG/ACT inhaler Inhale 2 puffs by mouth  every 6 hours as needed for wheezing (Patient taking differently: Inhale 2 puffs into the lungs every 6 (six) hours as needed for wheezing or shortness of breath. )   . zinc oxide (BALMEX) 11.3 % CREA cream Apply 1 application topically 2 (two) times daily. Apply to Right Buttock    No facility-administered encounter medications on file as of 03/10/2016.      Allergies:  Allergies  Allergen Reactions  . Fish Allergy Anaphylaxis  . Banana Other (See Comments)    unknown  .  Other     Cockroaches, rag weed, pollen,- showed up in allergy test unknown reaction  . Prednisone Other (See Comments)    Anxiety, erratic behavior, ?hallucinations Requiring ED and psychiatric evaluation    Family History: Family History  Problem Relation Age of Onset  . Hypertension Mother   . Diabetes Mother   . Hyperlipidemia Mother   . Stroke Mother   . Heart disease Mother   . Pulmonary embolism Mother   . Hypertension Father   . Diabetes Father   . Hyperlipidemia Father     Social History: Social History  Substance Use Topics  . Smoking status: Never Smoker  . Smokeless tobacco: Never Used  . Alcohol use 0.0 oz/week     Comment: Wine, Socially    Social History   Social History Narrative   Lives with her daughter. Her other daughter lives on campus at college.     On disability.    Review of Systems:  CONSTITUTIONAL: No fevers, chills, night sweats, or weight loss.   EYES: No visual changes or eye pain ENT: No hearing changes.  No history of nose bleeds.   RESPIRATORY: No cough, wheezing and shortness of breath.   CARDIOVASCULAR: Negative for chest pain, and palpitations.   GI: Negative for abdominal discomfort, blood in stools or black stools.  No recent change in bowel habits.   GU:  No history of incontinence.   MUSCLOSKELETAL: ++history of joint pain or swelling.  No myalgias.   SKIN:  Negative for lesions, rash, and itching.   HEMATOLOGY/ONCOLOGY: Negative for prolonged bleeding, bruising easily, and swollen nodes.  No history of cancer.   ENDOCRINE: Negative for cold or heat intolerance, polydipsia or goiter.   PSYCH:  +depression or anxiety symptoms.   NEURO: As Above.   Vital Signs:  BP 120/84   Pulse 96   Ht _0  (1.575 m)   Wt 220 lb (99.8 kg)   LMP 05/30/2013   SpO2 98%   BMI 40.24 kg/m    General Medical Exam:   General:  Patient arrived in wheelchair, she is comfortable and well appearing.    Eyes/ENT: see cranial nerve examination.   Neck: No masses appreciated.  Full range of motion without tenderness.  No carotid bruits. Respiratory:  Clear to auscultation, good air entry bilaterally.   Cardiac:  Regular rate and rhythm, no murmur.   Extremities:  No deformities, edema, or skin discoloration.  Skin:  No rashes or lesions.  Neurological Exam: MENTAL STATUS including orientation to time, place, person, recent and remote memory, attention span and concentration, language, and fund of knowledge is normal.  Speech is not dysarthric. Naming and repetition intact.  CRANIAL NERVES: II:  No visual field defects.  Unremarkable fundi.   III-IV-VI: Pupils equal round and reactive to light.  Normal conjugate, extra-ocular eye movements in all directions of gaze.  No nystagmus.  No ptosis.   V:  Normal facial sensation.    VII:  Normal facial symmetry and movements.  VIII:  Normal hearing and vestibular function.   IX-X:  Normal palatal movement.   XI:  Normal shoulder shrug and head rotation.   XII:  Normal tongue strength and range of motion, no deviation or fasciculation.  MOTOR:  She has pain with knee extension and flexion due to gout and arthritis, but her motor strength is intact. No atrophy, fasciculations or abnormal movements.  No pronator drift.  Tone is normal.    Right Upper Extremity:  Left Upper Extremity:    Deltoid  5/5   Deltoid  5/5     Biceps  5/5   Biceps  5/5   Triceps  5/5   Triceps  5/5   Wrist extensors  5/5   Wrist extensors  5/5   Wrist flexors  5/5   Wrist flexors  5/5   Finger extensors  5/5   Finger extensors  5/5   Finger flexors  5/5   Finger flexors  5/5   Dorsal interossei  5/5   Dorsal interossei  5/5   Abductor pollicis  5/5   Abductor pollicis  5/5   Tone (Ashworth scale)  0  Tone (Ashworth scale)  0   Right Lower Extremity:    Left Lower Extremity:    Hip flexors  5/5   Hip flexors  5/5   Hip extensors  5/5   Hip extensors  5/5   Knee flexors  5/5   Knee flexors  5/5   Knee extensors  5/5   Knee extensors  5/5   Dorsiflexors  5/5   Dorsiflexors  5/5   Plantarflexors  5/5   Plantarflexors  5/5   Toe extensors  5/5   Toe extensors  5/5   Toe flexors  5/5   Toe flexors  5/5   Tone (Ashworth scale)  0  Tone (Ashworth scale)  0   MSRs:  Right                                                                 Left brachioradialis 2+  brachioradialis 2+  biceps 2+  biceps 2+  triceps 2+  triceps 2+  patellar 1+  patellar 1+  ankle jerk 0  ankle jerk 0  Hoffman no  Hoffman no  plantar response down  plantar response down   SENSORY:  Vibration is reduced at the ankles bilaterally.   COORDINATION/GAIT: Normal finger-to- nose-finger.  Intact rapid alternating movements bilaterally.  Gait is severely antalgic due to bilateral knee pain, assisted with cane.     IMPRESSION: This is a 56 year old female referred for evaluation of transient speech disturbance in September 2017. Based on my assessment, her speech changes are most likely medication side effect as she was also very drowsy at the same time and symptoms should not persist. She did not have any other focal deficits as per emergency room notes and her CT brain was negative. I do not believe her symptoms were due to a TIA.  Nevertheless, I counseled her at length about secondary stroke prevention especially with her vascular risk factors (diabetes,  hypertension, hyperlipidemia) and family history of stroke. She admits to being noncompliant with her medications and I strongly urged her to start taking all medications as prescribed.  Based on her labs, she has uncontrolled hyperlipidemia and poorly controlled diabetes, too.  For secondary stroke prevention, she will continue to take aspirin 81 mg daily, pravastatin 40 mg daily, as well as her diabetes and antihypertensive medications.  She also has signs of distal and symmetric diabetic neuropathy.  Paresthesias are not bothersome at this time.  Encouraged her to maintain tight control of her diabetes.   Return to clinic as needed.   The duration of this appointment visit was 35 minutes  of face-to-face time with the patient.  Greater than 50% of this time was spent in counseling, explanation of diagnosis, planning of further management, and coordination of care.   Thank you for allowing me to participate in patient's care.  If I can answer any additional questions, I would be pleased to do so.    Sincerely,    Evert Wenrich K. Posey Pronto, DO

## 2016-03-10 NOTE — Patient Outreach (Signed)
Transition of care call #1 completed yesterday, pt called me and told me she would not be able to meet with me on Friday. She told me she had everything she needed and understood all her instructions. I told her I would call her next week to follow up and see how she is doing.  Today, pt called me to report her complaints to me about her hospitalization. I asked if she had called the customer care person. She said no. She continued to complain that she didn't have any dressings to put over her wound and she felt they should have sent some. She was sent home with the ointment that was prescribed.  Pt is ambulatory and can drive to get any supplies she may need and I suggested she go to any drug store to pick some up. I also offered to meet with her on Friday as was our original plan but she would not commit to this suggestion.  I reminded her she has an appt today with neurology and she said she is going to try to make it. I encouraged her to do so as this is a new pt appt and they have her scheduled for a large block of time.  I will be calling pt next Thursday for follow up.  Melissa Chavez [facility]NP-BC [facility] Care Manager [phone]

## 2016-03-12 LAB — CULTURE, BLOOD (ROUTINE X 2)
Culture: NO GROWTH
Culture: NO GROWTH

## 2016-03-16 ENCOUNTER — Other Ambulatory Visit: Payer: Self-pay | Admitting: *Deleted

## 2016-03-16 NOTE — Patient Outreach (Signed)
Incoming call 03/15/16. Pt called and I was with another client.  I advised her I would call her back. I called her this morning and got her answering machine. I left a message to return my call. I also reminded her that I have her on my schedule for a follow up phone call tomorrow 03/18/15.  Almetta LovelyCarroll Noam Franzen [facility] LPGNP-BC [facility] Care Manager [phone]

## 2016-03-16 NOTE — Patient Outreach (Signed)
Incoming call from pt. Pt states I did not call her back yesterday. I advised her that was correct but I called her this morning and left a message. She said she did not get a call from me. She went on to tell me she needed help getting affordable housing and if we had funds to help with this sort of thing. I told her that was not a service we provided. I did repeat myself to tell her we have social workers that do assist pt's apply for Section 8 and as per our previous conversation, she had told me that she had "issues" with that service and would not be seeking services from them. I asked her to stand by so I could reopen my computer and tell her exactly when I called and I did (10:28 am) and gave her the number I called. She said that is one of her numbers.  I again went over the benefits of having [facility]. I asked her if she would like to meet at my office and she agreed to an appointment next Tuesday, at 10:00-12:00 am.  I am hopeful that she will come to this appointment so we can meet face to face and she can begin trusting me somewhat.  She also told me her knee is still hurting. She also did go to her neurology appt this week.  Almetta LovelyCarroll Codi Folkerts [facility]NP-BC [facility] Care Manager [phone]

## 2016-03-17 ENCOUNTER — Ambulatory Visit: Payer: Self-pay | Admitting: *Deleted

## 2016-03-22 ENCOUNTER — Other Ambulatory Visit: Payer: Self-pay | Admitting: *Deleted

## 2016-03-22 ENCOUNTER — Encounter: Payer: Self-pay | Admitting: *Deleted

## 2016-03-22 NOTE — Patient Outreach (Signed)
[facility]) Care Management   03/22/2016  Melissa Chavez 1960/10/25 [id number]  Melissa Chavez is an 56 y.o. female  Subjective: Initial face to face meeting in [facility] office rather than pt home. Pt was referred to [facility] in December for multiple ED visits. Pt most recent health problem sending her to ED was a gout attack. She had fluid drained from one knee and was given colchicine and allopurinol. She reports she cannot take the allopurinol as this made her gout worse. She is going to resume taking the colchicine as directed. She says she will also make an appt to see her primary care provider.   Pt lives with her 2 daughters, one is a Equities trader in college, the younger is a Equities trader in high school. She is also a caregiver for her mother who presently lives in an apt in [place] at [place] but pt intends to move her mother into her residence. Pt is currently trying to find a home or apt for all of them.  Objective:   Review of Systems  Constitutional: Negative.   HENT: Negative.   Eyes: Negative.   Respiratory: Negative.   Cardiovascular: Positive for leg swelling.  Gastrointestinal: Negative.   Genitourinary: Negative.   Musculoskeletal: Positive for joint pain and myalgias.       Gout.  Skin: Negative.   Neurological: Positive for tingling and headaches.       Reports tramadol causes headaches.  Endo/Heme/Allergies: Negative.   Psychiatric/Behavioral: The patient is nervous/anxious.     Physical Exam  Constitutional: She is oriented to person, place, and time. She appears well-developed and well-nourished.  Cardiovascular: Normal rate, regular rhythm and normal heart sounds.   Respiratory: Effort normal and breath sounds normal.  GI: Soft. Bowel sounds are normal.  Musculoskeletal: She exhibits edema and tenderness.  Neurological: She is alert and oriented to person, place, and time.  Skin: Skin is warm and dry.  Psychiatric:  Pt reports grief, lost 2 aunts within a short  time last year. Pt taking Paxil, denies depression. Difficulty with relationships, very strong personality, easily agitated.    Encounter Medications:   Outpatient Encounter Prescriptions as of 03/22/2016  Medication Sig Note  . ACCU-CHEK SOFTCLIX LANCETS lancets Test blood sugar once daily. Dx: E11.42   . ADVAIR DISKUS 250-50 MCG/DOSE AEPB Inhale 1 puff by mouth into the lungs two times daily   . Blood Glucose Calibration (ACCU-CHEK AVIVA) SOLN Test blood sugar once daily. Dx: E11.42   . blood glucose meter kit and supplies KIT Use as directed 4 times a day.  DX: R73.09   . Blood Glucose Monitoring Suppl (ACCU-CHEK AVIVA PLUS) w/Device KIT Test blood sugar once daily. Dx: E11.42   . cetirizine (ZYRTEC ALLERGY) 10 MG tablet Take 1 tablet (10 mg total) by mouth daily.   . colchicine 0.6 MG tablet Take 1 tablet (0.6 mg total) by mouth daily.   . diclofenac sodium (VOLTAREN) 1 % GEL Apply 4 g topically 2 (two) times daily. (Patient taking differently: Apply 4 g topically 2 (two) times daily as needed (pain). )   . enalapril (VASOTEC) 10 MG tablet Take 10 mg by mouth daily.   . fluticasone (FLONASE) 50 MCG/ACT nasal spray PLACE 2 SPRAYS INTO BOTH NOSTRILS AT BEDTIME. (Patient taking differently: Place 1 spray into both nostrils daily. )   . furosemide (LASIX) 40 MG tablet TK 1 T PO QD 03/10/2016: Received from: External Pharmacy  . glucose blood (ACCU-CHEK AVIVA PLUS) test strip Test blood  sugar once daily. Dx: E11.42   . hydrOXYzine (ATARAX/VISTARIL) 25 MG tablet Take 25 mg by mouth every 6 (six) hours as needed for anxiety or itching.    Marland Kitchen ipratropium (ATROVENT HFA) 17 MCG/ACT inhaler Inhale 2 puffs into the lungs every 4 (four) hours as needed for wheezing.   Marland Kitchen lisinopril (PRINIVIL,ZESTRIL) 40 MG tablet TK 1 T PO QD 03/10/2016: Received from: External Pharmacy  . meloxicam (MOBIC) 15 MG tablet TK 1 T PO QD 03/10/2016: Received from: External Pharmacy  . metFORMIN (GLUCOPHAGE-XR) 500 MG 24 hr tablet  Take 1,000 mg by mouth at bedtime.   . methylPREDNISolone (MEDROL) 8 MG tablet Take 4 pills for 3 days, then 3 pills for 3 days, then 2 pills for 3 days, then 1 pill for 3 days   . oxybutynin (DITROPAN XL) 15 MG 24 hr tablet TAKE 1 TABLET BY MOUTH AT  BEDTIME (Patient taking differently: TAKE 15 MG BY MOUTH AT  BEDTIME)   . PARoxetine (PAXIL) 10 MG tablet Take 10 mg by mouth daily.   . pravastatin (PRAVACHOL) 40 MG tablet TAKE 1 TABLET BY MOUTH  DAILY (Patient taking differently: TAKE 40 MG BY MOUTH  DAILY)   . traMADol (ULTRAM) 50 MG tablet Take 50 mg by mouth every 6 (six) hours as needed for moderate pain or severe pain.    Marland Kitchen triamterene-hydrochlorothiazide (MAXZIDE-25) 37.5-25 MG tablet Take 1 tablet by mouth daily.    . VENTOLIN HFA 108 (90 Base) MCG/ACT inhaler Inhale 2 puffs by mouth  every 6 hours as needed for wheezing (Patient taking differently: Inhale 2 puffs into the lungs every 6 (six) hours as needed for wheezing or shortness of breath. )   . zinc oxide (BALMEX) 11.3 % CREA cream Apply 1 application topically 2 (two) times daily. Apply to Right Buttock    No facility-administered encounter medications on file as of 03/22/2016.     Functional Status:   In your present state of health, do you have any difficulty performing the following activities: 03/07/2016 02/17/2016  Hearing? N -  Vision? N -  Difficulty concentrating or making decisions? N -  Walking or climbing stairs? Y -  Dressing or bathing? N -  Doing errands, shopping? N N  Some recent data might be hidden    Fall/Depression Screening:    PHQ 2/9 Scores 09/25/2015 08/31/2015 08/24/2015 08/22/2015 08/10/2015 07/23/2015 07/05/2015  PHQ - 2 Score 0 0 0 0 0 0 0     Fall Risk  03/22/2016 03/10/2016 08/31/2015 08/24/2015 08/22/2015  Falls in the past year? _0   Risk for fall due to : Impaired mobility - - - -    Assessment:  Acute gout pain                         Chronic OA knee pain                         Diabetes  not at goal                         Needs other residence                         Adjustment disorder  Plan:  Today, in meeting face to face we were able to establish improved rapport. Pt's main concern is her knee and foot  pain. See care plan.  [facility] CM Care Plan Problem One   Flowsheet Row Most Recent Value  Care Plan Problem One  Pt needs a care manager to help her navigate the health system and assist with her medical problems.  Role Documenting the Problem One  Care Management [place] for Problem One  Active  [facility] CM Short Term Goal #1 (0-30 days)  Pt will accept telephone calls from NP each week until we can get a face to face visit scheduled over the next month..  THN CM Short Term Goal #1 Start Date  02/18/16  [facility] CM Short Term Goal #1 Met Date  03/22/16    [facility] CM Care Plan Problem Two   Flowsheet Row Most Recent Value  Care Plan Problem Two  Muliple inappropriate ED visits.  Role Documenting the Problem Two  Care Management [place] for Problem Two  Active  Interventions for Problem Two Long Term Goal   Gave pt Know When to Go guiideline. Reinforced my availability for consultation on problems before going to ED as long as it is not a life threatening problem.  THN Long Term Goal (31-90) days  Pt will learn to call for medical problems, schedule appts for problems and follow the WHEN TO [place] for using the ED over the next 90 days.  THN Long Term Goal Start Date  03/22/16  [facility] CM Short Term Goal #1 (0-30 days)  Pt to schedule an office visit with her primary care provider in the next 2 weeks.  THN CM Short Term Goal #1 Start Date  03/22/16  Interventions for Short Term Goal #2   Encouraged pt to schedule an office visit. Offered to make appt while pt was in my office, she declined.    [facility] CM Care Plan Problem Three   Flowsheet Row Most Recent Value  Care Plan Problem Three  Chronic leg pain (osteoarthritis, gout).  Role Documenting the Problem  Three  Care Management Coordinator  Care Plan for Problem Three  Active  THN CM Short Term Goal #1 (0-30 days)  Pt chronic leg pain will be reported to be improved in the next 30 days.  THN CM Short Term Goal #1 Start Date  03/22/16  Interventions for Short Term Goal #1  Medication discussion and plan for improved control, pt cannot tolerate allopurinol and will take cholchicine as ordered. Elevate legs and exercise as tolerated.     We will meet again in February.  Deloria Lair [facility] [phone]

## 2016-03-30 ENCOUNTER — Other Ambulatory Visit: Payer: Self-pay | Admitting: *Deleted

## 2016-03-30 NOTE — Patient Outreach (Signed)
Pt called to ask if tumeric would be OK for her to take. I was able to tell her I just read an article yesterday that may benefit her to read. The article from [employer], "What Tumeric can (and Cant) Do for you. More research is needed.  Melissa Chavez had a lot on her mind about how the world around her affects her and she vented her feelings of being disadvantaged but seeing some people around her that seem to get all the breaks. I listened and supported her.  I advised her that I have sent her 2 [facility] programs: Gout and Osteoarthritis.  I encouraged her to attend her MD office visits.  I will talk to her prn and have a telephone appt with her in February.  Melissa Chavez Melissa Chavez Melissa Chavez [facility] CenterGNP-BC [facility] Care Manager [phone]

## 2016-04-08 ENCOUNTER — Ambulatory Visit: Payer: Self-pay | Admitting: Neurology

## 2016-04-18 ENCOUNTER — Other Ambulatory Visit: Payer: Self-pay | Admitting: *Deleted

## 2016-04-18 NOTE — Patient Outreach (Signed)
Telephone Assessment. Pt reports having to go to her primary care MD's office but she didn't get to see her provider as she is on maternity leave. She saw another provider. She was diagnosed with bronchitis and given an RX (Zpack). She was also complaining of side pain and she was eventually given an injection which she reports did help relieve her pain.  She criticizes all medical staff she comes in contact with. She seems to misinterpret staff actions that are meant to be kind and thoughtful as being stupid and she could surely do a lot better. She reports that she gets reactions that she interprets as uncaring. She states, "Maybe it's me?"  She tells me that she is glad I called and that she feels she can trust me. We chatted about some personal issues and then we set up a call for 2 weeks from now. I reinforced that she can call me with questions or problems.  Almetta LovelyCarroll Shahin Knierim [facility] SystemGNP-BC [facility] Care Manager [phone]

## 2016-04-20 ENCOUNTER — Emergency Department (HOSPITAL_COMMUNITY)
Admission: EM | Admit: 2016-04-20 | Discharge: 2016-04-21 | Disposition: A | Discharge status: Home / Self Care | Payer: Medicare Other | Attending: Emergency Medicine | Admitting: Emergency Medicine

## 2016-04-20 ENCOUNTER — Encounter (HOSPITAL_COMMUNITY): Payer: Self-pay | Admitting: Emergency Medicine

## 2016-04-20 ENCOUNTER — Emergency Department (HOSPITAL_COMMUNITY): Payer: Medicare Other

## 2016-04-20 DIAGNOSIS — J45909 Unspecified asthma, uncomplicated: Secondary | ICD-10-CM | POA: Insufficient documentation

## 2016-04-20 DIAGNOSIS — E119 Type 2 diabetes mellitus without complications: Secondary | ICD-10-CM | POA: Diagnosis not present

## 2016-04-20 DIAGNOSIS — R1032 Left lower quadrant pain: Secondary | ICD-10-CM | POA: Diagnosis not present

## 2016-04-20 DIAGNOSIS — I10 Essential (primary) hypertension: Secondary | ICD-10-CM | POA: Diagnosis not present

## 2016-04-20 DIAGNOSIS — R109 Unspecified abdominal pain: Secondary | ICD-10-CM

## 2016-04-20 LAB — COMPREHENSIVE METABOLIC PANEL
ALT: 15 U/L (ref 14–54)
AST: 18 U/L (ref 15–41)
Albumin: 3.8 g/dL (ref 3.5–5.0)
Alkaline Phosphatase: 84 U/L (ref 38–126)
Anion gap: 9 (ref 5–15)
BUN: 15 mg/dL (ref 6–20)
CO2: 24 mmol/L (ref 22–32)
Calcium: 9.7 mg/dL (ref 8.9–10.3)
Chloride: 107 mmol/L (ref 101–111)
Creatinine, Ser: 0.92 mg/dL (ref 0.44–1.00)
GFR calc Af Amer: >60 mL/min (ref >60)
GFR calc non Af Amer: >60 mL/min (ref >60)
Glucose, Bld: 162 mg/dL — ABNORMAL HIGH (ref 65–99)
Potassium: 3.8 mmol/L (ref 3.5–5.1)
Sodium: 140 mmol/L (ref 135–145)
Total Bilirubin: 0.4 mg/dL (ref 0.3–1.2)
Total Protein: 8.1 g/dL (ref 6.5–8.1)

## 2016-04-20 LAB — CBC
HCT: 35.8 % — ABNORMAL LOW (ref 36.0–46.0)
Hemoglobin: 12.4 g/dL (ref 12.0–15.0)
MCH: 27 pg (ref 26.0–34.0)
MCHC: 34.6 g/dL (ref 30.0–36.0)
MCV: 77.8 fL — ABNORMAL LOW (ref 78.0–100.0)
Platelets: 535 10*3/uL — ABNORMAL HIGH (ref 150–400)
RBC: 4.6 MIL/uL (ref 3.87–5.11)
RDW: 16.1 % — ABNORMAL HIGH (ref 11.5–15.5)
WBC: 9.4 10*3/uL (ref 4.0–10.5)

## 2016-04-20 LAB — LIPASE, BLOOD: Lipase: 48 U/L (ref 11–51)

## 2016-04-20 NOTE — ED Notes (Signed)
Pt is aware urine sample is needed. 

## 2016-04-20 NOTE — ED Provider Notes (Signed)
[facility] DEPT Provider Note   CSN: [id number] Arrival date & time: 04/20/16  1918   By signing my name below, I, Dolores Hoose, attest that this documentation has been prepared under the direction and in the presence of non-physician practitioner, Dewaine Oats PA-C,. Electronically Signed: Dolores Hoose, Scribe. 04/20/2016. 10:48 PM.  History   Chief Complaint Chief Complaint  Patient presents with  . Flank Pain   The history is provided by the patient. No language interpreter was used.    HPI Comments:  Melissa Chavez is a 56 y.o. female with pmhx of HTN and DM who presents to the Emergency Department complaining of constant, unchanged left sided flank and LLQ abdominal pain beginning two weeks ago. Pt states that she has been seen at [facility] x 3 for evaluation of pain since it started and that no one has been able to find a cause. She reports associated nausea without vomiting.  Her pain is exacerbated with certain positions and palpation. Pt has been taking tylenol at home for her pain with minimal relief, and received Toradol IM at her doctors office with temporary relief. Pt denies any dysuria, difficulty urinating, vomiting, SOB, blood stool or diarrhea.  Past Medical History:  Diagnosis Date  . Allergy   . Anemia   . Asthma   . Bipolar 2 disorder ([place])   . Diabetes mellitus without complication ([place])   . GERD (gastroesophageal reflux disease)   . Glaucoma    Pt denies  . Hernia   . Hypertension     Patient Active Problem List   Diagnosis Date Noted  . Pressure injury of skin 03/08/2016  . Acute gout of left knee 03/07/2016  . Sinus congestion 11/29/2015  . Bilateral leg edema 09/25/2015  . Abdominal pain, left upper quadrant 09/25/2015  . 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 Prednisone adverse reaction 08/20/2015  . Abdominal pain, chronic, epigastric 06/30/2015  . Osteoarthritis of right foot 03/22/2015  . Type 2  diabetes mellitus with diabetic polyneuropathy, without long-term current use of insulin ([place]) 03/20/2015  . BMI 40.0-44.9, adult ([place]) 06/13/2013  . Overactive bladder 06/13/2013  . Allergic rhinitis 06/13/2013  . Asthma, moderate persistent 06/13/2013  . BIPOLAR DISORDER UNSPECIFIED 03/18/2008  . PERIPHERAL EDEMA 07/03/2007  . GERD 01/30/2007  . HYPERCHOLESTEROLEMIA 12/07/2006  . Essential hypertension 12/07/2006  . Osteoarthritis of right knee 12/07/2006  . ANEMIA, IRON DEFICIENCY 01/06/2006    Past Surgical History:  Procedure Laterality Date  . deviated septum surgery x 2     . HERNIA REPAIR    . TONSILLECTOMY     Pt denies  . TUBAL LIGATION      OB History    No data available       Home Medications    Prior to Admission medications   Medication Sig Start Date End Date Taking? Authorizing Provider  ACCU-CHEK SOFTCLIX LANCETS lancets Test blood sugar once daily. Dx: E11.42 10/21/15   Wardell Honour, MD  ADVAIR DISKUS 250-50 MCG/DOSE AEPB Inhale 1 puff by mouth into the lungs two times daily 07/01/15   Shawnee Knapp, MD  Blood Glucose Calibration (ACCU-CHEK AVIVA) SOLN Test blood sugar once daily. Dx: E11.42 10/21/15   Wardell Honour, MD  blood glucose meter kit and supplies KIT Use as directed 4 times a day.  DX: R73.09 06/13/14   Shawnee Knapp, MD  Blood Glucose Monitoring Suppl (ACCU-CHEK AVIVA PLUS) w/Device KIT Test blood sugar once  daily. Dx: E11.42 10/21/15   Wardell Honour, MD  cetirizine (ZYRTEC ALLERGY) 10 MG tablet Take 1 tablet (10 mg total) by mouth daily. 11/29/15   Montine Circle, PA-C  colchicine 0.6 MG tablet Take 1 tablet (0.6 mg total) by mouth daily. 03/09/16   Annita Brod, MD  diclofenac sodium (VOLTAREN) 1 % GEL Apply 4 g topically 2 (two) times daily. Patient taking differently: Apply 4 g topically 2 (two) times daily as needed (pain).  06/30/15   Darlyne Russian, MD  enalapril (VASOTEC) 10 MG tablet Take 10 mg by mouth daily. 02/11/16   Historical Provider,  MD  fluticasone (FLONASE) 50 MCG/ACT nasal spray PLACE 2 SPRAYS INTO BOTH NOSTRILS AT BEDTIME. Patient taking differently: Place 1 spray into both nostrils daily.  07/28/15   Shawnee Knapp, MD  glucose blood (ACCU-CHEK AVIVA PLUS) test strip Test blood sugar once daily. Dx: E11.42 10/21/15   Wardell Honour, MD  hydrOXYzine (ATARAX/VISTARIL) 25 MG tablet Take 25 mg by mouth every 6 (six) hours as needed for anxiety or itching.  08/24/15   Historical Provider, MD  metFORMIN (GLUCOPHAGE-XR) 500 MG 24 hr tablet Take 1,000 mg by mouth at bedtime. 02/11/16   Historical Provider, MD  oxybutynin (DITROPAN XL) 15 MG 24 hr tablet TAKE 1 TABLET BY MOUTH AT  BEDTIME Patient taking differently: TAKE 15 MG BY MOUTH AT  BEDTIME 03/01/16   Chelle Jeffery, PA-C  PARoxetine (PAXIL) 10 MG tablet Take 10 mg by mouth daily. 10/20/15   Historical Provider, MD  pravastatin (PRAVACHOL) 40 MG tablet TAKE 1 TABLET BY MOUTH  DAILY Patient taking differently: TAKE 40 MG BY MOUTH  DAILY 03/01/16   Chelle Jeffery, PA-C  traMADol (ULTRAM) 50 MG tablet Take 50 mg by mouth every 6 (six) hours as needed for moderate pain or severe pain.  03/04/16   Historical Provider, MD  triamterene-hydrochlorothiazide (MAXZIDE-25) 37.5-25 MG tablet Take 1 tablet by mouth daily.  07/20/15   Historical Provider, MD  VENTOLIN HFA 108 (90 Base) MCG/ACT inhaler Inhale 2 puffs by mouth  every 6 hours as needed for wheezing Patient taking differently: Inhale 2 puffs into the lungs every 6 (six) hours as needed for wheezing or shortness of breath.  01/15/16   Harrison Mons, PA-C    Family History Family History  Problem Relation Age of Onset  . Hypertension Mother   . Diabetes Mother   . Hyperlipidemia Mother   . Stroke Mother   . Heart disease Mother   . Pulmonary embolism Mother   . Hypertension Father   . Diabetes Father   . Hyperlipidemia Father     Social History Social History  Substance Use Topics  . Smoking status: Never Smoker  .  Smokeless tobacco: Never Used  . Alcohol use 0.0 oz/week     Comment: Wine, Socially      Allergies   Fish allergy; Banana; Other; and Prednisone   Review of Systems Review of Systems  Constitutional: Positive for fatigue. Negative for fever.  HENT: Negative.   Respiratory: Negative for shortness of breath.   Cardiovascular: Negative.   Gastrointestinal: Positive for abdominal pain and nausea. Negative for blood in stool, diarrhea and vomiting.  Genitourinary: Positive for flank pain. Negative for difficulty urinating and dysuria.  Musculoskeletal: Negative for myalgias.  Neurological: Positive for weakness.     Physical Exam Updated Vital Signs BP (!) 166/105 (BP Location: Right Arm)   Pulse 99   Temp 98 F (36.7 C) (  Oral)   Resp 18   LMP 05/30/2013   SpO2 96%   Physical Exam  Constitutional: She is oriented to person, place, and time. She appears well-developed and well-nourished. No distress.  HENT:  Head: Normocephalic and atraumatic.  Mouth/Throat: Mucous membranes are dry.  Eyes: Conjunctivae are normal.  Cardiovascular: Normal rate.   Pulmonary/Chest: Effort normal.  Abdominal: She exhibits no distension. There is tenderness. There is guarding. There is no rebound.  LLQ abdominal tenderness with some guarding. No peritoneal signs. Abdomen soft.   Genitourinary:  Genitourinary Comments: Left flank is TTP.  Musculoskeletal: Normal range of motion.  Neurological: She is alert and oriented to person, place, and time.  Skin: Skin is warm and dry.  Psychiatric: She has a normal mood and affect.  Nursing note and vitals reviewed.    ED Treatments / Results  DIAGNOSTIC STUDIES:  Oxygen Saturation is 96% on RA, normal by my interpretation.    COORDINATION OF CARE:  11:40 PM Discussed treatment plan with pt at bedside which includes repeat labs and imaging and pt agreed to plan.  Labs (all labs ordered are listed, but only abnormal results are  displayed) Labs Reviewed  COMPREHENSIVE METABOLIC PANEL - Abnormal; Notable for the following:       Result Value   Glucose, Bld 162 (*)    All other components within normal limits  CBC - Abnormal; Notable for the following:    HCT 35.8 (*)    MCV 77.8 (*)    RDW 16.1 (*)    Platelets 535 (*)    All other components within normal limits  LIPASE, BLOOD  URINALYSIS, ROUTINE W REFLEX MICROSCOPIC   Results for orders placed or performed during the hospital encounter of 04/20/16  Lipase, blood  Result Value Ref Range   Lipase 48 11 - 51 U/L  Comprehensive metabolic panel  Result Value Ref Range   Sodium 140 135 - 145 mmol/L   Potassium 3.8 3.5 - 5.1 mmol/L   Chloride 107 101 - 111 mmol/L   CO2 24 22 - 32 mmol/L   Glucose, Bld 162 (H) 65 - 99 mg/dL   BUN 15 6 - 20 mg/dL   Creatinine, Ser 0.92 0.44 - 1.00 mg/dL   Calcium 9.7 8.9 - 10.3 mg/dL   Total Protein 8.1 6.5 - 8.1 g/dL   Albumin 3.8 3.5 - 5.0 g/dL   AST 18 15 - 41 U/L   ALT 15 14 - 54 U/L   Alkaline Phosphatase 84 38 - 126 U/L   Total Bilirubin 0.4 0.3 - 1.2 mg/dL   GFR calc non Af Amer >60 >60 mL/min   GFR calc Af Amer >60 >60 mL/min   Anion gap 9 5 - 15  CBC  Result Value Ref Range   WBC 9.4 4.0 - 10.5 K/uL   RBC 4.60 3.87 - 5.11 MIL/uL   Hemoglobin 12.4 12.0 - 15.0 g/dL   HCT 35.8 (L) 36.0 - 46.0 %   MCV 77.8 (L) 78.0 - 100.0 fL   MCH 27.0 26.0 - 34.0 pg   MCHC 34.6 30.0 - 36.0 g/dL   RDW 16.1 (H) 11.5 - 15.5 %   Platelets 535 (H) 150 - 400 K/uL    EKG  EKG Interpretation None       Radiology No results found. Ct Abdomen Pelvis W Contrast  Result Date: 04/21/2016 CLINICAL DATA:  Left lower quadrant and left flank pain for 2 weeks. EXAM: CT ABDOMEN AND PELVIS WITH CONTRAST  TECHNIQUE: Multidetector CT imaging of the abdomen and pelvis was performed using the standard protocol following bolus administration of intravenous contrast. CONTRAST:  CT from 10/02/2015 COMPARISON:  100 cc Isovue-300 IV FINDINGS:  Lower chest: Normal sized cardiac chambers. Minimal atelectasis and/or scarring at the right base. Tiny 2 mm nodular density at the right lung base is believed to be a vessel seen on end. Small hiatal hernia. Hepatobiliary: 2.1 cm gallbladder neck stone without wall thickening of the gallbladder but distension noted. Near the fundus, there are faint calcifications associated with the gallbladder wall potentially representing a developing porcelain gallbladder. Liver demonstrates no space-occupying mass. Pancreas: Slightly hazy appearance of the distal body and tail of the pancreas. Correlate to exclude acute mild pancreatitis. Spleen: No splenomegaly or mass. Adrenals/Urinary Tract: Stable bilateral renal cysts. No obstructive uropathy. Nodular thickening of the left adrenal gland up to 1.4 cm question adenoma. No nephrolithiasis. No hydroureteronephrosis. Urinary bladder is physiologically distended. Stomach/Bowel: Contracted stomach with normal small bowel rotation. No bowel obstruction. Normal appendix. Colonic diverticulosis along the descending and sigmoid colon. No acute diverticulitis. Vascular/Lymphatic: No aortic aneurysm. Mild aortoiliac atherosclerosis. No lymphadenopathy. Reproductive: Uterus and bilateral adnexa are unremarkable. Other: No abdominal wall hernia or abnormality. Ventral hernia repair. No abdominopelvic ascites. Musculoskeletal: Degenerative disc disease L5-S1. Minimal anterolisthesis of L4 on L5 likely from degenerative facet arthropathy. No spondylolysis. IMPRESSION: 1. 2.1 cm gallstone seen in the neck of the gallbladder with faint mural calcification of the gallbladder wall suspicious for developing porcelain gallbladder. No secondary signs of acute cholecystitis however. There is no wall thickening nor pericholecystic fluid. 2. Hazy appearance of the pancreatic body and tail. Correlate to exclude pancreatitis. Some of the haziness may be secondary to fatty atrophy. 3. Colonic  diverticulosis without acute diverticulitis along the descending and sigmoid colon. 4. Stable bilateral renal cysts without obstructive uropathy. 5. Lower lumbar degenerative disc and facet arthropathy. Electronically Signed   By: Ashley Royalty M.D.   On: 04/21/2016 02:02    Procedures Procedures (including critical care time)  Medications Ordered in ED Medications - No data to display   Initial Impression / Assessment and Plan / ED Course  I have reviewed the triage vital signs and the nursing notes.  Pertinent labs & imaging results that were available during my care of the patient were reviewed by me and considered in my medical decision making (see chart for details).     Patient with left flank and left lower abdomen pain for the past 2 weeks with multiple evaluation at primary care for treatment. No history of kidney stones. No fever at any time, no vomiting. Her abdominal pain has progressed to mild diffuse pain with still greatest pain in LLQ. She is also now having non-bloody diarrhea.   The patient is very somnolent but easily wakened. "I'm just tired of dealing with this pain". She is afebrile, borderline tachycardic. Abdomen is significantly tender to LLQ and there is tenderness to left flank as well. Labs so far unremarkable. CT ordered for full evaluation, r/o diverticulitis or other process. UA pending. Will continue to monitor.   Re-evaluation after CT finds patient improved, resting easily. VSS. CT does not determine cause of her pain tonight. No acute findings. She can be discharged home with PCP follow up tomorrow.   Final Clinical Impressions(s) / ED Diagnoses   Final diagnoses:  None   1. Flank pain 2. Abdominal pain  New Prescriptions New Prescriptions   No medications on file  I personally  performed the services described in this documentation, which was scribed in my presence. The recorded information has been reviewed and is accurate.      Charlann Lange,  PA-C 04/21/16 [id number]    Varney Biles, MD 04/21/16 1427

## 2016-04-20 NOTE — ED Triage Notes (Signed)
Pt is c/o left flank pain that started a couple of weeks ago  Pt states she has been to her dr 3 times for this but they are unable to find the cause  Pt states they have done blood work and an abd xray  Pt states today she has had nausea and a little diarrhea

## 2016-04-20 NOTE — ED Notes (Signed)
From [facility]-abdominal pain for 2 weeks, not getting better

## 2016-04-21 ENCOUNTER — Emergency Department (HOSPITAL_COMMUNITY): Payer: Medicare Other

## 2016-04-21 ENCOUNTER — Encounter (HOSPITAL_COMMUNITY): Payer: Self-pay

## 2016-04-21 LAB — URINALYSIS, ROUTINE W REFLEX MICROSCOPIC
Bilirubin Urine: NEGATIVE
Glucose, UA: NEGATIVE mg/dL
Hgb urine dipstick: NEGATIVE
Ketones, ur: NEGATIVE mg/dL
Leukocytes, UA: NEGATIVE
Nitrite: NEGATIVE
Protein, ur: NEGATIVE mg/dL
Specific Gravity, Urine: 1.032 — ABNORMAL HIGH (ref 1.005–1.030)
pH: 5 (ref 5.0–8.0)

## 2016-04-21 MED ORDER — IOPAMIDOL (ISOVUE-300) INJECTION 61%
100.0000 mL | Freq: Once | INTRAVENOUS | Status: AC | PRN
  Administered 2016-04-21: 100 mL via INTRAVENOUS

## 2016-04-21 MED ORDER — ACETAMINOPHEN 325 MG PO TABS
650.0000 mg | ORAL_TABLET | Freq: Once | ORAL | Status: AC
Start: 2016-04-21 — End: 2016-04-21
  Administered 2016-04-21: 650 mg via ORAL
  Filled 2016-04-21: qty 2

## 2016-04-21 MED ORDER — SODIUM CHLORIDE 0.9 % IJ SOLN
INTRAMUSCULAR | Status: AC
  Filled 2016-04-21: qty 50

## 2016-04-21 MED ORDER — IOPAMIDOL (ISOVUE-300) INJECTION 61%
INTRAVENOUS | Status: AC
  Administered 2016-04-21: 100 mL via INTRAVENOUS
  Filled 2016-04-21: qty 100

## 2016-04-21 MED ORDER — MORPHINE SULFATE (PF) 4 MG/ML IV SOLN
4.0000 mg | Freq: Once | INTRAVENOUS | Status: AC
  Administered 2016-04-21: 4 mg via INTRAVENOUS
  Filled 2016-04-21: qty 1

## 2016-04-25 ENCOUNTER — Other Ambulatory Visit: Payer: Self-pay | Admitting: *Deleted

## 2016-04-25 NOTE — Patient Outreach (Signed)
Telephone call to pt to follow up about abdominal pain. I had a [facility] to discuss her visit to the walk in clinic and then subsequent referral to the emergency room. In reviewing pt chart she did have a CT of the abdomen that did not reveal any acute process going on. Dr. Celene SkeenIbarra suggests that if Ms. Melissa Chavez continues to have this abdominal pain she should be referred to a surgeon for further work up.  I was able to speak with Mrs. Melissa Chavez who continues to complain of Left sided abdominal pain. We discussed the results of her CT which did show she has a gall stone. Today she reports she continues to have this pain. I advised her that Dr. Celene SkeenIbarra has recommended she consult with a surgeon if her pain continues and she says she would like to schedule an appointment with Melissa Chavez who treated her sister. I advised her I would take take of this referral. She expressed her gratitude.  I called [facility] and talked with the practice manager, Oletta DarterNoki Ford, who will pass the message to Dr. Celene SkeenIbarra to make this surgical referral.  I will stay in touch with Asmaa throughout this investigation.  Almetta LovelyCarroll Judy Pollman [facility] Care Manager [phone]

## 2016-05-02 ENCOUNTER — Other Ambulatory Visit: Payer: Self-pay | Admitting: Family Medicine

## 2016-05-02 ENCOUNTER — Ambulatory Visit: Payer: Self-pay | Admitting: *Deleted

## 2016-05-02 ENCOUNTER — Other Ambulatory Visit: Payer: Self-pay | Admitting: Physician Assistant

## 2016-05-11 ENCOUNTER — Other Ambulatory Visit: Payer: Self-pay | Admitting: *Deleted

## 2016-05-11 NOTE — Patient Outreach (Signed)
Melissa Chavez called me today. She reports she was to have her surgery consult tomorrow but doesn't have the $35 for the co-pay. She reports she still has abdominal pains on her L side which radiates around her L side towards her back. She is taking APAP for pain. She reports a lot of stress. She has seen providers at her primary office and has been given advice on medication and exercises that may help to alleviate her discomfort. She was of course referred for the surgery consult by Dr. Susette RacerIberra. She continues to complain also of arthritic knee pain which is worse with rainy weather.  We discussed the need for her to reschedule her appt and avoid going to the ED. I have given her some suggestions regarding stress management: deep breathing, being mindful and letting go of things she cannot control. She was very appreciative of my listening, and caring about her. She appreciated the stress management suggestions and she said she will be rescheduling her operative consult.  I encouraged her to call me anytime and I will be checkin in with her in 2 weeks.  Almetta LovelyCarroll Qadir Folks [facility]NP-BC [facility] Care Manager [phone] .

## 2016-06-20 ENCOUNTER — Other Ambulatory Visit: Payer: Self-pay | Admitting: *Deleted

## 2016-06-21 NOTE — Patient Outreach (Signed)
Attempted call to pt but no answer. I left a message requesting a call back.  [name] [facility] Care Manager [phone]

## 2016-06-29 ENCOUNTER — Other Ambulatory Visit: Payer: Self-pay | Admitting: *Deleted

## 2016-07-05 ENCOUNTER — Encounter: Payer: Self-pay | Admitting: *Deleted

## 2016-07-05 ENCOUNTER — Other Ambulatory Visit: Payer: Self-pay | Admitting: *Deleted

## 2016-07-05 NOTE — Patient Outreach (Signed)
Case closure phone call.   I have called Ms. Mullinix several times and she has returned my call once. I was able to reach her today. She reports she has never followed up with getting a surgical consult because she doesn't have the copayment. She says she still has the pain but she is living with it.  I have encouraged her to please call and get a rescheduled appt so she can determine if this problem is related to her gall bladder or another problem. She said she will when she gets the money together.  She reports she has a lot of trouble communicating to others to get what she needs. I have encouraged her to be patient and be very simple in her requests.  I have advised her that I am closing her case today. I have told her she can call me for advise in the future, should she need some assistance.  [name] [facility] Care Manager [phone]

## 2016-08-09 ENCOUNTER — Ambulatory Visit (INDEPENDENT_AMBULATORY_CARE_PROVIDER_SITE_OTHER): Payer: Medicare Other | Admitting: Orthopaedic Surgery

## 2016-08-09 ENCOUNTER — Other Ambulatory Visit (INDEPENDENT_AMBULATORY_CARE_PROVIDER_SITE_OTHER): Payer: Self-pay

## 2016-08-09 DIAGNOSIS — M25561 Pain in right knee: Secondary | ICD-10-CM

## 2016-08-09 DIAGNOSIS — M25562 Pain in left knee: Principal | ICD-10-CM

## 2016-08-09 DIAGNOSIS — M1711 Unilateral primary osteoarthritis, right knee: Secondary | ICD-10-CM

## 2016-08-09 DIAGNOSIS — M1712 Unilateral primary osteoarthritis, left knee: Secondary | ICD-10-CM | POA: Diagnosis not present

## 2016-08-09 DIAGNOSIS — G8929 Other chronic pain: Secondary | ICD-10-CM

## 2016-08-09 MED ORDER — LIDOCAINE HCL 1 % IJ SOLN
3.0000 mL | INTRAMUSCULAR | Status: AC | PRN
  Administered 2016-08-09: 3 mL

## 2016-08-09 MED ORDER — METHYLPREDNISOLONE ACETATE 40 MG/ML IJ SUSP
40.0000 mg | INTRAMUSCULAR | Status: AC | PRN
  Administered 2016-08-09: 40 mg via INTRA_ARTICULAR

## 2016-08-09 NOTE — Progress Notes (Signed)
Office Visit Note   Patient: Melissa Chavez           Date of Birth: 04/25/1960           MRN: [id number] Visit Date: 08/09/2016              Requested by: Juluis RainierBarnes, Elizabeth, MD [address] [place], [place] [address] PCP: Sigmund HazelMiller, Lisa, MD   Assessment & Plan: Visit Diagnoses:  1. Chronic pain of left knee   2. Chronic pain of right knee   3. Unilateral primary osteoarthritis, left knee   4. Unilateral primary osteoarthritis, right knee     Plan: Regular try to be as conservative as possible. I counseled her about blood glucose control on weight loss. I do feel that she'll benefit from physical therapy for generalized conditioning but mainly quad strengthening exercises and try to work on balance coordination and supporting her knees in general. I do feel that she'll benefit from a steroid injection both knees counseled her about this significantly about how this can affect her blood glucose. Both knees she tolerated injections well. Do feel that she is a candidate for hyaluronic acid negative her handout on Monovisc. I'll see her back in 4 weeks if she doing overall but hopefully that point can provide a hyaluronic acid injection in each of her knees and see how therapy is done for her as well. All questions were encouraged and answered.  Follow-Up Instructions: Return in about 4 weeks (around 09/06/2016).   Orders:  Orders Placed This Encounter  Procedures  . Large Joint Injection/Arthrocentesis  . Large Joint Injection/Arthrocentesis   No orders of the defined types were placed in this encounter.     Procedures: Large Joint Inj Date/Time: 08/09/2016 9:13 AM Performed by: Kathryne HitchBLACKMAN, CHRISTOPHER Y Authorized by: Doneen PoissonBLACKMAN, CHRISTOPHER Y   Location:  Knee Site:  R knee Ultrasound Guidance: No   Fluoroscopic Guidance: No   Arthrogram: No   Medications:  3 mL lidocaine 1 %; 40 mg methylPREDNISolone acetate 40 MG/ML Large Joint Inj Date/Time: 08/09/2016 9:14 AM Performed  by: Kathryne HitchBLACKMAN, CHRISTOPHER Y Authorized by: Kathryne HitchBLACKMAN, CHRISTOPHER Y   Location:  Knee Site:  L knee Ultrasound Guidance: No   Fluoroscopic Guidance: No   Arthrogram: No   Medications:  3 mL lidocaine 1 %; 40 mg methylPREDNISolone acetate 40 MG/ML     Clinical Data: No additional findings.   Subjective: No chief complaint on file. Patient is a very pleasant 56 year old who comes in with chief complaint of bilateral knee pain. Her right knee is hurt for over 20 years the left is on her for about 6 months. She's been in the emergency room recently to have fluid drained off her left knee. She denies any specific injury. She is a diabetic and is significantly obese. She hopes her nasal for better with time. She does use a cane is been going to the wide try to lose weight and get stronger in general. Her pain is daily though it is detrimentally affected her activities daily living, her quality of life, and her mobility. She will still be as active as possible. Her pain can be 10 out of 10 at times.  HPI  Review of Systems He currently denies any headache, chest pain, shortness breath, fever, chills, nausea, vomiting.  Objective: Vital Signs: LMP 05/30/2013   Physical Exam She is alert and oriented 3 and in no acute distress Ortho Exam Examination of both knee show significant patellofemoral crepitation. There is medial  joint line tenderness bilaterally with painful range of motion and a mild effusion bilaterally. The knee still ligaments stable but her just very painful in general. Specialty Comments:  No specialty comments available.  Imaging: No results found. X-rays of both her knees and patellar reviewed by me that her on canopy system show moderate arthritic changes involving mainly the patellofemoral joint and the medial joint line of both knees. There is no significant malalignment of either knee but definitely arthritis. There is particular osteophytes throughout the  knees  PMFS History: Patient Active Problem List   Diagnosis Date Noted  . Pressure injury of skin 03/08/2016  . Acute gout of left knee 03/07/2016  . Sinus congestion 11/29/2015  . Bilateral leg edema 09/25/2015  . Abdominal pain, left upper quadrant 09/25/2015  . 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 Prednisone adverse reaction 08/20/2015  . Abdominal pain, chronic, epigastric 06/30/2015  . Osteoarthritis of right foot 03/22/2015  . Type 2 diabetes mellitus with diabetic polyneuropathy, without long-term current use of insulin (HCC) 03/20/2015  . BMI 40.0-44.9, adult (HCC) 06/13/2013  . Overactive bladder 06/13/2013  . Allergic rhinitis 06/13/2013  . Asthma, moderate persistent 06/13/2013  . BIPOLAR DISORDER UNSPECIFIED 03/18/2008  . PERIPHERAL EDEMA 07/03/2007  . GERD 01/30/2007  . HYPERCHOLESTEROLEMIA 12/07/2006  . Essential hypertension 12/07/2006  . Osteoarthritis of right knee 12/07/2006  . ANEMIA, IRON DEFICIENCY 01/06/2006   Past Medical History:  Diagnosis Date  . Allergy   . Anemia   . Asthma   . Bipolar 2 disorder (HCC)   . Diabetes mellitus without complication (HCC)   . GERD (gastroesophageal reflux disease)   . Glaucoma    Pt denies  . Hernia   . Hypertension     Family History  Problem Relation Age of Onset  . Hypertension Mother   . Diabetes Mother   . Hyperlipidemia Mother   . Stroke Mother   . Heart disease Mother   . Pulmonary embolism Mother   . Hypertension Father   . Diabetes Father   . Hyperlipidemia Father     Past Surgical History:  Procedure Laterality Date  . deviated septum surgery x 2     . HERNIA REPAIR    . TONSILLECTOMY     Pt denies  . TUBAL LIGATION     Social History   Occupational History  . disabled     asthma   Social History Main Topics  . Smoking status: Never Smoker  . Smokeless tobacco: Never Used  . Alcohol use 0.0 oz/week     Comment: Wine, Socially   . Drug use: No  . Sexual  activity: No

## 2016-08-16 ENCOUNTER — Telehealth (INDEPENDENT_AMBULATORY_CARE_PROVIDER_SITE_OTHER): Payer: Self-pay | Admitting: Orthopaedic Surgery

## 2016-08-16 NOTE — Telephone Encounter (Signed)
Patient request a order for Physical Therapy.

## 2016-08-17 ENCOUNTER — Other Ambulatory Visit (INDEPENDENT_AMBULATORY_CARE_PROVIDER_SITE_OTHER): Payer: Self-pay

## 2016-08-17 DIAGNOSIS — M25562 Pain in left knee: Principal | ICD-10-CM

## 2016-08-17 DIAGNOSIS — M25561 Pain in right knee: Principal | ICD-10-CM

## 2016-08-17 DIAGNOSIS — G8929 Other chronic pain: Secondary | ICD-10-CM

## 2016-08-17 NOTE — Telephone Encounter (Signed)
Order sent.

## 2016-08-22 ENCOUNTER — Other Ambulatory Visit: Payer: Self-pay | Admitting: Family Medicine

## 2016-08-22 ENCOUNTER — Other Ambulatory Visit: Payer: Self-pay | Admitting: Physician Assistant

## 2016-09-05 ENCOUNTER — Telehealth (INDEPENDENT_AMBULATORY_CARE_PROVIDER_SITE_OTHER): Payer: Self-pay | Admitting: *Deleted

## 2016-09-05 NOTE — Telephone Encounter (Signed)
Can have temporary handicap placard and set her up for physical therapy to treat her knee pain and arthritis.  Nothing for her pain except anti-inflammatory meds OTC.

## 2016-09-05 NOTE — Telephone Encounter (Signed)
Pt came in asking for handicapped placard. Her's expired on 6/30. Pt asking for a referral for physical therapist.

## 2016-09-05 NOTE — Telephone Encounter (Signed)
Pt also asking about something for pain

## 2016-09-05 NOTE — Telephone Encounter (Signed)
Please advise 

## 2016-09-06 ENCOUNTER — Other Ambulatory Visit (INDEPENDENT_AMBULATORY_CARE_PROVIDER_SITE_OTHER): Payer: Self-pay

## 2016-09-06 DIAGNOSIS — M25562 Pain in left knee: Secondary | ICD-10-CM

## 2016-09-06 NOTE — Telephone Encounter (Signed)
LMOM for patient handicap at front desk and order sent to PT, they will call and schedule her

## 2016-09-09 ENCOUNTER — Telehealth (INDEPENDENT_AMBULATORY_CARE_PROVIDER_SITE_OTHER): Payer: Self-pay | Admitting: Orthopaedic Surgery

## 2016-09-09 NOTE — Telephone Encounter (Signed)
PT REQUESTED A CALL BACK REGARDING HER CO PAY FOR HER NEXT VISIT. I ADVISED HER THAT SHE WOULD HAVE A CO PAY BUT SHE STILL DIDN'T UNDERSTAND WHY AND ASKED TO SPEAK WITH A NURSE.  [phone]

## 2016-09-09 NOTE — Telephone Encounter (Signed)
Can you help with this? I don't know anything about co-pay issues

## 2016-09-13 ENCOUNTER — Ambulatory Visit (INDEPENDENT_AMBULATORY_CARE_PROVIDER_SITE_OTHER): Payer: Medicare Other | Admitting: Orthopaedic Surgery

## 2016-09-13 DIAGNOSIS — M1711 Unilateral primary osteoarthritis, right knee: Secondary | ICD-10-CM

## 2016-09-13 DIAGNOSIS — M1712 Unilateral primary osteoarthritis, left knee: Secondary | ICD-10-CM | POA: Insufficient documentation

## 2016-09-13 NOTE — Progress Notes (Signed)
The patient is here today for scheduled hyaluronic acid injections for both her knees. She is already had steroid injections that lasted well until just recently. Her pain is mild to moderate her knees and she has moderate arthritis in her knees. She understands fully while we are providing these injections today.  On examination of her knees there is no effusion of either knee with good range of motion but is definitely painful along the medial joint line of both knees. There is some mild patellofemoral crepitation. Both knees are ligament is stable.  She tolerated the Monovisc injections in both knees. All questions were encouraged and answered. She knows that she can have steroid injections in her knees again some time in the next 3 months or the fall if needed. She'll also follow-up as needed.

## 2016-09-20 ENCOUNTER — Telehealth (INDEPENDENT_AMBULATORY_CARE_PROVIDER_SITE_OTHER): Payer: Self-pay | Admitting: Radiology

## 2016-09-20 NOTE — Telephone Encounter (Signed)
Patient calling today she is status post bilateral monovisc injections on 09/13/16. She said she did not notice anything the day after injection. She states the 2nd and 3rd day she began having body aches. She denies fever, chills, nausea, or any acute swelling. She denies any other symptoms. She would like to let you know what is going on. Should she just be giving the injection more time to work, should she get another cortisone injection. Ph [phone]. Please advise.

## 2016-09-21 NOTE — Telephone Encounter (Signed)
Certainly she should give those injctions much, much longer to work

## 2016-09-21 NOTE — Telephone Encounter (Signed)
I called and advise patient of message below. She will let us know if she has any other problems.

## 2016-09-26 ENCOUNTER — Other Ambulatory Visit: Payer: Self-pay | Admitting: Physician Assistant

## 2016-10-10 ENCOUNTER — Other Ambulatory Visit: Payer: Self-pay | Admitting: Family Medicine

## 2017-03-03 IMAGING — CR DG CHEST 2V
2 series · 2 of 2 positions shown · non-contrast
Comparison: 07/28/2015

CLINICAL DATA: Chest pain, cough, shortness of breath

EXAM:
CHEST  2 VIEW

[w chest pa]
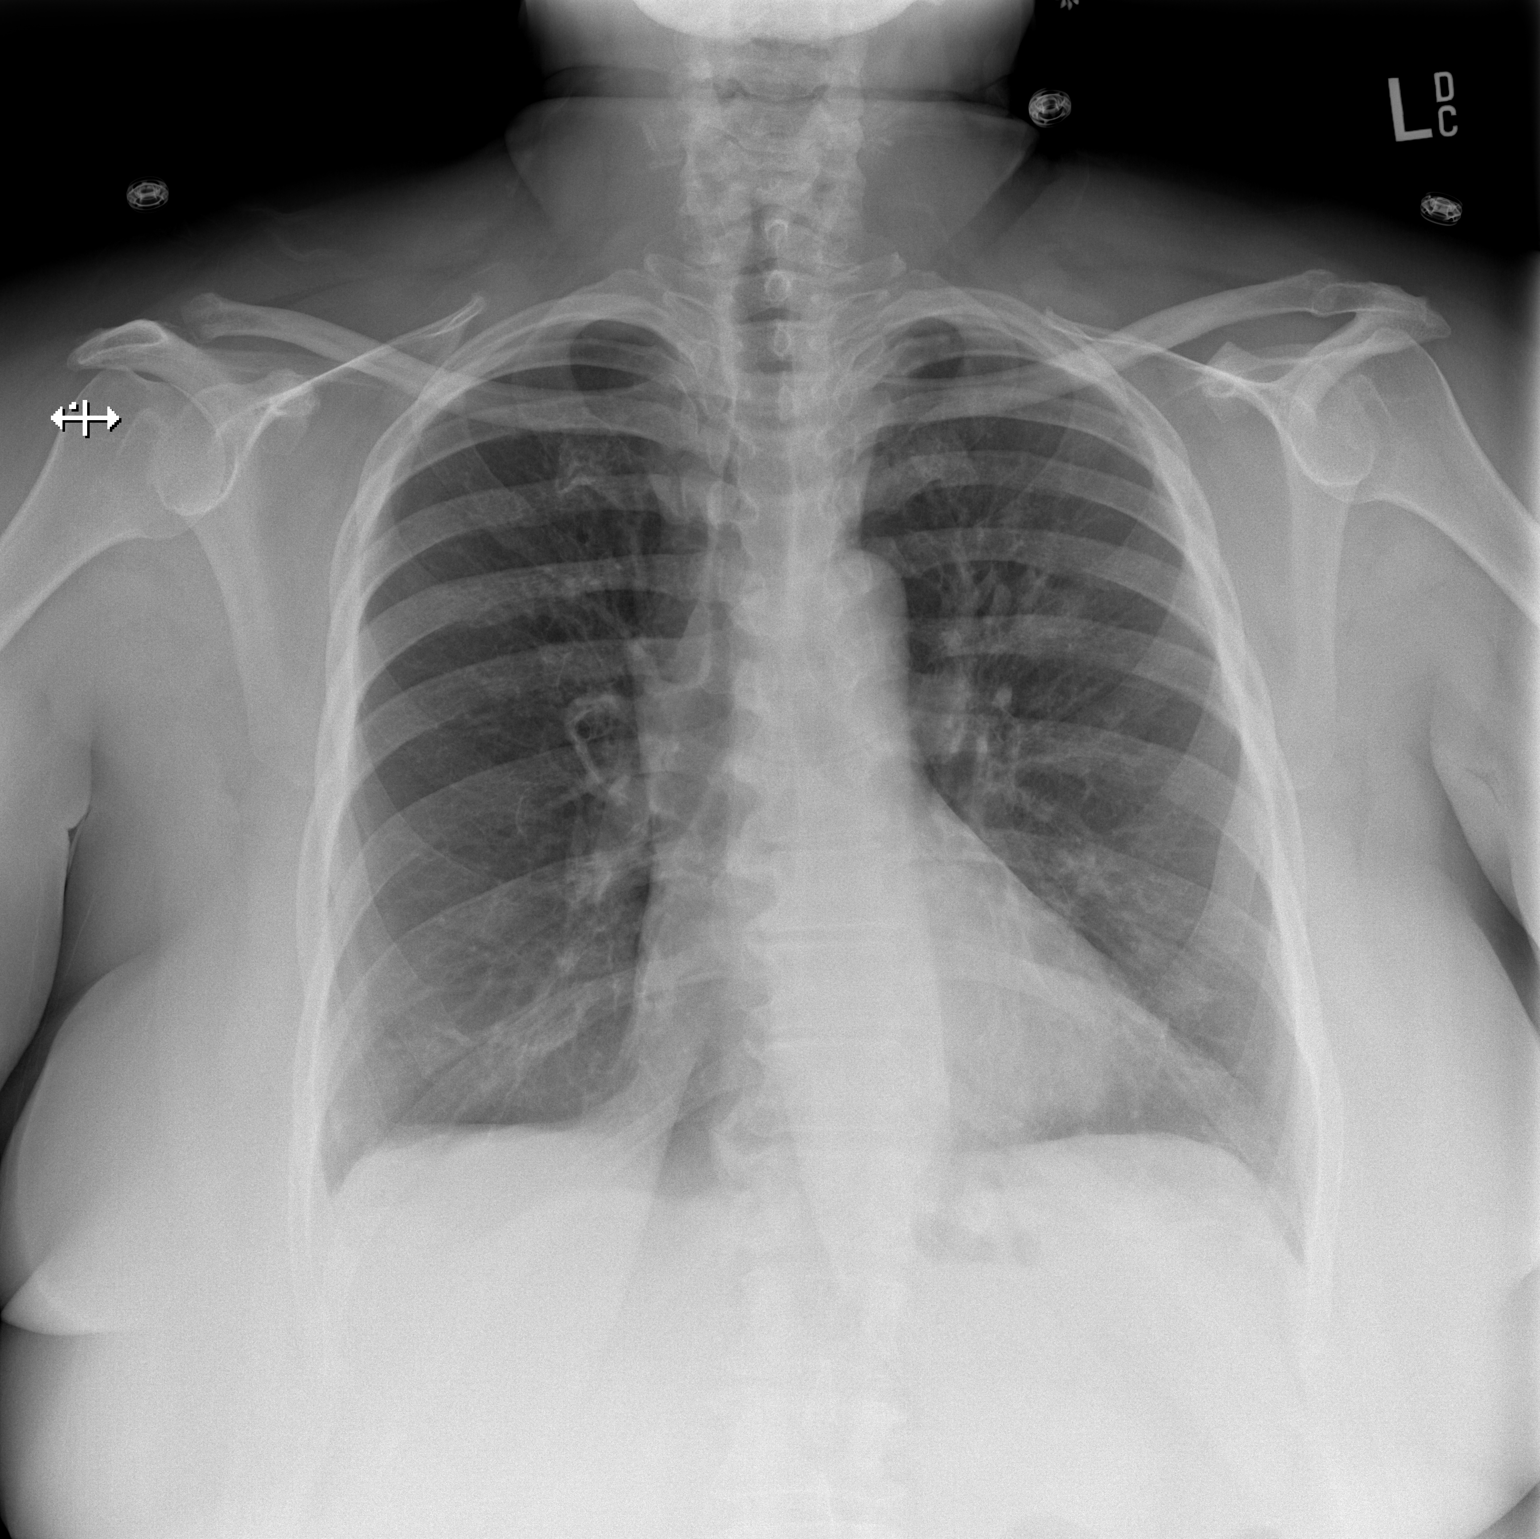

[w chest lat]
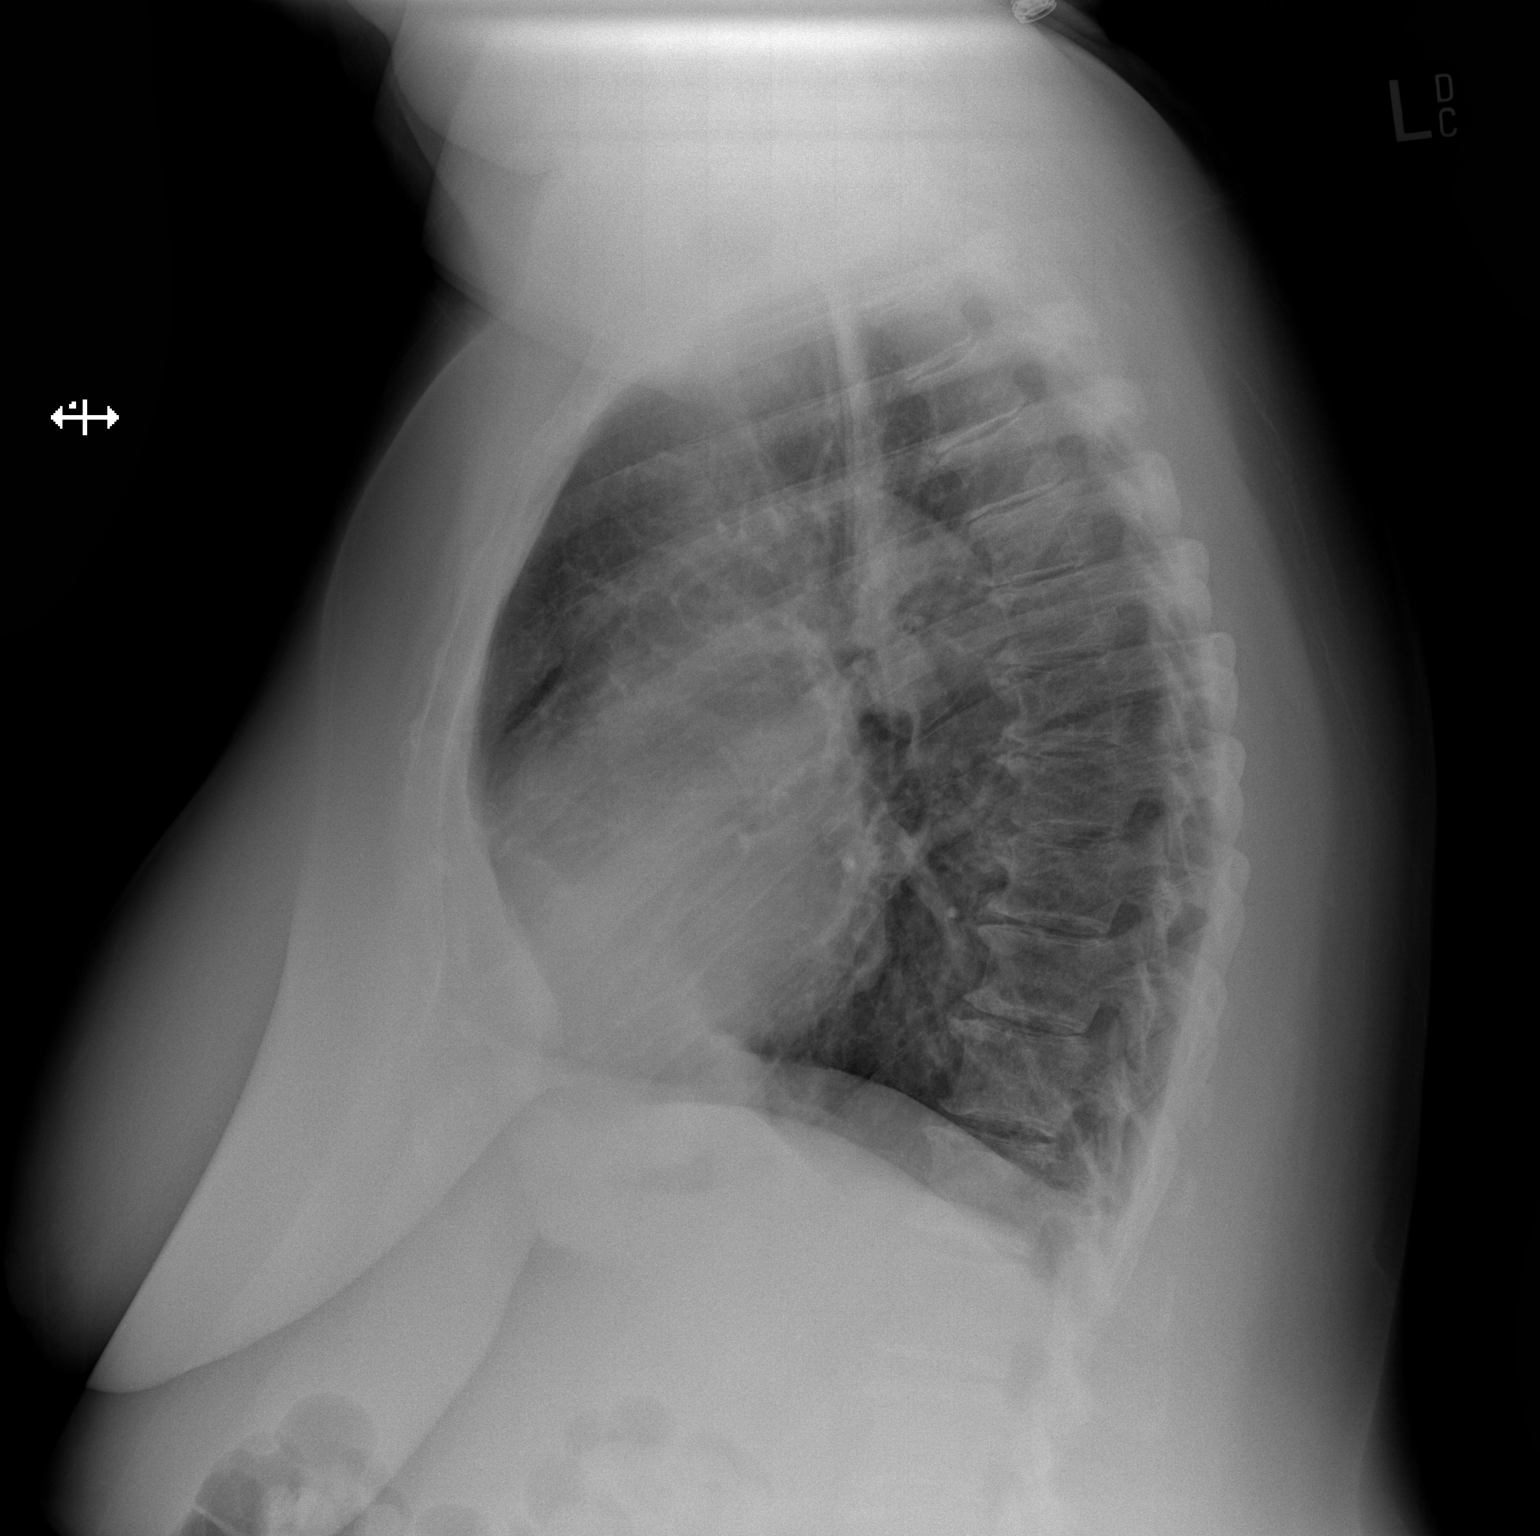

[2 of 2 positions shown; findings below may reference images not displayed]

FINDINGS: Lungs are clear.  No pleural effusion or pneumothorax.

The heart is normal in size.

Degenerative changes of the visualized thoracolumbar spine.
IMPRESSION: No evidence of acute cardiopulmonary disease.
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
## 2017-07-31 ENCOUNTER — Encounter: Payer: Self-pay | Admitting: Family Medicine

## 2019-09-19 ENCOUNTER — Other Ambulatory Visit: Payer: Self-pay | Admitting: Family Medicine

## 2019-09-19 ENCOUNTER — Other Ambulatory Visit (HOSPITAL_COMMUNITY)
Admission: RE | Admit: 2019-09-19 | Discharge: 2019-09-19 | Disposition: A | Discharge status: Home / Self Care | Payer: Medicare Other | Source: Ambulatory Visit | Attending: Family Medicine | Admitting: Family Medicine

## 2019-09-19 DIAGNOSIS — Z124 Encounter for screening for malignant neoplasm of cervix: Secondary | ICD-10-CM | POA: Insufficient documentation

## 2019-09-19 DIAGNOSIS — Z1151 Encounter for screening for human papillomavirus (HPV): Secondary | ICD-10-CM | POA: Insufficient documentation

## 2019-09-23 ENCOUNTER — Other Ambulatory Visit: Payer: Self-pay | Admitting: Family Medicine

## 2019-09-23 DIAGNOSIS — Z1231 Encounter for screening mammogram for malignant neoplasm of breast: Secondary | ICD-10-CM

## 2019-09-23 LAB — CYTOLOGY - PAP
Comment: NEGATIVE
Diagnosis: NEGATIVE
High risk HPV: NEGATIVE

## 2019-10-03 ENCOUNTER — Other Ambulatory Visit: Payer: Self-pay

## 2019-10-03 ENCOUNTER — Ambulatory Visit
Admission: RE | Admit: 2019-10-03 | Discharge: 2019-10-03 | Disposition: A | Discharge status: Home / Self Care | Payer: Medicare Other | Source: Ambulatory Visit | Attending: Family Medicine | Admitting: Family Medicine

## 2019-10-03 DIAGNOSIS — Z1231 Encounter for screening mammogram for malignant neoplasm of breast: Secondary | ICD-10-CM

## 2019-11-18 ENCOUNTER — Ambulatory Visit: Payer: Medicare Other | Admitting: Podiatry

## 2019-11-22 ENCOUNTER — Ambulatory Visit: Payer: Medicare Other | Admitting: Allergy

## 2019-12-12 ENCOUNTER — Ambulatory Visit: Payer: Medicare Other | Admitting: Allergy

## 2019-12-17 ENCOUNTER — Other Ambulatory Visit: Payer: Self-pay

## 2019-12-17 ENCOUNTER — Ambulatory Visit: Payer: Medicare Other | Admitting: Allergy & Immunology

## 2019-12-17 ENCOUNTER — Encounter: Payer: Self-pay | Admitting: Allergy & Immunology

## 2019-12-17 VITALS — BP 130/62 | HR 89 | Temp 98.0°F | Resp 16 | Ht 62.0 in | Wt 221.2 lb

## 2019-12-17 DIAGNOSIS — J302 Other seasonal allergic rhinitis: Secondary | ICD-10-CM

## 2019-12-17 DIAGNOSIS — J4540 Moderate persistent asthma, uncomplicated: Secondary | ICD-10-CM

## 2019-12-17 DIAGNOSIS — J3089 Other allergic rhinitis: Secondary | ICD-10-CM | POA: Diagnosis not present

## 2019-12-17 DIAGNOSIS — T7800XD Anaphylactic reaction due to unspecified food, subsequent encounter: Secondary | ICD-10-CM | POA: Diagnosis not present

## 2019-12-17 NOTE — Progress Notes (Signed)
NEW PATIENT  Date of Service/Encounter:  12/17/19  Referring provider: Kathyrn Lass, MD   Assessment:   Moderate persistent asthma, uncomplicated  Seasonal and perennial allergic rhinitis (grasses, ragweed, weeds, trees, indoor molds, outdoor molds, dust mites, cat, dog and cockroach)  Anaphylactic shock due to food  Plan/Recommendations:    1. Moderate persistent asthma, uncomplicated - Testing looked low, but it did improve with the Xopenex treatments. - We are not going to make changes since you are in this study and we do not want to mess it up. - Spacer use reviewed. - Daily controller medication(s): Dulera 200/66mg two puffs twice daily with spacer - Prior to physical activity: albuterol 2 puffs 10-15 minutes before physical activity. - Rescue medications: albuterol 4 puffs every 4-6 hours as needed - Asthma control goals:  * Full participation in all desired activities (may need albuterol before activity) * Albuterol use two time or less a week on average (not counting use with activity) * Cough interfering with sleep two time or less a month * Oral steroids no more than once a year * No hospitalizations  2. Seasonal and perennial allergic rhinitis - Testing today showed: grasses, ragweed, weeds, trees, indoor molds, outdoor molds, dust mites, cat, dog and cockroach - Copy of test results provided.  - Avoidance measures provided. - Continue with: Xyzal (levocetirizine) 547mtablet once daily and Flonase (fluticasone) two sprays per nostril daily - Start taking: Astelin (azelastine) 2 sprays per nostril 1-2 times daily as needed - You can use an extra dose of the antihistamine, if needed, for breakthrough symptoms.  - Consider nasal saline rinses 1-2 times daily to remove allergens from the nasal cavities as well as help with mucous clearance (this is especially helpful to do before the nasal sprays are given) - Strongly consider allergy shots as a means of long-term  control. - Allergy shots "re-train" and "reset" the immune system to ignore environmental allergens and decrease the resulting immune response to those allergens (sneezing, itchy watery eyes, runny nose, nasal congestion, etc).    - Allergy shots improve symptoms in 75-85% of patients.   3. Anaphylactic shock due to food (catfish, bass, trout, flounder, and cod) - Testing was positive to catfish, bass, trout, flounder, and cod. - Testing was negative to salmon and tuna (ever so slighly reactivie, but not large at all), so ocntinue to eat this since you are eating it now without a problem. - Testing was also negative to tomato and banana. - Anaphylaxis management plan provided. - Wynona Lunaraining provided.   4. Return in about 6 weeks (around 01/28/2020).   Subjective:   Melissa Chavez a [age].o. female presenting today for evaluation of  Chief Complaint  Patient presents with  . Allergic Rhinitis     eyes itch, ears feel like something is in them  . Asthma    Says gotten worse in the past week. She is currently in a asthma research study.    Melissa Chavez a history of the following: Patient Active Problem List   Diagnosis Date Noted  . Unilateral primary osteoarthritis, left knee 09/13/2016  . Unilateral primary osteoarthritis, right knee 09/13/2016  . Pressure injury of skin 03/08/2016  . Acute gout of left knee 03/07/2016  . Sinus congestion 11/29/2015  . Bilateral leg edema 09/25/2015  . Abdominal pain, left upper quadrant 09/25/2015  . 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 Prednisone adverse reaction 08/20/2015  . Abdominal  pain, chronic, epigastric 06/30/2015  . Osteoarthritis of right foot 03/22/2015  . Type 2 diabetes mellitus with diabetic polyneuropathy, without long-term current use of insulin ([place]) 03/20/2015  . BMI 40.0-44.9, adult ([employer]) 06/13/2013  . Overactive bladder 06/13/2013  . Allergic rhinitis 06/13/2013  . Asthma, moderate  persistent 06/13/2013  . BIPOLAR DISORDER UNSPECIFIED 03/18/2008  . PERIPHERAL EDEMA 07/03/2007  . GERD 01/30/2007  . HYPERCHOLESTEROLEMIA 12/07/2006  . Essential hypertension 12/07/2006  . Osteoarthritis of right knee 12/07/2006  . ANEMIA, IRON DEFICIENCY 01/06/2006    History obtained from: chart review and patient.  Melissa Chavez was referred by Kathyrn Lass, MD.     Melissa Chavez is a 59 y.o. female presenting for an evaluation of environmental allergies and asthma. She also has some food allergies.    Asthma/Respiratory Symptom History: She is on Dulera two puffs twice daily with Ventolin as needed. She is also enrolled in some kind of asthma biologic study. She apparently gets $50 per spirometry twice weekly for 3 months and then additional funds for the injection of the drug itself. She has a history of requiring prednisone fairly frequently.   Allergic Rhinitis Symptom History: She was followed by Dr. Shaune Leeks. Her worst time of the year is the spring time. The pollen was so bad this year and she could not take her contacts in. She did not want to come in during the pandemic. It seemed to get better. She is currently on levocetirizine 5 mg daily as well as Flonase which she does not use like she should. She also reports that she is having some itching of her ears. It is cleared up now. She does not know whether her airpods were messing up her ears.    Food Allergy Symptom History: She reports that does react to only certain fin fish. She is able to eat tuna and salmon without a problem, however. Otherwise she develops throat swelling to them. She does eat shellfish without a problem. She does not have an up to date epinephrine autoinjector.   Otherwise, there is no history of other atopic diseases, including drug allergies, stinging insect allergies, eczema, urticaria or contact dermatitis. There is no significant infectious history. Vaccinations are up to date.    Past Medical  History: Patient Active Problem List   Diagnosis Date Noted  . Unilateral primary osteoarthritis, left knee 09/13/2016  . Unilateral primary osteoarthritis, right knee 09/13/2016  . Pressure injury of skin 03/08/2016  . Acute gout of left knee 03/07/2016  . Sinus congestion 11/29/2015  . Bilateral leg edema 09/25/2015  . Abdominal pain, left upper quadrant 09/25/2015  . 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 Prednisone adverse reaction 08/20/2015  . Abdominal pain, chronic, epigastric 06/30/2015  . Osteoarthritis of right foot 03/22/2015  . Type 2 diabetes mellitus with diabetic polyneuropathy, without long-term current use of insulin ([place]) 03/20/2015  . BMI 40.0-44.9, adult ([place]) 06/13/2013  . Overactive bladder 06/13/2013  . Allergic rhinitis 06/13/2013  . Asthma, moderate persistent 06/13/2013  . BIPOLAR DISORDER UNSPECIFIED 03/18/2008  . PERIPHERAL EDEMA 07/03/2007  . GERD 01/30/2007  . HYPERCHOLESTEROLEMIA 12/07/2006  . Essential hypertension 12/07/2006  . Osteoarthritis of right knee 12/07/2006  . ANEMIA, IRON DEFICIENCY 01/06/2006    Medication List:  Allergies as of 12/17/2019      Reactions   Fish Allergy Anaphylaxis   Banana Other (See Comments)   unknown   Other    Cockroaches, rag weed, pollen,- showed up in allergy test unknown  reaction   Prednisone Other (See Comments)   Anxiety, erratic behavior, ?hallucinations Requiring ED and psychiatric evaluation      Medication List       Accurate as of December 17, 2019  6:21 PM. If you have any questions, ask your nurse or doctor.        STOP taking these medications   Advair Diskus 250-50 MCG/DOSE Aepb Generic drug: Fluticasone-Salmeterol Stopped by: Valentina Shaggy, MD   cetirizine 10 MG tablet Commonly known as: ZyrTEC Allergy Stopped by: Valentina Shaggy, MD   colchicine 0.6 MG tablet Stopped by: Valentina Shaggy, MD   diclofenac sodium 1 % Gel Commonly known as:  VOLTAREN Stopped by: Valentina Shaggy, MD   hydrOXYzine 25 MG tablet Commonly known as: ATARAX/VISTARIL Stopped by: Valentina Shaggy, MD   pravastatin 40 MG tablet Commonly known as: PRAVACHOL Stopped by: Valentina Shaggy, MD   traMADol 50 MG tablet Commonly known as: ULTRAM Stopped by: Valentina Shaggy, MD     TAKE these medications   Accu-Chek Aviva Plus w/Device Kit TEST BLOOD SUGAR ONCE  DAILY.   Accu-Chek Aviva Soln Test blood sugar once daily. Dx: E11.42   Accu-Chek Softclix Lancets lancets TEST BLOOD SUGAR ONCE DAILY   B-12 1000 MCG Caps Take 2 capsules by mouth daily.   blood glucose meter kit and supplies Kit Use as directed 4 times a day.  DX: R73.09   CENTRUM SILVER 50+WOMEN PO Take 1 tablet by mouth daily.   enalapril 10 MG tablet Commonly known as: VASOTEC Take 10 mg by mouth daily.   febuxostat 40 MG tablet Commonly known as: ULORIC Take 40 mg by mouth daily.   fluticasone 50 MCG/ACT nasal spray Commonly known as: FLONASE PLACE 2 SPRAYS INTO BOTH NOSTRILS AT BEDTIME.   glimepiride 4 MG tablet Commonly known as: AMARYL Take 4 mg by mouth daily with breakfast.   glucose blood test strip Commonly known as: Accu-Chek Aviva Plus Test blood sugar once daily. Dx: E11.42   levocetirizine 5 MG tablet Commonly known as: XYZAL Take 5 mg by mouth daily.   metFORMIN 500 MG 24 hr tablet Commonly known as: GLUCOPHAGE-XR Take 1,000 mg by mouth at bedtime.   methocarbamol 750 MG tablet Commonly known as: ROBAXIN Take 750 mg by mouth as needed for muscle spasms.   mometasone-formoterol 100-5 MCG/ACT Aero Commonly known as: DULERA Inhale 2 puffs into the lungs 2 (two) times daily.   oxybutynin 15 MG 24 hr tablet Commonly known as: DITROPAN XL TAKE 1 TABLET BY MOUTH AT  BEDTIME   PARoxetine 10 MG tablet Commonly known as: PAXIL Take 10 mg by mouth daily.   Pepcid 40 MG tablet Generic drug: famotidine Take 40 mg by mouth 2  (two) times daily.   rosuvastatin 10 MG tablet Commonly known as: CRESTOR Take 10 mg by mouth daily.   triamterene-hydrochlorothiazide 37.5-25 MG tablet Commonly known as: MAXZIDE-25 Take 1 tablet by mouth daily.   Ventolin HFA 108 (90 Base) MCG/ACT inhaler Generic drug: albuterol USE 2 PUFFS EVERY 6 HOURS  AS NEEDED FOR WHEEZING       Birth History: non-contributory  Developmental History: non-contributory  Past Surgical History: Past Surgical History:  Procedure Laterality Date  . deviated septum surgery x 2     . HERNIA REPAIR    . TONSILLECTOMY     Pt denies  . TUBAL LIGATION       Family History: Family History  Problem Relation Age of  Onset  . Hypertension Mother   . Diabetes Mother   . Hyperlipidemia Mother   . Stroke Mother   . Heart disease Mother   . Pulmonary embolism Mother   . Hypertension Father   . Diabetes Father   . Hyperlipidemia Father      Social History: Maryori lives at home with her family.  They live in an apartment with carpeting throughout the home.  There is electric heating and central cooling.  There are no animals inside or outside of the home.  She does have dust mite covers on her bed, but not her pillows.  There is no tobacco exposure.  She is currently retired.  There is no fume, chemical, or dust exposure.  She does not use a HEPA filter in the home.  [place].   Review of Systems  Constitutional: Negative.  Negative for fever, malaise/fatigue and weight loss.  HENT: Positive for congestion. Negative for ear discharge, ear pain and sinus pain.        Positive for postnasal drip. Positive for rhinorrhea.   Eyes: Negative for pain, discharge and redness.  Respiratory: Negative for cough, sputum production, shortness of breath and wheezing.   Cardiovascular: Negative.  Negative for chest pain and palpitations.  Gastrointestinal: Negative for abdominal pain, constipation, diarrhea,  heartburn, nausea and vomiting.  Skin: Negative.  Negative for itching and rash.  Neurological: Negative for dizziness and headaches.  Endo/Heme/Allergies: Negative for environmental allergies. Does not bruise/bleed easily.       Objective:   Blood pressure 130/62, pulse 89, temperature 98 F (36.7 C), resp. rate 16, height _0  (1.575 m), weight 221 lb 3.2 oz (100.3 kg), last menstrual period 05/30/2013, SpO2 99 %. Body mass index is 40.46 kg/m.   Physical Exam:   Physical Exam Constitutional:      Appearance: She is well-developed.  HENT:     Head: Normocephalic and atraumatic.     Right Ear: Tympanic membrane, ear canal and external ear normal. No drainage, swelling or tenderness. Tympanic membrane is not injected, scarred, erythematous, retracted or bulging.     Left Ear: Tympanic membrane, ear canal and external ear normal. No drainage, swelling or tenderness. Tympanic membrane is not injected, scarred, erythematous, retracted or bulging.     Nose: No nasal deformity, septal deviation, mucosal edema or rhinorrhea.     Right Turbinates: Enlarged and swollen.     Left Turbinates: Enlarged and swollen.     Right Sinus: No maxillary sinus tenderness or frontal sinus tenderness.     Left Sinus: No maxillary sinus tenderness or frontal sinus tenderness.     Comments: There is cobblestoning in the posterior oropharynx.     Mouth/Throat:     Mouth: Mucous membranes are not pale and not dry.     Pharynx: Uvula midline.  Eyes:     General:        Right eye: No discharge.        Left eye: No discharge.     Conjunctiva/sclera: Conjunctivae normal.     Right eye: Right conjunctiva is not injected. No chemosis.    Left eye: Left conjunctiva is not injected. No chemosis.    Pupils: Pupils are equal, round, and reactive to light.  Cardiovascular:     Rate and Rhythm: Normal rate and regular rhythm.     Heart sounds: Normal heart sounds.  Pulmonary:     Effort: Pulmonary effort is  normal. No  tachypnea, accessory muscle usage or respiratory distress.     Breath sounds: Normal breath sounds. No wheezing, rhonchi or rales.     Comments: Moving air well in all lung fields. No increased work of breathing noted.  Chest:     Chest wall: No tenderness.  Abdominal:     Tenderness: There is no abdominal tenderness. There is no guarding or rebound.  Lymphadenopathy:     Head:     Right side of head: No submandibular, tonsillar or occipital adenopathy.     Left side of head: No submandibular, tonsillar or occipital adenopathy.     Cervical: No cervical adenopathy.  Skin:    Coloration: Skin is not pale.     Findings: No abrasion, erythema, petechiae or rash. Rash is not papular, urticarial or vesicular.  Neurological:     Mental Status: She is alert.  Psychiatric:        Behavior: Behavior is cooperative.      Diagnostic studies:    Spirometry: results abnormal (FEV1: 0.92/48%, FVC: 1.52/62%, FEV1/FVC: 61%).    Spirometry consistent with mixed obstructive and restrictive disease. Xopenex four puffs via MDI treatment given in clinic with improvement in FEV1 and FVC, but not significant per ATS criteria.  Allergy Studies:     Airborne Adult Perc - 12/17/19 1543    Time Antigen Placed 1543    Allergen Manufacturer Lavella Hammock    Location Back    Number of Test 59    Panel 1 Select    1. Control-Buffer 50% Glycerol Negative    2. Control-Histamine 1 mg/ml 2+    3. Albumin saline Negative    4. [place] Negative    5. [place] 2+    6. [place] Negative    7. [place] Blue 2+    8. Meadow Fescue Negative    9. Perennial Rye 2+    10. Sweet Vernal Negative    11. Timothy 2+    12. Cocklebur Negative    13. Burweed Mars[place]er Negative    14. Ragweed, short 4+    15. Ragweed, Giant 3+    16. Plantain,  English 2+    17. Lamb's Quarters 2+    18. Sheep [place] 2+    19. Rough Pigweed 2+    20. Marsh Elder, Rough Negative    21. Mugwort, Common 2+    22. Ash mix  Negative    23. Birch mix 3+    24. Beech American 3+    25. Box, Elder 3+    26. Cedar, red Negative    27. Cottonwood, Eastern 2+    28. Elm mix 2+    29. Hickory Negative    30. Maple mix Negative    31. Oak, [place] mix 3+    32. Pecan Pollen 4+    33. Pine mix Negative    34. Sycamore Eastern Negative    35. [place], Black Pollen 4+    36. Alternaria alternata Negative    37. Cladosporium Herbarum 2+    38. Aspergillus mix 2+    39. Penicillium mix 2+    40. Bipolaris sorokiniana (Helminthosporium) Negative    41. Drechslera spicifera (Curvularia) 3+    42. Mucor plumbeus 3+    43. Fusarium moniliforme 3+    44. Aureobasidium pullulans (pullulara) 3+    45. Rhizopus oryzae Negative    46. Botrytis cinera Negative    47. Epicoccum nigrum 2+    48. Phoma betae Negative  49. Candida Albicans 2+    50. Trichophyton mentagrophytes Negative    51. Mite, D Farinae  5,000 AU/ml 3+    52. Mite, D Pteronyssinus  5,000 AU/ml 4+    53. Cat Hair 10,000 BAU/ml 2+    54.  Dog Epithelia 2+    55. Mixed Feathers Negative    56. Horse Epithelia 2+    57. Cockroach, German 4+    58. Mouse Negative    59. Tobacco Leaf Negative          Food Adult Perc - 12/17/19 1500    Time Antigen Placed 1544    Allergen Manufacturer Lavella Hammock    Location Back    Number of allergen test 10    Control-Histamine 1 mg/ml 2+    18. Catfish 4+    19. Bass 4+    20. Trout 4+    21. Tuna --   +/-   22. Salmon --   +/-   23. Flounder 3+    24. Codfish 3+    42. Tomato Negative    57. Banana Negative           Allergy testing results were read and interpreted by myself, documented by clinical staff.         Salvatore Marvel, MD [facility]

## 2019-12-17 NOTE — Patient Instructions (Addendum)
1. Moderate persistent asthma, uncomplicated - Testing looked low, but it did improve with the Xopenex treatments. - We are not going to make changes since you are in this study and we do not want to mess it up. - Spacer use reviewed. - Daily controller medication(s): Dulera 200/71mcg two puffs twice daily with spacer - Prior to physical activity: albuterol 2 puffs 10-15 minutes before physical activity. - Rescue medications: albuterol 4 puffs every 4-6 hours as needed - Asthma control goals:  * Full participation in all desired activities (may need albuterol before activity) * Albuterol use two time or less a week on average (not counting use with activity) * Cough interfering with sleep two time or less a month * Oral steroids no more than once a year * No hospitalizations  2. Seasonal and perennial allergic rhinitis - Testing today showed: grasses, ragweed, weeds, trees, indoor molds, outdoor molds, dust mites, cat, dog and cockroach - Copy of test results provided.  - Avoidance measures provided. - Continue with: Xyzal (levocetirizine) 5mg  tablet once daily and Flonase (fluticasone) two sprays per nostril daily - Start taking: Astelin (azelastine) 2 sprays per nostril 1-2 times daily as needed - You can use an extra dose of the antihistamine, if needed, for breakthrough symptoms.  - Consider nasal saline rinses 1-2 times daily to remove allergens from the nasal cavities as well as help with mucous clearance (this is especially helpful to do before the nasal sprays are given) - Strongly consider allergy shots as a means of long-term control. - Allergy shots "re-train" and "reset" the immune system to ignore environmental allergens and decrease the resulting immune response to those allergens (sneezing, itchy watery eyes, runny nose, nasal congestion, etc).    - Allergy shots improve symptoms in 75-85% of patients.   3. 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 - Testing was negative to salmon and tuna (ever so slighly reactivie, but not large at all), so ocntinue to eat this since you are eating it now without a problem. - Testing was also negative to tomato and banana. - Anaphylaxis management plan provided. 05-25-1995 training provided.   4. Return in about 6 weeks (around 01/28/2020).    Please inform 01/30/2020 of any Emergency Department visits, hospitalizations, or changes in symptoms. Call us before going to the ED for breathing or allergy symptoms since we might be able to fit you in for a sick visit. Feel free to contact us anytime with any questions, problems, or concerns.  It was a pleasure to meet you today!  Websites that have reliable patient information: 1. American Academy of Asthma, Allergy, and Immunology: www.aaaai.org 2. Food Allergy Research and Education (FARE): foodallergy.org 3. Mothers of Asthmatics: http://www.asthmacommunitynetwork.org 4. American College of Allergy, Asthma, and Immunology: www.acaai.org   COVID-19 Vaccine Information can be found at: [place] For questions related to vaccine distribution or appointments, please email [email or web address] or call [phone].     "Like" [phone] on Facebook and Instagram for our latest updates!     HAPPY FALL!     Make sure you are registered to vote! If you have moved or changed any of your contact information, you will need to get this updated before voting!  In some cases, you MAY be able to register to vote online: [place]     Reducing Pollen Exposure  The American Academy of Allergy, Asthma and Immunology suggests the following  steps to reduce your exposure to pollen during allergy seasons.    1. Do not hang sheets or clothing out to dry; pollen may collect on these items. 2. Do not  mow lawns or spend time around freshly cut grass; mowing stirs up pollen. 3. Keep windows closed at night.  Keep car windows closed while driving. 4. Minimize morning activities outdoors, a time when pollen counts are usually at their highest. 5. Stay indoors as much as possible when pollen counts or humidity is high and on windy days when pollen tends to remain in the air longer. 6. Use air conditioning when possible.  Many air conditioners have filters that trap the pollen spores. 7. Use a HEPA room air filter to remove pollen form the indoor air you breathe.  Control of Mold Allergen   Mold and fungi can grow on a variety of surfaces provided certain temperature and moisture conditions exist.  Outdoor molds grow on plants, decaying vegetation and soil.  The major outdoor mold, Alternaria and Cladosporium, are found in very high numbers during hot and dry conditions.  Generally, a late Summer - Fall peak is seen for common outdoor fungal spores.  Rain will temporarily lower outdoor mold spore count, but counts rise rapidly when the rainy period ends.  The most important indoor molds are Aspergillus and Penicillium.  Dark, humid and poorly ventilated basements are ideal sites for mold growth.  The next most common sites of mold growth are the bathroom and the kitchen.  Outdoor (Seasonal) Mold Control  Positive outdoor molds via skin testing: Cladosporium, Drechslera (Curvalaria), Mucor and Epicoccum  1. Use air conditioning and keep windows closed 2. Avoid exposure to decaying vegetation. 3. Avoid leaf raking. 4. Avoid grain handling. 5. Consider wearing a face mask if working in moldy areas.  6.   Indoor (Perennial) Mold Control   Positive indoor molds via skin testing: Aspergillus, Penicillium, Fusarium, Aureobasidium (Pullulara) and Candida  1. Maintain humidity below 50%. 2. Clean washable surfaces with 5% bleach solution. 3. Remove sources e.g. contaminated carpets.     Control  of Dog or Cat Allergen  Avoidance is the best way to manage a dog or cat allergy. If you have a dog or cat and are allergic to dog or cats, consider removing the dog or cat from the home. If you have a dog or cat but don't want to find it a new home, or if your family wants a pet even though someone in the household is allergic, here are some strategies that may help keep symptoms at bay:  1. Keep the pet out of your bedroom and restrict it to only a few rooms. Be advised that keeping the dog or cat in only one room will not limit the allergens to that room. 2. Don't pet, hug or kiss the dog or cat; if you do, wash your hands with soap and water. 3. High-efficiency particulate air (HEPA) cleaners run continuously in a bedroom or living room can reduce allergen levels over time. 4. Regular use of a high-efficiency vacuum cleaner or a central vacuum can reduce allergen levels. 5. Giving your dog or cat a bath at least once a week can reduce airborne allergen.  Control of Dust Mite Allergen    Dust mites play a major role in allergic asthma and rhinitis.  They occur in environments with high humidity wherever human skin is found.  Dust mites absorb humidity from the atmosphere (ie, they do not drink) and feed  on organic matter (including shed human and animal skin).  Dust mites are a microscopic type of insect that you cannot see with the naked eye.  High levels of dust mites have been detected from mattresses, pillows, carpets, upholstered furniture, bed covers, clothes, soft toys and any woven material.  The principal allergen of the dust mite is found in its feces.  A gram of dust may contain 1,000 mites and 250,000 fecal particles.  Mite antigen is easily measured in the air during house cleaning activities.  Dust mites do not bite and do not cause harm to humans, other than by triggering allergies/asthma.    Ways to decrease your exposure to dust mites in your home:  1. Encase mattresses, box  springs and pillows with a mite-impermeable barrier or cover   2. Wash sheets, blankets and drapes weekly in hot water (130 F) with detergent and dry them in a dryer on the hot setting.  3. Have the room cleaned frequently with a vacuum cleaner and a damp dust-mop.  For carpeting or rugs, vacuuming with a vacuum cleaner equipped with a high-efficiency particulate air (HEPA) filter.  The dust mite allergic individual should not be in a room which is being cleaned and should wait 1 hour after cleaning before going into the room. 4. Do not sleep on upholstered furniture (eg, couches).   5. If possible removing carpeting, upholstered furniture and drapery from the home is ideal.  Horizontal blinds should be eliminated in the rooms where the person spends the most time (bedroom, study, television room).  Washable vinyl, roller-type shades are optimal. 6. Remove all non-washable stuffed toys from the bedroom.  Wash stuffed toys weekly like sheets and blankets above.   7. Reduce indoor humidity to less than 50%.  Inexpensive humidity monitors can be purchased at most hardware stores.  Do not use a humidifier as can make the problem worse and are not recommended.  Control of Cockroach Allergen  Cockroach allergen has been identified as an important cause of acute attacks of asthma, especially in urban settings.  There are fifty-five species of cockroach that exist in the [place], however only three, the [place], [place] species produce allergen that can affect patients with Asthma.  Allergens can be obtained from fecal particles, egg casings and secretions from cockroaches.    1. Remove food sources. 2. Reduce access to water. 3. Seal access and entry points. 4. Spray runways with 0.5-1% Diazinon or Chlorpyrifos 5. Blow boric acid power under stoves and refrigerator. 6. Place bait stations (hydramethylnon) at feeding sites.  Allergy Shots   Allergies are the result of a chain  reaction that starts in the immune system. Your immune system controls how your body defends itself. For instance, if you have an allergy to pollen, your immune system identifies pollen as an invader or allergen. Your immune system overreacts by producing antibodies called Immunoglobulin E (IgE). These antibodies travel to cells that release chemicals, causing an allergic reaction.  The concept behind allergy immunotherapy, whether it is received in the form of shots or tablets, is that the immune system can be desensitized to specific allergens that trigger allergy symptoms. Although it requires time and patience, the payback can be long-term relief.  How Do Allergy Shots Work?  Allergy shots work much like a vaccine. Your body responds to injected amounts of a particular allergen given in increasing doses, eventually developing a resistance and tolerance to it. Allergy shots can lead to decreased, minimal  or no allergy symptoms.  There generally are two phases: build-up and maintenance. Build-up often ranges from three to six months and involves receiving injections with increasing amounts of the allergens. The shots are typically given once or twice a week, though more rapid build-up schedules are sometimes used.  The maintenance phase begins when the most effective dose is reached. This dose is different for each person, depending on how allergic you are and your response to the build-up injections. Once the maintenance dose is reached, there are longer periods between injections, typically two to four weeks.  Occasionally doctors give cortisone-type shots that can temporarily reduce allergy symptoms. These types of shots are different and should not be confused with allergy immunotherapy shots.  Who Can Be Treated with Allergy Shots?  Allergy shots may be a good treatment approach for people with allergic rhinitis (hay fever), allergic asthma, conjunctivitis (eye allergy) or stinging insect  allergy.   Before deciding to begin allergy shots, you should consider:  . The length of allergy season and the severity of your symptoms . Whether medications and/or changes to your environment can control your symptoms . Your desire to avoid long-term medication use . Time: allergy immunotherapy requires a major time commitment . Cost: may vary depending on your insurance coverage  Allergy shots for children age 10 and older are effective and often well tolerated. They might prevent the onset of new allergen sensitivities or the progression to asthma.  Allergy shots are not started on patients who are pregnant but can be continued on patients who become pregnant while receiving them. In some patients with other medical conditions or who take certain common medications, allergy shots may be of risk. It is important to mention other medications you talk to your allergist.   When Will I Feel Better?  Some may experience decreased allergy symptoms during the build-up phase. For others, it may take as long as 12 months on the maintenance dose. If there is no improvement after a year of maintenance, your allergist will discuss other treatment options with you.  If you aren't responding to allergy shots, it may be because there is not enough dose of the allergen in your vaccine or there are missing allergens that were not identified during your allergy testing. Other reasons could be that there are high levels of the allergen in your environment or major exposure to non-allergic triggers like tobacco smoke.  What Is the Length of Treatment?  Once the maintenance dose is reached, allergy shots are generally continued for three to five years. The decision to stop should be discussed with your allergist at that time. Some people may experience a permanent reduction of allergy symptoms. Others may relapse and a longer course of allergy shots can be considered.  What Are the Possible  Reactions?  The two types of adverse reactions that can occur with allergy shots are local and systemic. Common local reactions include very mild redness and swelling at the injection site, which can happen immediately or several hours after. A systemic reaction, which is less common, affects the entire body or a particular body system. They are usually mild and typically respond quickly to medications. Signs include increased allergy symptoms such as sneezing, a stuffy nose or hives.  Rarely, a serious systemic reaction called anaphylaxis can develop. Symptoms include swelling in the throat, wheezing, a feeling of tightness in the chest, nausea or dizziness. Most serious systemic reactions develop within 30 minutes of allergy shots. This is why  it is strongly recommended you wait in your doctor's office for 30 minutes after your injections. Your allergist is trained to watch for reactions, and his or her staff is trained and equipped with the proper medications to identify and treat them.  Who Should Administer Allergy Shots?  The preferred location for receiving shots is your prescribing allergist's office. Injections can sometimes be given at another facility where the physician and staff are trained to recognize and treat reactions, and have received instructions by your prescribing allergist.

## 2019-12-18 ENCOUNTER — Telehealth: Payer: Self-pay | Admitting: Allergy & Immunology

## 2019-12-18 ENCOUNTER — Encounter: Payer: Self-pay | Admitting: Allergy & Immunology

## 2019-12-18 NOTE — Telephone Encounter (Signed)
Patient called and would like for you to send the record  [facility]. Asthma research study that she is in. Fax [phone]. If you have any question she said to call her [phone].

## 2019-12-19 NOTE — Telephone Encounter (Signed)
Thank you :)

## 2020-01-06 ENCOUNTER — Telehealth: Payer: Self-pay

## 2020-01-06 NOTE — Telephone Encounter (Signed)
Patient called and states that her head is hurting constantly, she's having congestion, and neck and body aches.  Patient wants to know what humidifier and or vapor rub is best to use. Patient also states that she is using her inhaler as needed as well. Please advise.

## 2020-01-07 MED ORDER — PREDNISONE 10 MG PO TABS: ORAL_TABLET | ORAL | 0 refills | Status: DC

## 2020-01-07 NOTE — Telephone Encounter (Signed)
Called and left message for her to call our office back to discuss Dr. Ellouise Newer note

## 2020-01-07 NOTE — Telephone Encounter (Signed)
I do not think she should use a humidifier on a routine basis since she has a dust mite allergy.  Humidity makes dust mites thrive.  She can certainly use it for a few days at a time if needed.  Regarding her congestion, can you make sure she is on the Flonase and the Astelin?  We could send in a prednisone burst if the patient is interested in it. I send in a prednisone prescription in case she wants to pick this up.  Can you make sure which inhaler she is using as needed?  She should be using 1 every day, twice a day in fact.  I would strongly consider getting COVID19 tested.  Malachi Bonds, MD [facility] [place]

## 2020-01-08 ENCOUNTER — Telehealth: Payer: Self-pay

## 2020-01-08 ENCOUNTER — Other Ambulatory Visit: Payer: Self-pay

## 2020-01-08 MED ORDER — AZELASTINE HCL 0.1 % NA SOLN: 2.0000 | Freq: Two times a day (BID) | NASAL | 5 refills | Status: DC

## 2020-01-08 NOTE — Telephone Encounter (Signed)
Patient called and said she was not taking the Astelin nasal spray due to her not being aware and nasal spray not being sent in. I have sent in the Astelin nasal spray to patient preference. Patient verbalized understanding and agrees to get covid tested soon. Patient also stated that she will not take the prednisone due to her allergy of it.

## 2020-01-09 MED ORDER — DEXAMETHASONE 4 MG PO TABS: 12.0000 mg | ORAL_TABLET | Freq: Once | ORAL | 0 refills | Status: AC

## 2020-01-09 NOTE — Addendum Note (Signed)
Addended by: Alfonse Spruce on: 01/09/2020 12:50 PM   Modules accepted: Orders

## 2020-01-09 NOTE — Telephone Encounter (Signed)
Sent in Decadron instead. It might cause irritability/erratic behavior as well, but sometimes it is better tolerated than prednisone.   Malachi Bonds, MD [facility] [place]

## 2020-01-09 NOTE — Telephone Encounter (Signed)
Called patient and she stated that the behavior change is sort of what the prednisone does to her as well. Patient says she will try the new medication.

## 2020-01-09 NOTE — Telephone Encounter (Signed)
Patient called back stating that she is allergic to prednisone and cannot take it. Patient said please send in another medication.

## 2020-01-09 NOTE — Telephone Encounter (Signed)
Left voicemail for patient to return call.

## 2020-01-14 NOTE — Telephone Encounter (Signed)
Sounds good. Thank you for the update. If she is not feeling better by tomorrow, have her call us and we can send in antibiotics.  Malachi Bonds, MD [facility]

## 2020-01-14 NOTE — Telephone Encounter (Signed)
Left a detailed message informing patient of Dr. Gallagher's recommendation. 

## 2020-01-14 NOTE — Telephone Encounter (Signed)
Patient called stating her COVID, FLU and Strep were NEGATIVE.

## 2020-01-15 NOTE — Telephone Encounter (Signed)
Please advise 

## 2020-01-15 NOTE — Telephone Encounter (Signed)
Patient states her PCP put her on antibiotics, which she started on Monday. Patient is still having sinus headaches.  Please advise.

## 2020-01-16 ENCOUNTER — Telehealth: Payer: Self-pay | Admitting: Allergy & Immunology

## 2020-01-16 DIAGNOSIS — H9201 Otalgia, right ear: Secondary | ICD-10-CM

## 2020-01-16 MED ORDER — DOXYCYCLINE MONOHYDRATE 100 MG PO TABS: 100.0000 mg | ORAL_TABLET | Freq: Two times a day (BID) | ORAL | 0 refills | Status: AC

## 2020-01-16 NOTE — Telephone Encounter (Signed)
Dr. Gallagher please advise.  

## 2020-01-16 NOTE — Telephone Encounter (Signed)
I think we need to get her into ENT. Can you ask what antibiotic her PCP put her on, as we might need to broaden it?  Malachi Bonds, MD [facility] [place]

## 2020-01-16 NOTE — Addendum Note (Signed)
Addended by: Alfonse Spruce on: 01/16/2020 05:37 PM   Modules accepted: Orders

## 2020-01-16 NOTE — Telephone Encounter (Signed)
Lets add on doxycycline 100mg  BID for seven days.   We are also going to refer to ENT for otalgia. Referral placed and routed to [facility].  [facility], MD [facility] [place]

## 2020-01-16 NOTE — Telephone Encounter (Signed)
Called and spoke to patient she says her PCP put her on Amox-clav 875 mg tablets which she is to take twice a day. She is also supposed to take Mucinex and Zyrtec along with the medication. Patient says she takes the medication and falls asleep but wakes up with the same problem and it's only on her right ear. Patient says she rarely has relief.

## 2020-01-16 NOTE — Telephone Encounter (Signed)
Patient called and talk to the dr and was told to call back if she was not feeling any better. She said her right ear was really painful. [phone].

## 2020-01-16 NOTE — Telephone Encounter (Signed)
Called patient but received no answer.  

## 2020-01-17 NOTE — Telephone Encounter (Signed)
Pt informed and stated understanding

## 2020-01-22 NOTE — Telephone Encounter (Signed)
Referral has been placed to Dr Suszanne Conners for review and scheduling.  I left a voicemail for the patient with this information. But the voicemail cut me off.  I will try calling the patient tomorrow.  Dr Newman Pies P: [phone]

## 2020-01-23 NOTE — Telephone Encounter (Signed)
Thanks, Dee!!   Greysen Swanton, MD [facility] [place]  

## 2020-02-13 ENCOUNTER — Ambulatory Visit (INDEPENDENT_AMBULATORY_CARE_PROVIDER_SITE_OTHER): Payer: Medicare Other | Admitting: Allergy & Immunology

## 2020-02-13 ENCOUNTER — Encounter: Payer: Self-pay | Admitting: Allergy & Immunology

## 2020-02-13 ENCOUNTER — Other Ambulatory Visit: Payer: Self-pay

## 2020-02-13 VITALS — BP 124/82 | HR 100 | Temp 97.7°F | Resp 16

## 2020-02-13 DIAGNOSIS — J4540 Moderate persistent asthma, uncomplicated: Secondary | ICD-10-CM

## 2020-02-13 DIAGNOSIS — J3089 Other allergic rhinitis: Secondary | ICD-10-CM | POA: Diagnosis not present

## 2020-02-13 DIAGNOSIS — R899 Unspecified abnormal finding in specimens from other organs, systems and tissues: Secondary | ICD-10-CM

## 2020-02-13 DIAGNOSIS — T7800XD Anaphylactic reaction due to unspecified food, subsequent encounter: Secondary | ICD-10-CM

## 2020-02-13 DIAGNOSIS — J302 Other seasonal allergic rhinitis: Secondary | ICD-10-CM

## 2020-02-13 NOTE — Progress Notes (Signed)
FOLLOW UP  Date of Service/Encounter:  02/13/20   Assessment:   Moderate persistent asthma, uncomplicated  Seasonal and perennial allergic rhinitis (grasses, ragweed, weeds, trees, indoor molds, outdoor molds, dust mites, cat, dog and cockroach)  Anaphylactic shock due to food (catfish, bass, trout, flounder, and cod)    Plan/Recommendations:   1. Moderate persistent asthma, uncomplicated - Testing looked better than last time, but your FEV1 is still not great (56%). - we are going to get some labs to see if you qualify for one of our biologic medications (Xolair, Eliberto Ivory, or Dupixent). - Your labs will determine which medication we go with.  - Daily controller medication(s): Dulera 200/66mcg two puffs twice daily with spacer - Prior to physical activity: albuterol 2 puffs 10-15 minutes before physical activity. - Rescue medications: albuterol 4 puffs every 4-6 hours as needed - Asthma control goals:  * Full participation in all desired activities (may need albuterol before activity) * Albuterol use two time or less a week on average (not counting use with activity) * Cough interfering with sleep two time or less a month * Oral steroids no more than once a year * No hospitalizations  2. Seasonal and perennial allergic rhinitis (grasses, ragweed, weeds, trees, indoor molds, outdoor molds, dust mites, cat, dog and cockroach) - Continue with: Xyzal (levocetirizine) 5mg  tablet once daily and Flonase (fluticasone) two sprays per nostril daily and Astelin (azelastine) 2 sprays per nostril 1-2 times daily as needed - Strongly consider nasal saline rinses 1-2 times daily to remove allergens from the nasal cavities as well as help with mucous clearance (this is especially helpful to do before the nasal sprays are given) - Strongly consider allergy shots as a means of long-term control. - Allergy shots "re-train" and "reset" the immune system to ignore environmental allergens  and decrease the resulting immune response to those allergens (sneezing, itchy watery eyes, runny nose, nasal congestion, etc).    - Allergy shots improve symptoms in 75-85% of patients.   3. Anaphylactic shock due to food (catfish, bass, trout, flounder, and cod)  - Continue to avoid triggering foods. 05-25-1995 training provided.   4. Return in about 6 weeks (around 03/26/2020).  Subjective:   Melissa Chavez is a 59 y.o. female presenting today for follow up of  Chief Complaint  Patient presents with  . Asthma    Melissa Chavez Test has a history of the following: Patient Active Problem List   Diagnosis Date Noted  . Unilateral primary osteoarthritis, left knee 09/13/2016  . Unilateral primary osteoarthritis, right knee 09/13/2016  . Pressure injury of skin 03/08/2016  . Acute gout of left knee 03/07/2016  . Sinus congestion 11/29/2015  . Bilateral leg edema 09/25/2015  . Abdominal pain, left upper quadrant 09/25/2015  . 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 Prednisone adverse reaction 08/20/2015  . Abdominal pain, chronic, epigastric 06/30/2015  . Osteoarthritis of right foot 03/22/2015  . Type 2 diabetes mellitus with diabetic polyneuropathy, without long-term current use of insulin (HCC) 03/20/2015  . BMI 40.0-44.9, adult (HCC) 06/13/2013  . Overactive bladder 06/13/2013  . Allergic rhinitis 06/13/2013  . Asthma, moderate persistent 06/13/2013  . BIPOLAR DISORDER UNSPECIFIED 03/18/2008  . PERIPHERAL EDEMA 07/03/2007  . GERD 01/30/2007  . HYPERCHOLESTEROLEMIA 12/07/2006  . Essential hypertension 12/07/2006  . Osteoarthritis of right knee 12/07/2006  . ANEMIA, IRON DEFICIENCY 01/06/2006    History obtained from: chart review and patient.  Melissa Chavez is a 59 y.o.  female presenting for a follow up visit.  She was last seen in October 2021 at which time she reestablished care.  Her lung testing looked low but it did improve with the Xopenex treatment.  We  did not make any changes since she was in a study.  We continued Dulera 200/5 mcg 2 puffs twice daily as well as albuterol as needed.  She had environmental allergy testing that was positive to grasses, ragweed, weeds, trees, indoor and outdoor molds, dust mite, cat, dog, and cockroach.  We continued Xyzal and Flonase and started Astelin.  She has a history of food allergies and was positive to cat fish, bass, flounder, Trout, and cod.  She was negative to salmon and tuna.  She was also negative to tomato and banana.  We gave her an anaphylaxis management plan and provided her with Auvi-Q training.  In the interim, she called with right otalgia.  She was already on amoxicillin clavulanic acid.  We added on doxycycline.  We also referred her to ENT. The doxycycline helped a lot. She fully recovered. We also gave her Decadron and this helped. She is now back to almost normal.  Regarding her ear issues, she now thinks that she does not needed a referral. She does not think that she needs to go. Her ear feels much better after the doxycycline and the dexamethasone.   Asthma/Respiratory Symptom History: She is now out of the asthma study. She is being considered for another one. She thinks that the [facility] is working fairly well, but she is using the rescue inhaler more often than before. She estimates that this is more than once per day. She is unsure whether this is the weather changes or what.   Allergic Rhinitis Symptom History: She does report that since taking the nasal spray, symptoms are improved. She has not needed antibiotics in quite some time. The last time that she had that was when they thought she had COVID19. She had testing for COVID19 as well Strep and this was all negative.   Otherwise, there have been no changes to her past medical history, surgical history, family history, or social history.    Review of Systems  Constitutional: Negative.  Negative for chills, fever, malaise/fatigue and  weight loss.  HENT: Negative for congestion, ear discharge, ear pain and sinus pain.   Eyes: Negative for pain, discharge and redness.  Respiratory: Negative for cough, sputum production, shortness of breath and wheezing.   Cardiovascular: Negative.  Negative for chest pain and palpitations.  Gastrointestinal: Negative for abdominal pain, constipation, diarrhea, heartburn, nausea and vomiting.  Skin: Negative.  Negative for itching and rash.  Neurological: Negative for dizziness and headaches.  Endo/Heme/Allergies: Positive for environmental allergies. Does not bruise/bleed easily.       Positive for food allergies.       Objective:   Blood pressure 124/82, pulse 100, temperature 97.7 F (36.5 C), temperature source Temporal, resp. rate 16, last menstrual period 05/30/2013, SpO2 98 %. There is no height or weight on file to calculate BMI.   Physical Exam:  Physical Exam Constitutional:      Appearance: She is well-developed.     Comments: Moving air well in all lung fields.  HENT:     Head: Normocephalic and atraumatic.     Right Ear: Tympanic membrane, ear canal and external ear normal.     Left Ear: Tympanic membrane, ear canal and external ear normal.     Nose: No nasal deformity, septal  deviation, mucosal edema, rhinorrhea or epistaxis.     Right Turbinates: Enlarged and swollen.     Left Turbinates: Enlarged and swollen.     Right Sinus: No maxillary sinus tenderness or frontal sinus tenderness.     Left Sinus: No maxillary sinus tenderness or frontal sinus tenderness.     Mouth/Throat:     Mouth: Oropharynx is clear and moist. Mucous membranes are not pale and not dry.     Pharynx: Uvula midline.  Eyes:     General:        Right eye: No discharge.        Left eye: No discharge.     Extraocular Movements: EOM normal.     Conjunctiva/sclera: Conjunctivae normal.     Right eye: Right conjunctiva is not injected. No chemosis.    Left eye: Left conjunctiva is not  injected. No chemosis.    Pupils: Pupils are equal, round, and reactive to light.  Cardiovascular:     Rate and Rhythm: Normal rate and regular rhythm.     Heart sounds: Normal heart sounds.  Pulmonary:     Effort: Pulmonary effort is normal. No tachypnea, accessory muscle usage or respiratory distress.     Breath sounds: Normal breath sounds. No wheezing, rhonchi or rales.     Comments: Moving air well in all lung fields. Chest:     Chest wall: No tenderness.  Lymphadenopathy:     Cervical: No cervical adenopathy.  Skin:    Coloration: Skin is not pale.     Findings: No abrasion, erythema, petechiae or rash. Rash is not papular, urticarial or vesicular.     Comments: No eczematous or urticarial lesions noted.  Neurological:     Mental Status: She is alert.  Psychiatric:        Mood and Affect: Mood and affect normal.        Behavior: Behavior is cooperative.      Diagnostic studies:    Spirometry: results abnormal (FEV1: 1.06/56%, FVC: 1.94/80%, FEV1/FVC: 55%).    Spirometry consistent with moderate obstructive disease.   Allergy Studies: none       Malachi Bonds, MD  [facility]

## 2020-02-13 NOTE — Patient Instructions (Addendum)
1. Moderate persistent asthma, uncomplicated - Testing looked better than last time, but your FEV1 is still not great (56%). - we are going to get some labs to see if you qualify for one of our biologic medications (Xolair, Eliberto Ivory, or Dupixent). - Your labs will determine which medication we go with.  - Daily controller medication(s): Dulera 200/33mcg two puffs twice daily with spacer - Prior to physical activity: albuterol 2 puffs 10-15 minutes before physical activity. - Rescue medications: albuterol 4 puffs every 4-6 hours as needed - Asthma control goals:  * Full participation in all desired activities (may need albuterol before activity) * Albuterol use two time or less a week on average (not counting use with activity) * Cough interfering with sleep two time or less a month * Oral steroids no more than once a year * No hospitalizations  2. Seasonal and perennial allergic rhinitis (grasses, ragweed, weeds, trees, indoor molds, outdoor molds, dust mites, cat, dog and cockroach) - Continue with: Xyzal (levocetirizine) 5mg  tablet once daily and Flonase (fluticasone) two sprays per nostril daily and Astelin (azelastine) 2 sprays per nostril 1-2 times daily as needed - Strongly consider nasal saline rinses 1-2 times daily to remove allergens from the nasal cavities as well as help with mucous clearance (this is especially helpful to do before the nasal sprays are given) - Strongly consider allergy shots as a means of long-term control. - Allergy shots "re-train" and "reset" the immune system to ignore environmental allergens and decrease the resulting immune response to those allergens (sneezing, itchy watery eyes, runny nose, nasal congestion, etc).    - Allergy shots improve symptoms in 75-85% of patients.   3. 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 05-25-1995 training provided.   4. Return in about 6 weeks  (around 03/26/2020).   Please inform 03/28/2020 of any Emergency Department visits, hospitalizations, or changes in symptoms. Call us before going to the ED for breathing or allergy symptoms since we might be able to fit you in for a sick visit. Feel free to contact us anytime with any questions, problems, or concerns.  It was a pleasure to see you again today!  Websites that have reliable patient information: 1. American Academy of Asthma, Allergy, and Immunology: www.aaaai.org 2. Food Allergy Research and Education (FARE): foodallergy.org 3. Mothers of Asthmatics: http://www.asthmacommunitynetwork.org 4. American College of Allergy, Asthma, and Immunology: www.acaai.org   COVID-19 Vaccine Information can be found at: [place] For questions related to vaccine distribution or appointments, please email [email or web address] or call [phone].     "Like" [phone] on Facebook and Instagram for our latest updates!     HAPPY FALL!     Make sure you are registered to vote! If you have moved or changed any of your contact information, you will need to get this updated before voting!  In some cases, you MAY be able to register to vote online: [place]

## 2020-02-14 ENCOUNTER — Ambulatory Visit (INDEPENDENT_AMBULATORY_CARE_PROVIDER_SITE_OTHER): Payer: Medicare Other | Admitting: *Deleted

## 2020-02-14 DIAGNOSIS — Z23 Encounter for immunization: Secondary | ICD-10-CM | POA: Diagnosis not present

## 2020-02-14 NOTE — Progress Notes (Signed)
   Covid-19 Vaccination Clinic  Name:  Melissa Chavez    MRN: [id number] DOB: Apr 03, 1960  02/14/2020  Ms. Melissa Chavez was observed post Covid-19 immunization for 15 minutes without incident. She was provided with Vaccine Information Sheet and instruction to access the V-Safe system.   Ms. Melissa Chavez was instructed to call 911 with any severe reactions post vaccine: Marland Kitchen Difficulty breathing  . Swelling of face and throat  . A fast heartbeat  . A bad rash all over body  . Dizziness and weakness   Immunizations Administered    Name Date Dose VIS Date Route   Pfizer COVID-19 Vaccine 02/14/2020  8:41 AM 0.3 mL 12/25/2019 Intramuscular   Manufacturer: [employer], [employer]   Lot: [id number]   NDC: [id number]

## 2020-02-17 LAB — CBC WITH DIFFERENTIAL/PLATELET
Basophils Absolute: 0.1 10*3/uL (ref 0.0–0.2)
Basos: 0 %
EOS (ABSOLUTE): 1.5 10*3/uL — ABNORMAL HIGH (ref 0.0–0.4)
Eos: 12 %
Hematocrit: 33.5 % — ABNORMAL LOW (ref 34.0–46.6)
Hemoglobin: 11 g/dL — ABNORMAL LOW (ref 11.1–15.9)
Immature Grans (Abs): 0 10*3/uL (ref 0.0–0.1)
Immature Granulocytes: 0 %
Lymphocytes Absolute: 3.8 10*3/uL — ABNORMAL HIGH (ref 0.7–3.1)
Lymphs: 30 %
MCH: 27.9 pg (ref 26.6–33.0)
MCHC: 32.8 g/dL (ref 31.5–35.7)
MCV: 85 fL (ref 79–97)
Monocytes Absolute: 0.7 10*3/uL (ref 0.1–0.9)
Monocytes: 5 %
Neutrophils Absolute: 6.5 10*3/uL (ref 1.4–7.0)
Neutrophils: 53 %
Platelets: 355 10*3/uL (ref 150–450)
RBC: 3.94 x10E6/uL (ref 3.77–5.28)
RDW: 14.3 % (ref 11.7–15.4)
WBC: 12.5 10*3/uL — ABNORMAL HIGH (ref 3.4–10.8)

## 2020-02-17 LAB — IGE: IgE (Immunoglobulin E), Serum: 6319 IU/mL — ABNORMAL HIGH (ref 6–495)

## 2020-02-21 NOTE — Addendum Note (Signed)
Addended by: Alfonse Spruce on: 02/21/2020 05:30 AM   Modules accepted: Orders

## 2020-02-21 NOTE — Addendum Note (Signed)
Addended by: Alfonse Spruce on: 02/21/2020 12:24 PM   Modules accepted: Orders

## 2020-02-21 NOTE — Progress Notes (Signed)
Discussed with Dr. Delorse Lek. She wisely recommended just doing the following first (in case the the absolute eosinophil was one isolated finding):   1. CBC with diff 2. CMP 3. Tryptase   I want to know more about this study she was in. Can we contact her to get more information? I think we got some spirometries from her study site, but can we get notes at all?   Thanks much!  Malachi Bonds, MD [facility]

## 2020-02-25 ENCOUNTER — Other Ambulatory Visit: Payer: Self-pay

## 2020-02-25 MED ORDER — ALBUTEROL SULFATE (2.5 MG/3ML) 0.083% IN NEBU: 2.5000 mg | INHALATION_SOLUTION | Freq: Four times a day (QID) | RESPIRATORY_TRACT | 1 refills | Status: DC | PRN

## 2020-02-25 NOTE — Telephone Encounter (Signed)
Prescription for albuterol nebulizer solution sent in per MD instruction.

## 2020-02-26 ENCOUNTER — Telehealth: Payer: Self-pay

## 2020-02-26 NOTE — Telephone Encounter (Signed)
Called and spoke to patient to ensure that she was able to properly set up her nebulizer machine and self administer the medication. Patient verbalized that she was able to use her machine as well as get her medication. Patient expressed that was going to her PCP, [facility] to get the EKG done per the request of Dr. Dellis Anes. Patient expressed her concern of going and not having orders for the EKG to be completed. I expressed to her that she could request the EKG be done at her PCP without any orders and that if there were any concerns or explanations stating that EKG could not be done that she could give [place] a call and we can speak to the appropriate staff to get the EKG completed today while at her PCP.

## 2020-03-03 LAB — T-HELPER CELLS CD4/CD8 %
% CD 4 Pos. Lymph.: 44.7 % (ref 30.8–58.5)
Absolute CD 4 Helper: 1609 /uL — ABNORMAL HIGH (ref 359–1519)
CD3+CD4+ Cells/CD3+CD8+ Cells Bld: 1.15 (ref 0.92–3.72)
CD3+CD8+ Cells # Bld: 1400 /uL — ABNORMAL HIGH (ref 109–897)
CD3+CD8+ Cells NFr Bld: 38.9 % — ABNORMAL HIGH (ref 12.0–35.5)

## 2020-03-03 LAB — LYMPH ENUMERATION, BASIC & NK CELLS
% CD 3 Pos. Lymph.: 81.7 % (ref 57.5–86.2)
% CD 4 Pos. Lymph.: 46.6 % (ref 30.8–58.5)
% NK (CD56/16): 10.6 % (ref 1.4–19.4)
Ab NK (CD56/16): 382 /uL (ref 24–406)
Absolute CD 3: 2941 /uL — ABNORMAL HIGH (ref 622–2402)
Absolute CD 4 Helper: 1678 /uL — ABNORMAL HIGH (ref 359–1519)
Basophils Absolute: 0 10*3/uL (ref 0.0–0.2)
Basos: 0 %
CD19 % B Cell: 7.4 % (ref 3.3–25.4)
CD19 Abs: 266 /uL (ref 12–645)
CD4/CD8 Ratio: 1.25 (ref 0.92–3.72)
CD8 % Suppressor T Cell: 37.3 % — ABNORMAL HIGH (ref 12.0–35.5)
CD8 T Cell Abs: 1343 /uL — ABNORMAL HIGH (ref 109–897)
EOS (ABSOLUTE): 0.6 10*3/uL — ABNORMAL HIGH (ref 0.0–0.4)
Eos: 6 %
Hematocrit: 35.6 % (ref 34.0–46.6)
Hemoglobin: 11.3 g/dL (ref 11.1–15.9)
Immature Grans (Abs): 0 10*3/uL (ref 0.0–0.1)
Immature Granulocytes: 0 %
Lymphocytes Absolute: 3.6 10*3/uL — ABNORMAL HIGH (ref 0.7–3.1)
Lymphs: 34 %
MCH: 27.2 pg (ref 26.6–33.0)
MCHC: 31.7 g/dL (ref 31.5–35.7)
MCV: 86 fL (ref 79–97)
Monocytes Absolute: 0.6 10*3/uL (ref 0.1–0.9)
Monocytes: 6 %
Neutrophils Absolute: 5.7 10*3/uL (ref 1.4–7.0)
Neutrophils: 54 %
Platelets: 374 10*3/uL (ref 150–450)
RBC: 4.15 x10E6/uL (ref 3.77–5.28)
RDW: 14.2 % (ref 11.7–15.4)
WBC: 10.6 10*3/uL (ref 3.4–10.8)

## 2020-03-03 LAB — CMP14+EGFR
ALT: 18 IU/L (ref 0–32)
AST: 12 IU/L (ref 0–40)
Albumin/Globulin Ratio: 1.4 (ref 1.2–2.2)
Albumin: 4 g/dL (ref 3.8–4.9)
Alkaline Phosphatase: 123 IU/L — ABNORMAL HIGH (ref 44–121)
BUN/Creatinine Ratio: 16 (ref 12–28)
BUN: 19 mg/dL (ref 8–27)
Bilirubin Total: <0.2 mg/dL (ref 0.0–1.2)
CO2: 25 mmol/L (ref 20–29)
Calcium: 9.7 mg/dL (ref 8.7–10.3)
Chloride: 101 mmol/L (ref 96–106)
Creatinine, Ser: 1.2 mg/dL — ABNORMAL HIGH (ref 0.57–1.00)
GFR calc Af Amer: 57 mL/min/{1.73_m2} — ABNORMAL LOW (ref >59)
GFR calc non Af Amer: 49 mL/min/{1.73_m2} — ABNORMAL LOW (ref >59)
Globulin, Total: 2.8 g/dL (ref 1.5–4.5)
Glucose: 114 mg/dL — ABNORMAL HIGH (ref 65–99)
Potassium: 4.4 mmol/L (ref 3.5–5.2)
Sodium: 139 mmol/L (ref 134–144)
Total Protein: 6.8 g/dL (ref 6.0–8.5)

## 2020-03-03 LAB — IGG, IGA, IGM
IgA/Immunoglobulin A, Serum: 230 mg/dL (ref 87–352)
IgG (Immunoglobin G), Serum: 1370 mg/dL (ref 586–1602)
IgM (Immunoglobulin M), Srm: 53 mg/dL (ref 26–217)

## 2020-03-03 LAB — TRYPTASE: Tryptase: 12.1 ug/L (ref 2.2–13.2)

## 2020-03-03 LAB — MPN W/HYPEREOSINOPHILIA FISH

## 2020-03-03 LAB — CK: Total CK: 164 U/L (ref 32–182)

## 2020-03-03 LAB — TROPONIN T: Troponin T (Highly Sensitive): 25 ng/L (ref 0–14)

## 2020-03-03 LAB — VITAMIN B12: Vitamin B-12: >2000 pg/mL — ABNORMAL HIGH (ref 232–1245)

## 2020-03-03 LAB — ANCA TITERS
Atypical pANCA: <1:20 {titer}
C-ANCA: <1:20 {titer}
P-ANCA: <1:20 {titer}

## 2020-03-04 NOTE — Addendum Note (Signed)
Addended by: Alfonse Spruce on: 03/04/2020 08:44 AM   Modules accepted: Orders

## 2020-03-10 DIAGNOSIS — H25013 Cortical age-related cataract, bilateral: Secondary | ICD-10-CM | POA: Diagnosis not present

## 2020-03-10 DIAGNOSIS — H2511 Age-related nuclear cataract, right eye: Secondary | ICD-10-CM | POA: Diagnosis not present

## 2020-03-10 DIAGNOSIS — H18413 Arcus senilis, bilateral: Secondary | ICD-10-CM | POA: Diagnosis not present

## 2020-03-10 DIAGNOSIS — H2513 Age-related nuclear cataract, bilateral: Secondary | ICD-10-CM | POA: Diagnosis not present

## 2020-03-10 DIAGNOSIS — H25043 Posterior subcapsular polar age-related cataract, bilateral: Secondary | ICD-10-CM | POA: Diagnosis not present

## 2020-03-13 DIAGNOSIS — H2512 Age-related nuclear cataract, left eye: Secondary | ICD-10-CM | POA: Diagnosis not present

## 2020-03-13 DIAGNOSIS — H2511 Age-related nuclear cataract, right eye: Secondary | ICD-10-CM | POA: Diagnosis not present

## 2020-03-17 ENCOUNTER — Encounter: Payer: Self-pay | Admitting: Allergy & Immunology

## 2020-03-17 ENCOUNTER — Other Ambulatory Visit: Payer: Self-pay

## 2020-03-17 ENCOUNTER — Ambulatory Visit: Payer: Medicare Other | Admitting: Allergy & Immunology

## 2020-03-17 VITALS — BP 146/82 | HR 98 | Temp 97.6°F | Resp 18 | Ht 62.0 in | Wt 276.0 lb

## 2020-03-17 DIAGNOSIS — E1142 Type 2 diabetes mellitus with diabetic polyneuropathy: Secondary | ICD-10-CM

## 2020-03-17 DIAGNOSIS — R899 Unspecified abnormal finding in specimens from other organs, systems and tissues: Secondary | ICD-10-CM | POA: Diagnosis not present

## 2020-03-17 DIAGNOSIS — J4540 Moderate persistent asthma, uncomplicated: Secondary | ICD-10-CM

## 2020-03-17 DIAGNOSIS — J4550 Severe persistent asthma, uncomplicated: Secondary | ICD-10-CM | POA: Diagnosis not present

## 2020-03-17 MED ORDER — MEPOLIZUMAB 100 MG ~~LOC~~ SOLR
100.0000 mg | Freq: Once | SUBCUTANEOUS | Status: AC
  Administered 2020-03-17: 100 mg via SUBCUTANEOUS

## 2020-03-17 MED ORDER — LEVOCETIRIZINE DIHYDROCHLORIDE 5 MG PO TABS: 5.0000 mg | ORAL_TABLET | Freq: Every day | ORAL | 1 refills | Status: DC

## 2020-03-17 MED ORDER — DULERA 200-5 MCG/ACT IN AERO: 2.0000 | INHALATION_SPRAY | Freq: Two times a day (BID) | RESPIRATORY_TRACT | 1 refills | Status: DC

## 2020-03-17 MED ORDER — FLUTICASONE PROPIONATE 50 MCG/ACT NA SUSP: NASAL | 1 refills | Status: DC

## 2020-03-17 MED ORDER — EPINEPHRINE 0.3 MG/0.3ML IJ SOAJ: 0.3000 mg | Freq: Once | INTRAMUSCULAR | 1 refills | Status: AC

## 2020-03-17 MED ORDER — VENTOLIN HFA 108 (90 BASE) MCG/ACT IN AERS: INHALATION_SPRAY | RESPIRATORY_TRACT | 0 refills | Status: DC

## 2020-03-17 NOTE — Progress Notes (Signed)
Immunotherapy   Patient Details  Name: GEM CONKLE MRN: [id number] Date of Birth: 06-Dec-1960  03/17/2020  Threasa Alpha started injections for  Nucala 100 mg given in the office during her visit. Patient tolerated injection well and waited her 30 min's in the office. Frequency: 4 weeks Epi-Pen:Epi-Pen Available  Consent signed and patient instructions given.   Luiz Ochoa Liberty Seto 03/17/2020, 9:01 AM

## 2020-03-17 NOTE — Progress Notes (Signed)
FOLLOW UP  Date of Service/Encounter:  03/17/20   Assessment:   Moderate persistent asthma, uncomplicated  Seasonal and perennial allergic rhinitis(grasses, ragweed, weeds, trees, indoor molds, outdoor molds, dust mites, cat, dog and cockroach)  Anaphylactic shock due to food (catfish, bass, trout, flounder, and cod)   Eosinophilia - down to 600 as of the last time we checked  Elevated troponin - with normal EKG (rechecking level today)  Elevated vitamin B12 - stopped supplementation  Plan/Recommendations:   1. Moderate persistent asthma, uncomplicated - Testing looked better than last time, but your FEV1 is still not great (57%). - Sample of Nucala provided today.  - Consent signed today.  - Melissa Chavez will be reaching out to discuss more. - I am getting a repeat complete blood count as well as a repeat troponin (heart enzyme) and a hemoglobin A1c.  - Daily controller medication(s): Dulera 200/58mg two puffs twice daily with spacer + Nucala monthly - Prior to physical activity: albuterol 2 puffs 10-15 minutes before physical activity. - Rescue medications: albuterol 4 puffs every 4-6 hours as needed - Asthma control goals:  * Full participation in all desired activities (may need albuterol before activity) * Albuterol use two time or less a week on average (not counting use with activity) * Cough interfering with sleep two time or less a month * Oral steroids no more than once a year * No hospitalizations  2. Seasonal and perennial allergic rhinitis (grasses, ragweed, weeds, trees, indoor molds, outdoor molds, dust mites, cat, dog and cockroach) - Continue with: Xyzal (levocetirizine) 581mtablet once daily and Flonase (fluticasone) two sprays per nostril daily and Astelin (azelastine) 2 sprays per nostril 1-2 times daily as needed - Strongly consider nasal saline rinses 1-2 times daily to remove allergens from the nasal cavities as well as help with mucous clearance (this  is especially helpful to do before the nasal sprays are given) - Strongly consider allergy shots as a means of long-term control (we can give these WITH the Nucala).  - Allergy shots "re-train" and "reset" the immune system to ignore environmental allergens and decrease the resulting immune response to those allergens (sneezing, itchy watery eyes, runny nose, nasal congestion, etc).    - Allergy shots improve symptoms in 75-85% of patients.   3. 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 - Melissa Chavez provided.   4. Return in about 2 months (around 05/15/2020).    Subjective:   Melissa Chavez a [age].o. female presenting today for follow up of  Chief Complaint  Patient presents with  . Asthma  . Food Intolerance    Fish allergy, bananas,     Melissa Chavez a history of the following: Patient Active Problem List   Diagnosis Date Noted  . Unilateral primary osteoarthritis, left knee 09/13/2016  . Unilateral primary osteoarthritis, right knee 09/13/2016  . Pressure injury of skin 03/08/2016  . Acute gout of left knee 03/07/2016  . Sinus congestion 11/29/2015  . Bilateral leg edema 09/25/2015  . Abdominal pain, left upper quadrant 09/25/2015  . 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 Prednisone adverse reaction 08/20/2015  . Abdominal pain, chronic, epigastric 06/30/2015  . Osteoarthritis of right foot 03/22/2015  . Type 2 diabetes mellitus with diabetic polyneuropathy, without long-term current use of insulin ([facility]01/13/2017  . BMI 40.0-44.9, adult ([facility]04/11/2013  . Overactive bladder 06/13/2013  . Allergic rhinitis  06/13/2013  . Asthma, moderate persistent 06/13/2013  . BIPOLAR DISORDER UNSPECIFIED 03/18/2008  . PERIPHERAL EDEMA 07/03/2007  . GERD 01/30/2007  . HYPERCHOLESTEROLEMIA 12/07/2006  . Essential hypertension 12/07/2006  . Osteoarthritis of right knee 12/07/2006   . ANEMIA, IRON DEFICIENCY 01/06/2006    History obtained from: chart review and patient.  Melissa Chavez is a 60 y.o. female presenting for a follow up visit.  She was last seen in December 2021.  At that time, her lung testing looked better than last time, but her FEV1 was still 56%.  We obtain labs to see if she will qualify for one of her Biologics.  We had previously not started a biologic because she was in the midst of doing some asthma trials with another clinic.  We continued her Dulera 200/5 mcg 2 puffs twice daily.  For her rhinitis, we continue with Xyzal as well as Flonase and Astelin.  We did discuss allergy shots as well.  Labs demonstrated an absolute eosinophil count of 1500, so we obtained quite a few additional labs.  A repeat eosinophil count was 600.  Vitamin B12 was greater than 2000.  Immunoglobulin levels were all normal.  Troponin level was 25 with a normal range of 0-14; she denied cardiac symptoms and she told us that an EKG at her PCPs office was normal.  We did order a repeat troponin level which has not been drawn.  ANCA panel was negative.  Genetic testing to look for myeloproliferative syndromes associated with a eosinophil demonstrated no deletion or rearrangement of PDGFRA, PDGFRB, OR FGFR1.  Flow cytometry demonstrated elevated CD4 T cells as well as elevated CD8 T cells. IgE level was 6319.   Since the last visit, she has done well. She does report that she was taking 3000 units daily (was supposed to do 500 units daily). She did have cataract taken out of her right eye. Left one is scheduled for January 21st (she is followed by Melissa Chavez, whom she clearly adores).    She did have an EKG, which was done on 12/22/2019 which was normal. She denies current chest pain. She is having SOB but thinks that this is related to her uncontrolled asthma. She is not interested in doing another study with the clinic where she was going for these at the last visit. She just wants to get her  breathing under better control.   Asthma/Respiratory Symptom History: She prefers the Ventolin. She is on the ProAir which she does not feel works very well. She is using her Dulera two puffs twice daily. Overall, she feels that breathing has become worse over the last 6 months or so. She is motivated to try other modalities to treat her shortness of breath.   Allergic Rhinitis Symptom History: She remains on the Xyzal daily. She does not use her nose sprays on a regular basis, but she does say that the Astelin helps a lot with her symptoms. She has not needed antibiotics at all since the last visit.   Otherwise, there have been no changes to her past medical history, surgical history, family history, or social history.   Review of Systems  Constitutional: Negative.  Negative for chills, fever, malaise/fatigue and weight loss.  HENT: Negative.  Negative for congestion, ear discharge, ear pain and sore throat.   Eyes: Negative for pain, discharge and redness.  Respiratory: Positive for shortness of breath. Negative for cough, sputum production and wheezing.   Cardiovascular: Negative.  Negative for chest pain and palpitations.  Gastrointestinal: Negative for abdominal pain, constipation, diarrhea, heartburn, nausea and vomiting.  Skin: Negative.  Negative for itching and rash.  Neurological: Negative for dizziness and headaches.  Endo/Heme/Allergies: Negative for environmental allergies. Does not bruise/bleed easily.       Objective:   Last menstrual period 05/30/2013. There is no height or weight on file to calculate BMI.   Physical Exam: General:  alert, active, in no acute distress. Very talkative.  Head:  normocephalic, no masses, lesions, tenderness or abnormalities Eyes:  conjunctiva clear without injection or discharge, EOMI, PERL. Ears:  TM's pearly white bilaterally, external auditory canals are clear, external ears are normally set and rotated. No drainage.  Nose:   External nose within normal limits, normal appearing turbinates, clear-colored discharge, septum midline Throat:  moist mucous membranes without erythema, exudates or petechiae, no thrush Neck:  Supple without thyromegaly or adenopathy appreciated Lymphatic: no adenopathy appreciated in the cervical, posterior auricular, axillary, epitrochlear, inguinal, or popliteal regions  Lungs:  clear to auscultation, no wheezing, crackles or rhonchi, breathing unlabored, moving air well in all lung fields Heart:  regular rate and rhythm, normal S1/S2, no murmurs or gallops, normal peripheral perfusion Abdomen:  Soft, non-tender, BS normal, no masses, no organomegaly Neuro:  Normal mental status, speech normal, alert and oriented x3 Musculoskeletal:  no cyanosis, clubbing or edema Skin:  skin color, texture and turgor are normal; no bruising, rashes or lesions noted. No eczematous or urticarial lesions noted.  Psych: Normal Affect and mood   Diagnostic studies:  Spirometry: results abnormal (FEV1: 1.08/57%, FVC: 1.79/74%, FEV1/FVC: 60%).    Spirometry consistent with moderate obstructive disease. Overall values are slightly increased compared to those obtained at the last visit.   Allergy Studies: none      Salvatore Marvel, MD  [facility]

## 2020-03-17 NOTE — Patient Instructions (Addendum)
1. Moderate persistent asthma, uncomplicated - Testing looked better than last time, but your FEV1 is still not great (57%). - Sample of Nucala provided today.  - Consent signed today.  - [name] will be reaching out to discuss more. - I am getting a repeat complete blood count as well as a repeat troponin (heart enzyme) and a hemoglobin A1c.  - Daily controller medication(s): Dulera 200/39mcg two puffs twice daily with spacer + Nucala monthly - Prior to physical activity: albuterol 2 puffs 10-15 minutes before physical activity. - Rescue medications: albuterol 4 puffs every 4-6 hours as needed - Asthma control goals:  * Full participation in all desired activities (may need albuterol before activity) * Albuterol use two time or less a week on average (not counting use with activity) * Cough interfering with sleep two time or less a month * Oral steroids no more than once a year * No hospitalizations  2. Seasonal and perennial allergic rhinitis (grasses, ragweed, weeds, trees, indoor molds, outdoor molds, dust mites, cat, dog and cockroach) - Continue with: Xyzal (levocetirizine) 5mg  tablet once daily and Flonase (fluticasone) two sprays per nostril daily and Astelin (azelastine) 2 sprays per nostril 1-2 times daily as needed - Strongly consider nasal saline rinses 1-2 times daily to remove allergens from the nasal cavities as well as help with mucous clearance (this is especially helpful to do before the nasal sprays are given) - Strongly consider allergy shots as a means of long-term control (we can give these WITH the Nucala).  - Allergy shots "re-train" and "reset" the immune system to ignore environmental allergens and decrease the resulting immune response to those allergens (sneezing, itchy watery eyes, runny nose, nasal congestion, etc).    - Allergy shots improve symptoms in 75-85% of patients.   3. Anaphylactic shock due to food (catfish, bass, trout, flounder, and cod) - Continue to  avoid triggering foods. 05-25-1995 training provided.   4. Return in about 2 months (around 05/15/2020).    Please inform 07/15/2020 of any Emergency Department visits, hospitalizations, or changes in symptoms. Call us before going to the ED for breathing or allergy symptoms since we might be able to fit you in for a sick visit. Feel free to contact us anytime with any questions, problems, or concerns.  It was a pleasure to see you again today!  Websites that have reliable patient information: 1. American Academy of Asthma, Allergy, and Immunology: www.aaaai.org 2. Food Allergy Research and Education (FARE): foodallergy.org 3. Mothers of Asthmatics: http://www.asthmacommunitynetwork.org 4. American College of Allergy, Asthma, and Immunology: www.acaai.org   COVID-19 Vaccine Information can be found at: [place] For questions related to vaccine distribution or appointments, please email [email or web address] or call [phone].     "Like" [phone] on Facebook and Instagram for our latest updates!       Make sure you are registered to vote! If you have moved or changed any of your contact information, you will need to get this updated before voting!  In some cases, you MAY be able to register to vote online: [place]

## 2020-03-18 ENCOUNTER — Encounter: Payer: Self-pay | Admitting: Allergy & Immunology

## 2020-03-18 DIAGNOSIS — R778 Other specified abnormalities of plasma proteins: Secondary | ICD-10-CM

## 2020-03-18 DIAGNOSIS — D721 Eosinophilia, unspecified: Secondary | ICD-10-CM

## 2020-03-18 LAB — CBC WITH DIFFERENTIAL
Basophils Absolute: 0 10*3/uL (ref 0.0–0.2)
Basos: 0 %
EOS (ABSOLUTE): 0.4 10*3/uL (ref 0.0–0.4)
Eos: 4 %
Hematocrit: 32.2 % — ABNORMAL LOW (ref 34.0–46.6)
Hemoglobin: 10.6 g/dL — ABNORMAL LOW (ref 11.1–15.9)
Immature Grans (Abs): 0 10*3/uL (ref 0.0–0.1)
Immature Granulocytes: 0 %
Lymphocytes Absolute: 3.3 10*3/uL — ABNORMAL HIGH (ref 0.7–3.1)
Lymphs: 33 %
MCH: 27.6 pg (ref 26.6–33.0)
MCHC: 32.9 g/dL (ref 31.5–35.7)
MCV: 84 fL (ref 79–97)
Monocytes Absolute: 0.7 10*3/uL (ref 0.1–0.9)
Monocytes: 7 %
Neutrophils Absolute: 5.4 10*3/uL (ref 1.4–7.0)
Neutrophils: 56 %
RBC: 3.84 x10E6/uL (ref 3.77–5.28)
RDW: 13.8 % (ref 11.7–15.4)
WBC: 9.9 10*3/uL (ref 3.4–10.8)

## 2020-03-18 LAB — HEMOGLOBIN A1C
Est. average glucose Bld gHb Est-mCnc: 209 mg/dL
Hgb A1c MFr Bld: 8.9 % — ABNORMAL HIGH (ref 4.8–5.6)

## 2020-03-18 LAB — TROPONIN T: Troponin T (Highly Sensitive): 26 ng/L (ref 0–14)

## 2020-03-18 NOTE — Progress Notes (Signed)
Troponin came back elevated. I know she had a normal EKG in November, but I really want her to get an echocardiogram to make sure that everything looks good.   I am not sure how to arrange this, but I am sending to both the [employer] and [place].   Malachi Bonds, MD [facility] [place]

## 2020-03-19 ENCOUNTER — Telehealth: Payer: Self-pay | Admitting: *Deleted

## 2020-03-19 NOTE — Telephone Encounter (Signed)
-----   Message from Alfonse Spruce, MD sent at 03/17/2020 12:12 PM EST ----- [facility] - gave Nucala sample in the office today. Multiple elevated eosinophils.

## 2020-03-19 NOTE — Telephone Encounter (Signed)
L/m for patient to contact me to discuss getting Nucala through patient assistance for affordability with [facility]

## 2020-03-20 ENCOUNTER — Telehealth: Payer: Self-pay

## 2020-03-20 DIAGNOSIS — M170 Bilateral primary osteoarthritis of knee: Secondary | ICD-10-CM | POA: Diagnosis not present

## 2020-03-20 DIAGNOSIS — M1711 Unilateral primary osteoarthritis, right knee: Secondary | ICD-10-CM | POA: Diagnosis not present

## 2020-03-20 DIAGNOSIS — J4540 Moderate persistent asthma, uncomplicated: Secondary | ICD-10-CM | POA: Diagnosis not present

## 2020-03-20 DIAGNOSIS — D649 Anemia, unspecified: Secondary | ICD-10-CM | POA: Diagnosis not present

## 2020-03-20 DIAGNOSIS — E1122 Type 2 diabetes mellitus with diabetic chronic kidney disease: Secondary | ICD-10-CM | POA: Diagnosis not present

## 2020-03-20 DIAGNOSIS — I129 Hypertensive chronic kidney disease with stage 1 through stage 4 chronic kidney disease, or unspecified chronic kidney disease: Secondary | ICD-10-CM | POA: Diagnosis not present

## 2020-03-20 DIAGNOSIS — N1831 Chronic kidney disease, stage 3a: Secondary | ICD-10-CM | POA: Diagnosis not present

## 2020-03-20 DIAGNOSIS — E7800 Pure hypercholesterolemia, unspecified: Secondary | ICD-10-CM | POA: Diagnosis not present

## 2020-03-20 NOTE — Telephone Encounter (Signed)
Contacted patient health insurance and no prior authorization needed for Echocardiogram effective since 2018

## 2020-03-20 NOTE — Telephone Encounter (Signed)
-----   Message from Alfonse Spruce, MD sent at 03/19/2020  4:17 PM EST ----- Regarding: RE: PA# for echocardiogram Thank you! Routing to my staff to get that done.  Sincerely, Malachi Bonds, MD [facility]  ----- Message ----- From: Minette Brine Sent: 03/19/2020   3:47 PM EST To: Alfonse Spruce, MD Subject: PA# for echocardiogram                         We received an order for an Echocardiogram from your Provider.  Will you please check to see if a Prior Authorization is needed for the following CPT codes: 45809, [id number], [phone].  Once PA# is completed  please document on  the order in the comment line and we will be glad to call and schedule.  The appointment will not be able to be made until the PA# is obtained. Thank you so much for your help with this.     Please give to Referral Coordinator to obtain.  Thank you, Misty Stanley

## 2020-03-23 ENCOUNTER — Other Ambulatory Visit: Payer: Self-pay | Admitting: *Deleted

## 2020-03-23 MED ORDER — VENTOLIN HFA 108 (90 BASE) MCG/ACT IN AERS: INHALATION_SPRAY | RESPIRATORY_TRACT | 0 refills | Status: DC

## 2020-03-23 NOTE — Telephone Encounter (Signed)
Spoke to patient and she is not aware if she has extra help for medications so I will get approval and mail patient [place] to [facility] patient assistance form to cover bases in case she is not able to afford copays for drug

## 2020-03-24 NOTE — Telephone Encounter (Signed)
Did someone contact [facility] where they do the Echocardiogram to let her know the PA isn't needed?  Thanks

## 2020-03-26 ENCOUNTER — Telehealth: Payer: Self-pay | Admitting: Allergy & Immunology

## 2020-03-26 NOTE — Telephone Encounter (Signed)
I talked to her about this last time.  I want to make sure that eosinophils have not infiltrated her myocardium.  Her cardiac enzymes were elevated, which makes me concerned.  I will feel much better if the echocardiogram is normal.   There is no need to call her back, if you answered her question fully.  Malachi Bonds, MD [facility] [place]

## 2020-03-26 NOTE — Telephone Encounter (Signed)
A message has been sent through Epic informing Melissa Chavez.

## 2020-03-26 NOTE — Telephone Encounter (Signed)
Patient called and would like for you to call her about the echocardiogram that you what her ti get.  [phone].

## 2020-03-26 NOTE — Telephone Encounter (Signed)
Patient was concern to why she was needing the echocardiogram. Advise patient she had abnormal labs that were concerning. Patient stated she had an EKG done and it was normal. Patient was advise that they are needing to make sure that the heart is functionally normal and will advise her on the echocardiogram results.

## 2020-03-26 NOTE — Telephone Encounter (Signed)
No PA required for this insurance plan for [facility] [employer]. Reference # is [id number]  Patient was notified of echo cardiogram needed.

## 2020-03-27 NOTE — Telephone Encounter (Signed)
I did  thanks

## 2020-03-31 DIAGNOSIS — M62838 Other muscle spasm: Secondary | ICD-10-CM | POA: Diagnosis not present

## 2020-04-02 ENCOUNTER — Ambulatory Visit: Payer: Medicare Other | Admitting: Allergy & Immunology

## 2020-04-05 DIAGNOSIS — S161XXD Strain of muscle, fascia and tendon at neck level, subsequent encounter: Secondary | ICD-10-CM | POA: Diagnosis not present

## 2020-04-05 DIAGNOSIS — X58XXXA Exposure to other specified factors, initial encounter: Secondary | ICD-10-CM | POA: Diagnosis not present

## 2020-04-08 DIAGNOSIS — I129 Hypertensive chronic kidney disease with stage 1 through stage 4 chronic kidney disease, or unspecified chronic kidney disease: Secondary | ICD-10-CM | POA: Diagnosis not present

## 2020-04-08 DIAGNOSIS — E7800 Pure hypercholesterolemia, unspecified: Secondary | ICD-10-CM | POA: Diagnosis not present

## 2020-04-08 DIAGNOSIS — E1122 Type 2 diabetes mellitus with diabetic chronic kidney disease: Secondary | ICD-10-CM | POA: Diagnosis not present

## 2020-04-08 DIAGNOSIS — Z1211 Encounter for screening for malignant neoplasm of colon: Secondary | ICD-10-CM | POA: Diagnosis not present

## 2020-04-08 DIAGNOSIS — M1A0710 Idiopathic chronic gout, right ankle and foot, without tophus (tophi): Secondary | ICD-10-CM | POA: Diagnosis not present

## 2020-04-09 DIAGNOSIS — Z1211 Encounter for screening for malignant neoplasm of colon: Secondary | ICD-10-CM | POA: Diagnosis not present

## 2020-04-14 ENCOUNTER — Other Ambulatory Visit: Payer: Self-pay

## 2020-04-14 ENCOUNTER — Ambulatory Visit (INDEPENDENT_AMBULATORY_CARE_PROVIDER_SITE_OTHER): Payer: Medicare Other | Admitting: *Deleted

## 2020-04-14 DIAGNOSIS — J4540 Moderate persistent asthma, uncomplicated: Secondary | ICD-10-CM

## 2020-04-14 DIAGNOSIS — J4550 Severe persistent asthma, uncomplicated: Secondary | ICD-10-CM

## 2020-04-14 MED ORDER — MEPOLIZUMAB 100 MG ~~LOC~~ SOLR
100.0000 mg | SUBCUTANEOUS | Status: DC
  Administered 2020-04-14 – 2021-01-19 (×8): 100 mg via SUBCUTANEOUS

## 2020-04-16 ENCOUNTER — Other Ambulatory Visit (HOSPITAL_COMMUNITY): Payer: Medicare Other

## 2020-04-17 NOTE — Telephone Encounter (Signed)
Patient called because her echocardiogram was rescheduled, but again, she wanted to know why she had to get this. Patient states that her PCP said they did not know why she needed it because there were no issues with her EKG.  Patient also states that she does not think the Nucala shot is working. Patient states she didn't know if she was suppose to see results by now, but she is having the same problems.  Please advise.

## 2020-04-17 NOTE — Telephone Encounter (Signed)
Dr. Dellis Anes the patient has called again requesting reason for th echo.

## 2020-04-19 NOTE — Telephone Encounter (Signed)
It is part of the workup for a hypereosinophilic workup. Her eosinophils were up to 1500 at one point, which is VERY concerning. This syndrome can lead to invasion of different organs - including the heart - and cause irreversible damage. Her troponin (heart enzymes) were elevated TWICE. This can point to heart damage. We need to rule out these serious complications of elevated eosinophils.  When is her next appointment? We can certainly change biologics. We typically want three injections to see a difference.   Malachi Bonds, MD [facility] [place]

## 2020-04-20 NOTE — Telephone Encounter (Signed)
Called and spoke to patient and informed her of the note per Dr. Dellis Anes. Patient expressed understanding and agreed to got the test done as soon as possible. Patient and I confirmed her upcoming appointment.

## 2020-04-24 DIAGNOSIS — H2512 Age-related nuclear cataract, left eye: Secondary | ICD-10-CM | POA: Diagnosis not present

## 2020-05-03 ENCOUNTER — Other Ambulatory Visit: Payer: Self-pay | Admitting: Allergy & Immunology

## 2020-05-12 ENCOUNTER — Other Ambulatory Visit: Payer: Self-pay

## 2020-05-12 ENCOUNTER — Ambulatory Visit (INDEPENDENT_AMBULATORY_CARE_PROVIDER_SITE_OTHER): Payer: Medicare HMO | Admitting: Allergy & Immunology

## 2020-05-12 DIAGNOSIS — J4550 Severe persistent asthma, uncomplicated: Secondary | ICD-10-CM

## 2020-05-12 DIAGNOSIS — J4540 Moderate persistent asthma, uncomplicated: Secondary | ICD-10-CM

## 2020-05-18 ENCOUNTER — Other Ambulatory Visit: Payer: Self-pay

## 2020-05-18 ENCOUNTER — Telehealth: Payer: Self-pay | Admitting: Allergy & Immunology

## 2020-05-18 ENCOUNTER — Ambulatory Visit (HOSPITAL_COMMUNITY): Payer: Medicare HMO | Attending: Cardiology

## 2020-05-18 DIAGNOSIS — M170 Bilateral primary osteoarthritis of knee: Secondary | ICD-10-CM | POA: Diagnosis not present

## 2020-05-18 DIAGNOSIS — I517 Cardiomegaly: Secondary | ICD-10-CM

## 2020-05-18 DIAGNOSIS — E119 Type 2 diabetes mellitus without complications: Secondary | ICD-10-CM | POA: Diagnosis not present

## 2020-05-18 DIAGNOSIS — I129 Hypertensive chronic kidney disease with stage 1 through stage 4 chronic kidney disease, or unspecified chronic kidney disease: Secondary | ICD-10-CM | POA: Diagnosis not present

## 2020-05-18 DIAGNOSIS — M1711 Unilateral primary osteoarthritis, right knee: Secondary | ICD-10-CM | POA: Diagnosis not present

## 2020-05-18 DIAGNOSIS — N1831 Chronic kidney disease, stage 3a: Secondary | ICD-10-CM | POA: Diagnosis not present

## 2020-05-18 DIAGNOSIS — R778 Other specified abnormalities of plasma proteins: Secondary | ICD-10-CM | POA: Diagnosis not present

## 2020-05-18 DIAGNOSIS — E785 Hyperlipidemia, unspecified: Secondary | ICD-10-CM | POA: Insufficient documentation

## 2020-05-18 DIAGNOSIS — I119 Hypertensive heart disease without heart failure: Secondary | ICD-10-CM | POA: Diagnosis not present

## 2020-05-18 DIAGNOSIS — D721 Eosinophilia, unspecified: Secondary | ICD-10-CM | POA: Insufficient documentation

## 2020-05-18 DIAGNOSIS — E7800 Pure hypercholesterolemia, unspecified: Secondary | ICD-10-CM | POA: Diagnosis not present

## 2020-05-18 DIAGNOSIS — E1122 Type 2 diabetes mellitus with diabetic chronic kidney disease: Secondary | ICD-10-CM | POA: Diagnosis not present

## 2020-05-18 DIAGNOSIS — K219 Gastro-esophageal reflux disease without esophagitis: Secondary | ICD-10-CM | POA: Diagnosis not present

## 2020-05-18 DIAGNOSIS — J4540 Moderate persistent asthma, uncomplicated: Secondary | ICD-10-CM | POA: Diagnosis not present

## 2020-05-18 DIAGNOSIS — D649 Anemia, unspecified: Secondary | ICD-10-CM | POA: Diagnosis not present

## 2020-05-18 LAB — ECHOCARDIOGRAM COMPLETE
Area-P 1/2: 2.76 cm2
S' Lateral: 2.7 cm

## 2020-05-18 MED ORDER — ALBUTEROL SULFATE (2.5 MG/3ML) 0.083% IN NEBU: 2.5000 mg | INHALATION_SOLUTION | Freq: Four times a day (QID) | RESPIRATORY_TRACT | 1 refills | Status: DC | PRN

## 2020-05-18 NOTE — Telephone Encounter (Signed)
Spoke with patient and she changed pharmacies. Patient stated that the pharmacy said they reached out last week to get her nebulizer medications filled. Refill sent in and patient made aware.

## 2020-05-18 NOTE — Telephone Encounter (Signed)
Patient called stating she has been trying to get her albuterol neb solution refilled for weeks. Patient uses [facility] on [address]. Patient has an appointment with Dr. Dellis Anes tomorrow in [place] at 1:30pm  Please advise.

## 2020-05-19 ENCOUNTER — Ambulatory Visit: Payer: Medicare HMO | Admitting: Allergy & Immunology

## 2020-05-19 ENCOUNTER — Encounter: Payer: Self-pay | Admitting: Allergy & Immunology

## 2020-05-19 VITALS — BP 132/62 | HR 105 | Temp 98.2°F | Resp 16 | Ht 62.0 in | Wt 221.6 lb

## 2020-05-19 DIAGNOSIS — J4540 Moderate persistent asthma, uncomplicated: Secondary | ICD-10-CM

## 2020-05-19 DIAGNOSIS — J3089 Other allergic rhinitis: Secondary | ICD-10-CM | POA: Diagnosis not present

## 2020-05-19 DIAGNOSIS — J302 Other seasonal allergic rhinitis: Secondary | ICD-10-CM | POA: Diagnosis not present

## 2020-05-19 DIAGNOSIS — T7800XD Anaphylactic reaction due to unspecified food, subsequent encounter: Secondary | ICD-10-CM | POA: Diagnosis not present

## 2020-05-19 NOTE — Patient Instructions (Addendum)
1. Moderate persistent asthma, uncomplicated - Testing looked a little worse today.  - We will continue with the Nucala today.  - Daily controller medication(s): Dulera 200/54mcg two puffs twice daily with spacer - Prior to physical activity: albuterol 2 puffs 10-15 minutes before physical activity. - Rescue medications: albuterol 4 puffs every 4-6 hours as needed - Asthma control goals:  * Full participation in all desired activities (may need albuterol before activity) * Albuterol use two time or less a week on average (not counting use with activity) * Cough interfering with sleep two time or less a month * Oral steroids no more than once a year * No hospitalizations  2. Seasonal and perennial allergic rhinitis (grasses, ragweed, weeds, trees, indoor molds, outdoor molds, dust mites, cat, dog and cockroach) - Continue with: Xyzal (levocetirizine) 5mg  tablet once daily and Flonase (fluticasone) two sprays per nostril daily and Astelin (azelastine) 2 sprays per nostril 1-2 times daily as needed - Strongly consider nasal saline rinses 1-2 times daily to remove allergens from the nasal cavities as well as help with mucous clearance (this is especially helpful to do before the nasal sprays are given) - We will start on allergy shots as a means of long-term control. - Allergy shots "re-train" and "reset" the immune system to ignore environmental allergens and decrease the resulting immune response to those allergens (sneezing, itchy watery eyes, runny nose, nasal congestion, etc).    - Allergy shots improve symptoms in 75-85% of patients.   3. 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 - 05-25-1995 is up to date.   4. Return in about 3 months (around 08/19/2020).    Please inform 08/21/2020 of any Emergency Department visits, hospitalizations, or changes in symptoms. Call us before going to the ED for breathing or allergy symptoms since we  might be able to fit you in for a sick visit. Feel free to contact us anytime with any questions, problems, or concerns.  It was a pleasure to see you again today!  Websites that have reliable patient information: 1. American Academy of Asthma, Allergy, and Immunology: www.aaaai.org 2. Food Allergy Research and Education (FARE): foodallergy.org 3. Mothers of Asthmatics: http://www.asthmacommunitynetwork.org 4. American College of Allergy, Asthma, and Immunology: www.acaai.org   COVID-19 Vaccine Information can be found at: [place] For questions related to vaccine distribution or appointments, please email [email or web address] or call [phone].   We realize that you might be concerned about having an allergic reaction to the COVID19 vaccines. To help with that concern, WE ARE OFFERING THE COVID19 VACCINES IN OUR OFFICE! Ask the front desk for dates!     "Like" [phone] on Facebook and Instagram for our latest updates!      A healthy democracy works best when [place] participate! Make sure you are registered to vote! If you have moved or changed any of your contact information, you will need to get this updated before voting!  In some cases, you MAY be able to register to vote online: [employer]

## 2020-05-19 NOTE — Progress Notes (Signed)
FOLLOW UP  Date of Service/Encounter:  05/19/20   Assessment:   Moderate persistent asthma, uncomplicated  Seasonal and perennial allergic rhinitis(grasses, ragweed, weeds, trees, indoor molds, outdoor molds, dust mites, cat, dog and cockroach)  Anaphylactic shock due to food(catfish, bass, trout, flounder, and cod)  Eosinophilia - down to 600 as of the last time we checked  Elevated troponin - with normal EKG and echocardiogram  Elevated vitamin B12 - stopped supplementation  Plan/Recommendations:   1. Moderate persistent asthma, uncomplicated - Testing looked a little worse today.  - We will continue with the Nucala today.  - Daily controller medication(s): Dulera 200/54mcg two puffs twice daily with spacer - Prior to physical activity: albuterol 2 puffs 10-15 minutes before physical activity. - Rescue medications: albuterol 4 puffs every 4-6 hours as needed - Asthma control goals:  * Full participation in all desired activities (may need albuterol before activity) * Albuterol use two time or less a week on average (not counting use with activity) * Cough interfering with sleep two time or less a month * Oral steroids no more than once a year * No hospitalizations  2. Seasonal and perennial allergic rhinitis (grasses, ragweed, weeds, trees, indoor molds, outdoor molds, dust mites, cat, dog and cockroach) - Continue with: Xyzal (levocetirizine) 5mg  tablet once daily and Flonase (fluticasone) two sprays per nostril daily and Astelin (azelastine) 2 sprays per nostril 1-2 times daily as needed - Strongly consider nasal saline rinses 1-2 times daily to remove allergens from the nasal cavities as well as help with mucous clearance (this is especially helpful to do before the nasal sprays are given) - We will start on allergy shots as a means of long-term control. - Allergy shots "re-train" and "reset" the immune system to ignore environmental allergens and decrease the  resulting immune response to those allergens (sneezing, itchy watery eyes, runny nose, nasal congestion, etc).    - Allergy shots improve symptoms in 75-85% of patients.   3. 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 - 05-25-1995 is up to date.   4. Return in about 3 months (around 08/19/2020).    Subjective:   Melissa Chavez is a 60 y.o. female presenting today for follow up of  Chief Complaint  Patient presents with  . Asthma    Melissa Chavez has a history of the following: Patient Active Problem List   Diagnosis Date Noted  . Unilateral primary osteoarthritis, left knee 09/13/2016  . Unilateral primary osteoarthritis, right knee 09/13/2016  . Pressure injury of skin 03/08/2016  . Acute gout of left knee 03/07/2016  . Sinus congestion 11/29/2015  . Bilateral leg edema 09/25/2015  . Abdominal pain, left upper quadrant 09/25/2015  . 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 Prednisone adverse reaction 08/20/2015  . Abdominal pain, chronic, epigastric 06/30/2015  . Osteoarthritis of right foot 03/22/2015  . Type 2 diabetes mellitus with diabetic polyneuropathy, without long-term current use of insulin (HCC) 03/20/2015  . BMI 40.0-44.9, adult (HCC) 06/13/2013  . Overactive bladder 06/13/2013  . Allergic rhinitis 06/13/2013  . Asthma, moderate persistent 06/13/2013  . BIPOLAR DISORDER UNSPECIFIED 03/18/2008  . PERIPHERAL EDEMA 07/03/2007  . GERD 01/30/2007  . HYPERCHOLESTEROLEMIA 12/07/2006  . Essential hypertension 12/07/2006  . Osteoarthritis of right knee 12/07/2006  . ANEMIA, IRON DEFICIENCY 01/06/2006    History obtained from: chart review and patient.  Melissa Chavez is a 60 y.o. female presenting for  a follow up visit.  She was last seen in January 2022.  At that time, her testing did look better but her FEV1 was only up to 57%.  We gave her a sample of mepolizumab.  We continue Dulera  200/5 mcg twice daily with albuterol as needed.  For her allergic rhinitis, we continue with Xyzal as well as Flonase and Astelin.  In the interim, we got a work-up for hypereosinophilic syndrome, which included an elevated troponin x2.  She did finally have an echocardiogram which was normal.  Since last visit, she has done well.   Asthma/Respiratory Symptom History: She remains on the [facility] two puffs twice daily. She is also Nucala monthly. She does feel that it is helping somewhat. But she cannot tell the full extent with the pollen symptoms. She just recently got nebulizer tubing. She uses her rescue inhaler twice daily. Prior to this, it was a lot more.   Allergic Rhinitis Symptom History: She has issues with the tree pollen. She has Zyrtec. She also uses on one of the nose sprays. She likes the azelstatine one more than the fluticasone. She is only using the azelastine for the most part.    She has been waking up at 3am to get a head start on the day. She typically does to be around 7pm. She does not live by herself.   Bilateral cataract surgery in January and February.  She has mostly recovered well from that.  Otherwise, there have been no changes to her past medical history, surgical history, family history, or social history.    Review of Systems  Constitutional: Negative.  Negative for chills, fever, malaise/fatigue and weight loss.  HENT: Positive for congestion. Negative for ear discharge, ear pain and sinus pain.   Eyes: Negative for pain, discharge and redness.  Respiratory: Positive for cough. Negative for sputum production, shortness of breath and wheezing.   Cardiovascular: Negative.  Negative for chest pain and palpitations.  Gastrointestinal: Negative for abdominal pain, constipation, diarrhea, heartburn, nausea and vomiting.  Skin: Negative.  Negative for itching and rash.  Neurological: Negative for dizziness and headaches.  Endo/Heme/Allergies: Positive for  environmental allergies. Does not bruise/bleed easily.       Objective:   Blood pressure 132/62, pulse (!) 105, temperature 98.2 F (36.8 C), resp. rate 16, height 5\' 2"  (1.575 m), weight 221 lb 9.6 oz (100.5 kg), last menstrual period 05/30/2013, SpO2 94 %. Body mass index is 40.53 kg/m.   Physical Exam:  Physical Exam Constitutional:      Appearance: She is well-developed.     Comments: Pleasant female.  HENT:     Head: Normocephalic and atraumatic.     Right Ear: Tympanic membrane, ear canal and external ear normal.     Left Ear: Tympanic membrane, ear canal and external ear normal.     Nose: No nasal deformity, septal deviation, mucosal edema or rhinorrhea.     Right Turbinates: Enlarged and swollen.     Left Turbinates: Enlarged and swollen.     Right Sinus: No maxillary sinus tenderness or frontal sinus tenderness.     Left Sinus: No maxillary sinus tenderness or frontal sinus tenderness.     Mouth/Throat:     Mouth: Mucous membranes are not pale and not dry.     Pharynx: Uvula midline.  Eyes:     General: Lids are normal. Allergic shiner present.        Right eye: No discharge.  Left eye: No discharge.     Conjunctiva/sclera: Conjunctivae normal.     Right eye: Right conjunctiva is not injected. No chemosis.    Left eye: Left conjunctiva is not injected. No chemosis.    Pupils: Pupils are equal, round, and reactive to light.  Cardiovascular:     Rate and Rhythm: Normal rate and regular rhythm.     Heart sounds: Normal heart sounds.  Pulmonary:     Effort: Pulmonary effort is normal. No tachypnea, accessory muscle usage or respiratory distress.     Breath sounds: Normal breath sounds. No wheezing, rhonchi or rales.     Comments: Decreased air movement at the bases, but all in all moving air well in the upper fields.  No overt crackles noted. Chest:     Chest wall: No tenderness.  Lymphadenopathy:     Cervical: No cervical adenopathy.  Skin:    General:  Skin is warm.     Capillary Refill: Capillary refill takes less than 2 seconds.     Coloration: Skin is not pale.     Findings: No abrasion, erythema, petechiae or rash. Rash is not papular, urticarial or vesicular.     Comments: No eczematous or urticarial lesions noted.  Neurological:     Mental Status: She is alert.  Psychiatric:        Behavior: Behavior is cooperative.      Diagnostic studies:    Spirometry: results abnormal (FEV1: 0.86/45%, FVC: 1.22/50%, FEV1/FVC: 70%).    Spirometry consistent with possible restrictive disease.   Allergy Studies: none       Malachi Bonds, MD  [facility] [place]

## 2020-05-20 ENCOUNTER — Other Ambulatory Visit (HOSPITAL_COMMUNITY): Payer: Medicare Other

## 2020-05-21 ENCOUNTER — Other Ambulatory Visit: Payer: Self-pay | Admitting: *Deleted

## 2020-05-21 MED ORDER — ALBUTEROL SULFATE (2.5 MG/3ML) 0.083% IN NEBU: 2.5000 mg | INHALATION_SOLUTION | Freq: Four times a day (QID) | RESPIRATORY_TRACT | 1 refills | Status: DC | PRN

## 2020-06-02 DIAGNOSIS — E1122 Type 2 diabetes mellitus with diabetic chronic kidney disease: Secondary | ICD-10-CM | POA: Diagnosis not present

## 2020-06-02 DIAGNOSIS — N1831 Chronic kidney disease, stage 3a: Secondary | ICD-10-CM | POA: Diagnosis not present

## 2020-06-02 DIAGNOSIS — K219 Gastro-esophageal reflux disease without esophagitis: Secondary | ICD-10-CM | POA: Diagnosis not present

## 2020-06-02 DIAGNOSIS — M5030 Other cervical disc degeneration, unspecified cervical region: Secondary | ICD-10-CM | POA: Diagnosis not present

## 2020-06-04 DIAGNOSIS — I129 Hypertensive chronic kidney disease with stage 1 through stage 4 chronic kidney disease, or unspecified chronic kidney disease: Secondary | ICD-10-CM | POA: Diagnosis not present

## 2020-06-08 ENCOUNTER — Telehealth: Payer: Self-pay | Admitting: *Deleted

## 2020-06-08 NOTE — Telephone Encounter (Signed)
Patient called and advised problem with [employer] sending her next Nucala.  I asked if she had new Ins and she advised she did and was already scanned in epic. [facility] for [facility]ucala sample they can give her tomorrow and I will send her new Ins and approval to [facility]

## 2020-06-09 ENCOUNTER — Other Ambulatory Visit: Payer: Self-pay

## 2020-06-09 ENCOUNTER — Telehealth: Payer: Self-pay

## 2020-06-09 ENCOUNTER — Ambulatory Visit (INDEPENDENT_AMBULATORY_CARE_PROVIDER_SITE_OTHER): Payer: Medicare HMO | Admitting: *Deleted

## 2020-06-09 DIAGNOSIS — J4540 Moderate persistent asthma, uncomplicated: Secondary | ICD-10-CM

## 2020-06-09 DIAGNOSIS — J4550 Severe persistent asthma, uncomplicated: Secondary | ICD-10-CM | POA: Diagnosis not present

## 2020-06-09 NOTE — Telephone Encounter (Signed)
Spoke with patient and she would like to have a parking sticker due to her asthma and getting winded with walking long distances. Forms have been signed and ready at [facility] front desk. Pt is aware.

## 2020-06-09 NOTE — Telephone Encounter (Signed)
Patient called today requesting to speak to one of Dr Nino Glow nurses. I asked what it was in regards to & the patient states she will talk to the nurse when someone calls her back.   Please Advise.

## 2020-06-14 DIAGNOSIS — R0789 Other chest pain: Secondary | ICD-10-CM | POA: Diagnosis not present

## 2020-06-14 DIAGNOSIS — E119 Type 2 diabetes mellitus without complications: Secondary | ICD-10-CM | POA: Diagnosis not present

## 2020-06-14 DIAGNOSIS — R918 Other nonspecific abnormal finding of lung field: Secondary | ICD-10-CM | POA: Diagnosis not present

## 2020-06-14 DIAGNOSIS — Z7951 Long term (current) use of inhaled steroids: Secondary | ICD-10-CM | POA: Diagnosis not present

## 2020-06-14 DIAGNOSIS — R69 Illness, unspecified: Secondary | ICD-10-CM | POA: Diagnosis not present

## 2020-06-14 DIAGNOSIS — Z20822 Contact with and (suspected) exposure to covid-19: Secondary | ICD-10-CM | POA: Diagnosis not present

## 2020-06-14 DIAGNOSIS — Z79899 Other long term (current) drug therapy: Secondary | ICD-10-CM | POA: Diagnosis not present

## 2020-06-14 DIAGNOSIS — R079 Chest pain, unspecified: Secondary | ICD-10-CM | POA: Diagnosis not present

## 2020-06-14 DIAGNOSIS — Z7984 Long term (current) use of oral hypoglycemic drugs: Secondary | ICD-10-CM | POA: Diagnosis not present

## 2020-06-14 DIAGNOSIS — I10 Essential (primary) hypertension: Secondary | ICD-10-CM | POA: Diagnosis not present

## 2020-06-18 ENCOUNTER — Telehealth: Payer: Self-pay

## 2020-06-18 NOTE — Telephone Encounter (Signed)
Spoke with patient and she wanted to know if she should start the Singulair 10 mg for her allergies. I didn't see where Dr. Dellis Anes gave it to her at her last OV. She said she think she got it from her PCP. She is going to try it along with her other allergy medication. If it's not helping her itchy eyes, runny nose and sneezing she will call back Monday.

## 2020-06-18 NOTE — Telephone Encounter (Signed)
Patient called to speak to a nurse. She states she isn't feeling well and doesn't want to repeat herself again to the nurse.  Please Advise.  Thanks

## 2020-07-02 DIAGNOSIS — I129 Hypertensive chronic kidney disease with stage 1 through stage 4 chronic kidney disease, or unspecified chronic kidney disease: Secondary | ICD-10-CM | POA: Diagnosis not present

## 2020-07-02 DIAGNOSIS — D649 Anemia, unspecified: Secondary | ICD-10-CM | POA: Diagnosis not present

## 2020-07-02 DIAGNOSIS — K219 Gastro-esophageal reflux disease without esophagitis: Secondary | ICD-10-CM | POA: Diagnosis not present

## 2020-07-02 DIAGNOSIS — J4540 Moderate persistent asthma, uncomplicated: Secondary | ICD-10-CM | POA: Diagnosis not present

## 2020-07-02 DIAGNOSIS — E7800 Pure hypercholesterolemia, unspecified: Secondary | ICD-10-CM | POA: Diagnosis not present

## 2020-07-02 DIAGNOSIS — E1122 Type 2 diabetes mellitus with diabetic chronic kidney disease: Secondary | ICD-10-CM | POA: Diagnosis not present

## 2020-07-06 DIAGNOSIS — Z7984 Long term (current) use of oral hypoglycemic drugs: Secondary | ICD-10-CM | POA: Diagnosis not present

## 2020-07-06 DIAGNOSIS — E1122 Type 2 diabetes mellitus with diabetic chronic kidney disease: Secondary | ICD-10-CM | POA: Diagnosis not present

## 2020-07-07 ENCOUNTER — Ambulatory Visit (INDEPENDENT_AMBULATORY_CARE_PROVIDER_SITE_OTHER): Payer: Medicare HMO | Admitting: *Deleted

## 2020-07-07 ENCOUNTER — Other Ambulatory Visit: Payer: Self-pay

## 2020-07-07 DIAGNOSIS — J4540 Moderate persistent asthma, uncomplicated: Secondary | ICD-10-CM

## 2020-07-07 DIAGNOSIS — J4550 Severe persistent asthma, uncomplicated: Secondary | ICD-10-CM

## 2020-07-16 DIAGNOSIS — M109 Gout, unspecified: Secondary | ICD-10-CM | POA: Diagnosis not present

## 2020-07-16 DIAGNOSIS — L0291 Cutaneous abscess, unspecified: Secondary | ICD-10-CM | POA: Diagnosis not present

## 2020-07-16 DIAGNOSIS — R350 Frequency of micturition: Secondary | ICD-10-CM | POA: Diagnosis not present

## 2020-07-16 DIAGNOSIS — Z23 Encounter for immunization: Secondary | ICD-10-CM | POA: Diagnosis not present

## 2020-07-31 DIAGNOSIS — M1711 Unilateral primary osteoarthritis, right knee: Secondary | ICD-10-CM | POA: Diagnosis not present

## 2020-07-31 DIAGNOSIS — N1831 Chronic kidney disease, stage 3a: Secondary | ICD-10-CM | POA: Diagnosis not present

## 2020-07-31 DIAGNOSIS — K219 Gastro-esophageal reflux disease without esophagitis: Secondary | ICD-10-CM | POA: Diagnosis not present

## 2020-07-31 DIAGNOSIS — E7800 Pure hypercholesterolemia, unspecified: Secondary | ICD-10-CM | POA: Diagnosis not present

## 2020-07-31 DIAGNOSIS — D649 Anemia, unspecified: Secondary | ICD-10-CM | POA: Diagnosis not present

## 2020-07-31 DIAGNOSIS — J4540 Moderate persistent asthma, uncomplicated: Secondary | ICD-10-CM | POA: Diagnosis not present

## 2020-07-31 DIAGNOSIS — E1122 Type 2 diabetes mellitus with diabetic chronic kidney disease: Secondary | ICD-10-CM | POA: Diagnosis not present

## 2020-07-31 DIAGNOSIS — I129 Hypertensive chronic kidney disease with stage 1 through stage 4 chronic kidney disease, or unspecified chronic kidney disease: Secondary | ICD-10-CM | POA: Diagnosis not present

## 2020-07-31 DIAGNOSIS — M170 Bilateral primary osteoarthritis of knee: Secondary | ICD-10-CM | POA: Diagnosis not present

## 2020-08-04 ENCOUNTER — Ambulatory Visit: Payer: Self-pay

## 2020-08-04 DIAGNOSIS — I10 Essential (primary) hypertension: Secondary | ICD-10-CM | POA: Diagnosis not present

## 2020-08-05 DIAGNOSIS — L0291 Cutaneous abscess, unspecified: Secondary | ICD-10-CM | POA: Diagnosis not present

## 2020-08-05 DIAGNOSIS — Z6841 Body Mass Index (BMI) 40.0 and over, adult: Secondary | ICD-10-CM | POA: Diagnosis not present

## 2020-08-05 DIAGNOSIS — R69 Illness, unspecified: Secondary | ICD-10-CM | POA: Diagnosis not present

## 2020-08-29 ENCOUNTER — Other Ambulatory Visit: Payer: Self-pay | Admitting: Allergy & Immunology

## 2020-08-31 ENCOUNTER — Telehealth: Payer: Self-pay | Admitting: *Deleted

## 2020-08-31 ENCOUNTER — Other Ambulatory Visit: Payer: Self-pay | Admitting: *Deleted

## 2020-08-31 MED ORDER — ALBUTEROL SULFATE (2.5 MG/3ML) 0.083% IN NEBU: 2.5000 mg | INHALATION_SOLUTION | Freq: Four times a day (QID) | RESPIRATORY_TRACT | 1 refills | Status: DC | PRN

## 2020-08-31 MED ORDER — DULERA 200-5 MCG/ACT IN AERO
2.0000 | INHALATION_SPRAY | Freq: Two times a day (BID) | RESPIRATORY_TRACT | 1 refills | Status: DC
Start: 2020-08-31 — End: 2021-02-16

## 2020-08-31 MED ORDER — VENTOLIN HFA 108 (90 BASE) MCG/ACT IN AERS: INHALATION_SPRAY | RESPIRATORY_TRACT | 1 refills | Status: DC

## 2020-08-31 NOTE — Telephone Encounter (Signed)
PA was denied stating that preferred alternatives are Luz Lex, Symbicort, and Trelegy. Please advise change in inhaler. Thank You.

## 2020-08-31 NOTE — Telephone Encounter (Signed)
This is a duplicate order albuterol has been sent in today 08/31/20.

## 2020-08-31 NOTE — Telephone Encounter (Signed)
PA has been submitted through CoverMyMeds for Dulera and is currently pending approval/denial.  °

## 2020-09-01 ENCOUNTER — Other Ambulatory Visit: Payer: Self-pay | Admitting: *Deleted

## 2020-09-01 ENCOUNTER — Other Ambulatory Visit: Payer: Self-pay | Admitting: Family Medicine

## 2020-09-01 DIAGNOSIS — Z1231 Encounter for screening mammogram for malignant neoplasm of breast: Secondary | ICD-10-CM

## 2020-09-01 MED ORDER — BREZTRI AEROSPHERE 160-9-4.8 MCG/ACT IN AERO: 2.0000 | INHALATION_SPRAY | Freq: Two times a day (BID) | RESPIRATORY_TRACT | 1 refills | Status: DC

## 2020-09-01 NOTE — Telephone Encounter (Signed)
Called and spoke to the patient and advised of change in inhaler. Patient became very confused as to who she was speaking to because she said that on her caller ID it stated I was calling her from [place]. I advised that I was with Dr. Ellouise Newer [facility]. She wanted to know my name and I told her it was Shemiah Rosch, she said that I did not sound like the Dhilan Brauer that she speaks to and see and I advised that there are to of [place]. She stated that one Don Tiu gives good injections and the other one does not. I advised from looking at her encounters that I am the Taniesha Glanz that gives her her Nucala injections. She states that it doesn't sound like me. Then she stated that she doesn't want to come and see [place] anymore because she feels like things are always changing and she is not happy. She feels that [facility] is always changing things and she wants to go see an Ear N[facility] and Throat doctor that is not [place]. I advised why I changed the inhaler due to insurance and she stated that she has been having this issue and maybe she needs to change insurances but she still seemed to have her mind made up that she was not coming back to [facility]. I asked if Dr. Dellis Anes could give her a call and speak with her to determine maybe see what we can do to help her. She said yes. Do you mind calling the patient just to go over things? I was very confused by everything she was saying to be honest.

## 2020-09-02 NOTE — Telephone Encounter (Signed)
Called and LVM on the patient's phone. Maybe we can reach out tomorrow or next week?   Malachi Bonds, MD [facility]

## 2020-09-03 DIAGNOSIS — E7800 Pure hypercholesterolemia, unspecified: Secondary | ICD-10-CM | POA: Diagnosis not present

## 2020-09-03 DIAGNOSIS — M170 Bilateral primary osteoarthritis of knee: Secondary | ICD-10-CM | POA: Diagnosis not present

## 2020-09-03 DIAGNOSIS — I129 Hypertensive chronic kidney disease with stage 1 through stage 4 chronic kidney disease, or unspecified chronic kidney disease: Secondary | ICD-10-CM | POA: Diagnosis not present

## 2020-09-03 DIAGNOSIS — K219 Gastro-esophageal reflux disease without esophagitis: Secondary | ICD-10-CM | POA: Diagnosis not present

## 2020-09-03 DIAGNOSIS — N1831 Chronic kidney disease, stage 3a: Secondary | ICD-10-CM | POA: Diagnosis not present

## 2020-09-03 DIAGNOSIS — M1711 Unilateral primary osteoarthritis, right knee: Secondary | ICD-10-CM | POA: Diagnosis not present

## 2020-09-03 DIAGNOSIS — J4540 Moderate persistent asthma, uncomplicated: Secondary | ICD-10-CM | POA: Diagnosis not present

## 2020-09-03 DIAGNOSIS — D649 Anemia, unspecified: Secondary | ICD-10-CM | POA: Diagnosis not present

## 2020-09-03 DIAGNOSIS — E1122 Type 2 diabetes mellitus with diabetic chronic kidney disease: Secondary | ICD-10-CM | POA: Diagnosis not present

## 2020-09-10 NOTE — Telephone Encounter (Signed)
Do you think it might be better if you were to call the patient when you get the chance? Just wondering since she seemed very confused when I called her.

## 2020-09-11 DIAGNOSIS — H938X1 Other specified disorders of right ear: Secondary | ICD-10-CM | POA: Diagnosis not present

## 2020-09-11 DIAGNOSIS — E1122 Type 2 diabetes mellitus with diabetic chronic kidney disease: Secondary | ICD-10-CM | POA: Diagnosis not present

## 2020-09-11 DIAGNOSIS — R109 Unspecified abdominal pain: Secondary | ICD-10-CM | POA: Diagnosis not present

## 2020-09-11 NOTE — Telephone Encounter (Signed)
Placed Breztri samples and co-pay card at the front desk.

## 2020-09-11 NOTE — Telephone Encounter (Signed)
I called the patient back to let her know we were changing the [facility]. She was fine with that change.  I told her she can pick up a sample in [place].  Please get a sample for her and keep it at the front desk.  Can we also send in the [place] with a co-pay card?  Malachi Bonds, MD [facility] [place] [facility]

## 2020-09-22 DIAGNOSIS — E785 Hyperlipidemia, unspecified: Secondary | ICD-10-CM | POA: Diagnosis not present

## 2020-09-22 DIAGNOSIS — R69 Illness, unspecified: Secondary | ICD-10-CM | POA: Diagnosis not present

## 2020-09-22 DIAGNOSIS — M109 Gout, unspecified: Secondary | ICD-10-CM | POA: Diagnosis not present

## 2020-09-22 DIAGNOSIS — K219 Gastro-esophageal reflux disease without esophagitis: Secondary | ICD-10-CM | POA: Diagnosis not present

## 2020-09-22 DIAGNOSIS — J45909 Unspecified asthma, uncomplicated: Secondary | ICD-10-CM | POA: Diagnosis not present

## 2020-09-22 DIAGNOSIS — E119 Type 2 diabetes mellitus without complications: Secondary | ICD-10-CM | POA: Diagnosis not present

## 2020-09-22 DIAGNOSIS — N3281 Overactive bladder: Secondary | ICD-10-CM | POA: Diagnosis not present

## 2020-09-22 DIAGNOSIS — I10 Essential (primary) hypertension: Secondary | ICD-10-CM | POA: Diagnosis not present

## 2020-09-22 DIAGNOSIS — M1990 Unspecified osteoarthritis, unspecified site: Secondary | ICD-10-CM | POA: Diagnosis not present

## 2020-09-25 DIAGNOSIS — N898 Other specified noninflammatory disorders of vagina: Secondary | ICD-10-CM | POA: Diagnosis not present

## 2020-09-25 DIAGNOSIS — M542 Cervicalgia: Secondary | ICD-10-CM | POA: Diagnosis not present

## 2020-09-25 DIAGNOSIS — Z6841 Body Mass Index (BMI) 40.0 and over, adult: Secondary | ICD-10-CM | POA: Diagnosis not present

## 2020-09-25 DIAGNOSIS — H9201 Otalgia, right ear: Secondary | ICD-10-CM | POA: Diagnosis not present

## 2020-10-09 DIAGNOSIS — Z1389 Encounter for screening for other disorder: Secondary | ICD-10-CM | POA: Diagnosis not present

## 2020-10-09 DIAGNOSIS — Z0000 Encounter for general adult medical examination without abnormal findings: Secondary | ICD-10-CM | POA: Diagnosis not present

## 2020-10-09 DIAGNOSIS — Z79899 Other long term (current) drug therapy: Secondary | ICD-10-CM | POA: Diagnosis not present

## 2020-10-09 DIAGNOSIS — E1122 Type 2 diabetes mellitus with diabetic chronic kidney disease: Secondary | ICD-10-CM | POA: Diagnosis not present

## 2020-10-20 ENCOUNTER — Other Ambulatory Visit: Payer: Self-pay

## 2020-10-20 ENCOUNTER — Ambulatory Visit (INDEPENDENT_AMBULATORY_CARE_PROVIDER_SITE_OTHER): Payer: Medicare HMO | Admitting: *Deleted

## 2020-10-20 DIAGNOSIS — J4550 Severe persistent asthma, uncomplicated: Secondary | ICD-10-CM | POA: Diagnosis not present

## 2020-10-20 DIAGNOSIS — J4540 Moderate persistent asthma, uncomplicated: Secondary | ICD-10-CM

## 2020-10-23 ENCOUNTER — Ambulatory Visit
Admission: RE | Admit: 2020-10-23 | Discharge: 2020-10-23 | Disposition: A | Discharge status: Home / Self Care | Payer: Medicare HMO | Source: Ambulatory Visit | Attending: Family Medicine | Admitting: Family Medicine

## 2020-10-23 ENCOUNTER — Other Ambulatory Visit: Payer: Self-pay

## 2020-10-23 DIAGNOSIS — Z1231 Encounter for screening mammogram for malignant neoplasm of breast: Secondary | ICD-10-CM | POA: Diagnosis not present

## 2020-10-26 ENCOUNTER — Telehealth: Payer: Self-pay | Admitting: Allergy & Immunology

## 2020-10-26 NOTE — Telephone Encounter (Signed)
Patient called requesting to talk to Chrisman F.  She did not want me to send a message to anyone else. I asked her what this was in regards to and she asked me why I needed to know that. I told her I had to document it in the telephone contact. All she said was asthma.

## 2020-10-26 NOTE — Telephone Encounter (Signed)
Attempted to call patient and it went straight to voicemail. Left a voicemail asking for patient to return call. Will attempt to call tomorrow.

## 2020-10-27 ENCOUNTER — Other Ambulatory Visit: Payer: Self-pay | Admitting: *Deleted

## 2020-10-27 MED ORDER — ALBUTEROL SULFATE HFA 108 (90 BASE) MCG/ACT IN AERS: 2.0000 | INHALATION_SPRAY | RESPIRATORY_TRACT | 1 refills | Status: DC | PRN

## 2020-10-29 NOTE — Telephone Encounter (Signed)
Attempted to call patient but it went straight to voicemail. Left voicemail for patient to return call to discuss.

## 2020-11-05 DIAGNOSIS — I10 Essential (primary) hypertension: Secondary | ICD-10-CM | POA: Diagnosis not present

## 2020-11-05 DIAGNOSIS — N189 Chronic kidney disease, unspecified: Secondary | ICD-10-CM | POA: Diagnosis not present

## 2020-11-05 DIAGNOSIS — Z6838 Body mass index (BMI) 38.0-38.9, adult: Secondary | ICD-10-CM | POA: Diagnosis not present

## 2020-11-05 DIAGNOSIS — E1169 Type 2 diabetes mellitus with other specified complication: Secondary | ICD-10-CM | POA: Diagnosis not present

## 2020-11-05 DIAGNOSIS — E785 Hyperlipidemia, unspecified: Secondary | ICD-10-CM | POA: Diagnosis not present

## 2020-11-12 DIAGNOSIS — E1169 Type 2 diabetes mellitus with other specified complication: Secondary | ICD-10-CM | POA: Diagnosis not present

## 2020-11-12 DIAGNOSIS — E785 Hyperlipidemia, unspecified: Secondary | ICD-10-CM | POA: Diagnosis not present

## 2020-11-17 ENCOUNTER — Ambulatory Visit (INDEPENDENT_AMBULATORY_CARE_PROVIDER_SITE_OTHER): Payer: Medicare HMO | Admitting: *Deleted

## 2020-11-17 ENCOUNTER — Telehealth: Payer: Self-pay | Admitting: *Deleted

## 2020-11-17 ENCOUNTER — Other Ambulatory Visit: Payer: Self-pay

## 2020-11-17 DIAGNOSIS — J4550 Severe persistent asthma, uncomplicated: Secondary | ICD-10-CM | POA: Diagnosis not present

## 2020-11-17 DIAGNOSIS — J4540 Moderate persistent asthma, uncomplicated: Secondary | ICD-10-CM

## 2020-11-17 NOTE — Telephone Encounter (Signed)
Patient came in and requested a new Handicap Placard as the current one is about to expire at the end of this month. Application has been filled out and placed in Dr. Ellouise Newer office for him to review and sign.

## 2020-11-19 DIAGNOSIS — I10 Essential (primary) hypertension: Secondary | ICD-10-CM | POA: Diagnosis not present

## 2020-11-19 DIAGNOSIS — N189 Chronic kidney disease, unspecified: Secondary | ICD-10-CM | POA: Diagnosis not present

## 2020-11-19 DIAGNOSIS — E1169 Type 2 diabetes mellitus with other specified complication: Secondary | ICD-10-CM | POA: Diagnosis not present

## 2020-11-19 DIAGNOSIS — Z6838 Body mass index (BMI) 38.0-38.9, adult: Secondary | ICD-10-CM | POA: Diagnosis not present

## 2020-11-19 DIAGNOSIS — E785 Hyperlipidemia, unspecified: Secondary | ICD-10-CM | POA: Diagnosis not present

## 2020-11-19 NOTE — Telephone Encounter (Signed)
Patient and Dr. Dellis Anes discussed completing the application for 5 years duration. A new form has been filled out and placed in Dr. Ellouise Newer office for him to sign.

## 2020-11-25 NOTE — Telephone Encounter (Signed)
Patient came in today to pick up her forms that were forms that were completed. Patient asked to speak with staff due to her not being happy with the [facility] as she has seen on advertisement  is only for COPD. Patient expressed that she would like another inhaler that is specifically will help with her asthma. Please advise on an inhaler that will help with her asthma and something that her insurance will cover. Thank you.

## 2020-11-25 NOTE — Telephone Encounter (Signed)
Patient came in and picked up forms.

## 2020-11-25 NOTE — Telephone Encounter (Signed)
Called and informed patient that her form was ready to be picked up. Patient expressed that she would pick up the forms from the [place] office.

## 2020-11-26 NOTE — Telephone Encounter (Signed)
Called and left a message for patient informing her that the new papers were completed with her correct address on them. Papers will be left in the front of the office in [place].

## 2020-11-30 NOTE — Telephone Encounter (Signed)
Patient picked up forms.

## 2020-12-02 DIAGNOSIS — E782 Mixed hyperlipidemia: Secondary | ICD-10-CM | POA: Diagnosis not present

## 2020-12-02 DIAGNOSIS — M159 Polyosteoarthritis, unspecified: Secondary | ICD-10-CM | POA: Diagnosis not present

## 2020-12-02 DIAGNOSIS — E1122 Type 2 diabetes mellitus with diabetic chronic kidney disease: Secondary | ICD-10-CM | POA: Diagnosis not present

## 2020-12-02 DIAGNOSIS — N1831 Chronic kidney disease, stage 3a: Secondary | ICD-10-CM | POA: Diagnosis not present

## 2020-12-02 DIAGNOSIS — I10 Essential (primary) hypertension: Secondary | ICD-10-CM | POA: Diagnosis not present

## 2020-12-10 DIAGNOSIS — E1169 Type 2 diabetes mellitus with other specified complication: Secondary | ICD-10-CM | POA: Diagnosis not present

## 2020-12-10 DIAGNOSIS — E785 Hyperlipidemia, unspecified: Secondary | ICD-10-CM | POA: Diagnosis not present

## 2020-12-10 DIAGNOSIS — I10 Essential (primary) hypertension: Secondary | ICD-10-CM | POA: Diagnosis not present

## 2020-12-10 DIAGNOSIS — N189 Chronic kidney disease, unspecified: Secondary | ICD-10-CM | POA: Diagnosis not present

## 2020-12-10 DIAGNOSIS — Z6838 Body mass index (BMI) 38.0-38.9, adult: Secondary | ICD-10-CM | POA: Diagnosis not present

## 2020-12-11 ENCOUNTER — Other Ambulatory Visit: Payer: Self-pay | Admitting: *Deleted

## 2020-12-15 ENCOUNTER — Ambulatory Visit: Payer: Medicare HMO

## 2020-12-16 ENCOUNTER — Other Ambulatory Visit: Payer: Self-pay

## 2020-12-18 ENCOUNTER — Other Ambulatory Visit: Payer: Self-pay | Admitting: *Deleted

## 2020-12-22 ENCOUNTER — Ambulatory Visit (INDEPENDENT_AMBULATORY_CARE_PROVIDER_SITE_OTHER): Payer: Medicare HMO | Admitting: *Deleted

## 2020-12-22 ENCOUNTER — Telehealth: Payer: Self-pay

## 2020-12-22 ENCOUNTER — Other Ambulatory Visit: Payer: Self-pay

## 2020-12-22 DIAGNOSIS — J4540 Moderate persistent asthma, uncomplicated: Secondary | ICD-10-CM

## 2020-12-22 NOTE — Telephone Encounter (Signed)
Patient stated her pharmacy had reached out to [place] to refill for singulair. I did let patient know that her refill request has been denied since she has not been seen since 05/2020. I did advise patient that she would need an appointment before any refills would be sent in. Patient stated she would call us when she has her calendar.

## 2021-01-07 DIAGNOSIS — E785 Hyperlipidemia, unspecified: Secondary | ICD-10-CM | POA: Diagnosis not present

## 2021-01-07 DIAGNOSIS — Z6838 Body mass index (BMI) 38.0-38.9, adult: Secondary | ICD-10-CM | POA: Diagnosis not present

## 2021-01-07 DIAGNOSIS — N189 Chronic kidney disease, unspecified: Secondary | ICD-10-CM | POA: Diagnosis not present

## 2021-01-07 DIAGNOSIS — I10 Essential (primary) hypertension: Secondary | ICD-10-CM | POA: Diagnosis not present

## 2021-01-07 DIAGNOSIS — E1169 Type 2 diabetes mellitus with other specified complication: Secondary | ICD-10-CM | POA: Diagnosis not present

## 2021-01-18 ENCOUNTER — Encounter: Payer: Self-pay | Admitting: Orthopaedic Surgery

## 2021-01-18 ENCOUNTER — Encounter: Payer: Self-pay | Admitting: Podiatry

## 2021-01-18 ENCOUNTER — Telehealth: Payer: Self-pay

## 2021-01-18 ENCOUNTER — Ambulatory Visit: Payer: Self-pay

## 2021-01-18 ENCOUNTER — Ambulatory Visit: Payer: Medicare HMO | Admitting: Orthopaedic Surgery

## 2021-01-18 ENCOUNTER — Ambulatory Visit: Payer: Medicare HMO | Admitting: Podiatry

## 2021-01-18 ENCOUNTER — Other Ambulatory Visit: Payer: Self-pay

## 2021-01-18 ENCOUNTER — Ambulatory Visit (INDEPENDENT_AMBULATORY_CARE_PROVIDER_SITE_OTHER): Payer: Medicare HMO

## 2021-01-18 VITALS — Ht 62.0 in | Wt 212.0 lb

## 2021-01-18 DIAGNOSIS — M79674 Pain in right toe(s): Secondary | ICD-10-CM

## 2021-01-18 DIAGNOSIS — E119 Type 2 diabetes mellitus without complications: Secondary | ICD-10-CM | POA: Diagnosis not present

## 2021-01-18 DIAGNOSIS — M1711 Unilateral primary osteoarthritis, right knee: Secondary | ICD-10-CM | POA: Diagnosis not present

## 2021-01-18 DIAGNOSIS — M2011 Hallux valgus (acquired), right foot: Secondary | ICD-10-CM

## 2021-01-18 DIAGNOSIS — Z794 Long term (current) use of insulin: Secondary | ICD-10-CM | POA: Insufficient documentation

## 2021-01-18 DIAGNOSIS — M2012 Hallux valgus (acquired), left foot: Secondary | ICD-10-CM | POA: Diagnosis not present

## 2021-01-18 DIAGNOSIS — M25562 Pain in left knee: Secondary | ICD-10-CM

## 2021-01-18 DIAGNOSIS — M2042 Other hammer toe(s) (acquired), left foot: Secondary | ICD-10-CM | POA: Diagnosis not present

## 2021-01-18 DIAGNOSIS — L84 Corns and callosities: Secondary | ICD-10-CM | POA: Diagnosis not present

## 2021-01-18 DIAGNOSIS — E1121 Type 2 diabetes mellitus with diabetic nephropathy: Secondary | ICD-10-CM

## 2021-01-18 DIAGNOSIS — B351 Tinea unguium: Secondary | ICD-10-CM

## 2021-01-18 DIAGNOSIS — M79675 Pain in left toe(s): Secondary | ICD-10-CM

## 2021-01-18 DIAGNOSIS — G8929 Other chronic pain: Secondary | ICD-10-CM

## 2021-01-18 DIAGNOSIS — M1712 Unilateral primary osteoarthritis, left knee: Secondary | ICD-10-CM | POA: Diagnosis not present

## 2021-01-18 DIAGNOSIS — M25561 Pain in right knee: Secondary | ICD-10-CM | POA: Diagnosis not present

## 2021-01-18 DIAGNOSIS — N189 Chronic kidney disease, unspecified: Secondary | ICD-10-CM | POA: Insufficient documentation

## 2021-01-18 DIAGNOSIS — M5030 Other cervical disc degeneration, unspecified cervical region: Secondary | ICD-10-CM | POA: Insufficient documentation

## 2021-01-18 DIAGNOSIS — E785 Hyperlipidemia, unspecified: Secondary | ICD-10-CM | POA: Insufficient documentation

## 2021-01-18 DIAGNOSIS — N1831 Chronic kidney disease, stage 3a: Secondary | ICD-10-CM | POA: Insufficient documentation

## 2021-01-18 DIAGNOSIS — M1000 Idiopathic gout, unspecified site: Secondary | ICD-10-CM | POA: Insufficient documentation

## 2021-01-18 DIAGNOSIS — M2041 Other hammer toe(s) (acquired), right foot: Secondary | ICD-10-CM

## 2021-01-18 NOTE — Patient Instructions (Signed)
Diabetes Mellitus and Foot Care Foot care is an important part of your health, especially when you have diabetes. Diabetes may cause you to have problems because of poor blood flow (circulation) to your feet and legs, which can cause your skin to: Become thinner and drier. Break more easily. Heal more slowly. Peel and crack. You may also have nerve damage (neuropathy) in your legs and feet, causing decreased feeling in them. This means that you may not notice minor injuries to your feet that could lead to more serious problems. Noticing and addressing any potential problems early is the best way to prevent future foot problems. How to care for your feet Foot hygiene  Wash your feet daily with warm water and mild soap. Do not use hot water. Then, pat your feet and the areas between your toes until they are completely dry. Do not soak your feet as this can dry your skin. Trim your toenails straight across. Do not dig under them or around the cuticle. File the edges of your nails with an emery board or nail file. Apply a moisturizing lotion or petroleum jelly to the skin on your feet and to dry, brittle toenails. Use lotion that does not contain alcohol and is unscented. Do not apply lotion between your toes. Shoes and socks Wear clean socks or stockings every day. Make sure they are not too tight. Do not wear knee-high stockings since they may decrease blood flow to your legs. Wear shoes that fit properly and have enough cushioning. Always look in your shoes before you put them on to be sure there are no objects inside. To break in new shoes, wear them for just a few hours a day. This prevents injuries on your feet. Wounds, scrapes, corns, and calluses  Check your feet daily for blisters, cuts, bruises, sores, and redness. If you cannot see the bottom of your feet, use a mirror or ask someone for help. Do not cut corns or calluses or try to remove them with medicine. If you find a minor scrape,  cut, or break in the skin on your feet, keep it and the skin around it clean and dry. You may clean these areas with mild soap and water. Do not clean the area with peroxide, alcohol, or iodine. If you have a wound, scrape, corn, or callus on your foot, look at it several times a day to make sure it is healing and not infected. Check for: Redness, swelling, or pain. Fluid or blood. Warmth. Pus or a bad smell. General tips Do not cross your legs. This may decrease blood flow to your feet. Do not use heating pads or hot water bottles on your feet. They may burn your skin. If you have lost feeling in your feet or legs, you may not know this is happening until it is too late. Protect your feet from hot and cold by wearing shoes, such as at the beach or on hot pavement. Schedule a complete foot exam at least once a year (annually) or more often if you have foot problems. Report any cuts, sores, or bruises to your health care provider immediately. Where to find more information American Diabetes Association: www.diabetes.org Association of Diabetes Care & Education Specialists: www.diabeteseducator.org Contact a health care provider if: You have a medical condition that increases your risk of infection and you have any cuts, sores, or bruises on your feet. You have an injury that is not healing. You have redness on your legs or feet. You   feel burning or tingling in your legs or feet. You have pain or cramps in your legs and feet. Your legs or feet are numb. Your feet always feel cold. You have pain around any toenails. Get help right away if: You have a wound, scrape, corn, or callus on your foot and: You have pain, swelling, or redness that gets worse. You have fluid or blood coming from the wound, scrape, corn, or callus. Your wound, scrape, corn, or callus feels warm to the touch. You have pus or a bad smell coming from the wound, scrape, corn, or callus. You have a fever. You have a red  line going up your leg. Summary Check your feet every day for blisters, cuts, bruises, sores, and redness. Apply a moisturizing lotion or petroleum jelly to the skin on your feet and to dry, brittle toenails. Wear shoes that fit properly and have enough cushioning. If you have foot problems, report any cuts, sores, or bruises to your health care provider immediately. Schedule a complete foot exam at least once a year (annually) or more often if you have foot problems. This information is not intended to replace advice given to you by your health care provider. Make sure you discuss any questions you have with your health care provider. Document Revised: 09/12/2019 Document Reviewed: 09/12/2019 Elsevier Patient Education  [place] are small areas of thickened skin that form on the top, sides, or tip of a toe. Corns have a cone-shaped core with a point that can press on a nerve below. This causes pain. Calluses are areas of thickened skin that can form anywhere on the body, including the hands, fingers, palms, soles of the feet, and heels. Calluses are usually larger than corns. What are the causes? Corns and calluses are caused by rubbing (friction) or pressure, such as from shoes that are too tight or do not fit properly. What increases the risk? Corns are more likely to develop in people who have misshapen toes (toe deformities), such as hammer toes. Calluses can form with friction to any area of the skin. They are more likely to develop in people who: Work with their hands. Wear shoes that fit poorly, are too tight, or are high-heeled. Have toe deformities. What are the signs or symptoms? Symptoms of a corn or callus include: A hard growth on the skin. Pain or tenderness under the skin. Redness and swelling. Increased discomfort while wearing tight-fitting shoes, if your feet are affected. If a corn or callus becomes infected, symptoms may  include: Redness and swelling that gets worse. Pain. Fluid, blood, or pus draining from the corn or callus. How is this diagnosed? Corns and calluses may be diagnosed based on your symptoms, your medical history, and a physical exam. How is this treated? Treatment for corns and calluses may include: Removing the cause of the friction or pressure. This may involve: Changing your shoes. Wearing shoe inserts (orthotics) or other protective layers in your shoes, such as a corn pad. Wearing gloves. Applying medicine to the skin (topical medicine) to help soften skin in the hardened, thickened areas. Removing layers of dead skin with a file to reduce the size of the corn or callus. Removing the corn or callus with a scalpel or laser. Taking antibiotic medicines, if your corn or callus is infected. Having surgery, if a toe deformity is the cause. Follow these instructions at home:  Take over-the-counter and prescription medicines only as told by your health  care provider. If you were prescribed an antibiotic medicine, take it as told by your health care provider. Do not stop taking it even if your condition improves. Wear shoes that fit well. Avoid wearing high-heeled shoes and shoes that are too tight or too loose. Wear any padding, protective layers, gloves, or orthotics as told by your health care provider. Soak your hands or feet. Then use a file or pumice stone to soften your corn or callus. Do this as told by your health care provider. Check your corn or callus every day for signs of infection. Contact a health care provider if: Your symptoms do not improve with treatment. You have redness or swelling that gets worse. Your corn or callus becomes painful. You have fluid, blood, or pus coming from your corn or callus. You have new symptoms. Get help right away if: You develop severe pain with redness. Summary Corns are small areas of thickened skin that form on the top, sides, or tip  of a toe. These can be painful. Calluses are areas of thickened skin that can form anywhere on the body, including the hands, fingers, palms, and soles of the feet. Calluses are usually larger than corns. Corns and calluses are caused by rubbing (friction) or pressure, such as from shoes that are too tight or do not fit properly. Treatment may include wearing padding, protective layers, gloves, or orthotics as told by your health care provider. This information is not intended to replace advice given to you by your health care provider. Make sure you discuss any questions you have with your health care provider. Document Revised: 06/20/2019 Document Reviewed: 06/20/2019 Elsevier Patient Education  2022 Elsevier Inc.  Bunion A bunion (hallux valgus) is a bump that forms slowly on the inner side of the big toe joint. It occurs when the big toe turns toward the second toe. Bunions may be small at first, but they often get larger over time. They can make walking painful. What are the causes? This condition may be caused by: Wearing narrow or pointed shoes that force the big toe to press against the other toes. Abnormal foot development that causes the foot to roll inward. Changes in the foot that are caused by certain diseases, such as rheumatoid arthritis or polio. A foot injury. What increases the risk? The following factors may make you more likely to develop this condition: Wearing shoes that squeeze the toes together. Having certain diseases, such as: Rheumatoid arthritis. Polio. Cerebral palsy. Having family members who have bunions. Being born with abnormally shaped feet (a foot deformity), such as flat feet or low arches. Doing activities that put a lot of pressure on the feet, such as ballet dancing. What are the signs or symptoms? The main symptom of this condition is a bump on your big toe that you can notice. Other symptoms may include: Pain. Redness and inflammation around  your big toe. Thick or hardened skin on your big toe or between your toes. Stiffness or loss of motion in your big toe. Trouble with walking. How is this diagnosed? This condition may be diagnosed based on your symptoms, medical history, and activities. You may also have tests and imaging, such as: X-rays. These allow your health care provider to check the position of the bones in your foot and look for damage to your joint. They also help your health care provider determine the severity of your bunion and the best way to treat it. Joint aspiration. In this test, a sample  of fluid is removed from the toe joint. This test may be done if you are in a lot of pain. It helps rule out diseases that cause painful swelling of the joints, such as arthritis or gout. How is this treated? Treatment depends on the severity of your symptoms. The goal of treatment is to relieve symptoms and prevent your bunion from getting worse. Your health care provider may recommend: Wearing shoes that have a wide toe box, or using bunion pads to cushion the affected area. Taping your toes together to keep them in a normal position. Placing a device inside your shoe (orthotic device) to help reduce pressure on your toe joint. Taking medicine to ease pain and inflammation. Putting ice or heat on the affected area. Doing stretching exercises. Surgery, for severe cases. Follow these instructions at home: Managing pain, stiffness, and swelling   If directed, put ice on the painful area. To do this: Put ice in a plastic bag. Place a towel between your skin and the bag. Leave the ice on for 20 minutes, 2-3 times a day. Remove the ice if your skin turns bright red. This is very important. If you cannot feel pain, heat, or cold, you have a greater risk of damage to the area. If directed, apply heat to the affected area before you exercise. Use the heat source that your health care provider recommends, such as a moist heat pack  or a heating pad. Place a towel between your skin and the heat source. Leave the heat on for 20-30 minutes. Remove the heat if your skin turns bright red. This is especially important if you are unable to feel pain, heat, or cold. You have a greater risk of getting burned. General instructions Do exercises as told by your health care provider. Support your toe joint with proper footwear, shoe padding, or taping as told by your health care provider. Take over-the-counter and prescription medicines only as told by your health care provider. Do not use any products that contain nicotine or tobacco, such as cigarettes, e-cigarettes, and chewing tobacco. If you need help quitting, ask your health care provider. Keep all follow-up visits. This is important. Contact a health care provider if: Your symptoms get worse. Your symptoms do not improve in 2 weeks. Get help right away if: You have severe pain and trouble with walking. Summary A bunion is a bump on the inner side of the big toe joint that forms when the big toe turns toward the second toe. Bunions can make walking painful. Treatment depends on the severity of your symptoms. Support your toe joint with proper footwear, shoe padding, or taping as told by your health care provider. This information is not intended to replace advice given to you by your health care provider. Make sure you discuss any questions you have with your health care provider. Document Revised: 06/28/2019 Document Reviewed: 06/28/2019 Elsevier Patient Education  2022 [employer].

## 2021-01-18 NOTE — Telephone Encounter (Signed)
Bilateral gel injections  

## 2021-01-18 NOTE — Progress Notes (Signed)
Office Visit Note   Patient: Melissa Chavez           Date of Birth: [birth year]           MRN: [id number] Visit Date: 01/18/2021              Requested by: Sigmund Hazel, MD [address] [place],  [place] [address] PCP: Sigmund Hazel, MD   Assessment & Plan: Visit Diagnoses:  1. Chronic pain of left knee   2. Chronic pain of right knee   3. Unilateral primary osteoarthritis, right knee   4. Unilateral primary osteoarthritis, left knee     Plan: She does have significant arthritis in both knees.  We cannot provide a steroid injection in either knee today given her poor control of blood glucose and the high levels that has been read on the monitor.  The only other option would be considering hyaluronic acid for her knees.  She is also requesting this as well.  We will work on getting this ordered for both knees.  Follow-Up Instructions: No follow-ups on file.   Orders:  Orders Placed This Encounter  Procedures   XR Knee 1-2 Views Right   XR Knee 1-2 Views Left   No orders of the defined types were placed in this encounter.     Procedures: No procedures performed   Clinical Data: No additional findings.   Subjective: Chief Complaint  Patient presents with   Right Knee - Pain   Left Knee - Pain  The patient is listed as new patient because its been many years since I have seen her.  She is 60 years old and has well-documented osteoarthritis of both her knees.  She last had steroid injections in both knees in 2018.  She has been having worsening pain and swelling of both knees as of recent.  She is a diabetic.  Her blood sugars have been running high and she showed me a graph were early this morning her blood glucose was over 250.  Her last hemoglobin A1c back in January of this year that is recorded in our chart shows a hemoglobin A1c of 8.9 so her blood glucose is under poor control.  Her BMI is almost 39  HPI  Review of Systems There is currently listed no  headache, chest pain, shortness of breath, fever, chills, nausea, vomiting  Objective: Vital Signs: Ht 5\' 2"  (1.575 m)   Wt 212 lb (96.2 kg)   LMP 05/30/2013   BMI 38.78 kg/m   Physical Exam She is alert and orient x3 and in no acute distress Ortho Exam Examination of both knees shows a mild effusion of both knees.  There is varus malalignment and patellofemoral crepitation the arc of motion of both knees. Specialty Comments:  No specialty comments available.  Imaging: XR Knee 1-2 Views Left  Result Date: 01/18/2021 2 views of the left knee show severe tricompartment arthritis with large osteophytes in all 3 compartments and significant joint space narrowing of the medial compartment and patellofemoral joint.  XR Knee 1-2 Views Right  Result Date: 01/18/2021 2 views of the right knee show significant tricompartment arthritis with medial joint space narrowing and patellofemoral narrowing.  There are osteophytes in all 3 compartments.    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 09/07/2015   Prednisone adverse reaction 08/20/2015   Abdominal pain, chronic, epigastric 06/30/2015   Osteoarthritis of right foot 03/22/2015   Type 2 diabetes mellitus with diabetic polyneuropathy, without long-term current use of insulin (HCC) 03/20/2015   BMI 40.0-44.9, adult (HCC) 06/13/2013   Overactive bladder 06/13/2013   Allergic rhinitis 06/13/2013   Asthma, moderate persistent 06/13/2013   BIPOLAR DISORDER UNSPECIFIED 03/18/2008   PERIPHERAL EDEMA 07/03/2007   GERD 01/30/2007   HYPERCHOLESTEROLEMIA 12/07/2006    Essential hypertension 12/07/2006   Osteoarthritis of right knee 12/07/2006   ANEMIA, IRON DEFICIENCY 01/06/2006   Past Medical History:  Diagnosis Date   Allergy    Anemia    Asthma    Bipolar 2 disorder (HCC)    Diabetes mellitus without complication (HCC)    GERD (gastroesophageal reflux disease)    Glaucoma    Pt denies   Hernia    Hypertension     Family History  Problem Relation Age of Onset   Hypertension Mother    Diabetes Mother    Hyperlipidemia Mother    Stroke Mother    Heart disease Mother    Pulmonary embolism Mother    Hypertension Father    Diabetes Father    Hyperlipidemia Father     Past Surgical History:  Procedure Laterality Date   deviated septum surgery x 2      HERNIA REPAIR     TONSILLECTOMY     Pt denies   TUBAL LIGATION     Social History   Occupational History   Occupation: disabled    Comment: asthma  Tobacco Use   Smoking status: Never   Smokeless tobacco: Never  Substance and Sexual Activity   Alcohol use: Yes    Alcohol/week: 0.0 standard drinks    Comment: Wine, Socially    Drug use: No   Sexual activity: Never

## 2021-01-18 NOTE — Telephone Encounter (Signed)
Noted  

## 2021-01-19 ENCOUNTER — Ambulatory Visit (INDEPENDENT_AMBULATORY_CARE_PROVIDER_SITE_OTHER): Payer: Medicare HMO | Admitting: *Deleted

## 2021-01-19 ENCOUNTER — Telehealth: Payer: Self-pay | Admitting: Allergy & Immunology

## 2021-01-19 DIAGNOSIS — J4550 Severe persistent asthma, uncomplicated: Secondary | ICD-10-CM

## 2021-01-19 DIAGNOSIS — J4540 Moderate persistent asthma, uncomplicated: Secondary | ICD-10-CM

## 2021-01-19 NOTE — Telephone Encounter (Signed)
Melissa Chavez came in the office and wanted to pay on her bill.  When I asked her how much she wanted to pay, she asked how much she owed.  When I gave her the total, she stated she thought she only had to pay 5.62.  So she stated she wanted to know how much she owed all together and would pay at once.  I tried to explain that she owed 16.98 but was more than welcomed to pay the 5.62 and she said she wanted a call from someone in billing to explain her charges.

## 2021-01-21 ENCOUNTER — Telehealth: Payer: Self-pay | Admitting: Orthopaedic Surgery

## 2021-01-21 DIAGNOSIS — Z6838 Body mass index (BMI) 38.0-38.9, adult: Secondary | ICD-10-CM | POA: Diagnosis not present

## 2021-01-21 DIAGNOSIS — N189 Chronic kidney disease, unspecified: Secondary | ICD-10-CM | POA: Diagnosis not present

## 2021-01-21 DIAGNOSIS — I10 Essential (primary) hypertension: Secondary | ICD-10-CM | POA: Diagnosis not present

## 2021-01-21 DIAGNOSIS — E1169 Type 2 diabetes mellitus with other specified complication: Secondary | ICD-10-CM | POA: Diagnosis not present

## 2021-01-21 DIAGNOSIS — E785 Hyperlipidemia, unspecified: Secondary | ICD-10-CM | POA: Diagnosis not present

## 2021-01-21 NOTE — Telephone Encounter (Signed)
Patient called asked when can she get the fluid off of both of her knees? The number to contact patient is [phone]

## 2021-01-22 ENCOUNTER — Telehealth: Payer: Self-pay

## 2021-01-22 NOTE — Progress Notes (Signed)
Subjective: Melissa Chavez presents today referred by Merrilee Seashore, MD for diabetic foot evaluation.  Today, patient c/o for diabetic foot evaluation and callus(es) of both feet and painful thick toenails that are difficult to trim. Painful toenails interfere with ambulation. Aggravating factors include wearing enclosed shoe gear. Pain is relieved with periodic professional debridement. Painful calluses are aggravated when weightbearing with and without shoegear.   Patient relates 10 year history of diabetes.  Patient denies history of foot wounds. She does have h/o gout.  Patient denies any symptoms of numbness, tingling, burning or pins/needle sensation in feet.  Patient relates blood glucose was 141 mg/dl this morning.   PCP is Merrilee Seashore, MD , and last visit was 12/02/2020.  Past Medical History:  Diagnosis Date   Allergy    Anemia    Asthma    Bipolar 2 disorder ([place])    Diabetes mellitus without complication ([place])    GERD (gastroesophageal reflux disease)    Glaucoma    Pt denies   Hernia    Hypertension     Patient Active Problem List   Diagnosis Date Noted   Chronic kidney disease 01/18/2021   Chronic kidney disease, stage 3a ([place]) 01/18/2021   DDD (degenerative disc disease), cervical 01/18/2021   Diabetic renal disease ([place]) 01/18/2021   Idiopathic gout 01/18/2021   Long term (current) use of insulin ([place]) 01/18/2021   Hyperlipidemia, unspecified 01/18/2021   Morbid obesity ([place]) 01/18/2021   Unilateral primary osteoarthritis, left knee 09/13/2016   Unilateral primary osteoarthritis, right knee 09/13/2016   Pressure injury of skin 03/08/2016   Acute gout of left knee 03/07/2016   Sinus congestion 11/29/2015   Bilateral leg edema 09/25/2015   Abdominal pain, left upper quadrant 09/25/2015   DJD (degenerative joint disease) of upper arm 09/07/2015   Prednisone adverse reaction 08/20/2015   Abdominal pain, chronic, epigastric 06/30/2015    Osteoarthritis of right foot 03/22/2015   Type 2 diabetes mellitus with diabetic polyneuropathy, without long-term current use of insulin ([place]) 03/20/2015   BMI 40.0-44.9, adult ([place]) 06/13/2013   Overactive bladder 06/13/2013   Allergic rhinitis 06/13/2013   Asthma, moderate persistent 06/13/2013   BIPOLAR DISORDER UNSPECIFIED 03/18/2008   PERIPHERAL EDEMA 07/03/2007   GERD 01/30/2007   HYPERCHOLESTEROLEMIA 12/07/2006   Essential hypertension 12/07/2006   Osteoarthritis of right knee 12/07/2006   ANEMIA, IRON DEFICIENCY 01/06/2006    Past Surgical History:  Procedure Laterality Date   deviated septum surgery x 2      HERNIA REPAIR     TONSILLECTOMY     Pt denies   TUBAL LIGATION      Current Outpatient Medications on File Prior to Visit  Medication Sig Dispense Refill   EPINEPHrine 0.3 mg/0.3 mL IJ SOAJ injection ADMINISTER 0.3 MG IN THE MUSCLE 1 TIME FOR 1 DOSE     fluconazole (DIFLUCAN) 100 MG tablet 1 tablet     Insulin Pen Needle (B-D UF III MINI PEN NEEDLES) 31G X 5 MM MISC See admin instructions.     montelukast (SINGULAIR) 10 MG tablet 1 tablet     Semaglutide,0.25 or 0.5MG /DOS, (OZEMPIC, 0.25 OR 0.5 MG/DOSE,) 2 MG/1.5ML SOPN See admin instructions.     ACCU-CHEK SOFTCLIX LANCETS lancets TEST BLOOD SUGAR ONCE DAILY 100 each 0   albuterol (PROAIR HFA) 108 (90 Base) MCG/ACT inhaler Inhale 2 puffs into the lungs every 4 (four) hours as needed for wheezing or shortness of breath. 36 g 1   albuterol (PROVENTIL) (2.5 MG/3ML) 0.083% nebulizer  solution Take 3 mLs (2.5 mg total) by nebulization every 6 (six) hours as needed. 225 mL 1   azelastine (ASTELIN) 0.1 % nasal spray Place 2 sprays into both nostrils 2 (two) times daily. 30 mL 5   BEPREVE 1.5 % SOLN SMARTSIG:In Eye(s)     Blood Glucose Calibration (ACCU-CHEK AVIVA) SOLN Test blood sugar once daily. Dx: E11.42 1 each 3   blood glucose meter kit and supplies KIT Use as directed 4 times a day.  DX: R73.09 1 each 0   Blood  Glucose Monitoring Suppl (ACCU-CHEK AVIVA PLUS) w/Device KIT TEST BLOOD SUGAR ONCE  DAILY. 1 kit 0   BREZTRI AEROSPHERE 160-9-4.8 MCG/ACT AERO Inhale 2 puffs into the lungs in the morning and at bedtime. 32.1 g 1   colchicine 0.6 MG tablet Take 0.6 mg by mouth daily.     Cyanocobalamin (B-12) 1000 MCG CAPS Take 2 capsules by mouth daily.     dapagliflozin propanediol (FARXIGA) 5 MG TABS tablet 1 tablet     Difluprednate 0.05 % EMUL Instill 1 drop into left eye four times a day as directed     DULERA 200-5 MCG/ACT AERO Inhale 2 puffs into the lungs 2 (two) times daily. 39 g 1   enalapril (VASOTEC) 10 MG tablet Take 10 mg by mouth daily.      enalapril (VASOTEC) 20 MG tablet Take 20 mg by mouth daily.     febuxostat (ULORIC) 40 MG tablet Take 40 mg by mouth daily.     fluticasone (FLONASE) 50 MCG/ACT nasal spray PLACE 2 SPRAYS INTO BOTH NOSTRILS AT BEDTIME. 48 g 1   glimepiride (AMARYL) 4 MG tablet Take 4 mg by mouth daily with breakfast.     glucose blood (ACCU-CHEK AVIVA PLUS) test strip Test blood sugar once daily. Dx: E11.42 100 each 3   hydrOXYzine (ATARAX/VISTARIL) 25 MG tablet 1 tablet as needed     insulin degludec (TRESIBA FLEXTOUCH) 100 UNIT/ML FlexTouch Pen 20 units     JARDIANCE 25 MG TABS tablet Take 25 mg by mouth daily.     levocetirizine (XYZAL) 5 MG tablet Take 1 tablet (5 mg total) by mouth daily. 90 tablet 1   levocetirizine (XYZAL) 5 MG tablet 1 tablet in the evening     metFORMIN (GLUCOPHAGE) 1000 MG tablet      methocarbamol (ROBAXIN) 750 MG tablet Take 750 mg by mouth as needed for muscle spasms.     Multiple Vitamin (MULTIVITAMIN) capsule Take 1 capsule by mouth daily.     Multiple Vitamins-Minerals (CENTRUM SILVER 50+WOMEN PO) Take 1 tablet by mouth daily.     omeprazole (PRILOSEC) 40 MG capsule Take 40 mg by mouth daily.     omeprazole (PRILOSEC) 40 MG capsule 1 capsule 30 minutes before morning meal     oxybutynin (DITROPAN XL) 15 MG 24 hr tablet TAKE 1 TABLET BY  MOUTH AT  BEDTIME 90 tablet 0   oxybutynin (DITROPAN XL) 15 MG 24 hr tablet 1 tablet     PARoxetine (PAXIL) 10 MG tablet Take 10 mg by mouth daily.     PROLENSA 0.07 % SOLN Instill 1 drop into left eye every evening as directed     rosuvastatin (CRESTOR) 10 MG tablet Take 10 mg by mouth daily.     triamterene-hydrochlorothiazide (MAXZIDE-25) 37.5-25 MG tablet Take 1 tablet by mouth daily.      VENTOLIN HFA 108 (90 Base) MCG/ACT inhaler USE 2 INHALATIONS BY MOUTH  EVERY 6 HOURS AS NEEDED FOR  WHEEZING 54 g 1   Current Facility-Administered Medications on File Prior to Visit  Medication Dose Route Frequency Provider Last Rate Last Admin   Mepolizumab SOLR 100 mg  100 mg Subcutaneous Q28 days Valentina Shaggy, MD   100 mg at 01/19/21 8264     Allergies  Allergen Reactions   Fish Allergy Anaphylaxis   Other Anaphylaxis    Cockroaches, rag weed, pollen,- showed up in allergy test unknown reaction   Banana Other (See Comments)    unknown   Empagliflozin     Other reaction(s): Unknown   Metformin     Other reaction(s): Unknown   Prednisone Other (See Comments)    Anxiety, erratic behavior, ?hallucinations Requiring ED and psychiatric evaluation    Social History   Occupational History   Occupation: disabled    Comment: asthma  Tobacco Use   Smoking status: Never   Smokeless tobacco: Never  Substance and Sexual Activity   Alcohol use: Yes    Alcohol/week: 0.0 standard drinks    Comment: Wine, Socially    Drug use: No   Sexual activity: Never    Family History  Problem Relation Age of Onset   Hypertension Mother    Diabetes Mother    Hyperlipidemia Mother    Stroke Mother    Heart disease Mother    Pulmonary embolism Mother    Hypertension Father    Diabetes Father    Hyperlipidemia Father     Immunization History  Administered Date(s) Administered   Influenza Split 11/28/2014   Influenza Whole 12/05/2005, 01/30/2007, 12/19/2007   PFIZER(Purple Top)SARS-COV-2  Vaccination 02/14/2020   Pneumococcal Polysaccharide-23 08/05/2004   Td 12/05/2005    Objective: There were no vitals filed for this visit.  Ester L Flaming is a pleasant 60 y.o. female obese in NAD. AAO X 3.  Vascular Examination: CFT immediate b/l LE. Palpable DP/PT pulses b/l LE. Digital hair present b/l. Skin temperature gradient WNL b/l. No pain with calf compression b/l. No edema noted b/l. No cyanosis or clubbing noted b/l LE.  Dermatological Examination: Pedal integument with normal turgor, texture and tone b/l LE. No open wounds b/l. No interdigital macerations b/l. Toenails 1-5 b/l elongated, thickened, discolored with subungual debris. +Tenderness with dorsal palpation of nailplates. Hyperkeratotic lesion(s) noted R hallux, submet head 1 right foot, and submet head 5 b/l.  Musculoskeletal Examination: Normal muscle strength 5/5 to all lower extremity muscle groups bilaterally. HAV with bunion deformity noted b/l LE. Hammertoe deformity noted 2-5 b/l.  Footwear Assessment: Does the patient wear appropriate shoes? Yes. Does the patient need inserts/orthotics? Yes.  Neurological Examination: Pt has subjective symptoms of neuropathy. Protective sensation intact 5/5 intact bilaterally with 10g monofilament b/l. Vibratory sensation intact b/l.  Lab: Hemoglobin A1C Latest Ref Rng & Units 03/17/2020  HGBA1C 4.8 - 5.6 % 8.9(H)  Some recent data might be hidden   Assessment: 1. Pain due to onychomycosis of toenails of both feet   2. Callus   3. Hallux valgus, acquired, bilateral   4. Acquired hammertoes of both feet   5. Diabetic nephropathy associated with type 2 diabetes mellitus ([place])   6. Encounter for diabetic foot exam ([place])     ADA Risk Categorization: Low Risk:  Patient has all of the following: Intact protective sensation No prior foot ulcer  No severe deformity Pedal pulses present  Plan: -Examined patient. -Diabetic foot examination performed  today. -Discussed diabetic foot care principles. Literature dispensed on today. -Continue diabetic foot care principles: inspect  feet daily, monitor glucose as recommended by PCP and/or Endocrinologist, and follow prescribed diet per PCP, Endocrinologist and/or dietician. -Toenails 1-5 b/l were debrided in length and girth with sterile nail nippers and dremel without iatrogenic bleeding.  -Callus(es) R hallux, submet head 1 right foot, and submet head 5 b/l pared utilizing sterile scalpel blade without complication or incident. Total number debrided =4. -Patient/POA to call should there be question/concern in the interim.  Return in about 3 months (around 04/20/2021).  Marzetta Board, DPM

## 2021-01-22 NOTE — Telephone Encounter (Signed)
VOB submitted for SynviscOne, bilateral knee BV pending 

## 2021-02-02 ENCOUNTER — Telehealth: Payer: Self-pay | Admitting: Orthopaedic Surgery

## 2021-02-02 NOTE — Telephone Encounter (Signed)
Pt calling wanting to know if there has been any update with her gel inj. Pt has an appt sch'd for 12/5 with Magnus Ivan and wanted to see if it had came through on our end. Pt called her ins and they told her it had been approved but she could not tell me any of the information or who her ins spoke to when they called [place] to speak about approving it. Pt wanted to know if ins would not approve it what the cost for it would be out of pocket. The best call back number is [phone].

## 2021-02-03 NOTE — Telephone Encounter (Signed)
Talked with patient and advised her that all information has been submitted to [facility] for authorization for gel injection.  Patient voiced that she understands.

## 2021-02-04 ENCOUNTER — Telehealth: Payer: Self-pay

## 2021-02-04 NOTE — Telephone Encounter (Signed)
Approved for Synvisc One, bilateral knee. Buy & Bill Patient will be responsible for 20% OOP. Co-pay of $20.00 PA Approval# [id number] Valid 02/03/2021- 05/07/2021  Appt. 02/08/2021

## 2021-02-08 ENCOUNTER — Encounter: Payer: Self-pay | Admitting: Orthopaedic Surgery

## 2021-02-08 ENCOUNTER — Ambulatory Visit: Payer: Medicare HMO | Admitting: Orthopaedic Surgery

## 2021-02-08 DIAGNOSIS — M1712 Unilateral primary osteoarthritis, left knee: Secondary | ICD-10-CM

## 2021-02-08 DIAGNOSIS — M1711 Unilateral primary osteoarthritis, right knee: Secondary | ICD-10-CM | POA: Diagnosis not present

## 2021-02-08 MED ORDER — HYLAN G-F 20 48 MG/6ML IX SOSY
48.0000 mg | PREFILLED_SYRINGE | INTRA_ARTICULAR | Status: AC | PRN
Start: 2021-02-08 — End: 2021-02-08
  Administered 2021-02-08: 48 mg via INTRA_ARTICULAR

## 2021-02-08 NOTE — Progress Notes (Signed)
   Procedure Note  Patient: Melissa Chavez             Date of Birth: 1960-03-29           MRN: [id number]             Visit Date: 02/08/2021  Procedures: Visit Diagnoses:  1. Unilateral primary osteoarthritis, right knee   2. Unilateral primary osteoarthritis, left knee     Large Joint Inj: R knee on 02/08/2021 9:53 AM Indications: pain and diagnostic evaluation Details: 22 G 1.5 in needle, superolateral approach  Arthrogram: No  Medications: 48 mg Hylan 48 MG/6ML Outcome: tolerated well, no immediate complications Procedure, treatment alternatives, risks and benefits explained, specific risks discussed. Consent was given by the patient. Immediately prior to procedure a time out was called to verify the correct patient, procedure, equipment, support staff and site/side marked as required. Patient was prepped and draped in the usual sterile fashion.    Large Joint Inj: L knee on 02/08/2021 9:53 AM Indications: pain and diagnostic evaluation Details: 22 G 1.5 in needle, superolateral approach  Arthrogram: No  Medications: 48 mg Hylan 48 MG/6ML Outcome: tolerated well, no immediate complications Procedure, treatment alternatives, risks and benefits explained, specific risks discussed. Consent was given by the patient. Immediately prior to procedure a time out was called to verify the correct patient, procedure, equipment, support staff and site/side marked as required. Patient was prepped and draped in the usual sterile fashion.   The patient comes in today for hyaluronic acid injections with Synvisc 1 in both knees to treat the pain from osteoarthritis.  She has tried and failed other conservative treatment and has well-documented osteoarthritis of both knees.  She has had steroid injections that did not work well for her.  There is been no acute change in her medical status.  Both knees have significant patellofemoral crepitation.  There is slight varus malalignment and medial  joint line tenderness.  There is good range of motion of both knees.  I did place Synvisc 1 in each knee without difficulty.  All questions and concerns were answered and addressed.  She knows to wait at least 6 months between these injections.  Follow-up is as needed.

## 2021-02-15 ENCOUNTER — Telehealth: Payer: Self-pay | Admitting: Allergy & Immunology

## 2021-02-15 NOTE — Telephone Encounter (Signed)
Patient would like a call back from a nurse. Patient would like to go over the nucala medication. Patient states she has an appointment tomorrow morning and may have to cancel.   Best contact number: [phone]

## 2021-02-15 NOTE — Telephone Encounter (Signed)
Please advise for patient. She says she read on it and found out Nucala causes shingles. Patient says she is having side effects from the injections and wants to speak with Dr. Dellis Anes. Patient is going to stop injections until she speaks with Dr.Gallagher. Patient also states she has yet to see a difference from the injections and she doesn't enjoy the pain of the injections. Patient canceled her injections for tomorrow and scheduled an telephone visit with Dr. Dellis Anes.

## 2021-02-16 ENCOUNTER — Ambulatory Visit: Payer: Medicare HMO

## 2021-02-16 ENCOUNTER — Other Ambulatory Visit: Payer: Self-pay

## 2021-02-16 ENCOUNTER — Ambulatory Visit (INDEPENDENT_AMBULATORY_CARE_PROVIDER_SITE_OTHER): Payer: Medicare HMO | Admitting: Allergy & Immunology

## 2021-02-16 ENCOUNTER — Encounter: Payer: Self-pay | Admitting: Allergy & Immunology

## 2021-02-16 DIAGNOSIS — J4540 Moderate persistent asthma, uncomplicated: Secondary | ICD-10-CM

## 2021-02-16 DIAGNOSIS — J3089 Other allergic rhinitis: Secondary | ICD-10-CM | POA: Diagnosis not present

## 2021-02-16 DIAGNOSIS — J302 Other seasonal allergic rhinitis: Secondary | ICD-10-CM | POA: Diagnosis not present

## 2021-02-16 DIAGNOSIS — T7800XD Anaphylactic reaction due to unspecified food, subsequent encounter: Secondary | ICD-10-CM

## 2021-02-16 MED ORDER — BREZTRI AEROSPHERE 160-9-4.8 MCG/ACT IN AERO
2.0000 | INHALATION_SPRAY | Freq: Two times a day (BID) | RESPIRATORY_TRACT | 1 refills | Status: DC
Start: 2021-02-16 — End: 2021-04-15

## 2021-02-16 MED ORDER — VENTOLIN HFA 108 (90 BASE) MCG/ACT IN AERS: INHALATION_SPRAY | RESPIRATORY_TRACT | 1 refills | Status: DC

## 2021-02-16 NOTE — Telephone Encounter (Signed)
That is fine.   Alynna Hargrove, MD [facility]  

## 2021-02-16 NOTE — Progress Notes (Signed)
RE: Melissa Chavez MRN: [id number] DOB: 23-Feb-1961 Date of Telemedicine Visit: 02/16/2021  Referring provider: Merrilee Seashore, MD Primary care provider: Merrilee Seashore, MD  Chief Complaint: Immunotherapy and Advice Only (Patient gave verbal consent to treat and bill insurance for this televisit.)   Telemedicine Follow Up Visit via Telephone: I connected with [name] for a follow up on 02/16/21 by telephone and verified that I am speaking with the correct person using two identifiers.   I discussed the limitations, risks, security and privacy concerns of performing an evaluation and management service by telephone and the availability of in person appointments. I also discussed with the patient that there may be a patient responsible charge related to this service. The patient expressed understanding and agreed to proceed.  Patient is at home.  Provider is at the office.  Visit start time: 12:00 PM Visit end time: 12:25 PM Insurance consent/check in by: [place] consent and medical assistant/nurse: Caryl Pina  History of Present Illness:  She is a 60 y.o. female, who is being followed for persistent asthma as well as perennial and seasonal allergic rhinitis. Her previous allergy office visit was in March 2022 with myself.  She was last seen in March 2022.  At that time, her lung testing looked a little worse.  We will continue with Dulera 2 puffs twice daily as well as mepolizumab.  For her rhinitis, we continue with Xyzal, Flonase, and Astelin.  We had discussed allergen immunotherapy.  Since the last visit, she has done well.  She has some questions today about Nucala.  Specifically, she is concerned with the warning about shingles and she has not had the vaccine.  Asthma/Respiratory Symptom History: She has been on Nucala since February 2022. She reports that she is still using her rescue inhaler fairly often. This is sometimes 2-3 times per day. She has not needed  steroid courses and she has not been to the ED. She is not a fan of the shots at all. She recently started a diabetes injectable drug.  From ID gather, her only complaint is that she is still needing to use her rescue inhaler.  From what I can gather, her main complaint is that she still needs her rescue inhaler couple times a day.  However, we had a conversation about what needs to be controlled and equated out that she has not required any steroids or ER visits.   Allergic Rhinitis Symptom History: She remains on all of her allergy medicines.  Overall, symptoms are not as bothersome as her cough and wheezing.  She has not been on antibiotics from what I can tell.  Overall, she is doing very well. She is not a fan of injections and does not really want to discuss doing allergy shots.  Food Allergy Symptom History: She continues to avoid fish.  Her epinephrine autoinjector is up-to-date. Her last testing was done in October 2021.  Otherwise, there have been no changes to her past medical history, surgical history, family history, or social history.  Assessment and Plan:  [name] is a 60 y.o. female with:  Moderate persistent asthma, uncomplicated - CHANGING TO FASENRA due to continued use of albuterol and side effect concerns   Seasonal and perennial allergic rhinitis (grasses, ragweed, weeds, trees, indoor molds, outdoor molds, dust mites, cat, dog and cockroach)   Anaphylactic shock due to food (catfish, bass, trout, flounder, and cod)    Eosinophilia - down to 600 as of the last time we checked  Elevated troponin - with normal EKG and echocardiogram   Elevated vitamin B12 - stopped supplementation     1. Moderate persistent asthma, uncomplicated - Testing not performed since this was a televisit. - We we will stop the Nucala and start Fasenra instead. - [name] will reach out to discuss the approval process. - We will get a Spyro at your next visit on December 29, at which time the  [name] might be approved and delivered. - Daily controller medication(s): Dulera 200/69mg two puffs twice daily with spacer - Prior to physical activity: albuterol 2 puffs 10-15 minutes before physical activity. - Rescue medications: albuterol 4 puffs every 4-6 hours as needed - Asthma control goals:  * Full participation in all desired activities (may need albuterol before activity) * Albuterol use two time or less a week on average (not counting use with activity) * Cough interfering with sleep two time or less a month * Oral steroids no more than once a year * No hospitalizations  2. Seasonal and perennial allergic rhinitis (grasses, ragweed, weeds, trees, indoor molds, outdoor molds, dust mites, cat, dog and cockroach) - Continue with: Xyzal (levocetirizine) 546mtablet once daily and Flonase (fluticasone) two sprays per nostril daily and Astelin (azelastine) 2 sprays per nostril 1-2 times daily as needed - Strongly consider nasal saline rinses 1-2 times daily to remove allergens from the nasal cavities as well as help with mucous clearance (this is especially helpful to do before the nasal sprays are given) - Strongly consider starting allergy shots as a means of long-term control. - Allergy shots "re-train" and "reset" the immune system to ignore environmental allergens and decrease the resulting immune response to those allergens (sneezing, itchy watery eyes, runny nose, nasal congestion, etc).    - Allergy shots improve symptoms in 75-85% of patients.   3. 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 - [employer] Lunas up to date.   4. Return in about 16 days (around 03/04/2021).    Diagnostics: None.  Medication List:  Current Outpatient Medications  Medication Sig Dispense Refill   ACCU-CHEK SOFTCLIX LANCETS lancets TEST BLOOD SUGAR ONCE DAILY 100 each 0   albuterol (PROVENTIL) (2.5 MG/3ML) 0.083% nebulizer solution  Take 3 mLs (2.5 mg total) by nebulization every 6 (six) hours as needed. 225 mL 1   azelastine (ASTELIN) 0.1 % nasal spray Place 2 sprays into both nostrils 2 (two) times daily. (Patient taking differently: Place 2 sprays into both nostrils 2 (two) times daily as needed.) 30 mL 5   BEPREVE 1.5 % SOLN SMARTSIG:In Eye(s)     Blood Glucose Calibration (ACCU-CHEK AVIVA) SOLN Test blood sugar once daily. Dx: E11.42 1 each 3   blood glucose meter kit and supplies KIT Use as directed 4 times a day.  DX: R73.09 1 each 0   Blood Glucose Monitoring Suppl (ACCU-CHEK AVIVA PLUS) w/Device KIT TEST BLOOD SUGAR ONCE  DAILY. 1 kit 0   BREZTRI AEROSPHERE 160-9-4.8 MCG/ACT AERO Inhale 2 puffs into the lungs in the morning and at bedtime. 32.1 g 1   colchicine 0.6 MG tablet Take 0.6 mg by mouth daily.     Continuous Blood Gluc Sensor (FREESTYLE LIBRE 2 SENSOR) MISC See admin instructions.     dapagliflozin propanediol (FARXIGA) 5 MG TABS tablet 1 tablet     Difluprednate 0.05 % EMUL Instill 1 drop into left eye four times a day as directed     enalapril (VASOTEC)  10 MG tablet Take 10 mg by mouth daily.      enalapril (VASOTEC) 20 MG tablet Take 20 mg by mouth daily.     EPINEPHrine 0.3 mg/0.3 mL IJ SOAJ injection ADMINISTER 0.3 MG IN THE MUSCLE 1 TIME FOR 1 DOSE     febuxostat (ULORIC) 40 MG tablet Take 40 mg by mouth daily.     glimepiride (AMARYL) 4 MG tablet Take 4 mg by mouth daily with breakfast.     glucose blood (ACCU-CHEK AVIVA PLUS) test strip Test blood sugar once daily. Dx: E11.42 100 each 3   hydrOXYzine (ATARAX/VISTARIL) 25 MG tablet 1 tablet as needed     insulin degludec (TRESIBA FLEXTOUCH) 100 UNIT/ML FlexTouch Pen 20 units     Insulin Pen Needle (B-D UF III MINI PEN NEEDLES) 31G X 5 MM MISC See admin instructions.     levocetirizine (XYZAL) 5 MG tablet Take 1 tablet (5 mg total) by mouth daily. 90 tablet 1   methocarbamol (ROBAXIN) 750 MG tablet Take 750 mg by mouth as needed for muscle spasms.      montelukast (SINGULAIR) 10 MG tablet 1 tablet     Multiple Vitamins-Minerals (CENTRUM SILVER 50+WOMEN PO) Take 1 tablet by mouth daily.     NUCALA 100 MG/ML SOSY Inject into the skin.     omeprazole (PRILOSEC) 40 MG capsule 1 capsule 30 minutes before morning meal     oxybutynin (DITROPAN XL) 15 MG 24 hr tablet TAKE 1 TABLET BY MOUTH AT  BEDTIME 90 tablet 0   PARoxetine (PAXIL) 10 MG tablet Take 10 mg by mouth daily.     PROLENSA 0.07 % SOLN Instill 1 drop into left eye every evening as directed     rosuvastatin (CRESTOR) 10 MG tablet Take 10 mg by mouth daily.     Semaglutide,0.25 or 0.5MG/DOS, (OZEMPIC, 0.25 OR 0.5 MG/DOSE,) 2 MG/1.5ML SOPN See admin instructions.     triamterene-hydrochlorothiazide (MAXZIDE-25) 37.5-25 MG tablet Take 1 tablet by mouth daily.      VENTOLIN HFA 108 (90 Base) MCG/ACT inhaler USE 2 INHALATIONS BY MOUTH  EVERY 6 HOURS AS NEEDED FOR WHEEZING 54 g 1   Cyanocobalamin (B-12) 1000 MCG CAPS Take 2 capsules by mouth daily. (Patient not taking: Reported on 02/16/2021)     fluconazole (DIFLUCAN) 100 MG tablet 1 tablet (Patient not taking: Reported on 02/16/2021)     Current Facility-Administered Medications  Medication Dose Route Frequency Provider Last Rate Last Admin   Mepolizumab SOLR 100 mg  100 mg Subcutaneous Q28 days Valentina Shaggy, MD   100 mg at 01/19/21 5400   Allergies: Allergies  Allergen Reactions   Fish Allergy Anaphylaxis   Other Anaphylaxis and Rash    Cockroaches, rag weed, pollen,- showed up in allergy test unknown reaction Cockroaches, rag weed, pollen,- showed up in allergy test unknown reaction Cockroaches, rag weed, pollen,- showed up in allergy test unknown reaction   Banana Other (See Comments)    unknown   Empagliflozin Other (See Comments)    Other reaction(s): Unknown   Metformin Other (See Comments)    Other reaction(s): Unknown   Prednisone Other (See Comments)    Anxiety, erratic behavior, ?hallucinations Requiring ED  and psychiatric evaluation   I reviewed her past medical history, social history, family history, and environmental history and no significant changes have been reported from previous visits.  Review of Systems  Constitutional:  Negative for activity change and appetite change.  HENT:  Negative for congestion, postnasal drip,  rhinorrhea, sinus pressure and sore throat.   Eyes:  Negative for pain, discharge, redness and itching.  Respiratory:  Positive for cough and wheezing. Negative for choking, shortness of breath and stridor.   Gastrointestinal:  Negative for diarrhea, nausea and vomiting.  Endocrine: Negative for cold intolerance and heat intolerance.  Musculoskeletal:  Negative for arthralgias, joint swelling and myalgias.  Skin:  Negative for rash.  Allergic/Immunologic: Negative for environmental allergies and food allergies.   Objective:  Physical exam not obtained as encounter was done via telephone.   Previous notes and tests were reviewed.  I discussed the assessment and treatment plan with the patient. The patient was provided an opportunity to ask questions and all were answered. The patient agreed with the plan and demonstrated an understanding of the instructions.   The patient was advised to call back or seek an in-person evaluation if the symptoms worsen or if the condition fails to improve as anticipated.  I provided 25 minutes of non-face-to-face time during this encounter.  It was my pleasure to participate in [place] care today. Please feel free to contact me with any questions or concerns.   Sincerely,  Valentina Shaggy, MD

## 2021-02-16 NOTE — Patient Instructions (Addendum)
1. Moderate persistent asthma, uncomplicated - Testing not performed since this was a televisit. - We we will stop the Nucala and start Fasenra instead. - [name] will reach out to discuss the approval process. - We will get a Spyro at your next visit on December 29, at which time the [name] might be approved and delivered. - Daily controller medication(s): Dulera 200/62mcg two puffs twice daily with spacer - Prior to physical activity: albuterol 2 puffs 10-15 minutes before physical activity. - Rescue medications: albuterol 4 puffs every 4-6 hours as needed - Asthma control goals:  * Full participation in all desired activities (may need albuterol before activity) * Albuterol use two time or less a week on average (not counting use with activity) * Cough interfering with sleep two time or less a month * Oral steroids no more than once a year * No hospitalizations  2. Seasonal and perennial allergic rhinitis (grasses, ragweed, weeds, trees, indoor molds, outdoor molds, dust mites, cat, dog and cockroach) - Continue with: Xyzal (levocetirizine) 5mg  tablet once daily and Flonase (fluticasone) two sprays per nostril daily and Astelin (azelastine) 2 sprays per nostril 1-2 times daily as needed - Strongly consider nasal saline rinses 1-2 times daily to remove allergens from the nasal cavities as well as help with mucous clearance (this is especially helpful to do before the nasal sprays are given) - Strongly consider starting allergy shots as a means of long-term control. - Allergy shots "re-train" and "reset" the immune system to ignore environmental allergens and decrease the resulting immune response to those allergens (sneezing, itchy watery eyes, runny nose, nasal congestion, etc).    - Allergy shots improve symptoms in 75-85% of patients.   3. 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 - 05-25-1995 is up to date.   4. Return in  about 16 days (around 03/04/2021).    Please inform 03/06/2021 of any Emergency Department visits, hospitalizations, or changes in symptoms. Call us before going to the ED for breathing or allergy symptoms since we might be able to fit you in for a sick visit. Feel free to contact us anytime with any questions, problems, or concerns.  It was a pleasure to see you again today!  Websites that have reliable patient information: 1. American Academy of Asthma, Allergy, and Immunology: www.aaaai.org 2. Food Allergy Research and Education (FARE): foodallergy.org 3. Mothers of Asthmatics: http://www.asthmacommunitynetwork.org 4. American College of Allergy, Asthma, and Immunology: www.acaai.org   COVID-19 Vaccine Information can be found at: [place] For questions related to vaccine distribution or appointments, please email [email or web address] or call [phone].   We realize that you might be concerned about having an allergic reaction to the COVID19 vaccines. To help with that concern, WE ARE OFFERING THE COVID19 VACCINES IN OUR OFFICE! Ask the front desk for dates!     Like [phone] on [place] and Instagram for our latest updates!      A healthy democracy works best when [employer] participate! Make sure you are registered to vote! If you have moved or changed any of your contact information, you will need to get this updated before voting!  In some cases, you MAY be able to register to vote online: [employer]

## 2021-02-17 ENCOUNTER — Telehealth: Payer: Self-pay | Admitting: Orthopaedic Surgery

## 2021-02-17 NOTE — Telephone Encounter (Signed)
Patient called asked can she use cold compresses on both of her knees? Patient said she is taking Tylenol which is not working. Patient said she want to know what to do for her knees until the gel injections start working.  The number to contact patient is [phone]

## 2021-02-17 NOTE — Telephone Encounter (Signed)
Please advise 

## 2021-02-17 NOTE — Telephone Encounter (Signed)
I called pt and advised. She stated understanding. She stated her Pcp doesn't want her taking aleve. I advised her to ask her pcp if she can use voltaren gel instead. She stated she would ask and try that.

## 2021-02-24 ENCOUNTER — Telehealth: Payer: Self-pay | Admitting: *Deleted

## 2021-02-24 NOTE — Telephone Encounter (Signed)
Called patient and advised change and approval for Fasenra and Rx to [facility] to change therapy and will reach out once delivery set up to make appt to start therapy

## 2021-02-24 NOTE — Telephone Encounter (Signed)
-----   Message from Alfonse Spruce, MD sent at 02/16/2021 12:34 PM EST ----- Changing to [name] due to lack of efficacy and concern about shingles

## 2021-02-25 NOTE — Telephone Encounter (Signed)
Thanks for taking care of that!   Donice Alperin, MD [facility]  

## 2021-02-26 DIAGNOSIS — E785 Hyperlipidemia, unspecified: Secondary | ICD-10-CM | POA: Diagnosis not present

## 2021-02-26 DIAGNOSIS — E1169 Type 2 diabetes mellitus with other specified complication: Secondary | ICD-10-CM | POA: Diagnosis not present

## 2021-02-26 DIAGNOSIS — N189 Chronic kidney disease, unspecified: Secondary | ICD-10-CM | POA: Diagnosis not present

## 2021-02-26 DIAGNOSIS — Z6838 Body mass index (BMI) 38.0-38.9, adult: Secondary | ICD-10-CM | POA: Diagnosis not present

## 2021-02-26 DIAGNOSIS — I10 Essential (primary) hypertension: Secondary | ICD-10-CM | POA: Diagnosis not present

## 2021-03-04 ENCOUNTER — Encounter: Payer: Self-pay | Admitting: Allergy & Immunology

## 2021-03-04 ENCOUNTER — Ambulatory Visit (INDEPENDENT_AMBULATORY_CARE_PROVIDER_SITE_OTHER): Payer: Medicare HMO | Admitting: Allergy & Immunology

## 2021-03-04 ENCOUNTER — Other Ambulatory Visit: Payer: Self-pay

## 2021-03-04 VITALS — BP 114/62 | HR 92 | Temp 98.4°F | Resp 24 | Ht 62.0 in | Wt 206.2 lb

## 2021-03-04 DIAGNOSIS — J302 Other seasonal allergic rhinitis: Secondary | ICD-10-CM

## 2021-03-04 DIAGNOSIS — T7800XD Anaphylactic reaction due to unspecified food, subsequent encounter: Secondary | ICD-10-CM

## 2021-03-04 DIAGNOSIS — J4540 Moderate persistent asthma, uncomplicated: Secondary | ICD-10-CM | POA: Diagnosis not present

## 2021-03-04 DIAGNOSIS — J3089 Other allergic rhinitis: Secondary | ICD-10-CM | POA: Diagnosis not present

## 2021-03-04 DIAGNOSIS — J984 Other disorders of lung: Secondary | ICD-10-CM

## 2021-03-04 NOTE — Patient Instructions (Addendum)
1. Moderate persistent asthma, uncomplicated - Lung testing looked fairly stable. - We are going to order a chest CT to get a look at your lung tissue to see what is going on inside. - We will work on getting this approved.  - We hope to get [name] on board soon.  - Spacer and demonstration provided.  - Daily controller medication(s): Breztri two puffs two puffs twice daily with spacer - Prior to physical activity: albuterol 2 puffs 10-15 minutes before physical activity. - Rescue medications: albuterol 4 puffs every 4-6 hours as needed - Asthma control goals:  * Full participation in all desired activities (may need albuterol before activity) * Albuterol use two time or less a week on average (not counting use with activity) * Cough interfering with sleep two time or less a month * Oral steroids no more than once a year * No hospitalizations  2. Seasonal and perennial allergic rhinitis (grasses, ragweed, weeds, trees, indoor molds, outdoor molds, dust mites, cat, dog and cockroach) - Continue with: Xyzal (levocetirizine) 5mg  tablet once daily and Flonase (fluticasone) two sprays per nostril daily and Astelin (azelastine) 2 sprays per nostril 1-2 times daily as needed - Strongly consider nasal saline rinses 1-2 times daily to remove allergens from the nasal cavities as well as help with mucous clearance (this is especially helpful to do before the nasal sprays are given) - Strongly consider starting allergy shots as a means of long-term control.  3. 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 - is up to date.   4. Return in about 6 weeks (around 04/15/2021).    Please inform 06/13/2021 of any Emergency Department visits, hospitalizations, or changes in symptoms. Call Melissa Chavez before going to the ED for breathing or allergy symptoms since we might be able to fit you in for a sick visit. Feel free to contact Melissa Chavez anytime with any  questions, problems, or concerns.  It was a pleasure to see you again today!  Websites that have reliable patient information: 1. American Academy of Asthma, Allergy, and Immunology: www.aaaai.org 2. Food Allergy Research and Education (FARE): foodallergy.org 3. Mothers of Asthmatics: http://www.asthmacommunitynetwork.org 4. American College of Allergy, Asthma, and Immunology: www.acaai.org   COVID-19 Vaccine Information can be found at: Melissa Chavez For questions related to vaccine distribution or appointments, please email [email or web address] or call [phone].   We realize that you might be concerned about having an allergic reaction to the COVID19 vaccines. To help with that concern, WE ARE OFFERING THE COVID19 VACCINES IN OUR OFFICE! Ask the front desk for dates!     Like [phone] on Melissa Chavez and Instagram for our latest updates!      A healthy democracy works best when [employer] participate! Make sure you are registered to vote! If you have moved or changed any of your contact information, you will need to get this updated before voting!  In some cases, you MAY be able to register to vote online: [employer]

## 2021-03-04 NOTE — Progress Notes (Signed)
FOLLOW UP  Date of Service/Encounter:  03/04/21   Assessment:   Moderate persistent asthma, uncomplicated - CHANGING TO FASENRA due to continued use of albuterol and side effect concerns   Seasonal and perennial allergic rhinitis (grasses, ragweed, weeds, trees, indoor molds, outdoor molds, dust mites, cat, dog and cockroach)   Anaphylactic shock due to food (catfish, bass, trout, flounder, and cod)    Eosinophilia - down to 600 as of the last time we checked   Elevated troponin - with normal EKG and echocardiogram   Elevated vitamin B12 - stopped supplementation  Plan/Recommendations:   1. Moderate persistent asthma - with persistent restrictive pattern on spirometry (getting labs for ABPA and alpha-1 antitrypsin today) - Lung testing looked fairly stable. - We are going to order a chest CT to get a look at your lung tissue to see what is going on inside. - We will work on getting this approved.  - We hope to get Melissa Chavez on board soon.  - Spacer and demonstration provided.  - Daily controller medication(s): Breztri two puffs two puffs twice daily with spacer - Prior to physical activity: albuterol 2 puffs 10-15 minutes before physical activity. - Rescue medications: albuterol 4 puffs every 4-6 hours as needed - Asthma control goals:  * Full participation in all desired activities (may need albuterol before activity) * Albuterol use two time or less a week on average (not counting use with activity) * Cough interfering with sleep two time or less a month * Oral steroids no more than once a year * No hospitalizations  2. Seasonal and perennial allergic rhinitis (grasses, ragweed, weeds, trees, indoor molds, outdoor molds, dust mites, cat, dog and cockroach) - Continue with: Xyzal (levocetirizine) 5mg  tablet once daily and Flonase (fluticasone) two sprays per nostril daily and Astelin (azelastine) 2 sprays per nostril 1-2 times daily as needed - Strongly consider nasal saline  rinses 1-2 times daily to remove allergens from the nasal cavities as well as help with mucous clearance (this is especially helpful to do before the nasal sprays are given) - Strongly consider starting allergy shots as a means of long-term control.  3. 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 - is up to date.   4. Return in about 6 weeks (around 04/15/2021).   Subjective:   Melissa Chavez is a 60 y.o. female presenting today for follow up of  Chief Complaint  Patient presents with   Asthma    Says it is not getting any better. Says she is using her albuterol more.   Allergic Rhinitis     Says her allergies are bothering her. Says she has dry eye.     Melissa Chavez has a history of the following: Patient Active Problem List   Diagnosis Date Noted   Chronic kidney disease 01/18/2021   Chronic kidney disease, stage 3a (HCC) 01/18/2021   DDD (degenerative disc disease), cervical 01/18/2021   Diabetic renal disease (HCC) 01/18/2021   Idiopathic gout 01/18/2021   Long term (current) use of insulin (HCC) 01/18/2021   Hyperlipidemia, unspecified 01/18/2021   Morbid obesity (HCC) 01/18/2021   Unilateral primary osteoarthritis, left knee 09/13/2016   Unilateral primary osteoarthritis, right knee 09/13/2016   Pressure injury of skin 03/08/2016   Acute gout of left knee 03/07/2016   Sinus congestion 11/29/2015   Bilateral leg edema 09/25/2015   Abdominal pain, left upper quadrant 09/25/2015   DJD (degenerative  joint disease) of upper arm 09/07/2015   Prednisone adverse reaction 08/20/2015   Abdominal pain, chronic, epigastric 06/30/2015   Osteoarthritis of right foot 03/22/2015   Type 2 diabetes mellitus with diabetic polyneuropathy, without long-term current use of insulin ([place]) 03/20/2015   BMI 40.0-44.9, adult ([place]) 06/13/2013   Overactive bladder 06/13/2013   Allergic rhinitis 06/13/2013   Asthma,  moderate persistent 06/13/2013   BIPOLAR DISORDER UNSPECIFIED 03/18/2008   PERIPHERAL EDEMA 07/03/2007   GERD 01/30/2007   HYPERCHOLESTEROLEMIA 12/07/2006   Essential hypertension 12/07/2006   Osteoarthritis of right knee 12/07/2006   ANEMIA, IRON DEFICIENCY 01/06/2006    History obtained from: chart review and patient.  Melissa Chavez is a 60 y.o. female presenting for a follow up visit.  She was last seen in December 2022 via televisit.  This is when we discussed starting Fasenra.  We will continue Dulera 200 mcg 2 puffs twice daily with a spacer as well as albuterol as needed.  For her rhinitis, we will continue with levocetirizine as well as fluticasone and azelastine.  Since last visit, she has done well. She had her birthday celebration in [place] where he daughter lives.   Asthma/Respiratory Symptom History: Asthma is acting up. She  has not started her [name] yet. She was changed to [place] due to insurance coverage. She is unsure of how well it is doing. She does NOT use a spacer. She does not feel that her asthma is under good control.  She does not remember ever having had a chest CT.  She did have an abdominal CT last year the year before, but it did not extend into her chest.  She has never seen a pulmonologist.  We do not have a full pulmonary function test in our system, but she tells me that she got them routinely which she would participating in asthma medication studies.  We never did get those results from the study site.  Allergic Rhinitis Symptom History: She remains on levocetirizine as well as Flonase and Astelin.  She has had some issues with some dry mouth.  She has not tried getting off of her Xyzal to see if that helps.  She did not required any antibiotics.  She has not been on prednisone.  Otherwise, there have been no changes to her past medical history, surgical history, family history, or social history.    Review of Systems  Constitutional: Negative.  Negative  for chills, fever, malaise/fatigue and weight loss.  HENT:  Positive for congestion and sinus pain. Negative for ear discharge and ear pain.   Eyes:  Negative for pain, discharge and redness.  Respiratory:  Positive for cough and shortness of breath. Negative for sputum production and wheezing.   Cardiovascular: Negative.  Negative for chest pain and palpitations.  Gastrointestinal:  Negative for abdominal pain, constipation, diarrhea, heartburn, nausea and vomiting.  Skin: Negative.  Negative for itching and rash.  Neurological:  Negative for dizziness and headaches.  Endo/Heme/Allergies:  Positive for environmental allergies. Does not bruise/bleed easily.      Objective:   Blood pressure 114/62, pulse 92, temperature 98.4 F (36.9 C), temperature source Temporal, resp. rate (!) 24, height 5\' 2"  (1.575 m), weight 206 lb 3.2 oz (93.5 kg), last menstrual period 05/30/2013, SpO2 98 %. Body mass index is 37.71 kg/m.    Physical Exam Vitals reviewed.  Constitutional:      Appearance: She is well-developed.     Comments: Very talkative.  HENT:     Head: Normocephalic  and atraumatic.     Right Ear: Tympanic membrane, ear canal and external ear normal.     Left Ear: Tympanic membrane, ear canal and external ear normal.     Nose: No nasal deformity, septal deviation, mucosal edema or rhinorrhea.     Right Turbinates: Enlarged, swollen and pale.     Left Turbinates: Enlarged, swollen and pale.     Right Sinus: No maxillary sinus tenderness or frontal sinus tenderness.     Left Sinus: No maxillary sinus tenderness or frontal sinus tenderness.     Mouth/Throat:     Mouth: Mucous membranes are not pale and not dry.     Pharynx: Uvula midline.     Comments: Cobblestoning present.  Tonsils normal. Eyes:     General: Lids are normal. No allergic shiner.       Right eye: No discharge.        Left eye: No discharge.     Conjunctiva/sclera: Conjunctivae normal.     Right eye: Right  conjunctiva is not injected. No chemosis.    Left eye: Left conjunctiva is not injected. No chemosis.    Pupils: Pupils are equal, round, and reactive to light.  Cardiovascular:     Rate and Rhythm: Normal rate and regular rhythm.     Heart sounds: Normal heart sounds.  Pulmonary:     Effort: Pulmonary effort is normal. No tachypnea, accessory muscle usage or respiratory distress.     Breath sounds: Normal breath sounds. No wheezing, rhonchi or rales.     Comments: Decreased air movement at the bases. Chest:     Chest wall: No tenderness.  Lymphadenopathy:     Cervical: No cervical adenopathy.  Skin:    Coloration: Skin is not pale.     Findings: No abrasion, erythema, petechiae or rash. Rash is not papular, urticarial or vesicular.  Neurological:     Mental Status: She is alert.  Psychiatric:        Behavior: Behavior is cooperative.     Diagnostic studies:    Spirometry: results abnormal (FEV1: 0.82/41%, FVC: 1.29/52%, FEV1/FVC: 64%).    Spirometry consistent with mixed obstructive and restrictive disease.  Overall, values are fairly stable.  They have never been normal for me.    Allergy Studies: none        Salvatore Marvel, MD  [facility]

## 2021-03-09 LAB — ASPERGILLUS PRECIPITINS
A.Fumigatus #1 Abs: NEGATIVE
Aspergillus Flavus Antibodies: NEGATIVE
Aspergillus Niger Antibodies: NEGATIVE
Aspergillus glaucus IgG: NEGATIVE
Aspergillus nidulans IgG: NEGATIVE
Aspergillus terreus IgG: NEGATIVE

## 2021-03-09 LAB — ALPHA-1-ANTITRYPSIN: A-1 Antitrypsin: 143 mg/dL (ref 101–187)

## 2021-03-11 ENCOUNTER — Other Ambulatory Visit: Payer: Self-pay | Admitting: *Deleted

## 2021-03-11 ENCOUNTER — Telehealth: Payer: Self-pay | Admitting: *Deleted

## 2021-03-11 MED ORDER — FASENRA PEN 30 MG/ML ~~LOC~~ SOAJ: 30.0000 mg | SUBCUTANEOUS | 8 refills | Status: DC

## 2021-03-11 NOTE — Telephone Encounter (Signed)
Called patients insurance to initiate a PA for the Chest CT and they stated that she no longer has active insurance with [employer]. I called and spoke with the patient and she stated that she now has [facility] I advised if she could send a picture of her new insurance card I can get that updated for her and work on the [place]. Patient verbalized understanding and will send updated pictures through MyChart.

## 2021-03-18 ENCOUNTER — Other Ambulatory Visit: Payer: Self-pay

## 2021-03-18 ENCOUNTER — Ambulatory Visit: Payer: Medicare HMO

## 2021-03-18 DIAGNOSIS — E1121 Type 2 diabetes mellitus with diabetic nephropathy: Secondary | ICD-10-CM

## 2021-03-18 NOTE — Progress Notes (Signed)
Discussed diabetic shoes and insoles with patient. Patient reports she is not financially ready to cover the 20% coinsurance at this time and would like to discuss shoes and inserts in the future, perhaps the summer.  All questions answered and concerns addressed. Plan of care to resume at patient's discretion.

## 2021-04-01 ENCOUNTER — Telehealth: Payer: Self-pay | Admitting: *Deleted

## 2021-04-01 NOTE — Telephone Encounter (Signed)
L/M for patient that [name] in clinic and she can call and schedule appt to start therapy

## 2021-04-14 DIAGNOSIS — E782 Mixed hyperlipidemia: Secondary | ICD-10-CM | POA: Diagnosis not present

## 2021-04-14 DIAGNOSIS — E1169 Type 2 diabetes mellitus with other specified complication: Secondary | ICD-10-CM | POA: Diagnosis not present

## 2021-04-15 ENCOUNTER — Other Ambulatory Visit: Payer: Self-pay

## 2021-04-15 ENCOUNTER — Ambulatory Visit (INDEPENDENT_AMBULATORY_CARE_PROVIDER_SITE_OTHER): Payer: Medicare Other

## 2021-04-15 ENCOUNTER — Encounter: Payer: Self-pay | Admitting: Allergy & Immunology

## 2021-04-15 ENCOUNTER — Ambulatory Visit: Payer: Medicare Other | Admitting: Allergy & Immunology

## 2021-04-15 VITALS — BP 122/64 | HR 80 | Temp 98.0°F | Resp 20 | Ht 59.84 in | Wt 205.6 lb

## 2021-04-15 DIAGNOSIS — J4540 Moderate persistent asthma, uncomplicated: Secondary | ICD-10-CM

## 2021-04-15 DIAGNOSIS — J3089 Other allergic rhinitis: Secondary | ICD-10-CM | POA: Diagnosis not present

## 2021-04-15 DIAGNOSIS — J302 Other seasonal allergic rhinitis: Secondary | ICD-10-CM

## 2021-04-15 DIAGNOSIS — J4550 Severe persistent asthma, uncomplicated: Secondary | ICD-10-CM | POA: Diagnosis not present

## 2021-04-15 DIAGNOSIS — T7800XD Anaphylactic reaction due to unspecified food, subsequent encounter: Secondary | ICD-10-CM

## 2021-04-15 MED ORDER — LEVOCETIRIZINE DIHYDROCHLORIDE 5 MG PO TABS
5.0000 mg | ORAL_TABLET | Freq: Every day | ORAL | 1 refills | Status: DC
Start: 2021-04-15 — End: 2022-01-26

## 2021-04-15 MED ORDER — CIPROFLOXACIN-DEXAMETHASONE 0.3-0.1 % OT SUSP: 4.0000 [drp] | Freq: Two times a day (BID) | OTIC | 3 refills | Status: AC

## 2021-04-15 MED ORDER — BENRALIZUMAB 30 MG/ML ~~LOC~~ SOSY
30.0000 mg | PREFILLED_SYRINGE | SUBCUTANEOUS | Status: AC
  Administered 2021-04-15 – 2022-03-10 (×8): 30 mg via SUBCUTANEOUS

## 2021-04-15 MED ORDER — BREZTRI AEROSPHERE 160-9-4.8 MCG/ACT IN AERO: 2.0000 | INHALATION_SPRAY | Freq: Two times a day (BID) | RESPIRATORY_TRACT | 3 refills | Status: DC

## 2021-04-15 NOTE — Progress Notes (Signed)
Patient came in today and was seen in the office today by Dr. Ernst Bowler in room 5. Dr. Ernst Bowler advised patient to start her [name] today. Patient received her injection on her left arm and was advised by Dr. Ernst Bowler that she could leave after her injection. Patient was notified to call our office with any issues or concerns she may have after receiving her injection. Patient verbalized understanding.

## 2021-04-15 NOTE — Progress Notes (Signed)
FOLLOW UP  Date of Service/Encounter:  04/15/21   Assessment:   Moderate persistent asthma, uncomplicated - started Fasenra today   Seasonal and perennial allergic rhinitis (grasses, ragweed, weeds, trees, indoor molds, outdoor molds, dust mites, cat, dog and cockroach)   Anaphylactic shock due to food (catfish, bass, trout, flounder, and cod)    Eosinophilia - down to 600 as of the last time we checked   Elevated troponin - with normal EKG and echocardiogram   Elevated vitamin B12 - stopped supplementation  Ear itching  Plan/Recommendations:   1. Moderate persistent asthma, uncomplicated - We did not do lung testing, but we will check next time once you have been on Fasenra for a period of time. [name] given today in clinic  - Daily controller medication(s): Breztri two puffs two puffs twice daily with spacer + Fasenra every month x 3 doses (then every 8 weeks thereafter) - Prior to physical activity: albuterol 2 puffs 10-15 minutes before physical activity. - Rescue medications: albuterol 4 puffs every 4-6 hours as needed - Asthma control goals:  * Full participation in all desired activities (may need albuterol before activity) * Albuterol use two time or less a week on average (not counting use with activity) * Cough interfering with sleep two time or less a month * Oral steroids no more than once a year * No hospitalizations  2. Seasonal and perennial allergic rhinitis (grasses, ragweed, weeds, trees, indoor molds, outdoor molds, dust mites, cat, dog and cockroach) - Continue with: Xyzal (levocetirizine) 5mg  tablet once daily and Astelin (azelastine) 2 sprays per nostril 1-2 times daily as needed - Strongly consider nasal saline rinses 1-2 times daily to remove allergens from the nasal cavities as well as help with mucous clearance (this is especially helpful to do before the nasal sprays are given) - Strongly consider starting allergy shots as a means of long-term  control. - CPT codes provided.  3. 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 - Wynona Luna is up to date.   4. Ear itching - Start cipro-dex ear drops twice daily and continue for one week.  - The drops contain a steroid and an antibiotic, but I am mostly using it for the steroid. - This should help with inflammation and the itching.   5. Return in about 3 months (around 07/13/2021).    Subjective:   YAMILEZ STANTON is a 61 y.o. female presenting today for follow up of  Chief Complaint  Patient presents with   Asthma    Doing pretty good.   Allergic Rhinitis     Crazy right now because she hasn't been taking her allergy medication as she should.   Other    Fasenra approval    SHAMONE SAMMIS has a history of the following: Patient Active Problem List   Diagnosis Date Noted   Chronic kidney disease 01/18/2021   Chronic kidney disease, stage 3a ([place]) 01/18/2021   DDD (degenerative disc disease), cervical 01/18/2021   Diabetic renal disease ([place]) 01/18/2021   Idiopathic gout 01/18/2021   Long term (current) use of insulin ([place]) 01/18/2021   Hyperlipidemia, unspecified 01/18/2021   Morbid obesity ([place]) 01/18/2021   Unilateral primary osteoarthritis, left knee 09/13/2016   Unilateral primary osteoarthritis, right knee 09/13/2016   Pressure injury of skin 03/08/2016   Acute gout of left knee 03/07/2016   Sinus congestion 11/29/2015   Bilateral leg edema 09/25/2015   Abdominal pain,  left upper quadrant 09/25/2015   DJD (degenerative joint disease) of upper arm 09/07/2015   Prednisone adverse reaction 08/20/2015   Abdominal pain, chronic, epigastric 06/30/2015   Osteoarthritis of right foot 03/22/2015   Type 2 diabetes mellitus with diabetic polyneuropathy, without long-term current use of insulin ([place]) 03/20/2015   BMI 40.0-44.9, adult ([place]) 06/13/2013   Overactive bladder 06/13/2013   Allergic rhinitis  06/13/2013   Asthma, moderate persistent 06/13/2013   BIPOLAR DISORDER UNSPECIFIED 03/18/2008   PERIPHERAL EDEMA 07/03/2007   GERD 01/30/2007   HYPERCHOLESTEROLEMIA 12/07/2006   Essential hypertension 12/07/2006   Osteoarthritis of right knee 12/07/2006   ANEMIA, IRON DEFICIENCY 01/06/2006    History obtained from: chart review and patient.  Unique is a 61 y.o. female presenting for a follow up visit.  She was last seen in December 2022.  At that time, her lung testing looked stable.  We did order a chest CT due to her continued restriction on her spirometry.  We also obtained labs to look for ABPA and alpha-1 antitrypsin deficiency.  These were normal.  We changed from [place] to [place].  We continued with Breztri 2 puffs twice daily as well as albuterol as needed.  For her allergic rhinitis, we continued with Xyzal as well as Flonase and Astelin.  Allergy shots were discussed for long-term control.  Continued avoidance of fish was recommended.  Her epinephrine autoinjector was up-to-date.  Since the last visit, she has mostly done well.  She has had a hard time because she recently moved her mother into a long-term care facility.  She has a history of memory loss and needed more care than [facility] could provide.  Every time that she visits her mother, she always ask when she can go home.  This seems to break her heart. Carlen's daughter still lives with her at her house, so she has some company at home.  However, she seems to keep herself.  She has been going through some   Asthma/Respiratory Symptom History: She remains on the Breztri 2 puffs twice daily.  She is interested in starting the [place] today, which was approved.  She has previously been on Nucala for several months without much improvement in her symptoms.  She does have some shortness of breath and is unable to walk as far as she used to.  Her knee pain is also contributing to this.  She has not needed prednisone and has not been to  the emergency room since we saw her.  Allergic Rhinitis Symptom History: Her allergic rhinitis is under fairly good control with Xyzal.  The only no spray that she uses azelastine.  She does not use the Flonase at all.  She is interested in allergy shots as she knows it will help her environmental allergies and her asthma.  She would like the CPT codes so she can talk to her insurance company.  Food Allergy Symptom History: She continues to avoid seafood.  She has had no accidental ingestions.  She does have an up-to-date EpiPen.    She does report that she is having bilateral ear itching.  She is wondering if there is anything we can provide to help with that.  There is no bleeding or discharge from the ears.  She has had no fevers.  Otherwise, there have been no changes to her past medical history, surgical history, family history, or social history.    Review of Systems  Constitutional: Negative.  Negative for chills, fever, malaise/fatigue and weight loss.  HENT:  Positive for congestion. Negative for ear discharge, ear pain and sinus pain.   Eyes:  Negative for pain, discharge and redness.  Respiratory:  Positive for cough and shortness of breath. Negative for sputum production and wheezing.   Cardiovascular: Negative.  Negative for chest pain and palpitations.  Gastrointestinal:  Negative for abdominal pain, constipation, diarrhea, heartburn, nausea and vomiting.  Skin: Negative.  Negative for itching and rash.  Neurological:  Negative for dizziness and headaches.  Endo/Heme/Allergies:  Positive for environmental allergies. Does not bruise/bleed easily.      Objective:   Blood pressure 122/64, pulse 80, temperature 98 F (36.7 C), temperature source Temporal, resp. rate 20, height 4' 11.84" (1.52 m), weight 205 lb 9.6 oz (93.3 kg), last menstrual period 05/30/2013, SpO2 98 %. Body mass index is 40.36 kg/m.   Physical Exam:  Physical Exam Vitals reviewed.  Constitutional:       Appearance: She is well-developed.     Comments: Very talkative.  HENT:     Head: Normocephalic and atraumatic.     Right Ear: Tympanic membrane, ear canal and external ear normal.     Left Ear: Tympanic membrane, ear canal and external ear normal.     Nose: No nasal deformity, septal deviation, mucosal edema or rhinorrhea.     Right Turbinates: Enlarged, swollen and pale.     Left Turbinates: Enlarged, swollen and pale.     Right Sinus: No maxillary sinus tenderness or frontal sinus tenderness.     Left Sinus: No maxillary sinus tenderness or frontal sinus tenderness.     Comments: No nasal polyps.    Mouth/Throat:     Mouth: Mucous membranes are not pale and not dry.     Pharynx: Uvula midline.     Comments: Cobblestoning present.  Tonsils normal. Eyes:     General: Lids are normal. Allergic shiner present.        Right eye: No discharge.        Left eye: No discharge.     Conjunctiva/sclera: Conjunctivae normal.     Right eye: Right conjunctiva is not injected. No chemosis.    Left eye: Left conjunctiva is not injected. No chemosis.    Pupils: Pupils are equal, round, and reactive to light.  Cardiovascular:     Rate and Rhythm: Normal rate and regular rhythm.     Heart sounds: Normal heart sounds.  Pulmonary:     Effort: Pulmonary effort is normal. No tachypnea, accessory muscle usage or respiratory distress.     Breath sounds: Normal breath sounds. No wheezing, rhonchi or rales.     Comments: Decreased air movement at the bases.  No wheezing or crackles. Chest:     Chest wall: No tenderness.  Lymphadenopathy:     Cervical: No cervical adenopathy.  Skin:    Coloration: Skin is not pale.     Findings: No abrasion, erythema, petechiae or rash. Rash is not papular, urticarial or vesicular.  Neurological:     Mental Status: She is alert.  Psychiatric:        Behavior: Behavior is cooperative.     Diagnostic studies: none   Patient received her dose of Fasenra.  She  tolerated this without adverse event.     Salvatore Marvel, MD  [facility]

## 2021-04-15 NOTE — Patient Instructions (Addendum)
1. Moderate persistent asthma, uncomplicated - We did not do lung testing, but we will check next time once you have been on Fasenra for a period of time. [name] given today in clinic  - Daily controller medication(s): Breztri two puffs two puffs twice daily with spacer + Fasenra every month x 3 doses (then every 8 weeks thereafter) - Prior to physical activity: albuterol 2 puffs 10-15 minutes before physical activity. - Rescue medications: albuterol 4 puffs every 4-6 hours as needed - Asthma control goals:  * Full participation in all desired activities (may need albuterol before activity) * Albuterol use two time or less a week on average (not counting use with activity) * Cough interfering with sleep two time or less a month * Oral steroids no more than once a year * No hospitalizations  2. Seasonal and perennial allergic rhinitis (grasses, ragweed, weeds, trees, indoor molds, outdoor molds, dust mites, cat, dog and cockroach) - Continue with: Xyzal (levocetirizine) 5mg  tablet once daily and Astelin (azelastine) 2 sprays per nostril 1-2 times daily as needed - Strongly consider nasal saline rinses 1-2 times daily to remove allergens from the nasal cavities as well as help with mucous clearance (this is especially helpful to do before the nasal sprays are given) - Strongly consider starting allergy shots as a means of long-term control. - CPT codes provided.  3. 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 - is up to date.   4. Ear itching - Start cipro-dex ear drops twice daily and continue for one week.  - The drops contain a steroid and an antibiotic, but I am mostly using it for the steroid. - This should help with inflammation and the itching.   5. Return in about 3 months (around 07/13/2021).    Please inform 09/12/2021 of any Emergency Department visits, hospitalizations, or changes in symptoms. Call us before  going to the ED for breathing or allergy symptoms since we might be able to fit you in for a sick visit. Feel free to contact us anytime with any questions, problems, or concerns.  It was a pleasure to see you again today!  Websites that have reliable patient information: 1. American Academy of Asthma, Allergy, and Immunology: www.aaaai.org 2. Food Allergy Research and Education (FARE): foodallergy.org 3. Mothers of Asthmatics: http://www.asthmacommunitynetwork.org 4. American College of Allergy, Asthma, and Immunology: www.acaai.org   COVID-19 Vaccine Information can be found at: [place] For questions related to vaccine distribution or appointments, please email [email or web address] or call [phone].   We realize that you might be concerned about having an allergic reaction to the COVID19 vaccines. To help with that concern, WE ARE OFFERING THE COVID19 VACCINES IN OUR OFFICE! Ask the front desk for dates!     Like [phone] on [place] and Instagram for our latest updates!      A healthy democracy works best when [employer] participate! Make sure you are registered to vote! If you have moved or changed any of your contact information, you will need to get this updated before voting!  In some cases, you MAY be able to register to vote online: [employer]

## 2021-04-21 DIAGNOSIS — N1831 Chronic kidney disease, stage 3a: Secondary | ICD-10-CM | POA: Diagnosis not present

## 2021-04-21 DIAGNOSIS — Z794 Long term (current) use of insulin: Secondary | ICD-10-CM | POA: Diagnosis not present

## 2021-04-21 DIAGNOSIS — E1122 Type 2 diabetes mellitus with diabetic chronic kidney disease: Secondary | ICD-10-CM | POA: Diagnosis not present

## 2021-04-21 DIAGNOSIS — I10 Essential (primary) hypertension: Secondary | ICD-10-CM | POA: Diagnosis not present

## 2021-04-21 DIAGNOSIS — E782 Mixed hyperlipidemia: Secondary | ICD-10-CM | POA: Diagnosis not present

## 2021-04-27 DIAGNOSIS — I10 Essential (primary) hypertension: Secondary | ICD-10-CM | POA: Diagnosis not present

## 2021-04-27 DIAGNOSIS — E785 Hyperlipidemia, unspecified: Secondary | ICD-10-CM | POA: Diagnosis not present

## 2021-04-27 DIAGNOSIS — E1169 Type 2 diabetes mellitus with other specified complication: Secondary | ICD-10-CM | POA: Diagnosis not present

## 2021-04-27 DIAGNOSIS — N189 Chronic kidney disease, unspecified: Secondary | ICD-10-CM | POA: Diagnosis not present

## 2021-04-28 ENCOUNTER — Encounter: Payer: Self-pay | Admitting: Podiatry

## 2021-04-28 ENCOUNTER — Other Ambulatory Visit: Payer: Self-pay

## 2021-04-28 ENCOUNTER — Ambulatory Visit: Payer: Medicare Other | Admitting: Podiatry

## 2021-04-28 DIAGNOSIS — B351 Tinea unguium: Secondary | ICD-10-CM | POA: Diagnosis not present

## 2021-04-28 DIAGNOSIS — M79674 Pain in right toe(s): Secondary | ICD-10-CM | POA: Diagnosis not present

## 2021-04-28 DIAGNOSIS — M79675 Pain in left toe(s): Secondary | ICD-10-CM | POA: Diagnosis not present

## 2021-04-28 DIAGNOSIS — L84 Corns and callosities: Secondary | ICD-10-CM | POA: Diagnosis not present

## 2021-04-28 DIAGNOSIS — E1142 Type 2 diabetes mellitus with diabetic polyneuropathy: Secondary | ICD-10-CM | POA: Diagnosis not present

## 2021-05-04 NOTE — Progress Notes (Signed)
°  Subjective:  Patient ID: Melissa Chavez, female    DOB: 26-Feb-1961,  MRN: [id number]  Melissa Chavez presents to clinic today for at risk foot care. Pt has h/o NIDDM with chronic kidney disease and callus(es) of both feet and painful thick toenails that are difficult to trim. Painful toenails interfere with ambulation. Aggravating factors include wearing enclosed shoe gear. Pain is relieved with periodic professional debridement. Painful calluses are aggravated when weightbearing with and without shoegear. Pain is relieved with periodic professional debridement..    Patient did not check blood glucose today.  New problem(s): None. She states her A1c has improved from 13 to 7.8.  PCP is Merrilee Seashore, MD , and last visit was April 21, 2021.  Allergies  Allergen Reactions   Fish Allergy Anaphylaxis   Other Anaphylaxis and Rash    Cockroaches, rag weed, pollen,- showed up in allergy test unknown reaction Cockroaches, rag weed, pollen,- showed up in allergy test unknown reaction Cockroaches, rag weed, pollen,- showed up in allergy test unknown reaction   Banana Other (See Comments)    unknown   Empagliflozin Other (See Comments)    Other reaction(s): Unknown   Metformin Other (See Comments)    Other reaction(s): Unknown   Prednisone Other (See Comments)    Anxiety, erratic behavior, ?hallucinations Requiring ED and psychiatric evaluation    Review of Systems: Negative except as noted in the HPI. Objective:   Constitutional Melissa Chavez is a pleasant 61 y.o. African American female, obese in NAD. AAO x 3.   Vascular Capillary refill time to digits immediate b/l. Palpable pedal pulses b/l LE. Pedal hair present. No pain with calf compression b/l. Lower extremity skin temperature gradient within normal limits. No edema noted b/l LE. No ischemia or gangrene noted b/l LE. No cyanosis or clubbing noted b/l LE.  Neurologic Normal speech. Oriented to person, place, and time.  Pt has subjective symptoms of neuropathy. Protective sensation intact 5/5 intact bilaterally with 10g monofilament b/l. Vibratory sensation intact b/l.  Dermatologic Pedal integument with normal turgor, texture and tone BLE. No open wounds b/l LE. No interdigital macerations noted b/l LE. Toenails 1-5 b/l elongated, discolored, dystrophic, thickened, crumbly with subungual debris and tenderness to dorsal palpation. Hyperkeratotic lesion(s) R hallux, submet head 1 right foot, and submet head 5 b/l.  No erythema, no edema, no drainage, no fluctuance.  Orthopedic: Muscle strength 5/5 to all lower extremity muscle groups bilaterally. HAV with bunion deformity noted b/l LE. Hammertoe deformity noted 2-5 b/l.   Radiographs: None  Last A1c: No flowsheet data found.   Assessment:   1. Pain due to onychomycosis of toenails of both feet   2. Callus   3. Type 2 diabetes mellitus with diabetic polyneuropathy, without long-term current use of insulin ([place])    Plan:  Patient was evaluated and treated and all questions answered. Consent given for treatment as described below: -Mycotic toenails 1-5 bilaterally were debrided in length and girth with sterile nail nippers and dremel without incident. -Callus(es) R hallux, submet head 1 right foot, and submet head 5 b/l pared utilizing sterile scalpel blade without complication or incident. Total number debrided =4. -Patient/POA to call should there be question/concern in the interim.  Return in about 3 months (around 07/26/2021).  Marzetta Board, DPM

## 2021-05-13 ENCOUNTER — Telehealth: Payer: Self-pay

## 2021-05-13 DIAGNOSIS — J302 Other seasonal allergic rhinitis: Secondary | ICD-10-CM

## 2021-05-13 NOTE — Telephone Encounter (Signed)
Allergy shot scripts written.  ? ?Malachi Bonds, MD ?[facility] ? ?

## 2021-05-13 NOTE — Telephone Encounter (Signed)
Patient called and is requesting to speak to Melissa Chavez.  ? ? ?

## 2021-05-17 ENCOUNTER — Ambulatory Visit (INDEPENDENT_AMBULATORY_CARE_PROVIDER_SITE_OTHER): Payer: Medicare Other

## 2021-05-17 ENCOUNTER — Other Ambulatory Visit: Payer: Self-pay

## 2021-05-17 DIAGNOSIS — J3081 Allergic rhinitis due to animal (cat) (dog) hair and dander: Secondary | ICD-10-CM | POA: Diagnosis not present

## 2021-05-17 DIAGNOSIS — J4550 Severe persistent asthma, uncomplicated: Secondary | ICD-10-CM | POA: Diagnosis not present

## 2021-05-17 NOTE — Progress Notes (Signed)
Aeroallergen Immunotherapy  ? ?Ordering Provider: Dr. Salvatore Marvel  ? ?Patient Details  ?Name: Melissa Chavez  ?MRN: [id number]  ?Date of Birth: Nov 02, 1960  ? ?Order 1 of 2  ? ?Vial Label: G/W/T/C/D  ? ?0.3 ml (Volume)  BAU Concentration -- 7 Grass Mix* 100,000 ([address] [place], [place], [place], [place], [place], Sweet [place], Christia [place])  ?0.3 ml (Volume)  BAU Concentration -- [place] 10,000  ?0.5 ml (Volume)  1:20 Concentration -- Weed Mix*  ?0.5 ml (Volume)  1:20 Concentration -- Eastern 10 Tree Mix (also Sweet Gum)  ?0.1 ml (Volume)  1:10 Concentration -- Wendee Copp mix*  ?0.1 ml (Volume)  1:20 Concentration -- Steva Colder American*  ?0.1 ml (Volume)  1:20 Concentration -- Elm Mix*  ?0.1 ml (Volume)  1:10 Concentration -- Oak, [place] mix*  ?0.2 ml (Volume)  1:10 Concentration -- Pecan Pollen  ?0.5 ml (Volume)  1:10 Concentration -- Cat Hair  ?0.5 ml (Volume)  1:10 Concentration -- Dog Epithelia  ? ? ?3.2  ml Extract Subtotal  ?1.8  ml Diluent  ?5.0  ml Maintenance Total  ? ?Schedule:  A  ? ?Blue Vial (1:100,000): Schedule A (10 doses)  ?Yellow Vial (1:10,000): Schedule A (10 doses)  ?Green Vial (1:1,000): Schedule A (10 doses)  ?Red Vial (1:100): Schedule A (10 doses)  ? ?Special Instructions: none ?

## 2021-05-17 NOTE — Progress Notes (Signed)
Aeroallergen Immunotherapy  ? ?Ordering Provider: Dr. Salvatore Marvel  ? ?Patient Details  ?Name: MCKENZIE DUHR  ?MRN: [id number]  ?Date of Birth: 1960-05-27  ? ?Order 2 of 2  ? ?Vial Label: RW/Molds/DM/CR  ? ?0.3 ml (Volume)  1:20 Concentration -- Ragweed Mix  ?0.2 ml (Volume)  1:20 Concentration -- Cladosporium herbarum  ?0.2 ml (Volume)  1:10 Concentration -- Penicillium mix  ?0.2 ml (Volume)  1:20 Concentration -- Drechslera spicifera  ?0.2 ml (Volume)  1:10 Concentration -- Mucor plumbeus  ?0.2 ml (Volume)  1:10 Concentration -- Fusarium moniliforme  ?0.2 ml (Volume)  1:40 Concentration -- Aureobasidium pullulans  ?0.2 ml (Volume)  1:40 Concentration -- Epicoccum nigrum  ?0.3 ml (Volume)  1:20 Concentration -- Cockroach, [place]  ?0.5 ml (Volume)   AU Concentration -- Mite Mix (DF 5,000 & DP 5,000)  ? ? ?2.5  ml Extract Subtotal  ?2.5  ml Diluent  ?5.0  ml Maintenance Total  ? ?Schedule:  A  ? ?Blue Vial (1:100,000): Schedule A (10 doses)  ?Yellow Vial (1:10,000): Schedule A (10 doses)  ?Green Vial (1:1,000): Schedule A (10 doses)  ?Red Vial (1:100): Schedule A (10 doses)  ? ?Special Instructions: none ?

## 2021-05-17 NOTE — Progress Notes (Signed)
VIALS EXP 05-18-22 ?

## 2021-05-18 DIAGNOSIS — J3089 Other allergic rhinitis: Secondary | ICD-10-CM | POA: Diagnosis not present

## 2021-06-04 ENCOUNTER — Ambulatory Visit (INDEPENDENT_AMBULATORY_CARE_PROVIDER_SITE_OTHER): Payer: Medicare Other

## 2021-06-04 DIAGNOSIS — J309 Allergic rhinitis, unspecified: Secondary | ICD-10-CM

## 2021-06-04 NOTE — Progress Notes (Signed)
Immunotherapy ? ? ?Patient Details  ?Name: Melissa Chavez ?MRN: [id number] ?Date of Birth: 05-Aug-1960 ? ?06/04/2021 ? ?[name] started injections for  G-W-T-C-D and RW-MOLDS-DM-CR ?Following schedule: A  ?Frequency:1 time per week ?Epi-Pen:Epi-Pen Available  ?Consent signed and patient instructions given. Patient waited in office for 30 minutes without any issues.  ? ? ?Guy Franco ?06/04/2021, 2:25 PM ? ? ?

## 2021-06-10 ENCOUNTER — Ambulatory Visit (INDEPENDENT_AMBULATORY_CARE_PROVIDER_SITE_OTHER): Payer: Medicare Other

## 2021-06-10 DIAGNOSIS — J309 Allergic rhinitis, unspecified: Secondary | ICD-10-CM | POA: Diagnosis not present

## 2021-06-14 ENCOUNTER — Ambulatory Visit (INDEPENDENT_AMBULATORY_CARE_PROVIDER_SITE_OTHER): Payer: Medicare Other

## 2021-06-14 DIAGNOSIS — J4550 Severe persistent asthma, uncomplicated: Secondary | ICD-10-CM

## 2021-06-16 ENCOUNTER — Ambulatory Visit (INDEPENDENT_AMBULATORY_CARE_PROVIDER_SITE_OTHER): Payer: Medicare Other | Admitting: *Deleted

## 2021-06-16 DIAGNOSIS — J309 Allergic rhinitis, unspecified: Secondary | ICD-10-CM

## 2021-06-17 ENCOUNTER — Telehealth: Payer: Self-pay

## 2021-06-17 NOTE — Telephone Encounter (Signed)
Patient called because she has been having issues with the shots she received on 06/16/21.  She said both arms are itchy and the shot site is the size of a quarter. She received 0.15 for both shots. She took xyzal this morning. I recommended she take either benadryl or another xyzal to help with the allergic reaction. She used used neosporin this morning also. Please advise on any other recommendations.  ?

## 2021-06-18 MED ORDER — TRIAMCINOLONE ACETONIDE 0.1 % EX OINT: TOPICAL_OINTMENT | CUTANEOUS | 1 refills | Status: DC

## 2021-06-18 NOTE — Telephone Encounter (Signed)
Called patient - DOB/Pharmacy verified - advised of provider's notation. Patient verbalized understanding, no further questions at this time. ?  ?Electronically sent Triamcinolone ointment 0.1% , applying 3-4 times daily for 2 days after each injection to help with inflammation to pharmacy on file. ? ?Epinephrine rinses will be added to patient's immunotherapy chart as well at no additional cost to her - will also assist with the inflammation and allergic reaction. ? ? ? ?

## 2021-06-18 NOTE — Telephone Encounter (Signed)
She should be fine. This is just topical. Please tell her not to eat it.  ? ?Malachi Bonds, MD ?[facility] ? ?

## 2021-06-18 NOTE — Telephone Encounter (Signed)
Let's add on triamcinolone ointment 0.1% 3-4 times daily for 2 days after each injection to help with inflammation. ? ?Agree with extra dose of antihistamine as well. ? ?Do we need to add epinephrine rinses?  ? ?Malachi Bonds, MD ?[facility] ? ?

## 2021-06-23 DIAGNOSIS — I10 Essential (primary) hypertension: Secondary | ICD-10-CM | POA: Diagnosis not present

## 2021-06-23 DIAGNOSIS — E1169 Type 2 diabetes mellitus with other specified complication: Secondary | ICD-10-CM | POA: Diagnosis not present

## 2021-06-23 DIAGNOSIS — E785 Hyperlipidemia, unspecified: Secondary | ICD-10-CM | POA: Diagnosis not present

## 2021-06-23 DIAGNOSIS — N189 Chronic kidney disease, unspecified: Secondary | ICD-10-CM | POA: Diagnosis not present

## 2021-06-24 ENCOUNTER — Telehealth: Payer: Self-pay | Admitting: *Deleted

## 2021-06-24 DIAGNOSIS — T7840XD Allergy, unspecified, subsequent encounter: Secondary | ICD-10-CM

## 2021-06-24 DIAGNOSIS — R5383 Other fatigue: Secondary | ICD-10-CM

## 2021-06-24 NOTE — Telephone Encounter (Signed)
Patient called and spoke with [place] and stated that she went to go to her landlords office to pay her rent and a black rabbit ran out in front of her and scared her. She stated that she then had an allergic reaction to the rabbit. She came in yesterday to get her injection but it was after the injection room was closed. She mentioned the rabbit situation to [facility] and she mention to the patient that she could be allergic to it. Caryl Pina looked into her chart and she is not allergic to rabbit and it is not in her allergy vials. Patient would like a letter stating that she is allergic to animals and does not need to have them around her so that they will not have the rabbit in the office. She then stated that she would be fine with the rabbit had she known that it was there. Caryl Pina reiterated the story back to [place] to confirm and Liyah then stated that she was not going to repeat herself.  ?

## 2021-06-25 NOTE — Telephone Encounter (Signed)
What an interesting story. I am sending a rabbit IgE to see if she is allergic to rabbit at all.  ? ?Salvatore Marvel, MD ?[facility] [place] [facility] ? ?

## 2021-06-25 NOTE — Telephone Encounter (Signed)
Called and spoke with patient and advised. Patient verbalized understanding and will come in when she is feeling better to get labs drawn. She states for now she does not need the letter. Lab requisition has been printed and given to [place].  ?

## 2021-06-25 NOTE — Addendum Note (Signed)
Addended by: Alfonse Spruce on: 06/25/2021 09:22 AM ? ? Modules accepted: Orders ? ?

## 2021-06-29 ENCOUNTER — Ambulatory Visit (INDEPENDENT_AMBULATORY_CARE_PROVIDER_SITE_OTHER): Payer: Medicare Other

## 2021-06-29 ENCOUNTER — Telehealth: Payer: Self-pay | Admitting: Allergy & Immunology

## 2021-06-29 DIAGNOSIS — T7840XD Allergy, unspecified, subsequent encounter: Secondary | ICD-10-CM | POA: Diagnosis not present

## 2021-06-29 DIAGNOSIS — J309 Allergic rhinitis, unspecified: Secondary | ICD-10-CM

## 2021-06-29 DIAGNOSIS — R5383 Other fatigue: Secondary | ICD-10-CM | POA: Diagnosis not present

## 2021-06-29 NOTE — Telephone Encounter (Signed)
Spoken to patient and she stated that she inform Patsy that to let Dr Dellis Anes know that she believes she is allergic to rabbit.  ? ?She stated the triamcinolone ointment is the not relieving the itchy and red injection site. She mention another cream or ointment that was given to her by another provider but can't remember the name. I asked if it was something like hydrocortisone and she said no because that is over the counter. ? ?So she wanted me to stop talking and send the message to Dr Dellis Anes. ?

## 2021-06-29 NOTE — Telephone Encounter (Signed)
Patient called and said that the cream for the itching is not helping. [phone] ?

## 2021-06-29 NOTE — Addendum Note (Signed)
Addended by: Alfonse Spruce on: 06/29/2021 09:18 AM ? ? Modules accepted: Orders ? ?

## 2021-06-30 LAB — CBC WITH DIFFERENTIAL
Basophils Absolute: 0 10*3/uL (ref 0.0–0.2)
Basos: 0 %
EOS (ABSOLUTE): 0 10*3/uL (ref 0.0–0.4)
Eos: 0 %
Hematocrit: 36.8 % (ref 34.0–46.6)
Hemoglobin: 12.3 g/dL (ref 11.1–15.9)
Immature Grans (Abs): 0 10*3/uL (ref 0.0–0.1)
Immature Granulocytes: 0 %
Lymphocytes Absolute: 3.4 10*3/uL — ABNORMAL HIGH (ref 0.7–3.1)
Lymphs: 39 %
MCH: 26.7 pg (ref 26.6–33.0)
MCHC: 33.4 g/dL (ref 31.5–35.7)
MCV: 80 fL (ref 79–97)
Monocytes Absolute: 0.5 10*3/uL (ref 0.1–0.9)
Monocytes: 6 %
Neutrophils Absolute: 4.8 10*3/uL (ref 1.4–7.0)
Neutrophils: 55 %
RBC: 4.61 x10E6/uL (ref 3.77–5.28)
RDW: 15.7 % — ABNORMAL HIGH (ref 11.7–15.4)
WBC: 8.7 10*3/uL (ref 3.4–10.8)

## 2021-06-30 LAB — IRON,TIBC AND FERRITIN PANEL
Ferritin: 27 ng/mL (ref 15–150)
Iron Saturation: 12 % — ABNORMAL LOW (ref 15–55)
Iron: 35 ug/dL (ref 27–139)
Total Iron Binding Capacity: 285 ug/dL (ref 250–450)
UIBC: 250 ug/dL (ref 118–369)

## 2021-06-30 NOTE — Telephone Encounter (Signed)
Patient contacted me after hours asked whether she could use triamcinolone with hydroxyzine.  Before I could get back to her, she told me that she talk to the pharmacist and the pharmacist said it was okay.  She then sent me a picture congratulating Ashleigh on her baby.  She asked me to send this picture to Ashleigh, which I did. ? ?Thankfully, we did not discuss the rabbit. ? ?Malachi Bonds, MD ?[facility] ? ?

## 2021-06-30 NOTE — Telephone Encounter (Signed)
Patient called and stated that she was having issues with her sinuses she believes. Patient stated that she call our office to try to get an appointment to be seen and was told she would have to wait until tomorrow. Patient was not pleased and stated she went to urgent care to find help and was turned away until tomorrow due to them being full. I informed patient that I would send Dr. Dellis Anes a message to see what medications we could send in of she could express her symptoms. Patient refused stating that the medications thus far have not worked and she needs medical attention. Patient asked what would Dr. Dellis Anes normally say or o for his patients of he was not able to see them that day. I informed patient that if they could not be seen in office he would refer patient to the nearest ED or UC. Patient was frustrated and expressed that she may have to travel to [facility] to gain proper attention. I expressed to patient that she could call our office back if she needed help further.  ?

## 2021-06-30 NOTE — Telephone Encounter (Signed)
We are happy to help her transfer her care, if that is what she desires.  ? ?Salvatore Marvel, MD ?[facility] [place] [facility] ? ?

## 2021-06-30 NOTE — Telephone Encounter (Signed)
Noted  

## 2021-07-01 LAB — E082-IGE RABBIT EPITHELIA: Rabbit Epithelia IgE: 0.42 kU/L — AB

## 2021-07-02 ENCOUNTER — Other Ambulatory Visit: Payer: Self-pay

## 2021-07-02 ENCOUNTER — Telehealth: Payer: Self-pay | Admitting: Allergy & Immunology

## 2021-07-02 ENCOUNTER — Telehealth: Payer: Self-pay

## 2021-07-02 ENCOUNTER — Encounter (HOSPITAL_BASED_OUTPATIENT_CLINIC_OR_DEPARTMENT_OTHER): Payer: Self-pay

## 2021-07-02 DIAGNOSIS — Z7984 Long term (current) use of oral hypoglycemic drugs: Secondary | ICD-10-CM | POA: Diagnosis not present

## 2021-07-02 DIAGNOSIS — E119 Type 2 diabetes mellitus without complications: Secondary | ICD-10-CM | POA: Insufficient documentation

## 2021-07-02 DIAGNOSIS — Z794 Long term (current) use of insulin: Secondary | ICD-10-CM | POA: Insufficient documentation

## 2021-07-02 DIAGNOSIS — M79672 Pain in left foot: Secondary | ICD-10-CM | POA: Insufficient documentation

## 2021-07-02 DIAGNOSIS — M109 Gout, unspecified: Secondary | ICD-10-CM | POA: Insufficient documentation

## 2021-07-02 DIAGNOSIS — Z79899 Other long term (current) drug therapy: Secondary | ICD-10-CM | POA: Diagnosis not present

## 2021-07-02 NOTE — ED Triage Notes (Signed)
Pt presented via triage with c/o having a pain noted to L foot after she stated a rabbit ran cross her both of her feet around [place] day. Pt stated that she is unsure if the rabbit bite her foot or not but she noticed a increase in discomfort after the incident. Pt admits to L foot being sensitive to touch and difficulty baring weight on that foot. ? ?Pt admits to recently having allergy test completed and that she has an allergy to rabbits and was informed to come in for further evaluation.  ?

## 2021-07-02 NOTE — Telephone Encounter (Signed)
Surprisingly, rabbit is actually positive.  I can write a letter, but I do not have time today.  Please tell her to avoid the landlord office. ? ?I sent a CBC and iron studies per patient request.  We can forward those results to her PCP. ? ?Malachi Bonds, MD ?[facility] ? ?

## 2021-07-02 NOTE — Telephone Encounter (Signed)
Melissa Chavez called in and would like her lab results please ? ?

## 2021-07-02 NOTE — Telephone Encounter (Signed)
Thanks!  She requested the CBC and iron studies.  They are not indicated from anything are managing.  I will work on the letter this weekend. ?

## 2021-07-02 NOTE — Telephone Encounter (Signed)
Patient called and was giving me a difficult time when asked the reason for her call. She saw that her blood work posted today. I did inform her that Dr. Ernst Bowler has not review her results yet. I went over the process of how we go over the results with the patient and she refused to have a nurse call her back to go over results. She wants Dr. Ernst Bowler to e-mail her about her results vs. Communication in [place].  ? ?She then asked me to transfer her to [place]. I asked her what it was in regards to so that I can better assist her and she got an attitude. I tried to ask her what appointment she needed to reschedule and she had an attitude that I was asking for information. She does not what me to assist her with anything regarding her allergy and asthma care.  ? ?

## 2021-07-02 NOTE — Telephone Encounter (Signed)
If she is going to keep treating the staff like this, she can be dismissed.  Routing to [place] to keep her in the know. ? ?Malachi Bonds, MD ?[facility] ? ?

## 2021-07-03 ENCOUNTER — Emergency Department (HOSPITAL_BASED_OUTPATIENT_CLINIC_OR_DEPARTMENT_OTHER): Payer: Medicare Other

## 2021-07-03 ENCOUNTER — Emergency Department (HOSPITAL_BASED_OUTPATIENT_CLINIC_OR_DEPARTMENT_OTHER)
Admission: EM | Admit: 2021-07-03 | Discharge: 2021-07-03 | Disposition: A | Discharge status: Home / Self Care | Payer: Medicare Other | Attending: Emergency Medicine | Admitting: Emergency Medicine

## 2021-07-03 DIAGNOSIS — S99922A Unspecified injury of left foot, initial encounter: Secondary | ICD-10-CM | POA: Diagnosis not present

## 2021-07-03 DIAGNOSIS — M79672 Pain in left foot: Secondary | ICD-10-CM

## 2021-07-03 DIAGNOSIS — M7989 Other specified soft tissue disorders: Secondary | ICD-10-CM | POA: Diagnosis not present

## 2021-07-03 LAB — CBG MONITORING, ED: Glucose-Capillary: 123 mg/dL — ABNORMAL HIGH (ref 70–99)

## 2021-07-03 MED ORDER — NAPROXEN 250 MG PO TABS: 500.0000 mg | ORAL_TABLET | Freq: Once | ORAL | Status: DC

## 2021-07-03 NOTE — ED Provider Notes (Signed)
? ?[facility] EMERGENCY DEPT  ?Provider Note ? ?CSN: [id number] ?Arrival date & time: 07/02/21 2246 ? ?History ?Chief Complaint  ?Patient presents with  ? Foot Pain  ? ? ?Melissa Chavez is a 61 y.o. female here for evaluation of L foot pain she tells me started 2-3 days ago, no falls or injuries worse with standing or bearing weight. Minimal improvement with APAP. Per triage note started 3 weeks ago after a large rabbit ran across her feet. She has multiple other allergies, followed by allergist and getting monthly allergy shots. Apparently her Allergist did a Rabbit Allergy test which came back positive. Tonight she called her Insurance Nursing line and was advised to come to the ED because her foot pain could be 'a delayed reaction to the rabbit'. She has not had a rash, itching or swelling. She denies history of neuropathy. She does have history of gout.  ? ? ?Home Medications ?Prior to Admission medications   ?Medication Sig Start Date End Date Taking? Authorizing Provider  ?ACCU-CHEK SOFTCLIX LANCETS lancets TEST BLOOD SUGAR ONCE DAILY 10/10/16   Wardell Honour, MD  ?albuterol (PROVENTIL) (2.5 MG/3ML) 0.083% nebulizer solution Take 3 mLs (2.5 mg total) by nebulization every 6 (six) hours as needed. 08/31/20   Valentina Shaggy, MD  ?azelastine (ASTELIN) 0.1 % nasal spray Place 2 sprays into both nostrils 2 (two) times daily. ?Patient taking differently: Place 2 sprays into both nostrils 2 (two) times daily as needed. 01/08/20   Valentina Shaggy, MD  ?Benralizumab [facility] PEN) 30 MG/ML SOAJ Inject 1 mL (30 mg total) into the skin every 28 (twenty-eight) days. For 3 doses then every 8 weeks 03/11/21   Valentina Shaggy, MD  ?Blood Glucose Calibration (ACCU-CHEK AVIVA) SOLN Test blood sugar once daily. Dx: E11.42 10/21/15   Wardell Honour, MD  ?blood glucose meter kit and supplies KIT Use as directed 4 times a day.  DX: R73.09 06/13/14   Shawnee Knapp, MD  ?Blood Glucose Monitoring Suppl  (ACCU-CHEK AVIVA PLUS) w/Device KIT TEST BLOOD SUGAR ONCE  DAILY. 08/23/16   Forrest Moron, MD  ?Judithann Sauger AEROSPHERE 160-9-4.8 MCG/ACT AERO Inhale 2 puffs into the lungs in the morning and at bedtime. 04/15/21   Valentina Shaggy, MD  ?Continuous Blood Gluc Sensor (FREESTYLE LIBRE 2 SENSOR) MISC See admin instructions. ?Patient not taking: Reported on 04/15/2021 01/21/21   [provider]  ?dapagliflozin propanediol (FARXIGA) 5 MG TABS tablet 1 tablet    [provider]  ?enalapril (VASOTEC) 20 MG tablet Take 20 mg by mouth daily. 12/21/20   [provider]  ?EPINEPHrine 0.3 mg/0.3 mL IJ SOAJ injection ADMINISTER 0.3 MG IN THE MUSCLE 1 TIME FOR 1 DOSE 03/19/20   [provider]  ?febuxostat (ULORIC) 40 MG tablet Take 40 mg by mouth daily.    [provider]  ?glucose blood (ACCU-CHEK AVIVA PLUS) test strip Test blood sugar once daily. Dx: E11.42 10/21/15   Wardell Honour, MD  ?hydrOXYzine (ATARAX/VISTARIL) 25 MG tablet 1 tablet as needed    [provider]  ?insulin degludec (TRESIBA FLEXTOUCH) 100 UNIT/ML FlexTouch Pen 20 units    [provider]  ?Insulin Pen Needle (B-D UF III MINI PEN NEEDLES) 31G X 5 MM MISC See admin instructions. 11/05/20   [provider]  ?levocetirizine (XYZAL) 5 MG tablet Take 1 tablet (5 mg total) by mouth daily. 04/15/21   Valentina Shaggy, MD  ?methocarbamol (ROBAXIN) 750 MG tablet Take 750  mg by mouth as needed for muscle spasms.    [provider]  ?montelukast (SINGULAIR) 10 MG tablet 1 tablet 12/28/20   [provider]  ?Multiple Vitamins-Minerals (CENTRUM SILVER 50+WOMEN PO) Take 1 tablet by mouth daily.    [provider]  ?omeprazole (PRILOSEC) 40 MG capsule 1 capsule 30 minutes before morning meal    [provider]  ?oxybutynin (DITROPAN XL) 15 MG 24 hr tablet TAKE 1 TABLET BY MOUTH AT  BEDTIME 03/01/16   Harrison Mons, PA  ?PARoxetine (PAXIL) 10 MG tablet Take 10 mg  by mouth daily. 10/20/15   [provider]  ?rosuvastatin (CRESTOR) 10 MG tablet Take 10 mg by mouth daily.    [provider]  ?Semaglutide,0.25 or 0.5MG/DOS, (OZEMPIC, 0.25 OR 0.5 MG/DOSE,) 2 MG/1.5ML SOPN See admin instructions. 01/07/21   [provider]  ?triamcinolone ointment (KENALOG) 0.1 % Apply 3-4 times daily for 2 days after each injection to affected areas. 06/18/21   Valentina Shaggy, MD  ?triamterene-hydrochlorothiazide (MAXZIDE-25) 37.5-25 MG tablet Take 1 tablet by mouth daily.  07/20/15   [provider]  ?VENTOLIN HFA 108 (90 Base) MCG/ACT inhaler USE 2 INHALATIONS BY MOUTH  EVERY 6 HOURS AS NEEDED FOR WHEEZING 02/16/21   Valentina Shaggy, MD  ? ? ? ?Allergies    ?Fish allergy, Other, Banana, Empagliflozin, Metformin, and Prednisone ? ? ?Review of Systems   ?Review of Systems ?Please see HPI for pertinent positives and negatives ? ?Physical Exam ?BP 122/60   Pulse 78   Temp 98.1 ?F (36.7 ?C)   Resp 18   Ht _0  (1.575 m)   Wt 90.3 kg   LMP 05/30/2013   SpO2 99%   BMI 36.40 kg/m?  ? ?Physical Exam ?Vitals and nursing note reviewed.  ?HENT:  ?   Head: Normocephalic.  ?   Nose: Nose normal.  ?Eyes:  ?   Extraocular Movements: Extraocular movements intact.  ?Pulmonary:  ?   Effort: Pulmonary effort is normal.  ?Musculoskeletal:     ?   General: Tenderness (L dorsal foot) present. Normal range of motion.  ?   Cervical back: Neck supple.  ?   Comments: No signs of allergic reaction, inflammation or injury to the L foot  ?Skin: ?   Findings: No rash (on exposed skin).  ?Neurological:  ?   Mental Status: She is alert and oriented to person, place, and time.  ?Psychiatric:     ?   Mood and Affect: Mood normal.  ? ? ?ED Results / Procedures / Treatments   ?EKG ?None ? ?Procedures ?Procedures ? ?Medications Ordered in the ED ?Medications - No data to display ? ?Initial Impression and Plan ? Patient with L foot pain, nonspecific. Not related to the rabbit  exposure she reports happened 3 weeks ago. She has been taking APAP for the pain which she states is the only thing she is allowed to take. Will check xray. Advised that she will need continued outpatient management. Could be mild gout or diabetic neuropathy as well.  ? ?ED Course  ? ?Clinical Course as of 07/03/21 0300  ?Sat Jul 03, 2021  ?0257 I personally viewed the images from radiology studies and agree with radiologist interpretation: Xray neg for fracture, degenerative changes likely from gout. She has colchicine she can take at home. Will avoid steroids given her prior reaction and DM. Recommend she follow up with PCP.  ? [CS]  ?  ?Clinical Course User Index ?[CS]  Truddie Hidden, MD  ? ? ? ?MDM Rules/Calculators/A&P ?Medical Decision Making ?Problems Addressed: ?Left foot pain: acute illness or injury ? ?Amount and/or Complexity of Data Reviewed ?Radiology: ordered and independent interpretation performed. Decision-making details documented in ED Course. ? ?Risk ?Prescription drug management. ? ? ? ?Final Clinical Impression(s) / ED Diagnoses ?Final diagnoses:  ?Left foot pain  ? ? ?Rx / DC Orders ?ED Discharge Orders   ? ? None  ? ?  ? ?  ?Truddie Hidden, MD ?07/03/21 0300 ? ?

## 2021-07-03 NOTE — ED Notes (Signed)
X-ray at bedside

## 2021-07-03 NOTE — Discharge Instructions (Addendum)
Please take your colchicine as prescribed for gout flares. Continue tylenol for pain. Follow up with your primary doctor next week.  ?

## 2021-07-03 NOTE — ED Notes (Signed)
Ace bandage then post-op shoe to L foot; PMS intact.  Pt declined offer for crutches -- able to ambulate independently with slow gait; slight limp.  Pt has received verbal and written d/c instructions by this nurse; acknowledges verbal understanding; denies any additional questions, concerns, needs -- now waiting in lobby for transportation home  ?

## 2021-07-05 ENCOUNTER — Telehealth: Payer: Self-pay | Admitting: Orthopaedic Surgery

## 2021-07-05 DIAGNOSIS — I10 Essential (primary) hypertension: Secondary | ICD-10-CM | POA: Diagnosis not present

## 2021-07-05 DIAGNOSIS — E1169 Type 2 diabetes mellitus with other specified complication: Secondary | ICD-10-CM | POA: Diagnosis not present

## 2021-07-05 DIAGNOSIS — E785 Hyperlipidemia, unspecified: Secondary | ICD-10-CM | POA: Diagnosis not present

## 2021-07-05 DIAGNOSIS — N189 Chronic kidney disease, unspecified: Secondary | ICD-10-CM | POA: Diagnosis not present

## 2021-07-05 NOTE — Telephone Encounter (Signed)
Pt had the gel shot, schd again for June ? ?Pt had a rabbit run across her foot, and she has done something to her knee and foot, its been hurting since --I offered an appt ? ?She wants to speak with nurse  ?

## 2021-07-06 NOTE — Telephone Encounter (Signed)
Wrote letter. Can someone call her to let her know that her letter is written?  ? ?Salvatore Marvel, MD ?[facility] [place] [facility] ? ?

## 2021-07-07 NOTE — Telephone Encounter (Signed)
Called and notified letter is ready to be pick up. Patient verbalized understanding. ?

## 2021-07-08 ENCOUNTER — Ambulatory Visit (INDEPENDENT_AMBULATORY_CARE_PROVIDER_SITE_OTHER): Payer: Medicare Other

## 2021-07-08 DIAGNOSIS — J309 Allergic rhinitis, unspecified: Secondary | ICD-10-CM | POA: Diagnosis not present

## 2021-07-08 DIAGNOSIS — G8929 Other chronic pain: Secondary | ICD-10-CM | POA: Diagnosis not present

## 2021-07-08 DIAGNOSIS — M25562 Pain in left knee: Secondary | ICD-10-CM | POA: Diagnosis not present

## 2021-07-13 ENCOUNTER — Encounter: Payer: Self-pay | Admitting: Allergy & Immunology

## 2021-07-13 ENCOUNTER — Ambulatory Visit (INDEPENDENT_AMBULATORY_CARE_PROVIDER_SITE_OTHER): Payer: Medicare Other | Admitting: Allergy & Immunology

## 2021-07-13 VITALS — BP 134/70 | HR 88 | Temp 97.7°F | Resp 16 | Wt 202.8 lb

## 2021-07-13 DIAGNOSIS — J3089 Other allergic rhinitis: Secondary | ICD-10-CM

## 2021-07-13 DIAGNOSIS — J4550 Severe persistent asthma, uncomplicated: Secondary | ICD-10-CM | POA: Diagnosis not present

## 2021-07-13 DIAGNOSIS — J302 Other seasonal allergic rhinitis: Secondary | ICD-10-CM

## 2021-07-13 DIAGNOSIS — T7800XD Anaphylactic reaction due to unspecified food, subsequent encounter: Secondary | ICD-10-CM

## 2021-07-13 DIAGNOSIS — J984 Other disorders of lung: Secondary | ICD-10-CM | POA: Diagnosis not present

## 2021-07-13 MED ORDER — BEPOTASTINE BESILATE 1.5 % OP SOLN: 1.0000 [drp] | Freq: Every day | OPHTHALMIC | 5 refills | Status: DC | PRN

## 2021-07-13 MED ORDER — PIMECROLIMUS 1 % EX CREA: TOPICAL_CREAM | Freq: Two times a day (BID) | CUTANEOUS | 2 refills | Status: DC

## 2021-07-13 NOTE — Progress Notes (Signed)
? ?FOLLOW UP ? ?Date of Service/Encounter:  07/13/21 ? ? ?Assessment:  ? ?Moderate persistent asthma, uncomplicated - started [name] today ?  ?Seasonal and perennial allergic rhinitis (grasses, ragweed, weeds, trees, indoor molds, outdoor molds, dust mites, cat, dog, cockroach, and rabbit) - on allergen immunotherapy ?  ?Anaphylactic shock due to food (catfish, bass, trout, flounder, and cod)  ?  ?Eosinophilia - down to 600 as of the last time we checked ?  ?Elevated troponin - with normal EKG and echocardiogram ?  ?Elevated vitamin B12 - stopped supplementation ?  ?Ear itching ?  ? ?Plan/Recommendations:  ? ?1. Moderate persistent asthma, uncomplicated ?- We did not do lung testing today. ?- We will check at the next visit.  ?- We are not going to make any medication changes.  ?- Daily controller medication(s): Breztri two puffs two puffs twice daily with spacer + Fasenra every 8 weeks thereafter ?- We will work on getting you to get the [place] at home (since you give yourself diabetic drugs).  ?- Prior to physical activity: albuterol 2 puffs 10-15 minutes before physical activity. ?- Rescue medications: albuterol 4 puffs every 4-6 hours as needed ?- Asthma control goals:  ?* Full participation in all desired activities (may need albuterol before activity) ?* Albuterol use two time or less a week on average (not counting use with activity) ?* Cough interfering with sleep two time or less a month ?* Oral steroids no more than once a year ?* No hospitalizations ? ?2. Seasonal and perennial allergic rhinitis (grasses, ragweed, weeds, trees, indoor molds, outdoor molds, dust mites, cat, dog and cockroach) ?- Continue with: Xyzal (levocetirizine) 5mg  tablet once daily and Astelin (azelastine) 2 sprays per nostril 1-2 times daily as needed ?- Continue with allergy shots at the same schedule.  ? ?3. Anaphylactic shock due to food (catfish, bass, trout, flounder, and cod) ?- Continue to avoid triggering foods. ?- Wynona Luna  is up to date.  ? ?4. Eczema  ?- You should not use the triamcinolone cream on your face at all.  ?- We can use Elidel twice daily as needed instead.  ? ?5. Return in about 6 months (around 01/13/2022).  ? ? ?Subjective:  ? ?Melissa Chavez is a 61 y.o. female presenting today for follow up of  ?Chief Complaint  ?Patient presents with  ? Asthma  ?  Moderate, didn't take her breathing treatment this morning.  ? Allergic Rhinitis   ?  Some times they are better and some times they are worse  ? Other  ?  Wants to know if the triamcinolone can go on her face.  ?Wants to speak to the head person of the office. Reason is unknown.  ? ? ?Melissa Chavez has a history of the following: ?Patient Active Problem List  ? Diagnosis Date Noted  ? Chronic kidney disease 01/18/2021  ? Chronic kidney disease, stage 3a ([place]) 01/18/2021  ? DDD (degenerative disc disease), cervical 01/18/2021  ? Diabetic renal disease ([place]) 01/18/2021  ? Idiopathic gout 01/18/2021  ? Long term (current) use of insulin ([place]) 01/18/2021  ? Hyperlipidemia, unspecified 01/18/2021  ? Morbid obesity ([place]) 01/18/2021  ? Unilateral primary osteoarthritis, left knee 09/13/2016  ? Unilateral primary osteoarthritis, right knee 09/13/2016  ? Pressure injury of skin 03/08/2016  ? Acute gout of left knee 03/07/2016  ? Sinus congestion 11/29/2015  ? Bilateral leg edema 09/25/2015  ? Abdominal pain, left upper quadrant 09/25/2015  ? DJD (degenerative joint disease) of upper arm  09/07/2015  ? Prednisone adverse reaction 08/20/2015  ? Abdominal pain, chronic, epigastric 06/30/2015  ? Osteoarthritis of right foot 03/22/2015  ? Type 2 diabetes mellitus with diabetic polyneuropathy, without long-term current use of insulin ([place]) 03/20/2015  ? BMI 40.0-44.9, adult ([place]) 06/13/2013  ? Overactive bladder 06/13/2013  ? Allergic rhinitis 06/13/2013  ? Asthma, moderate persistent 06/13/2013  ? BIPOLAR DISORDER UNSPECIFIED 03/18/2008  ? PERIPHERAL EDEMA 07/03/2007  ? GERD  01/30/2007  ? HYPERCHOLESTEROLEMIA 12/07/2006  ? Essential hypertension 12/07/2006  ? Osteoarthritis of right knee 12/07/2006  ? ANEMIA, IRON DEFICIENCY 01/06/2006  ? ? ?History obtained from: chart review and patient. ? ?Shakti is a 61 y.o. female presenting for a follow up visit.  She was last seen in February 2023.  At that time, we did not do lung testing.  We continued with Breztri 2 puffs twice daily and Fasenra.  For her allergic rhinitis, we continue with Xyzal as well as Astelin.  We did talk about allergen immunotherapy and she did eventually decide to start that.  She continues to avoid seafood.  For her ear itching, we started Ciprodex eardrops twice daily for 1 week. ? ?Since last visit, she has had a rather interesting course.  She called after being exposed to a rabbit which apparently was in the office of her apartment complex.  This caused her to have an acute asthma attack.  She request that we send allergy testing parameters and does have an upcoming wedding positive. ? ?Asthma/Respiratory Symptom History: She remains on Breztri 2 puffs twice daily.  She is up-to-date on her [name].  She thinks that this is working okay, although she is rather vague with her reporting of symptoms. She has not been using her rescue inhaler.  She has not been to the hospital nor she been on systemic steroids. ? ?Something about communication with the "lab" why which she means shot room. Unsure  ? ?Allergic Rhinitis Symptom History: His allergy shots are going well.  She spends a lot of time talking about the staff are not professional. She is on her nasal steroid as well as her Xyzal every day.  She is not sure if the shots are doing much, but she is only on the [facility]. ? ?Food Allergy Symptom History: She continues to avoid seafood.  She has had no accidental ingestions.  Epinephrine autoinjector is up-to-date. ? ?She is moving back to [facility].  I did tell her we can help her transfer to a different  allergist. ? ?Otherwise, there have been no changes to her past medical history, surgical history, family history, or social history. ? ? ? ?Review of Systems  ?Constitutional: Negative.  Negative for chills, fever, malaise/fatigue and weight loss.  ?HENT: Negative.  Negative for congestion, ear discharge, ear pain and sinus pain.   ?Eyes:  Negative for pain, discharge and redness.  ?Respiratory:  Negative for cough, sputum production, shortness of breath and wheezing.   ?Cardiovascular: Negative.  Negative for chest pain and palpitations.  ?Gastrointestinal:  Negative for abdominal pain, constipation, diarrhea, heartburn, nausea and vomiting.  ?Skin: Negative.  Negative for itching and rash.  ?Neurological:  Negative for dizziness and headaches.  ?Endo/Heme/Allergies:  Negative for environmental allergies. Does not bruise/bleed easily.   ? ? ? ?Objective:  ? ?Blood pressure 134/70, pulse 88, temperature 97.7 ?F (36.5 ?C), temperature source Temporal, resp. rate 16, weight 202 lb 12.8 oz (92 kg), last menstrual period 05/30/2013. ?Body mass index is 37.09 kg/m?. ? ? ? ?  Physical Exam ?Vitals reviewed.  ?Constitutional:   ?   Appearance: She is well-developed.  ?   Comments: Very talkative despite the fact that she told the staff that she needed to get to another appointment.  ?HENT:  ?   Head: Normocephalic and atraumatic.  ?   Right Ear: Tympanic membrane, ear canal and external ear normal.  ?   Left Ear: Tympanic membrane, ear canal and external ear normal.  ?   Nose: No nasal deformity, septal deviation, mucosal edema or rhinorrhea.  ?   Right Turbinates: Enlarged, swollen and pale.  ?   Left Turbinates: Enlarged, swollen and pale.  ?   Right Sinus: No maxillary sinus tenderness or frontal sinus tenderness.  ?   Left Sinus: No maxillary sinus tenderness or frontal sinus tenderness.  ?   Mouth/Throat:  ?   Mouth: Mucous membranes are not pale and not dry.  ?   Pharynx: Uvula midline.  ?Eyes:  ?   General: Lids are  normal. No allergic shiner.    ?   Right eye: No discharge.     ?   Left eye: No discharge.  ?   Conjunctiva/sclera: Conjunctivae normal.  ?   Right eye: Right conjunctiva is not injected. No chemosis. ?   Left e

## 2021-07-13 NOTE — Patient Instructions (Addendum)
1. Moderate persistent asthma, uncomplicated ?- We did not do lung testing today. ?- We will check at the next visit.  ?- We are not going to make any medication changes.  ?- Daily controller medication(s): Breztri two puffs two puffs twice daily with spacer + Fasenra every 8 weeks thereafter ?- We will work on getting you to get the [place] at home (since you give yourself diabetic drugs).  ?- Prior to physical activity: albuterol 2 puffs 10-15 minutes before physical activity. ?- Rescue medications: albuterol 4 puffs every 4-6 hours as needed ?- Asthma control goals:  ?* Full participation in all desired activities (may need albuterol before activity) ?* Albuterol use two time or less a week on average (not counting use with activity) ?* Cough interfering with sleep two time or less a month ?* Oral steroids no more than once a year ?* No hospitalizations ? ?2. Seasonal and perennial allergic rhinitis (grasses, ragweed, weeds, trees, indoor molds, outdoor molds, dust mites, cat, dog and cockroach) ?- Continue with: Xyzal (levocetirizine) 5mg  tablet once daily and Astelin (azelastine) 2 sprays per nostril 1-2 times daily as needed ?- Continue with allergy shots at the same schedule.  ? ?3. Anaphylactic shock due to food (catfish, bass, trout, flounder, and cod) ?- Continue to avoid triggering foods. ?- is up to date.  ? ?4. Eczema  ?- You should not use the triamcinolone cream on your face at all.  ?- We can use Elidel twice daily as needed instead.  ? ?5. Return in about 6 months (around 01/13/2022).  ? ? ?Please inform 13/11/2021 of any Emergency Department visits, hospitalizations, or changes in symptoms. Call us before going to the ED for breathing or allergy symptoms since we might be able to fit you in for a sick visit. Feel free to contact us anytime with any questions, problems, or concerns. ? ?It was a pleasure to see you again today! ? ?Websites that have reliable patient information: ?1. American Academy of  Asthma, Allergy, and Immunology: www.aaaai.org ?2. Food Allergy Research and Education (FARE): foodallergy.org ?3. Mothers of Asthmatics: http://www.asthmacommunitynetwork.org ?4. [place] of Allergy, Asthma, and Immunology: [employer] ? ? ?COVID-19 Vaccine Information can be found at: MissingWeapons.ca For questions related to vaccine distribution or appointments, please email vaccine@[email or web address] .[email or web address] or call [phone].  ? ?We realize that you might be concerned about having an allergic reaction to the COVID19 vaccines. To help with that concern, WE ARE OFFERING THE COVID19 VACCINES IN OUR OFFICE! Ask the front desk for dates!  ? ? ? ??Like? [phone] on Facebook and Instagram for our latest updates!  ?  ? ? ?A healthy democracy works best when [place] participate! Make sure you are registered to vote! If you have moved or changed any of your contact information, you will need to get this updated before voting! ? ?In some cases, you MAY be able to register to vote online: [employer] ? ? ? ? ? ? ?

## 2021-07-14 ENCOUNTER — Ambulatory Visit: Payer: Medicare Other | Admitting: Orthopaedic Surgery

## 2021-07-14 ENCOUNTER — Ambulatory Visit: Payer: Self-pay

## 2021-07-14 ENCOUNTER — Telehealth: Payer: Self-pay | Admitting: Allergy & Immunology

## 2021-07-14 DIAGNOSIS — Z23 Encounter for immunization: Secondary | ICD-10-CM

## 2021-07-14 NOTE — Telephone Encounter (Signed)
We could add this to her vial.  She is only in blue, but we would have to start over completely.  Let me ask Marcie Bal if we would be able to make a whole new set of vials for her before making a decision on this. ? ?Salvatore Marvel, MD ?[facility] [place] [facility] ? ?

## 2021-07-14 NOTE — Telephone Encounter (Signed)
Melissa Chavez came in the office and wanted to know if she can get rabbit added to her allergy injections?  Please advise? ?

## 2021-07-15 ENCOUNTER — Ambulatory Visit (INDEPENDENT_AMBULATORY_CARE_PROVIDER_SITE_OTHER): Payer: Medicare Other

## 2021-07-15 ENCOUNTER — Encounter: Payer: Self-pay | Admitting: Allergy & Immunology

## 2021-07-15 DIAGNOSIS — J309 Allergic rhinitis, unspecified: Secondary | ICD-10-CM

## 2021-07-16 ENCOUNTER — Telehealth: Payer: Self-pay

## 2021-07-16 ENCOUNTER — Telehealth: Payer: Self-pay | Admitting: *Deleted

## 2021-07-16 NOTE — Telephone Encounter (Signed)
PA is pending for pimecrolimus cream (Elidel).  ?

## 2021-07-16 NOTE — Telephone Encounter (Signed)
Patient came in 07/14/21 to get allergy shot, she asked about rabbit being put in her allergy vial because she had an appointment with Dr. Dellis Anes the day before on 07/13/2021 and there wasn't anything about rabbit in her AVS so she was upset and stated she didn't want to get any injections without the rabbit being in the vials, I said ok and put the injection back in the vials and she left. After about 10 minutes she came back and stated she would like to get the injections now and I told her she would have to sign back in because I had a line and would have get her vials and redraw the injections up and she was upset and stated never mind and walked out again. She came back the next day 07/15/21 and received her allergy injections. ?

## 2021-07-16 NOTE — Telephone Encounter (Signed)
I think we should just hold off. Let's tell her we do not stock rabbit allergen extra for allergy shots. Marylu Lund - can you give her a call?  ? ?Malachi Bonds, MD ?[facility] ? ?

## 2021-07-20 ENCOUNTER — Ambulatory Visit (INDEPENDENT_AMBULATORY_CARE_PROVIDER_SITE_OTHER): Payer: Medicare Other

## 2021-07-20 DIAGNOSIS — J309 Allergic rhinitis, unspecified: Secondary | ICD-10-CM | POA: Diagnosis not present

## 2021-07-20 NOTE — Telephone Encounter (Signed)
Approved and faxed to pharmacy

## 2021-07-28 ENCOUNTER — Encounter: Payer: Self-pay | Admitting: Podiatry

## 2021-07-28 ENCOUNTER — Ambulatory Visit: Payer: Medicare Other | Admitting: Podiatry

## 2021-07-28 DIAGNOSIS — B351 Tinea unguium: Secondary | ICD-10-CM

## 2021-07-28 DIAGNOSIS — E1142 Type 2 diabetes mellitus with diabetic polyneuropathy: Secondary | ICD-10-CM | POA: Diagnosis not present

## 2021-07-28 DIAGNOSIS — M79675 Pain in left toe(s): Secondary | ICD-10-CM | POA: Diagnosis not present

## 2021-07-28 DIAGNOSIS — L84 Corns and callosities: Secondary | ICD-10-CM

## 2021-07-28 DIAGNOSIS — M79674 Pain in right toe(s): Secondary | ICD-10-CM | POA: Diagnosis not present

## 2021-07-29 ENCOUNTER — Ambulatory Visit (INDEPENDENT_AMBULATORY_CARE_PROVIDER_SITE_OTHER): Payer: Medicare Other

## 2021-07-29 DIAGNOSIS — J309 Allergic rhinitis, unspecified: Secondary | ICD-10-CM | POA: Diagnosis not present

## 2021-07-30 DIAGNOSIS — Z961 Presence of intraocular lens: Secondary | ICD-10-CM | POA: Diagnosis not present

## 2021-07-30 DIAGNOSIS — H18413 Arcus senilis, bilateral: Secondary | ICD-10-CM | POA: Diagnosis not present

## 2021-07-30 DIAGNOSIS — H26492 Other secondary cataract, left eye: Secondary | ICD-10-CM | POA: Diagnosis not present

## 2021-07-30 DIAGNOSIS — H26493 Other secondary cataract, bilateral: Secondary | ICD-10-CM | POA: Diagnosis not present

## 2021-07-30 DIAGNOSIS — H53023 Refractive amblyopia, bilateral: Secondary | ICD-10-CM | POA: Diagnosis not present

## 2021-08-02 NOTE — Progress Notes (Signed)
  Subjective:  Patient ID: Melissa Chavez, female    DOB: [birth year],  MRN: [id number]  Melissa Chavez presents to clinic today for at risk foot care with history of diabetic neuropathy and callus(es) b/l lower extremities and painful thick toenails that are difficult to trim. Painful toenails interfere with ambulation. Aggravating factors include wearing enclosed shoe gear. Pain is relieved with periodic professional debridement. Painful calluses are aggravated when weightbearing with and without shoegear. Pain is relieved with periodic professional debridement.  Since her last visit, she had an episode where a rabbit ran across her foot in early April. This occurred in her apartment's leasing office. Patient saw Allergist and testing was done to confirm allergy to rabbits. ER workup was negative for fracture. She was treated with APAP.  PCP is Merrilee Seashore, MD , and last visit was June 15, 2021.  Allergies  Allergen Reactions   Fish Allergy Anaphylaxis   Other Anaphylaxis and Rash    Cockroaches, rag weed, pollen,- showed up in allergy test unknown reaction Cockroaches, rag weed, pollen,- showed up in allergy test unknown reaction Cockroaches, rag weed, pollen,- showed up in allergy test unknown reaction   Banana Other (See Comments)    unknown   Empagliflozin Other (See Comments)    Other reaction(s): Unknown   Metformin Other (See Comments)    Other reaction(s): Unknown   Prednisone Other (See Comments)    Anxiety, erratic behavior, ?hallucinations Requiring ED and psychiatric evaluation    Review of Systems: Negative except as noted in the HPI.  Objective:  Constitutional Melissa Chavez is a pleasant 61 y.o. African American female, obese in NAD. AAO x 3.   Vascular Capillary refill time to digits immediate b/l. Palpable pedal pulses b/l LE. Pedal hair present. No pain with calf compression b/l. Lower extremity skin temperature gradient within normal limits. No edema  noted b/l LE. No ischemia or gangrene noted b/l LE. No cyanosis or clubbing noted b/l LE.  Neurologic Normal speech. Oriented to person, place, and time. Pt has subjective symptoms of neuropathy. Protective sensation intact 5/5 intact bilaterally with 10g monofilament b/l. Vibratory sensation intact b/l.  Dermatologic Pedal integument with normal turgor, texture and tone BLE. No open wounds b/l LE. No interdigital macerations noted b/l LE. Toenails 1-5 b/l elongated, discolored, dystrophic, thickened, crumbly with subungual debris and tenderness to dorsal palpation. Hyperkeratotic lesion(s) R hallux, submet head 1 right foot, and submet head 5 b/l.  No erythema, no edema, no drainage, no fluctuance.  Orthopedic: Muscle strength 5/5 to all lower extremity muscle groups bilaterally. HAV with bunion deformity noted b/l LE. Tailor's bunion deformity b/l. Hammertoe deformity noted 2-5 b/l.   Radiographs: None  Assessment/Plan: 1. Pain due to onychomycosis of toenails of both feet   2. Callus   3. Type 2 diabetes mellitus with diabetic polyneuropathy, without long-term current use of insulin ([place])     -Patient was evaluated and treated. All patient's and/or POA's questions/concerns answered on today's visit. -Episode of rabbit running over her left foot. Seen by ED late April. No acute findings today. -Toenails 1-5 b/l were debrided in length and girth with sterile nail nippers and dremel without iatrogenic bleeding.  -Callus(es) right great toe, submet head 1 right foot, and submet head 5 b/l pared utilizing sterile scalpel blade without complication or incident. Total number debrided =4. -Patient/POA to call should there be question/concern in the interim.   Return in about 3 months (around 10/28/2021).  Marzetta Board, DPM

## 2021-08-05 ENCOUNTER — Ambulatory Visit (INDEPENDENT_AMBULATORY_CARE_PROVIDER_SITE_OTHER): Payer: Medicare Other

## 2021-08-05 DIAGNOSIS — J309 Allergic rhinitis, unspecified: Secondary | ICD-10-CM

## 2021-08-09 ENCOUNTER — Ambulatory Visit (INDEPENDENT_AMBULATORY_CARE_PROVIDER_SITE_OTHER): Payer: Medicare Other

## 2021-08-09 ENCOUNTER — Telehealth: Payer: Self-pay | Admitting: Orthopaedic Surgery

## 2021-08-09 DIAGNOSIS — J4540 Moderate persistent asthma, uncomplicated: Secondary | ICD-10-CM

## 2021-08-09 NOTE — Telephone Encounter (Signed)
Patient called. She would like gel injections done in her knee. Her call back number is [phone]

## 2021-08-13 ENCOUNTER — Telehealth: Payer: Self-pay

## 2021-08-13 NOTE — Telephone Encounter (Signed)
VOB has been submitted for Durolane, bilateral knee. BV pending.  Patient called this morning concerning gel injection.  Tried to explain to the patient how the process works, but she kept talking over me and went back to how she got the injection in December, 2022.  I then advised her that it has to be 6 months before she can get the next gel injection.  Patient then stated that an appointment was suppose to been made for June, 2023, but no appointment was scheduled or has been scheduled.  I did advise patient that we could go ahead and make the appointment for the gel injection, but would barely let me get a word in. Asked for administrator of the office to give her a call.

## 2021-08-13 NOTE — Telephone Encounter (Signed)
Noted  

## 2021-08-17 ENCOUNTER — Telehealth: Payer: Self-pay

## 2021-08-17 ENCOUNTER — Other Ambulatory Visit: Payer: Self-pay

## 2021-08-17 ENCOUNTER — Ambulatory Visit (INDEPENDENT_AMBULATORY_CARE_PROVIDER_SITE_OTHER): Payer: Medicare Other

## 2021-08-17 DIAGNOSIS — H26491 Other secondary cataract, right eye: Secondary | ICD-10-CM | POA: Diagnosis not present

## 2021-08-17 DIAGNOSIS — M1712 Unilateral primary osteoarthritis, left knee: Secondary | ICD-10-CM

## 2021-08-17 DIAGNOSIS — M1711 Unilateral primary osteoarthritis, right knee: Secondary | ICD-10-CM

## 2021-08-17 DIAGNOSIS — J309 Allergic rhinitis, unspecified: Secondary | ICD-10-CM

## 2021-08-17 NOTE — Telephone Encounter (Signed)
Check referrals tab for gel information

## 2021-08-18 DIAGNOSIS — N1831 Chronic kidney disease, stage 3a: Secondary | ICD-10-CM | POA: Diagnosis not present

## 2021-08-18 DIAGNOSIS — E1122 Type 2 diabetes mellitus with diabetic chronic kidney disease: Secondary | ICD-10-CM | POA: Diagnosis not present

## 2021-08-18 DIAGNOSIS — Z794 Long term (current) use of insulin: Secondary | ICD-10-CM | POA: Diagnosis not present

## 2021-08-18 DIAGNOSIS — I10 Essential (primary) hypertension: Secondary | ICD-10-CM | POA: Diagnosis not present

## 2021-08-25 DIAGNOSIS — N1831 Chronic kidney disease, stage 3a: Secondary | ICD-10-CM | POA: Diagnosis not present

## 2021-08-25 DIAGNOSIS — Z794 Long term (current) use of insulin: Secondary | ICD-10-CM | POA: Diagnosis not present

## 2021-08-25 DIAGNOSIS — E782 Mixed hyperlipidemia: Secondary | ICD-10-CM | POA: Diagnosis not present

## 2021-08-25 DIAGNOSIS — I10 Essential (primary) hypertension: Secondary | ICD-10-CM | POA: Diagnosis not present

## 2021-08-25 DIAGNOSIS — E1122 Type 2 diabetes mellitus with diabetic chronic kidney disease: Secondary | ICD-10-CM | POA: Diagnosis not present

## 2021-08-26 ENCOUNTER — Ambulatory Visit (INDEPENDENT_AMBULATORY_CARE_PROVIDER_SITE_OTHER): Payer: Medicare Other

## 2021-08-26 DIAGNOSIS — J309 Allergic rhinitis, unspecified: Secondary | ICD-10-CM | POA: Diagnosis not present

## 2021-08-28 ENCOUNTER — Other Ambulatory Visit: Payer: Self-pay | Admitting: Allergy & Immunology

## 2021-09-01 ENCOUNTER — Ambulatory Visit (INDEPENDENT_AMBULATORY_CARE_PROVIDER_SITE_OTHER): Payer: Medicare Other | Admitting: Orthopaedic Surgery

## 2021-09-01 ENCOUNTER — Encounter: Payer: Self-pay | Admitting: Orthopaedic Surgery

## 2021-09-01 DIAGNOSIS — M1711 Unilateral primary osteoarthritis, right knee: Secondary | ICD-10-CM | POA: Diagnosis not present

## 2021-09-01 DIAGNOSIS — M1712 Unilateral primary osteoarthritis, left knee: Secondary | ICD-10-CM | POA: Diagnosis not present

## 2021-09-01 DIAGNOSIS — M170 Bilateral primary osteoarthritis of knee: Secondary | ICD-10-CM | POA: Diagnosis not present

## 2021-09-01 MED ORDER — SODIUM HYALURONATE 60 MG/3ML IX PRSY
60.0000 mg | PREFILLED_SYRINGE | INTRA_ARTICULAR | Status: AC | PRN
  Administered 2021-09-01: 60 mg via INTRA_ARTICULAR

## 2021-09-01 NOTE — Progress Notes (Signed)
   Procedure Note  Patient: Melissa Chavez             Date of Birth: 02/06/1961           MRN: [id number]             Visit Date: 09/01/2021  Procedures: Visit Diagnoses:  1. Unilateral primary osteoarthritis, right knee   2. Unilateral primary osteoarthritis, left knee     Large Joint Inj: bilateral knee on 09/01/2021 3:58 PM Indications: diagnostic evaluation and pain Details: 22 G 1.5 in needle, superolateral approach  Arthrogram: No  Medications (Right): 60 mg Sodium Hyaluronate 60 MG/3ML Medications (Left): 60 mg Sodium Hyaluronate 60 MG/3ML Outcome: tolerated well, no immediate complications Procedure, treatment alternatives, risks and benefits explained, specific risks discussed. Consent was given by the patient. Immediately prior to procedure a time out was called to verify the correct patient, procedure, equipment, support staff and site/side marked as required. Patient was prepped and draped in the usual sterile fashion.    The patient is a 61 year old female with known osteoarthritis of both her knees.  She has tried and failed other forms of conservative treatment.  She is here today for hyaluronic acid in both knees with Durolane.  She has had these injections before.  She has had no acute change in medical status.  I did place the Durolane in both knees today without difficulty.  She knows to wait at least a minimum of 6 months between injections.  All question concerns were answered and addressed.

## 2021-09-08 ENCOUNTER — Ambulatory Visit: Payer: Medicare Other

## 2021-09-10 ENCOUNTER — Ambulatory Visit (INDEPENDENT_AMBULATORY_CARE_PROVIDER_SITE_OTHER): Payer: Medicare Other | Admitting: *Deleted

## 2021-09-10 ENCOUNTER — Telehealth: Payer: Self-pay | Admitting: Orthopaedic Surgery

## 2021-09-10 DIAGNOSIS — J309 Allergic rhinitis, unspecified: Secondary | ICD-10-CM | POA: Diagnosis not present

## 2021-09-10 NOTE — Telephone Encounter (Signed)
I called pt. Advised her to alternate between tylenol arthritis and nsaid, to elevate and ice and to move knee back and forth. She stated she would try this.

## 2021-09-10 NOTE — Telephone Encounter (Signed)
Pt called and states that she is still in a lot of pain since the injection. Wondering if she can take a muscle relaxer that her primary prescribed her?  [id number] [phone] [id number]

## 2021-09-14 ENCOUNTER — Ambulatory Visit (INDEPENDENT_AMBULATORY_CARE_PROVIDER_SITE_OTHER): Payer: Medicare Other

## 2021-09-14 DIAGNOSIS — J4550 Severe persistent asthma, uncomplicated: Secondary | ICD-10-CM

## 2021-09-20 DIAGNOSIS — E785 Hyperlipidemia, unspecified: Secondary | ICD-10-CM | POA: Diagnosis not present

## 2021-09-20 DIAGNOSIS — Z794 Long term (current) use of insulin: Secondary | ICD-10-CM | POA: Diagnosis not present

## 2021-09-20 DIAGNOSIS — E781 Pure hyperglyceridemia: Secondary | ICD-10-CM | POA: Diagnosis not present

## 2021-09-20 DIAGNOSIS — E782 Mixed hyperlipidemia: Secondary | ICD-10-CM | POA: Diagnosis not present

## 2021-09-20 DIAGNOSIS — E1169 Type 2 diabetes mellitus with other specified complication: Secondary | ICD-10-CM | POA: Diagnosis not present

## 2021-09-20 DIAGNOSIS — E1122 Type 2 diabetes mellitus with diabetic chronic kidney disease: Secondary | ICD-10-CM | POA: Diagnosis not present

## 2021-09-20 DIAGNOSIS — I10 Essential (primary) hypertension: Secondary | ICD-10-CM | POA: Diagnosis not present

## 2021-09-21 ENCOUNTER — Other Ambulatory Visit: Payer: Self-pay | Admitting: Internal Medicine

## 2021-09-21 DIAGNOSIS — Z1231 Encounter for screening mammogram for malignant neoplasm of breast: Secondary | ICD-10-CM

## 2021-09-22 ENCOUNTER — Ambulatory Visit (INDEPENDENT_AMBULATORY_CARE_PROVIDER_SITE_OTHER): Payer: Medicare Other | Admitting: *Deleted

## 2021-09-22 DIAGNOSIS — J309 Allergic rhinitis, unspecified: Secondary | ICD-10-CM

## 2021-10-01 ENCOUNTER — Ambulatory Visit (INDEPENDENT_AMBULATORY_CARE_PROVIDER_SITE_OTHER): Payer: Medicare Other | Admitting: Allergy

## 2021-10-01 ENCOUNTER — Encounter: Payer: Self-pay | Admitting: Allergy

## 2021-10-01 VITALS — BP 124/74 | HR 89 | Temp 97.9°F

## 2021-10-01 DIAGNOSIS — J309 Allergic rhinitis, unspecified: Secondary | ICD-10-CM

## 2021-10-01 DIAGNOSIS — H1013 Acute atopic conjunctivitis, bilateral: Secondary | ICD-10-CM

## 2021-10-01 DIAGNOSIS — J3089 Other allergic rhinitis: Secondary | ICD-10-CM

## 2021-10-01 MED ORDER — AZELASTINE HCL 0.1 % NA SOLN: 2.0000 | Freq: Two times a day (BID) | NASAL | 5 refills | Status: DC

## 2021-10-01 NOTE — Progress Notes (Signed)
Follow-up Note  RE: Melissa Chavez MRN: [id number] DOB: 1960/05/28 Date of Office Visit: 10/01/2021   History of present illness: Melissa Chavez is a 61 y.o. female presenting today for increased allergic rhinoconjunctivitis symptoms.  She was last seen in the office on 07/15/2021 by Dr. Ernst Bowler her primary allergist.  She states yesterday her yardman came to do her yard and she states that exposure caused her to have more sinus pressure around eyes and ears, nasal congestion as well as itchy eyes.   She can not take her allergy medication in the day time though she took it last night.   The newest eye drop she got at her last visit, Bepreve, she is having issues with.  She states she was told it can dry her eyes out.  So she was advised to get artifical tears but she states she cries a lot anyway thus did not feel the need for more tears.  She states with the bepreve when she first put it in it is soothing but hours later it can feel gritty.   Review of systems: Review of Systems  Constitutional: Negative.   HENT:         See HPI  Eyes:        See HPI  Respiratory: Negative.    Cardiovascular: Negative.   Gastrointestinal: Negative.   Musculoskeletal: Negative.   Skin: Negative.   Allergic/Immunologic: Negative.   Neurological: Negative.      All other systems negative unless noted above in HPI  Past medical/social/surgical/family history have been reviewed and are unchanged unless specifically indicated below.  No changes  Medication List: Current Outpatient Medications  Medication Sig Dispense Refill   ACCU-CHEK SOFTCLIX LANCETS lancets TEST BLOOD SUGAR ONCE DAILY 100 each 0   albuterol (PROVENTIL) (2.5 MG/3ML) 0.083% nebulizer solution TAKE 3MLS BY NEBULIZATION EVERY 6 HOURS AS NEEDED 75 mL 1   azelastine (ASTELIN) 0.1 % nasal spray Place 2 sprays into both nostrils 2 (two) times daily. (Patient taking differently: Place 2 sprays into both nostrils 2 (two) times  daily as needed.) 30 mL 5   Benralizumab (FASENRA PEN) 30 MG/ML SOAJ Inject 1 mL (30 mg total) into the skin every 28 (twenty-eight) days. For 3 doses then every 8 weeks 0.28 mL 8   Bepotastine Besilate 1.5 % SOLN Place 1 drop into both eyes daily as needed. 10 mL 5   Blood Glucose Calibration (ACCU-CHEK AVIVA) SOLN Test blood sugar once daily. Dx: E11.42 1 each 3   blood glucose meter kit and supplies KIT Use as directed 4 times a day.  DX: R73.09 1 each 0   Blood Glucose Monitoring Suppl (ACCU-CHEK AVIVA PLUS) w/Device KIT TEST BLOOD SUGAR ONCE  DAILY. 1 kit 0   BREZTRI AEROSPHERE 160-9-4.8 MCG/ACT AERO Inhale 2 puffs into the lungs in the morning and at bedtime. 32.1 g 3   dapagliflozin propanediol (FARXIGA) 5 MG TABS tablet 1 tablet     enalapril (VASOTEC) 20 MG tablet Take 20 mg by mouth daily.     EPINEPHrine 0.3 mg/0.3 mL IJ SOAJ injection ADMINISTER 0.3 MG IN THE MUSCLE 1 TIME FOR 1 DOSE     febuxostat (ULORIC) 40 MG tablet Take 40 mg by mouth daily.     glucose blood (ACCU-CHEK AVIVA PLUS) test strip Test blood sugar once daily. Dx: E11.42 100 each 3   hydrOXYzine (ATARAX/VISTARIL) 25 MG tablet 1 tablet as needed     insulin degludec (TRESIBA FLEXTOUCH) 100 UNIT/ML  FlexTouch Pen 20 units     Insulin Pen Needle (B-D UF III MINI PEN NEEDLES) 31G X 5 MM MISC See admin instructions.     levocetirizine (XYZAL) 5 MG tablet Take 1 tablet (5 mg total) by mouth daily. 90 tablet 1   methocarbamol (ROBAXIN) 750 MG tablet Take 750 mg by mouth as needed for muscle spasms.     montelukast (SINGULAIR) 10 MG tablet 1 tablet     Multiple Vitamins-Minerals (CENTRUM SILVER 50+WOMEN PO) Take 1 tablet by mouth daily.     omeprazole (PRILOSEC) 40 MG capsule 1 capsule 30 minutes before morning meal     oxybutynin (DITROPAN XL) 15 MG 24 hr tablet TAKE 1 TABLET BY MOUTH AT  BEDTIME 90 tablet 0   PARoxetine (PAXIL) 10 MG tablet Take 10 mg by mouth daily.     pimecrolimus (ELIDEL) 1 % cream Apply topically 2  (two) times daily. 100 g 2   rosuvastatin (CRESTOR) 10 MG tablet Take 10 mg by mouth daily.     Semaglutide,0.25 or 0.5MG/DOS, (OZEMPIC, 0.25 OR 0.5 MG/DOSE,) 2 MG/1.5ML SOPN See admin instructions.     triamcinolone ointment (KENALOG) 0.1 % Apply 3-4 times daily for 2 days after each injection to affected areas. 453.6 g 1   triamterene-hydrochlorothiazide (MAXZIDE-25) 37.5-25 MG tablet Take 1 tablet by mouth daily.      VENTOLIN HFA 108 (90 Base) MCG/ACT inhaler USE 2 INHALATIONS BY MOUTH  EVERY 6 HOURS AS NEEDED FOR WHEEZING 54 g 1   tiZANidine (ZANAFLEX) 2 MG tablet Take 2 mg by mouth 3 (three) times daily.     Current Facility-Administered Medications  Medication Dose Route Frequency Provider Last Rate Last Admin   Benralizumab SOSY 30 mg  30 mg Subcutaneous Q28 days Valentina Shaggy, MD   30 mg at 09/14/21 0854     Known medication allergies: Allergies  Allergen Reactions   Fish Allergy Anaphylaxis   Other Anaphylaxis and Rash    Cockroaches, rag weed, pollen,- showed up in allergy test unknown reaction Cockroaches, rag weed, pollen,- showed up in allergy test unknown reaction Cockroaches, rag weed, pollen,- showed up in allergy test unknown reaction   Banana Other (See Comments)    unknown   Empagliflozin Other (See Comments)    Other reaction(s): Unknown   Metformin Other (See Comments)    Other reaction(s): Unknown   Prednisone Other (See Comments)    Anxiety, erratic behavior, ?hallucinations Requiring ED and psychiatric evaluation     Physical examination: Blood pressure 124/74, pulse 89, temperature 97.9 F (36.6 C), last menstrual period 05/30/2013, SpO2 99 %.  General: Alert, interactive, in no acute distress. HEENT: PERRLA, TMs pearly gray, turbinates minimally edematous without discharge, post-pharynx non erythematous. Neck: Supple without lymphadenopathy. Lungs: Clear to auscultation without wheezing, rhonchi or rales. {no increased work of  breathing. CV: Normal S1, S2 without murmurs. Abdomen: Nondistended, nontender. Skin: Warm and dry, without lesions or rashes. Extremities:  No clubbing, cyanosis or edema. Neuro:   Grossly intact.  Diagnositics/Labs: Allergen immunotherapy injections provided today in each arm  Assessment and plan:   Seasonal and perennial allergic rhinitis with conjunctivitis  -Continue avoidance measures for grasses, ragweed, weeds, trees, indoor molds, outdoor molds, dust mites, cat, dog and cockroach - Continue with: Xyzal (levocetirizine) 4m tablet once daily Astelin (azelastine) 2 sprays per nostril 1-2 times daily as needed Bepreve 1 drop each eye twice a day as needed for itchy, watery or red eyes. Advised can use a lubricating eyedrop  during the day when using Bepreve  - Continue with allergy shots at the same schedule.  ---------------------------------------------------------------------------------  Moderate persistent asthma - Daily controller medication(s): Breztri two puffs two puffs twice daily with spacer + Fasenra every 8 weeks thereafter - Prior to physical activity: albuterol 2 puffs 10-15 minutes before physical activity. 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 - Wynona Luna is up to date.   Eczema  - You should not use the triamcinolone cream on your face at all.  - We can use Elidel twice daily as needed instead.   Return in about 6 months with Dr. Ernst Bowler  I appreciate the opportunity to take part in [place] care. Please do not  hesitate to contact me with questions.  Sincerely,   Prudy Feeler, MD Allergy/Immunology [facility]

## 2021-10-01 NOTE — Patient Instructions (Addendum)
Seasonal and perennial allergic rhinitis with conjunctivitis  -Continue avoidance measures for grasses, ragweed, weeds, trees, indoor molds, outdoor molds, dust mites, cat, dog and cockroach - Continue with: Xyzal (levocetirizine) 5mg  tablet once daily Astelin (azelastine) 2 sprays per nostril 1-2 times daily as needed Bepreve 1 drop each eye twice a day as needed for itchy, watery or red eyes. Advised can use a lubricating eyedrop during the day when using Bepreve  - Continue with allergy shots at the same schedule.  ---------------------------------------------------------------------------------  Moderate persistent asthma - Daily controller medication(s): Breztri two puffs two puffs twice daily with spacer + Fasenra every 8 weeks thereafter - Prior to physical activity: albuterol 2 puffs 10-15 minutes before physical activity. 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 - is up to date.   Eczema  - You should not use the triamcinolone cream on your face at all.  - We can use Elidel twice daily as needed instead.   Return in about 6 months with Dr. Audry Riles

## 2021-10-13 ENCOUNTER — Ambulatory Visit: Payer: Medicare Other

## 2021-10-14 ENCOUNTER — Telehealth: Payer: Self-pay

## 2021-10-14 ENCOUNTER — Telehealth: Payer: Self-pay | Admitting: Allergy

## 2021-10-14 NOTE — Telephone Encounter (Addendum)
Patient called wanting to speak with [name]. She states she has not heard anything about her [name] and is afraid she will not get an injection this month. She is demanding a call back ASAP. I did inform patient I will get a message to [name] but she was not satisfied with that answer.

## 2021-10-14 NOTE — Telephone Encounter (Signed)
Spoke with patient, informed her of the mistake on our end with scheduling her for 4 weeks instead of 8 week. I informed her of how Fasenra works with the first 3 injections at every 4 weeks and then every 8 weeks there after. I informed her the reason her medication is not in office is because she isn't due for the injection yet even though she was scheduled for 4 weeks. I apologized to her for this mistake, she was worried that she would have to pay for canceling her appointment. I informed patient that she would not be charged for that appointment as she did not receive an injection and was not due for it. Patient verbalized understanding and was appreciative for a call back. I did ask her if she would still like a call back from [name], she informed me that since she spoke with me and I informed her of th above information that she did not need [name] to call her back.  

## 2021-10-14 NOTE — Telephone Encounter (Signed)
Spoke with patient, informed her of the mistake on our end with scheduling her for 4 weeks instead of 8 week. I informed her of how [name] works with the first 3 injections at every 4 weeks and then every 8 weeks there after. I informed her the reason her medication is not in office is because she isn't due for the injection yet even though she was scheduled for 4 weeks. I apologized to her for this mistake, she was worried that she would have to pay for canceling her appointment. I informed patient that she would not be charged for that appointment as she did not receive an injection and was not due for it. Patient verbalized understanding and was appreciative for a call back. I did ask her if she would still like a call back from [name], she informed me that since she spoke with me and I informed her of th above information that she did not need [name] to call her back.

## 2021-10-14 NOTE — Telephone Encounter (Signed)
Patient called in  -DOB verified - regarding the status of her Fasenra medication delivery. Patient stated she was suppose to receive her injection on yesterday, Wednesday,10/13/21 but didn't due to the medication had not been delivered. Patient stated she was advised by the assistant working the injection room yesterday, Wednesday, 10/13/21 - a message would be forwarded to our Biologic Coordinator to research why medication has not been delivered.   Reviewed with Site Manager - patient received Fasenra injection on 09/14/21 - her next injection is 11/16/21 @ 9:30 am.Patient receives Fasenra injection every 8 weeks.  Patient asked for the Biologic Coordinator's contact information again to contact her regarding her Fasenra delivery - gave patient the contact information - advised I would be forwarding message to [name] as well to research shipment and delivery of patient's medication.  Patient verbalized understanding, no further questions.

## 2021-10-22 ENCOUNTER — Telehealth: Payer: Self-pay | Admitting: *Deleted

## 2021-10-22 NOTE — Telephone Encounter (Signed)
Do we have a discharge letter ready? She has been rude to every staff member and she has said completely conflicting things about the same staff member. We need to transfer her care to another practice. We have all had enough.   Malachi Bonds, MD [facility]

## 2021-10-22 NOTE — Telephone Encounter (Signed)
FYI: Melissa Chavez came into the clinic today intending to receive her allergy injections. She stopped by the front desk first to speak with [name] and inquired about who was working in the injection room.  [name] informed her that myself and [name] were both giving injections today. Melissa Chavez told [name] that she preferred I administer her injections today. When she signed in for her injections, [name] said "Good morning, how are you" and [name] did not respond so [name] went on working. [name] then asked me about another patients Nucala so I helped her find it in the fridge prior to engaging with Melissa Chavez. Melissa Chavez then said, "excuse me" with a harsh tone so I asked her how could I help her as politely as I could. She proceeded to tell me about a bug bite that she believed was a spider bite on her inner arm and asked if it was still okay to get her shots. I did tell her that it didn't look bad and could be any number of things but that it would not cause any issues with her injections today. I did tell her to keep a close eye on it and if it were to get bigger or change appearance to see urgent care over the weekend. She then told me that she has to take Zyrtec after her injections but that she has a busy day planned and it makes her sleepy. I did tell her that she could wait to get her injections if today was not a good day for her. She responded with "I'm aware of that" in a very snappy tone. I told her it was completely up to her and I would be happy to do whatever she preferred. [name] happened to glance in our direction which prompted Melissa Chavez to very rudely ask her "would you like to join in this conversation, [name]". [name] said "I'm sorry?" Melissa Chavez said "would you like to join into this conversation and get in my business?" [name] said, "I'm sorry, I'm just working in the injection room too." I said "we are both working in here today". Melissa Chavez said "I'm aware that you are BOTH working in here today but  [name] you didn't even acknowledge that I was here when I came in. (Even though she said good morning) She said that [place] rudely interrupted our conversation. It ended up with her not getting her injections because she was uncomfortable with her bug bite.

## 2021-10-25 NOTE — Telephone Encounter (Signed)
I agree with this decision. This was my third encounter with her and each time, she completely twists my words around to make it sound like I do not have her best interest at heart. Even Friday, she went and told [name] that I completely ignored her "spider bite" and that all I said was "it's up to you". I support the decision to discharge.

## 2021-10-26 ENCOUNTER — Other Ambulatory Visit: Payer: Self-pay

## 2021-10-26 ENCOUNTER — Ambulatory Visit: Payer: Medicare Other

## 2021-10-26 MED ORDER — ALBUTEROL SULFATE HFA 108 (90 BASE) MCG/ACT IN AERS: INHALATION_SPRAY | RESPIRATORY_TRACT | 1 refills | Status: AC

## 2021-10-29 ENCOUNTER — Telehealth: Payer: Self-pay | Admitting: Allergy & Immunology

## 2021-10-29 NOTE — Telephone Encounter (Signed)
Sounds good.   Melissa B[place]qua, MD [facility]  

## 2021-10-29 NOTE — Telephone Encounter (Signed)
Melissa Chavez called in for an appointment sinus infection and stated she doesn't feel good.  Melissa Chavez stated she would like to make an appointment and wanted to know who had something open today.  I offered her 2 appointments with Dr. Marlynn Perking for today at 10:00 am and 3:00 pm.  Melissa Chavez asked me who was Dr. Marlynn Perking and I told her she was a Doctor here.  She stated we had a lot of doctors and she didn't know who Dr. Marlynn Perking was and didn't want to see her.  Melissa Chavez stated she made a call earlier to her insurance and made a tele-health appointment and was just going to stick with that instead and declined to schedule an appointment with [place].

## 2021-10-29 NOTE — Telephone Encounter (Signed)
Patient called this morning stating that she wanted to speak to Dr. Dellis Anes or Dr. Randell Patient nurse. She then asked if there was any open appointments today. I asked her for her name and date of birth and asked her was she having a problem this morning. She stated yes but wouldn't tell me what was going on. I told her I would check for an open appointment. She was fine talking to me at first until she asked my name. I stated my name is Vernona Rieger and I will check for an open appointment for today. She then stated I knew this was Vernona Rieger or [place]. It was then that her whole attitude changed. She then stated where is [name]? I want to speak to [place]. I stated could she please hold a moment. [name] was on the phone at that time so I came back on the line with the patient and explained to her that [name] was on the phone and could she please hold. Patient then ignored me and didn't respond back. I placed patient on hold and when [name] was available I came back on the line and stated that [name] was now available and I will transfer her to her, but she didn't respond to me. I then transferred her to [name] and she proceeded to tell [name] that I was nasty to her and rude, which was not the case. I was in the office with others that heard my side of the conversation and knew that I wasn't being nasty or rude to this patient.

## 2021-10-29 NOTE — Telephone Encounter (Signed)
Noted. We are in the process of dismissing this patient.   Malachi Bonds, MD [facility]

## 2021-11-02 ENCOUNTER — Ambulatory Visit: Payer: Medicare Other | Admitting: Podiatry

## 2021-11-02 DIAGNOSIS — M10079 Idiopathic gout, unspecified ankle and foot: Secondary | ICD-10-CM | POA: Diagnosis not present

## 2021-11-02 DIAGNOSIS — R42 Dizziness and giddiness: Secondary | ICD-10-CM | POA: Diagnosis not present

## 2021-11-02 DIAGNOSIS — R197 Diarrhea, unspecified: Secondary | ICD-10-CM | POA: Diagnosis not present

## 2021-11-04 ENCOUNTER — Encounter: Payer: Self-pay | Admitting: Podiatry

## 2021-11-04 ENCOUNTER — Ambulatory Visit: Payer: Medicare Other | Admitting: Podiatry

## 2021-11-04 DIAGNOSIS — E1121 Type 2 diabetes mellitus with diabetic nephropathy: Secondary | ICD-10-CM

## 2021-11-04 DIAGNOSIS — B351 Tinea unguium: Secondary | ICD-10-CM

## 2021-11-04 DIAGNOSIS — M79674 Pain in right toe(s): Secondary | ICD-10-CM | POA: Diagnosis not present

## 2021-11-04 DIAGNOSIS — M79675 Pain in left toe(s): Secondary | ICD-10-CM | POA: Diagnosis not present

## 2021-11-04 DIAGNOSIS — L84 Corns and callosities: Secondary | ICD-10-CM | POA: Diagnosis not present

## 2021-11-04 NOTE — Progress Notes (Signed)
Subjective:   Patient ID: Melissa Chavez, female   DOB: 61 y.o.   MRN: [id number]   HPI Long-term diabetic with chronic lesions first metatarsal of both feet plantar and elongated nailbeds 1-5 both feet that are difficult for her to take care of   ROS      Objective:  Physical Exam  Vascular intact neurologically diminished associated with probable moderate diabetic neuropathy with patient found to have keratotic lesion x2 and thick yellow elongated brittle nailbeds that can become painful 1-5 both feet     Assessment:  Patient who does have diabetic neuropathic condition at risk with lesion x2 and nail disease with pain 1-5 both feet     Plan:  H&P reviewed conditions debrided lesions bilateral no angiogenic bleeding debrided nailbeds 1-5 both feet no iatrogenic bleeding reappoint routine care discussed daily foot inspections and explained diabetic foot care

## 2021-11-05 NOTE — Telephone Encounter (Signed)
To do our due diligence, Beth and I decided to call her PCP to make sure that there was no organic reason (such as dementia) that was contributing to her mood changes and rudeness. I talked to her PCP's nurse, Harriett Sine, who was going to have the NP called me who saw her last. I left my cell phone with Harriett Sine so the NP could call me when she is able.  Malachi Bonds, MD [facility]

## 2021-11-16 ENCOUNTER — Ambulatory Visit (INDEPENDENT_AMBULATORY_CARE_PROVIDER_SITE_OTHER): Payer: Medicare Other | Admitting: *Deleted

## 2021-11-16 DIAGNOSIS — J4550 Severe persistent asthma, uncomplicated: Secondary | ICD-10-CM

## 2021-11-27 DIAGNOSIS — Z1231 Encounter for screening mammogram for malignant neoplasm of breast: Secondary | ICD-10-CM | POA: Diagnosis not present

## 2021-11-28 ENCOUNTER — Encounter: Payer: Self-pay | Admitting: *Deleted

## 2021-11-29 ENCOUNTER — Telehealth: Payer: Self-pay

## 2021-11-29 NOTE — Telephone Encounter (Signed)
Opened by accident

## 2021-11-30 ENCOUNTER — Ambulatory Visit (INDEPENDENT_AMBULATORY_CARE_PROVIDER_SITE_OTHER): Payer: Medicare Other | Admitting: Allergy & Immunology

## 2021-11-30 DIAGNOSIS — J3089 Other allergic rhinitis: Secondary | ICD-10-CM

## 2021-11-30 DIAGNOSIS — J0100 Acute maxillary sinusitis, unspecified: Secondary | ICD-10-CM

## 2021-11-30 DIAGNOSIS — J302 Other seasonal allergic rhinitis: Secondary | ICD-10-CM

## 2021-11-30 DIAGNOSIS — J4550 Severe persistent asthma, uncomplicated: Secondary | ICD-10-CM

## 2021-11-30 DIAGNOSIS — J984 Other disorders of lung: Secondary | ICD-10-CM | POA: Diagnosis not present

## 2021-11-30 MED ORDER — AMOXICILLIN-POT CLAVULANATE 875-125 MG PO TABS: 1.0000 | ORAL_TABLET | Freq: Two times a day (BID) | ORAL | 0 refills | Status: AC

## 2021-11-30 NOTE — Patient Instructions (Signed)
Seasonal and perennial allergic rhinitis with conjunctivitis  -Continue avoidance measures for grasses, ragweed, weeds, trees, indoor molds, outdoor molds, dust mites, cat, dog and cockroach - Continue with: Xyzal (levocetirizine) 5mg  tablet once daily Astelin (azelastine) 2 sprays per nostril 1-2 times daily as needed Bepreve 1 drop each eye twice a day as needed for itchy, watery or red eyes. Advised can use a lubricating eyedrop during the day when using Bepreve  - We are currently holding allergy shots (last allergy shot was in July 2023). - We can just spend some time focusing on your breathing instead.  - Start Augmentin TWICE DAILY for TEN DAYS to treat your sinus infection.   2. Moderate persistent asthma, uncomplicated - Daily controller medication(s): Breztri two puffs two puffs twice daily with spacer + Fasenra every 8 weeks thereafter - Prior to physical activity: albuterol 2 puffs 10-15 minutes before physical activity. 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 - Wynona Luna is up to date.   Eczema  - You should not use the triamcinolone cream on your face at all.  - We can use Elidel twice daily as needed instead.   Return in about 6 months (around 05/31/2022). If you want to transfer care to another practice, we are happy to help you out with transferring to another place ([facility] is actually on [address] and might be closer for you).    Please inform us of any Emergency Department visits, hospitalizations, or changes in  symptoms. Call us before going to the ED for breathing or allergy symptoms since we might be able to fit you in for a sick visit. Feel free to contact us anytime with any questions, problems, or concerns.  It was a pleasure to see you again today!  Websites that have reliable patient information: 1. American Academy of Asthma, Allergy, and Immunology: www.aaaai.org 2. Food Allergy Research and Education (FARE): foodallergy.org 3. Mothers of Asthmatics: http://www.asthmacommunitynetwork.org 4. American College of Allergy, Asthma, and Immunology: www.acaai.org   COVID-19 Vaccine Information can be found at: ShippingScam.co.uk For questions related to vaccine distribution or appointments, please email [email or web address] or call [phone].   We realize that you might be concerned about having an allergic reaction to the COVID19 vaccines. To help with that concern, WE ARE OFFERING THE COVID19 VACCINES IN OUR OFFICE! Ask the front desk for dates!     "Like" [place] on Facebook and Instagram for our latest updates!      A healthy democracy works best when [employer] participate! Make sure you are registered to vote! If you have moved or changed any of your contact information, you will need to get this updated before voting!  In some cases, you MAY be able to register to vote online: [email or web address]

## 2021-11-30 NOTE — Progress Notes (Signed)
FOLLOW UP  Date of Service/Encounter:  11/30/21   Assessment:   Moderate persistent asthma, uncomplicated - started Fasenra today   Seasonal and perennial allergic rhinitis (grasses, ragweed, weeds, trees, indoor molds, outdoor molds, dust mites, cat, dog, cockroach, and rabbit) - on allergen immunotherapy   Anaphylactic shock due to food (catfish, bass, trout, flounder, and cod)    Eosinophilia - down to 600 as of the last time we checked   Elevated troponin - with normal EKG and echocardiogram   Elevated vitamin B12 - stopped supplementation   Ear itching    Plan/Recommendations:    There are no Patient Instructions on file for this visit.   Subjective:   Melissa Chavez is a 61 y.o. female presenting today for follow up of No chief complaint on file.   Melissa Chavez has a history of the following: Patient Active Problem List   Diagnosis Date Noted  . Chronic kidney disease 01/18/2021  . Chronic kidney disease, stage 3a ([place]) 01/18/2021  . DDD (degenerative disc disease), cervical 01/18/2021  . Diabetic renal disease ([place]) 01/18/2021  . Idiopathic gout 01/18/2021  . Long term (current) use of insulin ([place]) 01/18/2021  . Hyperlipidemia, unspecified 01/18/2021  . Morbid obesity ([place]) 01/18/2021  . Unilateral primary osteoarthritis, left knee 09/13/2016  . Unilateral primary osteoarthritis, right knee 09/13/2016  . Pressure injury of skin 03/08/2016  . Acute gout of left knee 03/07/2016  . Sinus congestion 11/29/2015  . Bilateral leg edema 09/25/2015  . Abdominal pain, left upper quadrant 09/25/2015  . 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 Prednisone adverse reaction 08/20/2015  . Abdominal pain, chronic, epigastric 06/30/2015  . Osteoarthritis of right foot 03/22/2015  . Type 2 diabetes mellitus with diabetic polyneuropathy, without long-term current use of insulin ([place]) 03/20/2015  . BMI 40.0-44.9, adult ([place]) 06/13/2013  .  Overactive bladder 06/13/2013  . Allergic rhinitis 06/13/2013  . Asthma, moderate persistent 06/13/2013  . BIPOLAR DISORDER UNSPECIFIED 03/18/2008  . PERIPHERAL EDEMA 07/03/2007  . GERD 01/30/2007  . HYPERCHOLESTEROLEMIA 12/07/2006  . Essential hypertension 12/07/2006  . Osteoarthritis of right knee 12/07/2006  . ANEMIA, IRON DEFICIENCY 01/06/2006    History obtained from: chart review and patient.  Melissa Chavez is a 61 y.o. female presenting for a follow up visit.   She reports that hear ears and nose and all of her sinuses are all congested. Her daughter has been diagnosed with a sinus infections. She is now working for the [employer] and she reports that she has been having chills. She has a chronic cough. She has been doing a lot of OTC medications with minimal relief. She has bene using some allergy eye drops that dry out her eyes.   She has not had antibiotics since she had that spider bite.   She is going to [facility] [employer] [facility] [employer]  She was [facility]. She has a recent glucose of 325 or 140.  She has a history of gout.   She is working on [employer] at [employer]. She is working for the [employer]   {Blank single:19197::"Asthma/Respiratory Symptom History: ***"," "}  {Blank single:19197::"Allergic Rhinitis Symptom History: ***"," "}  {Blank single:19197::"Food Allergy Symptom History: ***"," "}  {Blank single:19197::"Skin Symptom History: ***"," "}  {Blank single:19197::"GERD Symptom History: ***"," "}  Her Mom is living in a nursing home. She is a fall risk and is 61 years old.   Otherwise, there have been no changes  to her past medical history, surgical history, family history, or social history.    ROS     Objective:   Last menstrual period 05/30/2013. There is no height or weight on file to calculate BMI.    Physical Exam   Diagnostic studies: {Blank single:19197::"none","deferred due to recent  antihistamine use","labs sent instead"," "}  Spirometry: {Blank single:19197::"results normal (FEV1: ***%, FVC: ***%, FEV1/FVC: ***%)","results abnormal (FEV1: ***%, FVC: ***%, FEV1/FVC: ***%)"}.    {Blank single:19197::"Spirometry consistent with mild obstructive disease","Spirometry consistent with moderate obstructive disease","Spirometry consistent with severe obstructive disease","Spirometry consistent with possible restrictive disease","Spirometry consistent with mixed obstructive and restrictive disease","Spirometry uninterpretable due to technique","Spirometry consistent with normal pattern"}. {Blank single:19197::"Albuterol/Atrovent nebulizer","Xopenex/Atrovent nebulizer","Albuterol nebulizer","Albuterol four puffs via MDI","Xopenex four puffs via MDI"} treatment given in clinic with {Blank single:19197::"significant improvement in FEV1 per ATS criteria","significant improvement in FVC per ATS criteria","significant improvement in FEV1 and FVC per ATS criteria","improvement in FEV1, but not significant per ATS criteria","improvement in FVC, but not significant per ATS criteria","improvement in FEV1 and FVC, but not significant per ATS criteria","no improvement"}.  Allergy Studies: {Blank single:19197::"none","labs sent instead"," "}    {Blank single:19197::"Allergy testing results were read and interpreted by myself, documented by clinical staff."," "}      Salvatore Marvel, MD  [facility] [facility]

## 2021-12-02 ENCOUNTER — Encounter: Payer: Self-pay | Admitting: Allergy & Immunology

## 2021-12-29 DIAGNOSIS — N1831 Chronic kidney disease, stage 3a: Secondary | ICD-10-CM | POA: Diagnosis not present

## 2021-12-29 DIAGNOSIS — E782 Mixed hyperlipidemia: Secondary | ICD-10-CM | POA: Diagnosis not present

## 2021-12-29 DIAGNOSIS — I10 Essential (primary) hypertension: Secondary | ICD-10-CM | POA: Diagnosis not present

## 2021-12-29 DIAGNOSIS — Z794 Long term (current) use of insulin: Secondary | ICD-10-CM | POA: Diagnosis not present

## 2021-12-29 DIAGNOSIS — E1122 Type 2 diabetes mellitus with diabetic chronic kidney disease: Secondary | ICD-10-CM | POA: Diagnosis not present

## 2022-01-04 DIAGNOSIS — E782 Mixed hyperlipidemia: Secondary | ICD-10-CM | POA: Diagnosis not present

## 2022-01-04 DIAGNOSIS — N1831 Chronic kidney disease, stage 3a: Secondary | ICD-10-CM | POA: Diagnosis not present

## 2022-01-04 DIAGNOSIS — N3281 Overactive bladder: Secondary | ICD-10-CM | POA: Diagnosis not present

## 2022-01-04 DIAGNOSIS — M1A09X0 Idiopathic chronic gout, multiple sites, without tophus (tophi): Secondary | ICD-10-CM | POA: Diagnosis not present

## 2022-01-04 DIAGNOSIS — E1122 Type 2 diabetes mellitus with diabetic chronic kidney disease: Secondary | ICD-10-CM | POA: Diagnosis not present

## 2022-01-04 DIAGNOSIS — I10 Essential (primary) hypertension: Secondary | ICD-10-CM | POA: Diagnosis not present

## 2022-01-04 DIAGNOSIS — Z0000 Encounter for general adult medical examination without abnormal findings: Secondary | ICD-10-CM | POA: Diagnosis not present

## 2022-01-04 DIAGNOSIS — J4520 Mild intermittent asthma, uncomplicated: Secondary | ICD-10-CM | POA: Diagnosis not present

## 2022-01-04 DIAGNOSIS — Z23 Encounter for immunization: Secondary | ICD-10-CM | POA: Diagnosis not present

## 2022-01-13 ENCOUNTER — Ambulatory Visit (INDEPENDENT_AMBULATORY_CARE_PROVIDER_SITE_OTHER): Payer: Medicare Other

## 2022-01-13 DIAGNOSIS — J4550 Severe persistent asthma, uncomplicated: Secondary | ICD-10-CM

## 2022-01-26 ENCOUNTER — Encounter: Payer: Self-pay | Admitting: Allergy

## 2022-01-26 ENCOUNTER — Ambulatory Visit (INDEPENDENT_AMBULATORY_CARE_PROVIDER_SITE_OTHER): Payer: Medicare Other | Admitting: Allergy

## 2022-01-26 ENCOUNTER — Other Ambulatory Visit: Payer: Self-pay

## 2022-01-26 VITALS — BP 130/84 | HR 65 | Temp 97.6°F | Resp 16 | Ht 59.0 in | Wt 195.8 lb

## 2022-01-26 DIAGNOSIS — N1831 Chronic kidney disease, stage 3a: Secondary | ICD-10-CM | POA: Diagnosis not present

## 2022-01-26 DIAGNOSIS — H109 Unspecified conjunctivitis: Secondary | ICD-10-CM

## 2022-01-26 DIAGNOSIS — J4550 Severe persistent asthma, uncomplicated: Secondary | ICD-10-CM

## 2022-01-26 DIAGNOSIS — J3089 Other allergic rhinitis: Secondary | ICD-10-CM

## 2022-01-26 DIAGNOSIS — L2089 Other atopic dermatitis: Secondary | ICD-10-CM

## 2022-01-26 DIAGNOSIS — E1122 Type 2 diabetes mellitus with diabetic chronic kidney disease: Secondary | ICD-10-CM | POA: Diagnosis not present

## 2022-01-26 DIAGNOSIS — I10 Essential (primary) hypertension: Secondary | ICD-10-CM | POA: Diagnosis not present

## 2022-01-26 DIAGNOSIS — H1013 Acute atopic conjunctivitis, bilateral: Secondary | ICD-10-CM

## 2022-01-26 DIAGNOSIS — T7800XD Anaphylactic reaction due to unspecified food, subsequent encounter: Secondary | ICD-10-CM | POA: Diagnosis not present

## 2022-01-26 DIAGNOSIS — J302 Other seasonal allergic rhinitis: Secondary | ICD-10-CM

## 2022-01-26 DIAGNOSIS — E782 Mixed hyperlipidemia: Secondary | ICD-10-CM | POA: Diagnosis not present

## 2022-01-26 MED ORDER — POLYMYXIN B-TRIMETHOPRIM 10000-0.1 UNIT/ML-% OP SOLN: OPHTHALMIC | 0 refills | Status: AC

## 2022-01-26 MED ORDER — LEVOCETIRIZINE DIHYDROCHLORIDE 5 MG PO TABS: 5.0000 mg | ORAL_TABLET | Freq: Every day | ORAL | 1 refills | Status: AC

## 2022-01-26 MED ORDER — BREZTRI AEROSPHERE 160-9-4.8 MCG/ACT IN AERO
2.0000 | INHALATION_SPRAY | Freq: Two times a day (BID) | RESPIRATORY_TRACT | 3 refills | Status: AC
Start: 2022-01-26 — End: ?

## 2022-01-26 MED ORDER — TRIAMCINOLONE ACETONIDE 0.1 % EX OINT: TOPICAL_OINTMENT | CUTANEOUS | 1 refills | Status: AC

## 2022-01-26 MED ORDER — PIMECROLIMUS 1 % EX CREA
TOPICAL_CREAM | Freq: Two times a day (BID) | CUTANEOUS | 2 refills | Status: AC
Start: 2022-01-26 — End: ?

## 2022-01-26 MED ORDER — AZELASTINE HCL 0.1 % NA SOLN: 2.0000 | Freq: Two times a day (BID) | NASAL | 5 refills | Status: AC

## 2022-01-26 MED ORDER — BEPOTASTINE BESILATE 1.5 % OP SOLN: 1.0000 [drp] | Freq: Every day | OPHTHALMIC | 5 refills | Status: AC | PRN

## 2022-01-26 NOTE — Patient Instructions (Addendum)
Bacterial conjunctivitis - will treat for pink eye as you do have crusting, swelling and redness to the eye - use Polytrim both eyes 1 drop every 3 hours (max 6 dose a day) for next 7 days - continue warm compresses to eye twice a day - perform good hand hygiene to help prevent spreading  Seasonal and perennial allergic rhinitis with conjunctivitis  - Continue avoidance measures for grasses, ragweed, weeds, trees, indoor molds, outdoor molds, dust mites, cat, dog and cockroach. - Continue with: Xyzal (levocetirizine) 5mg  tablet once daily. Astelin (azelastine) 2 sprays per nostril 1-2 times daily as needed. Bepreve 1 drop each eye twice a day as needed for itchy, watery or red eyes. Can use a lubricating eyedrop during the day when using Bepreve. - We are currently holding allergy shots (last allergy shot was in July 2023).  Will ask Dr August 2023 when you may be able to resume allergy shots.   Moderate persistent asthma - Daily controller medication(s): Breztri two puffs two puffs twice daily with spacer. Fasenra injections every 8 weeks. - Prior to physical activity: albuterol 2 puffs 10-15 minutes before physical activity. 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 - Dellis Anes is up to date.   Eczema  - triamcinolone cream twice a day as needed for eczema flares on body - Elidel twice daily as needed for eczema flares on face or other sensitive areas  Follow-up with Dr Audry Riles in 4-6 months or sooner if needed

## 2022-01-26 NOTE — Progress Notes (Signed)
Follow-up Note  RE: SUMMERS BUENDIA MRN: [id number] DOB: 1960/09/03 Date of Office Visit: 01/26/2022   History of present illness: ZSOFIA PROUT is a 61 y.o. female presenting today for sick visit.   She was last seen in the office on 11/30/21 by Dr Ernst Bowler, primary allergist.  She has history of allergic rhinitis with conjunctivitis, asthma, eczema and food allergy.   She states she has been having eye swelling for past 2 days.  She states she has been using her eye drops of bepreve.  She uses cold compress last night.  No oozing but states she did have crusting in the corner of eye.   She has not had any matting of the eye yet.  She also states her the eye is itchy like something is in it.  She also states she has been having nasal congestion and ear pain (states she always has ear pain however).  She states her mother is in assisted living and she does have Covid currently.  She states her daughters that have been seeing her mother tested negative for Covid.  She states she still takes xyzal, singulair daily.  She did take benadryl on Monday for itching.  She states she does not use astelin "like I should".   She continues to use Breztri 2 puffs twice a day and continues on [place] every 8 weeks.  She denies having any increase in asthma symptoms with her current eye symptoms. She continues to avoid her allergic foods and has access to an epinephrine device. At this acute visit not reporting any issues with eczema.  Review of systems: Review of Systems  Constitutional: Negative.   HENT:  Positive for congestion.   Eyes:  Positive for discharge, redness and itching.  Respiratory: Negative.    Cardiovascular: Negative.   Gastrointestinal: Negative.   Musculoskeletal: Negative.   Skin: Negative.   Allergic/Immunologic: Negative.   Neurological: Negative.      All other systems negative unless noted above in HPI  Past medical/social/surgical/family history have been reviewed  and are unchanged unless specifically indicated below.  No changes  Medication List: Current Outpatient Medications  Medication Sig Dispense Refill   ACCU-CHEK SOFTCLIX LANCETS lancets TEST BLOOD SUGAR ONCE DAILY 100 each 0   albuterol (PROVENTIL) (2.5 MG/3ML) 0.083% nebulizer solution TAKE 3MLS BY NEBULIZATION EVERY 6 HOURS AS NEEDED 75 mL 1   albuterol (VENTOLIN HFA) 108 (90 Base) MCG/ACT inhaler USE 2 INHALATIONS BY MOUTH  EVERY 6 HOURS AS NEEDED FOR WHEEZING 54 g 1   Benralizumab (FASENRA PEN) 30 MG/ML SOAJ Inject 1 mL (30 mg total) into the skin every 28 (twenty-eight) days. For 3 doses then every 8 weeks 0.28 mL 8   Blood Glucose Calibration (ACCU-CHEK AVIVA) SOLN Test blood sugar once daily. Dx: E11.42 1 each 3   blood glucose meter kit and supplies KIT Use as directed 4 times a day.  DX: R73.09 1 each 0   Blood Glucose Monitoring Suppl (ACCU-CHEK AVIVA PLUS) w/Device KIT TEST BLOOD SUGAR ONCE  DAILY. 1 kit 0   dapagliflozin propanediol (FARXIGA) 5 MG TABS tablet 1 tablet     enalapril (VASOTEC) 20 MG tablet Take 20 mg by mouth daily.     enalapril (VASOTEC) 5 MG tablet 1 tablet Orally Once a day     febuxostat (ULORIC) 40 MG tablet Take 40 mg by mouth daily.     glucose blood (ACCU-CHEK AVIVA PLUS) test strip Test blood sugar once daily. Dx: J00.93  100 each 3   hydrOXYzine (ATARAX/VISTARIL) 25 MG tablet 1 tablet as needed     insulin degludec (TRESIBA FLEXTOUCH) 100 UNIT/ML FlexTouch Pen 20 units     Insulin Pen Needle (B-D UF III MINI PEN NEEDLES) 31G X 5 MM MISC See admin instructions.     methocarbamol (ROBAXIN) 750 MG tablet Take 750 mg by mouth as needed for muscle spasms.     montelukast (SINGULAIR) 10 MG tablet 1 tablet     Multiple Vitamins-Minerals (CENTRUM SILVER 50+WOMEN PO) Take 1 tablet by mouth daily.     omeprazole (PRILOSEC) 40 MG capsule 1 capsule 30 minutes before morning meal     oxybutynin (DITROPAN XL) 15 MG 24 hr tablet TAKE 1 TABLET BY MOUTH AT  BEDTIME 90  tablet 0   PARoxetine (PAXIL) 10 MG tablet Take 10 mg by mouth daily.     rosuvastatin (CRESTOR) 20 MG tablet Take 20 mg by mouth daily.     Semaglutide, 1 MG/DOSE, (OZEMPIC, 1 MG/DOSE,) 4 MG/3ML SOPN 1 mg Subcutaneous Once a week for 30 days     Semaglutide,0.25 or 0.5MG/DOS, (OZEMPIC, 0.25 OR 0.5 MG/DOSE,) 2 MG/1.5ML SOPN See admin instructions.     tiZANidine (ZANAFLEX) 2 MG tablet Take 2 mg by mouth 3 (three) times daily.     triamterene-hydrochlorothiazide (MAXZIDE-25) 37.5-25 MG tablet Take 1 tablet by mouth daily.      trimethoprim-polymyxin b (POLYTRIM) ophthalmic solution Apply to both eyes 1 drop every 3 hours (max 6 dose a day) for next 7 days 10 mL 0   azelastine (ASTELIN) 0.1 % nasal spray Place 2 sprays into both nostrils 2 (two) times daily. 30 mL 5   Bepotastine Besilate 1.5 % SOLN Place 1 drop into both eyes daily as needed. 10 mL 5   BREZTRI AEROSPHERE 160-9-4.8 MCG/ACT AERO Inhale 2 puffs into the lungs in the morning and at bedtime. 32.1 g 3   EPINEPHrine 0.3 mg/0.3 mL IJ SOAJ injection ADMINISTER 0.3 MG IN THE MUSCLE 1 TIME FOR 1 DOSE (Patient not taking: Reported on 01/26/2022)     levocetirizine (XYZAL) 5 MG tablet Take 1 tablet (5 mg total) by mouth daily. 90 tablet 1   pimecrolimus (ELIDEL) 1 % cream Apply topically 2 (two) times daily. 100 g 2   triamcinolone ointment (KENALOG) 0.1 % Apply 3-4 times daily for 2 days after each injection to affected areas. 453.6 g 1   Current Facility-Administered Medications  Medication Dose Route Frequency Provider Last Rate Last Admin   Benralizumab SOSY 30 mg  30 mg Subcutaneous Q28 days Valentina Shaggy, MD   30 mg at 01/13/22 1504     Known medication allergies: Allergies  Allergen Reactions   Fish Allergy Anaphylaxis   Other Anaphylaxis and Rash    Cockroaches, rag weed, pollen,- showed up in allergy test unknown reaction Cockroaches, rag weed, pollen,- showed up in allergy test unknown reaction Cockroaches, rag weed,  pollen,- showed up in allergy test unknown reaction   Banana Other (See Comments)    unknown   Empagliflozin Other (See Comments)    Other reaction(s): Unknown   Metformin Other (See Comments)    Other reaction(s): Unknown   Prednisone Other (See Comments)    Anxiety, erratic behavior, ?hallucinations Requiring ED and psychiatric evaluation     Physical examination: Blood pressure 130/84, pulse 65, temperature 97.6 F (36.4 C), resp. rate 16, height _0  (1.499 m), weight 195 lb 12.8 oz (88.8 kg), last menstrual period 05/30/2013, SpO2  98 %.  General: Alert, interactive, in no acute distress. HEENT: PERRLA, right eye with mild periorbital edema with mild scleral injection as well as discharge from the inner corner of the eyelids and crusting, left eye normal, TMs pearly gray, turbinates mildly edematous without discharge, post-pharynx non erythematous. Neck: Supple without lymphadenopathy. Lungs: Clear to auscultation without wheezing, rhonchi or rales. {no increased work of breathing. CV: Normal S1, S2 without murmurs. Abdomen: Nondistended, nontender. Skin: Warm and dry, without lesions or rashes. Extremities:  No clubbing, cyanosis or edema. Neuro:   Grossly intact.  Diagnositics/Labs: Spirometry: FEV1: 0.63L 38%, FVC: 1.11L 53% predicted.  This is actually quite stable  Assessment and plan: Bacterial conjunctivitis - will treat for pink eye as you do have crusting, swelling and redness to the eye - use Polytrim both eyes 1 drop every 3 hours (max 6 dose a day) for next 7 days - continue warm compresses to eye twice a day - perform good hand hygiene to help prevent spreading  Seasonal and perennial allergic rhinitis with conjunctivitis  - Continue avoidance measures for grasses, ragweed, weeds, trees, indoor molds, outdoor molds, dust mites, cat, dog and cockroach. - Continue with: Xyzal (levocetirizine) 35m tablet once daily. Astelin (azelastine) 2 sprays per nostril 1-2  times daily as needed. Bepreve 1 drop each eye twice a day as needed for itchy, watery or red eyes. Can use a lubricating eyedrop during the day when using Bepreve. - We are currently holding allergy shots (last allergy shot was in July 2023).  Will ask Dr GErnst Bowlerwhen you may be able to resume allergy shots.   Moderate persistent asthma - Daily controller medication(s): Breztri two puffs two puffs twice daily with spacer. Fasenra injections every 8 weeks. - Prior to physical activity: albuterol 2 puffs 10-15 minutes before physical activity. 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 - AWynona Lunais up to date.   Eczema  - triamcinolone cream twice a day as needed for eczema flares on body - Elidel twice daily as needed for eczema flares on face or other sensitive areas  Follow-up with Dr GErnst Bowlerin 4-6 months or sooner if needed  I appreciate the opportunity to take part in [facility]. Please do not hesitate to contact me with questions.  Sincerely,   SPrudy Feeler MD Allergy/Immunology [facility]

## 2022-02-07 ENCOUNTER — Encounter: Payer: Self-pay | Admitting: Podiatry

## 2022-02-07 ENCOUNTER — Ambulatory Visit (INDEPENDENT_AMBULATORY_CARE_PROVIDER_SITE_OTHER): Payer: Medicare Other | Admitting: Podiatry

## 2022-02-07 VITALS — BP 128/74

## 2022-02-07 DIAGNOSIS — E119 Type 2 diabetes mellitus without complications: Secondary | ICD-10-CM

## 2022-02-07 DIAGNOSIS — L84 Corns and callosities: Secondary | ICD-10-CM

## 2022-02-07 DIAGNOSIS — M79674 Pain in right toe(s): Secondary | ICD-10-CM | POA: Diagnosis not present

## 2022-02-07 DIAGNOSIS — M79675 Pain in left toe(s): Secondary | ICD-10-CM | POA: Diagnosis not present

## 2022-02-07 DIAGNOSIS — M2011 Hallux valgus (acquired), right foot: Secondary | ICD-10-CM

## 2022-02-07 DIAGNOSIS — M21622 Bunionette of left foot: Secondary | ICD-10-CM

## 2022-02-07 DIAGNOSIS — B351 Tinea unguium: Secondary | ICD-10-CM | POA: Diagnosis not present

## 2022-02-07 DIAGNOSIS — E1142 Type 2 diabetes mellitus with diabetic polyneuropathy: Secondary | ICD-10-CM | POA: Diagnosis not present

## 2022-02-07 DIAGNOSIS — M159 Polyosteoarthritis, unspecified: Secondary | ICD-10-CM | POA: Insufficient documentation

## 2022-02-07 DIAGNOSIS — M2012 Hallux valgus (acquired), left foot: Secondary | ICD-10-CM | POA: Diagnosis not present

## 2022-02-07 DIAGNOSIS — M21621 Bunionette of right foot: Secondary | ICD-10-CM | POA: Diagnosis not present

## 2022-02-07 NOTE — Progress Notes (Signed)
ANNUAL DIABETIC FOOT EXAM  Subjective: Melissa Chavez presents today for annual diabetic foot examination.  Chief Complaint  Patient presents with   Nail Problem    Diabetic foot care BS-143 A1C-7.0 PCP-Ramachandran PCP VST-01/04/2022   Patient confirms h/o diabetes.  Patient denies any h/o foot wounds.  Patient has diagnosis of neuropathy.  Risk factors:  h/o gout, diabetes, diabetic neuropathy, HTN.  Merrilee Seashore, MD is patient's PCP.  Past Medical History:  Diagnosis Date   Allergy    Anemia    Asthma    Bipolar 2 disorder ([place])    Diabetes mellitus without complication ([place])    GERD (gastroesophageal reflux disease)    Glaucoma    Pt denies   Hernia    Hypertension    Patient Active Problem List   Diagnosis Date Noted   Primary osteoarthritis involving multiple joints 02/07/2022   Chronic kidney disease 01/18/2021   Chronic kidney disease, stage 3a ([place]) 01/18/2021   DDD (degenerative disc disease), cervical 01/18/2021   Diabetic renal disease ([place]) 01/18/2021   Idiopathic gout 01/18/2021   Long term (current) use of insulin ([place]) 01/18/2021   Hyperlipidemia, unspecified 01/18/2021   Morbid obesity ([place]) 01/18/2021   Unilateral primary osteoarthritis, left knee 09/13/2016   Unilateral primary osteoarthritis, right knee 09/13/2016   Pressure injury of skin 03/08/2016   Acute gout of left knee 03/07/2016   Sinus congestion 11/29/2015   Bilateral leg edema 09/25/2015   Abdominal pain, left upper quadrant 09/25/2015   DJD (degenerative joint disease) of upper arm 09/07/2015   Prednisone adverse reaction 08/20/2015   Abdominal pain, chronic, epigastric 06/30/2015   Osteoarthritis of right foot 03/22/2015   Type 2 diabetes mellitus with diabetic polyneuropathy, without long-term current use of insulin ([place]) 03/20/2015   BMI 40.0-44.9, adult ([place]) 06/13/2013   Overactive bladder 06/13/2013   Allergic rhinitis 06/13/2013   Asthma, moderate  persistent 06/13/2013   BIPOLAR DISORDER UNSPECIFIED 03/18/2008   PERIPHERAL EDEMA 07/03/2007   GERD 01/30/2007   HYPERCHOLESTEROLEMIA 12/07/2006   Essential hypertension 12/07/2006   Osteoarthritis of right knee 12/07/2006   ANEMIA, IRON DEFICIENCY 01/06/2006   Past Surgical History:  Procedure Laterality Date   deviated septum surgery x 2      HERNIA REPAIR     TONSILLECTOMY     Pt denies   TUBAL LIGATION     Current Outpatient Medications on File Prior to Visit  Medication Sig Dispense Refill   ACCU-CHEK SOFTCLIX LANCETS lancets TEST BLOOD SUGAR ONCE DAILY 100 each 0   albuterol (PROVENTIL) (2.5 MG/3ML) 0.083% nebulizer solution TAKE 3MLS BY NEBULIZATION EVERY 6 HOURS AS NEEDED 75 mL 1   albuterol (VENTOLIN HFA) 108 (90 Base) MCG/ACT inhaler USE 2 INHALATIONS BY MOUTH  EVERY 6 HOURS AS NEEDED FOR WHEEZING 54 g 1   azelastine (ASTELIN) 0.1 % nasal spray Place 2 sprays into both nostrils 2 (two) times daily. 30 mL 5   Benralizumab (FASENRA PEN) 30 MG/ML SOAJ Inject 1 mL (30 mg total) into the skin every 28 (twenty-eight) days. For 3 doses then every 8 weeks 0.28 mL 8   Bepotastine Besilate 1.5 % SOLN Place 1 drop into both eyes daily as needed. 10 mL 5   Blood Glucose Calibration (ACCU-CHEK AVIVA) SOLN Test blood sugar once daily. Dx: E11.42 1 each 3   blood glucose meter kit and supplies KIT Use as directed 4 times a day.  DX: R73.09 1 each 0   Blood Glucose Monitoring Suppl (ACCU-CHEK AVIVA PLUS)  w/Device KIT TEST BLOOD SUGAR ONCE  DAILY. 1 kit 0   BREZTRI AEROSPHERE 160-9-4.8 MCG/ACT AERO Inhale 2 puffs into the lungs in the morning and at bedtime. 32.1 g 3   dapagliflozin propanediol (FARXIGA) 5 MG TABS tablet 1 tablet     enalapril (VASOTEC) 20 MG tablet Take 20 mg by mouth daily.     enalapril (VASOTEC) 5 MG tablet 1 tablet Orally Once a day     EPINEPHrine 0.3 mg/0.3 mL IJ SOAJ injection ADMINISTER 0.3 MG IN THE MUSCLE 1 TIME FOR 1 DOSE (Patient not taking: Reported on  01/26/2022)     febuxostat (ULORIC) 40 MG tablet Take 40 mg by mouth daily.     glucose blood (ACCU-CHEK AVIVA PLUS) test strip Test blood sugar once daily. Dx: E11.42 100 each 3   hydrOXYzine (ATARAX/VISTARIL) 25 MG tablet 1 tablet as needed     insulin degludec (TRESIBA FLEXTOUCH) 100 UNIT/ML FlexTouch Pen 20 units     Insulin Pen Needle (B-D UF III MINI PEN NEEDLES) 31G X 5 MM MISC See admin instructions.     levocetirizine (XYZAL) 5 MG tablet Take 1 tablet (5 mg total) by mouth daily. 90 tablet 1   methocarbamol (ROBAXIN) 750 MG tablet Take 750 mg by mouth as needed for muscle spasms.     montelukast (SINGULAIR) 10 MG tablet 1 tablet     Multiple Vitamins-Minerals (CENTRUM SILVER 50+WOMEN PO) Take 1 tablet by mouth daily.     omeprazole (PRILOSEC) 40 MG capsule 1 capsule 30 minutes before morning meal     oxybutynin (DITROPAN XL) 15 MG 24 hr tablet TAKE 1 TABLET BY MOUTH AT  BEDTIME 90 tablet 0   PARoxetine (PAXIL) 10 MG tablet Take 10 mg by mouth daily.     pimecrolimus (ELIDEL) 1 % cream Apply topically 2 (two) times daily. 100 g 2   rosuvastatin (CRESTOR) 20 MG tablet Take 20 mg by mouth daily.     Semaglutide, 1 MG/DOSE, (OZEMPIC, 1 MG/DOSE,) 4 MG/3ML SOPN 1 mg Subcutaneous Once a week for 30 days     Semaglutide,0.25 or 0.5MG/DOS, (OZEMPIC, 0.25 OR 0.5 MG/DOSE,) 2 MG/1.5ML SOPN See admin instructions.     tiZANidine (ZANAFLEX) 2 MG tablet Take 2 mg by mouth 3 (three) times daily.     triamcinolone ointment (KENALOG) 0.1 % Apply 3-4 times daily for 2 days after each injection to affected areas. 453.6 g 1   triamterene-hydrochlorothiazide (MAXZIDE-25) 37.5-25 MG tablet Take 1 tablet by mouth daily.      trimethoprim-polymyxin b (POLYTRIM) ophthalmic solution Apply to both eyes 1 drop every 3 hours (max 6 dose a day) for next 7 days 10 mL 0   Current Facility-Administered Medications on File Prior to Visit  Medication Dose Route Frequency Provider Last Rate Last Admin   Benralizumab  SOSY 30 mg  30 mg Subcutaneous Q28 days Valentina Shaggy, MD   30 mg at 01/13/22 1504    Allergies  Allergen Reactions   Fish Allergy Anaphylaxis   Other Anaphylaxis and Rash    Cockroaches, rag weed, pollen,- showed up in allergy test unknown reaction Cockroaches, rag weed, pollen,- showed up in allergy test unknown reaction Cockroaches, rag weed, pollen,- showed up in allergy test unknown reaction   Banana Other (See Comments)    unknown   Empagliflozin Other (See Comments)    Other reaction(s): Unknown   Metformin Other (See Comments)    Other reaction(s): Unknown   Prednisone Other (See Comments)  Anxiety, erratic behavior, ?hallucinations Requiring ED and psychiatric evaluation   Social History   Occupational History   Occupation: disabled    Comment: asthma  Tobacco Use   Smoking status: Never   Smokeless tobacco: Never  Substance and Sexual Activity   Alcohol use: Yes    Alcohol/week: 0.0 standard drinks of alcohol    Comment: Wine, Socially    Drug use: No   Sexual activity: Never   Family History  Problem Relation Age of Onset   Hypertension Mother    Diabetes Mother    Hyperlipidemia Mother    Stroke Mother    Heart disease Mother    Pulmonary embolism Mother    Hypertension Father    Diabetes Father    Hyperlipidemia Father    Immunization History  Administered Date(s) Administered   Influenza Split 11/28/2014   Influenza Whole 12/05/2005, 01/30/2007, 12/19/2007   PFIZER(Purple Top)SARS-COV-2 Vaccination 06/21/2019, 07/12/2019, 02/14/2020   Pneumococcal Polysaccharide-23 08/05/2004   Td 12/05/2005     Review of Systems: Negative except as noted in the HPI.   Objective: Vitals:   02/07/22 0833  BP: 128/74    Maday L Eagleson is a pleasant 61 y.o. female in NAD. AAO X 3. Vitals:   02/07/22 0833  BP: 128/74    Vascular Examination: CFT immediate b/l LE. Palpable DP/PT pulses b/l LE. Digital hair present b/l. Skin temperature gradient  WNL b/l. No pain with calf compression b/l. No edema noted b/l. No cyanosis or clubbing noted b/l LE.  Dermatological Examination: Pedal skin is warm and supple b/l LE. No open wounds b/l LE. No interdigital macerations noted b/l LE. Toenail(s) 1-5 bilaterally elongated, discolored, dystrophic, thickened >1/4 inch. Nails are crumbly with subungual debris and there is exquisite tenderness to dorsal palpation. No subungual wound(s) noted. Hyperkeratotic lesion(s) R hallux, submet head 1 right foot, and submet head 5 b/l.  No erythema, no edema, no drainage, no fluctuance.  Neurological Examination: Pt has subjective symptoms of neuropathy. Protective sensation intact 5/5 intact bilaterally with 10g monofilament b/l. Vibratory sensation intact b/l.  Musculoskeletal Examination: Normal muscle strength 5/5 to all lower extremity muscle groups bilaterally. HAV with bunion deformity noted b/l LE. Tailor's bunion deformity noted b/l LE.Marland Kitchen No pain, crepitus or joint limitation noted with ROM b/l LE.  Patient ambulates independently without assistive aids.  Footwear Assessment: Does the patient wear appropriate shoes? Yes. Does the patient need inserts/orthotics? Yes.  ADA Risk Categorization:  Low Risk :  Patient has all of the following: Intact protective sensation No prior foot ulcer  No severe deformity Pedal pulses present  Assessment: 1. Pain due to onychomycosis of toenails of both feet   2. Callus   3. Hallux valgus, acquired, bilateral   4. Tailor's bunion of both feet   5. Type 2 diabetes mellitus with diabetic polyneuropathy, without long-term current use of insulin ([place])   6. Encounter for diabetic foot exam ([place])     Plan: No orders of the defined types were placed in this encounter.  -Consent given for treatment as described below: -Examined patient. -Counseled patient on dangers of using OTC medicated corn/callus removers. Advised her to discontinue this practice. Patient  agreed and related understanding. -Diabetic foot examination performed today. -Continue supportive shoe gear daily. -Mycotic toenails 1-5 bilaterally were debrided in length and girth with sterile nail nippers and dremel without incident. -Callus(es) R hallux, submet head 1 right foot, submet head 5 left foot, and submet head 5 right foot pared utilizing sterile  scalpel blade without complication or incident. Total number debrided =4. -Patient/POA to call should there be question/concern in the interim. Return in about 3 months (around 05/09/2022).  Marzetta Board, DPM

## 2022-02-07 NOTE — Patient Instructions (Signed)
DISCONTINUE USE OF OVER THE COUNTER MEDICATED CORN/CALLUS REMOVER. THIS COULD CAUSE DEVELOPMENT OF A WOUND ON YOUR FOOT.  Bunion A bunion (hallux valgus) is a bump that forms slowly on the inner side of the big toe joint. It occurs when the big toe turns toward the second toe. Bunions may be small at first, but they often get larger over time. They can make walking painful. What are the causes? This condition may be caused by: Wearing narrow or pointed shoes that force the big toe to press against the other toes. Abnormal foot development that causes the foot to roll inward. Changes in the foot that are caused by certain diseases, such as rheumatoid arthritis or polio. A foot injury. What increases the risk? The following factors may make you more likely to develop this condition: Wearing shoes that squeeze the toes together. Having certain diseases, such as: Rheumatoid arthritis. Polio. Cerebral palsy. Having family members who have bunions. Being born with abnormally shaped feet (a foot deformity), such as flat feet or low arches. Doing activities that put a lot of pressure on the feet, such as ballet dancing. What are the signs or symptoms?  The main symptom of this condition is a bump on your big toe that you can notice. Other symptoms may include: Pain. Redness and inflammation around your big toe. Thick or hardened skin on your big toe or between your toes. Stiffness or loss of motion in your big toe. Trouble with walking. How is this diagnosed? This condition may be diagnosed based on your symptoms, medical history, and activities. You may also have tests and imaging, such as: X-rays. These allow your health care provider to check the position of the bones in your foot and look for damage to your joint. They also help your health care provider determine the severity of your bunion and the best way to treat it. Joint aspiration. In this test, a sample of fluid is removed from  the toe joint. This test may be done if you are in a lot of pain. It helps rule out diseases that cause painful swelling of the joints, such as arthritis or gout. How is this treated? Treatment depends on the severity of your symptoms. The goal of treatment is to relieve symptoms and prevent your bunion from getting worse. Your health care provider may recommend: Wearing shoes that have a wide toe box, or using bunion pads to cushion the affected area. Taping your toes together to keep them in a normal position. Placing a device inside your shoe (orthotic device) to help reduce pressure on your toe joint. Taking medicine to ease pain and inflammation. Putting ice or heat on the affected area. Doing stretching exercises. Surgery, for severe cases. Follow these instructions at home: Managing pain, stiffness, and swelling     If directed, put ice on the painful area. To do this: Put ice in a plastic bag. Place a towel between your skin and the bag. Leave the ice on for 20 minutes, 2-3 times a day. Remove the ice if your skin turns bright red. This is very important. If you cannot feel pain, heat, or cold, you have a greater risk of damage to the area. If directed, apply heat to the affected area before you exercise. Use the heat source that your health care provider recommends, such as a moist heat pack or a heating pad. Place a towel between your skin and the heat source. Leave the heat on for 20-30 minutes. Remove  the heat if your skin turns bright red. This is especially important if you are unable to feel pain, heat, or cold. You have a greater risk of getting burned. General instructions Do exercises as told by your health care provider. Support your toe joint with proper footwear, shoe padding, or taping as told by your health care provider. Take over-the-counter and prescription medicines only as told by your health care provider. Do not use any products that contain nicotine or  tobacco, such as cigarettes, e-cigarettes, and chewing tobacco. If you need help quitting, ask your health care provider. Keep all follow-up visits. This is important. Contact a health care provider if: Your symptoms get worse. Your symptoms do not improve in 2 weeks. Get help right away if: You have severe pain and trouble with walking. Summary A bunion is a bump on the inner side of the big toe joint that forms when the big toe turns toward the second toe. Bunions can make walking painful. Treatment depends on the severity of your symptoms. Support your toe joint with proper footwear, shoe padding, or taping as told by your health care provider. This information is not intended to replace advice given to you by your health care provider. Make sure you discuss any questions you have with your health care provider. Document Revised: 06/28/2019 Document Reviewed: 06/28/2019 Elsevier Patient Education  2023 Elsevier Inc.   Diabetes Mellitus and Foot Care Diabetes, also called diabetes mellitus, may cause problems with your feet and legs because of poor blood flow (circulation). Poor circulation may make your skin: Become thinner and drier. Break more easily. Heal more slowly. Peel and crack. You may also have nerve damage (neuropathy). This can cause decreased feeling in your legs and feet. This means that you may not notice minor injuries to your feet that could lead to more serious problems. Finding and treating problems early is the best way to prevent future foot problems. How to care for your feet Foot hygiene  Wash your feet daily with warm water and mild soap. Do not use hot water. Then, pat your feet and the areas between your toes until they are fully dry. Do not soak your feet. This can dry your skin. Trim your toenails straight across. Do not dig under them or around the cuticle. File the edges of your nails with an emery board or nail file. Apply a moisturizing lotion or  petroleum jelly to the skin on your feet and to dry, brittle toenails. Use lotion that does not contain alcohol and is unscented. Do not apply lotion between your toes. Shoes and socks Wear clean socks or stockings every day. Make sure they are not too tight. Do not wear knee-high stockings. These may decrease blood flow to your legs. Wear shoes that fit well and have enough cushioning. Always look in your shoes before you put them on to be sure there are no objects inside. To break in new shoes, wear them for just a few hours a day. This prevents injuries on your feet. Wounds, scrapes, corns, and calluses  Check your feet daily for blisters, cuts, bruises, sores, and redness. If you cannot see the bottom of your feet, use a mirror or ask someone for help. Do not cut off corns or calluses or try to remove them with medicine. If you find a minor scrape, cut, or break in the skin on your feet, keep it and the skin around it clean and dry. You may clean these areas with  mild soap and water. Do not clean the area with peroxide, alcohol, or iodine. If you have a wound, scrape, corn, or callus on your foot, look at it several times a day to make sure it is healing and not infected. Check for: Redness, swelling, or pain. Fluid or blood. Warmth. Pus or a bad smell. General tips Do not cross your legs. This may decrease blood flow to your feet. Do not use heating pads or hot water bottles on your feet. They may burn your skin. If you have lost feeling in your feet or legs, you may not know this is happening until it is too late. Protect your feet from hot and cold by wearing shoes, such as at the beach or on hot pavement. Schedule a complete foot exam at least once a year or more often if you have foot problems. Report any cuts, sores, or bruises to your health care provider right away. Where to find more information American Diabetes Association: diabetes.org Association of Diabetes Care & Education  Specialists: diabeteseducator.org Contact a health care provider if: You have a condition that increases your risk of infection, and you have any cuts, sores, or bruises on your feet. You have an injury that is not healing. You have redness on your legs or feet. You feel burning or tingling in your legs or feet. You have pain or cramps in your legs and feet. Your legs or feet are numb. Your feet always feel cold. You have pain around any toenails. Get help right away if: You have a wound, scrape, corn, or callus on your foot and: You have signs of infection. You have a fever. You have a red line going up your leg. This information is not intended to replace advice given to you by your health care provider. Make sure you discuss any questions you have with your health care provider. Document Revised: 08/25/2021 Document Reviewed: 08/25/2021 Elsevier Patient Education  2023 [employer].

## 2022-03-10 ENCOUNTER — Ambulatory Visit (INDEPENDENT_AMBULATORY_CARE_PROVIDER_SITE_OTHER): Payer: Medicare Other

## 2022-03-10 DIAGNOSIS — J4550 Severe persistent asthma, uncomplicated: Secondary | ICD-10-CM | POA: Diagnosis not present

## 2022-03-24 ENCOUNTER — Other Ambulatory Visit: Payer: Self-pay

## 2022-03-24 ENCOUNTER — Emergency Department (HOSPITAL_BASED_OUTPATIENT_CLINIC_OR_DEPARTMENT_OTHER)
Admission: EM | Admit: 2022-03-24 | Discharge: 2022-03-24 | Disposition: A | Discharge status: Home / Self Care | Payer: 59 | Attending: Emergency Medicine | Admitting: Emergency Medicine

## 2022-03-24 ENCOUNTER — Encounter (HOSPITAL_BASED_OUTPATIENT_CLINIC_OR_DEPARTMENT_OTHER): Payer: Self-pay

## 2022-03-24 ENCOUNTER — Emergency Department (HOSPITAL_BASED_OUTPATIENT_CLINIC_OR_DEPARTMENT_OTHER): Payer: 59 | Admitting: Radiology

## 2022-03-24 DIAGNOSIS — Z794 Long term (current) use of insulin: Secondary | ICD-10-CM | POA: Insufficient documentation

## 2022-03-24 DIAGNOSIS — M79641 Pain in right hand: Secondary | ICD-10-CM | POA: Diagnosis not present

## 2022-03-24 DIAGNOSIS — R079 Chest pain, unspecified: Secondary | ICD-10-CM | POA: Insufficient documentation

## 2022-03-24 DIAGNOSIS — Y92410 Unspecified street and highway as the place of occurrence of the external cause: Secondary | ICD-10-CM | POA: Insufficient documentation

## 2022-03-24 DIAGNOSIS — I10 Essential (primary) hypertension: Secondary | ICD-10-CM | POA: Diagnosis not present

## 2022-03-24 DIAGNOSIS — R0781 Pleurodynia: Secondary | ICD-10-CM | POA: Diagnosis not present

## 2022-03-24 DIAGNOSIS — R0789 Other chest pain: Secondary | ICD-10-CM | POA: Diagnosis not present

## 2022-03-24 DIAGNOSIS — E119 Type 2 diabetes mellitus without complications: Secondary | ICD-10-CM | POA: Diagnosis not present

## 2022-03-24 DIAGNOSIS — Z79899 Other long term (current) drug therapy: Secondary | ICD-10-CM | POA: Diagnosis not present

## 2022-03-24 DIAGNOSIS — M1811 Unilateral primary osteoarthritis of first carpometacarpal joint, right hand: Secondary | ICD-10-CM | POA: Diagnosis not present

## 2022-03-24 DIAGNOSIS — J45909 Unspecified asthma, uncomplicated: Secondary | ICD-10-CM | POA: Diagnosis not present

## 2022-03-24 MED ORDER — ONDANSETRON 4 MG PO TBDP: 4.0000 mg | ORAL_TABLET | Freq: Once | ORAL | Status: DC
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
## 2022-03-24 NOTE — ED Notes (Signed)
Pt verbalized understanding of d/c instructions, meds, and followup care. Denies questions. VSS, no distress noted. Steady gait to exit with all belongings.  ?
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
## 2022-03-31 ENCOUNTER — Ambulatory Visit: Payer: Medicare Other | Admitting: Allergy

## 2022-04-06 DIAGNOSIS — M25511 Pain in right shoulder: Secondary | ICD-10-CM | POA: Diagnosis not present

## 2022-04-06 DIAGNOSIS — M542 Cervicalgia: Secondary | ICD-10-CM | POA: Diagnosis not present

## 2022-04-11 ENCOUNTER — Other Ambulatory Visit: Payer: Self-pay | Admitting: Allergy & Immunology

## 2022-04-18 ENCOUNTER — Ambulatory Visit: Payer: 59 | Admitting: Physician Assistant

## 2022-05-03 ENCOUNTER — Ambulatory Visit: Payer: 59

## 2022-05-05 ENCOUNTER — Ambulatory Visit (INDEPENDENT_AMBULATORY_CARE_PROVIDER_SITE_OTHER): Payer: 59 | Admitting: Orthopaedic Surgery

## 2022-05-05 ENCOUNTER — Ambulatory Visit: Payer: 59 | Admitting: Physician Assistant

## 2022-05-05 ENCOUNTER — Encounter: Payer: Self-pay | Admitting: Orthopaedic Surgery

## 2022-05-05 DIAGNOSIS — S134XXD Sprain of ligaments of cervical spine, subsequent encounter: Secondary | ICD-10-CM
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
## 2022-05-06 ENCOUNTER — Other Ambulatory Visit: Payer: Self-pay

## 2022-05-06 DIAGNOSIS — S134XXD Sprain of ligaments of cervical spine, subsequent encounter: Secondary | ICD-10-CM

## 2022-05-10 DIAGNOSIS — R0981 Nasal congestion: Secondary | ICD-10-CM | POA: Diagnosis not present

## 2022-05-10 DIAGNOSIS — Z20822 Contact with and (suspected) exposure to covid-19: Secondary | ICD-10-CM | POA: Diagnosis not present

## 2022-05-12 ENCOUNTER — Encounter: Payer: Self-pay | Admitting: Radiology

## 2022-05-16 ENCOUNTER — Ambulatory Visit: Payer: Medicare Other | Admitting: Podiatry
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
## 2022-05-24 ENCOUNTER — Ambulatory Visit: Payer: 59 | Attending: Orthopaedic Surgery

## 2022-05-24 ENCOUNTER — Other Ambulatory Visit: Payer: Self-pay

## 2022-05-24 DIAGNOSIS — M542 Cervicalgia: Secondary | ICD-10-CM | POA: Insufficient documentation

## 2022-05-24 DIAGNOSIS — M25511 Pain in right shoulder: Secondary | ICD-10-CM | POA: Diagnosis not present

## 2022-05-24 DIAGNOSIS — M5459 Other low back pain: Secondary | ICD-10-CM | POA: Diagnosis not present

## 2022-05-24 DIAGNOSIS — M6281 Muscle weakness (generalized): Secondary | ICD-10-CM | POA: Diagnosis not present

## 2022-05-24 DIAGNOSIS — S134XXD Sprain of ligaments of cervical spine, subsequent encounter: Secondary | ICD-10-CM | POA: Diagnosis not present

## 2022-05-30 ENCOUNTER — Ambulatory Visit (INDEPENDENT_AMBULATORY_CARE_PROVIDER_SITE_OTHER): Payer: 59 | Admitting: Orthopaedic Surgery

## 2022-05-30 ENCOUNTER — Encounter: Payer: Self-pay | Admitting: Orthopaedic Surgery

## 2022-05-30 DIAGNOSIS — S134XXD Sprain of ligaments of cervical spine, subsequent encounter: Secondary | ICD-10-CM | POA: Diagnosis not present
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
## 2022-05-31 DIAGNOSIS — E782 Mixed hyperlipidemia: Secondary | ICD-10-CM | POA: Diagnosis not present

## 2022-05-31 DIAGNOSIS — I10 Essential (primary) hypertension: Secondary | ICD-10-CM | POA: Diagnosis not present

## 2022-05-31 DIAGNOSIS — E1122 Type 2 diabetes mellitus with diabetic chronic kidney disease: Secondary | ICD-10-CM | POA: Diagnosis not present

## 2022-05-31 DIAGNOSIS — N1831 Chronic kidney disease, stage 3a: Secondary | ICD-10-CM | POA: Diagnosis not present

## 2022-06-02 ENCOUNTER — Ambulatory Visit: Payer: Medicare Other | Admitting: Allergy & Immunology

## 2022-06-02 ENCOUNTER — Ambulatory Visit: Payer: 59 | Admitting: Orthopaedic Surgery

## 2022-06-07 ENCOUNTER — Ambulatory Visit: Payer: Medicare Other | Admitting: Allergy & Immunology

## 2022-06-09 ENCOUNTER — Ambulatory Visit: Payer: 59

## 2022-06-20 ENCOUNTER — Encounter: Payer: Self-pay | Admitting: Physical Therapy

## 2022-06-20 ENCOUNTER — Other Ambulatory Visit: Payer: Self-pay

## 2022-06-20 ENCOUNTER — Ambulatory Visit: Payer: 59 | Attending: Orthopaedic Surgery | Admitting: Physical Therapy

## 2022-06-20 DIAGNOSIS — M6281 Muscle weakness (generalized): Secondary | ICD-10-CM | POA: Insufficient documentation

## 2022-06-20 DIAGNOSIS — M25511 Pain in right shoulder: Secondary | ICD-10-CM | POA: Insufficient documentation

## 2022-06-20 DIAGNOSIS — M542 Cervicalgia: Secondary | ICD-10-CM | POA: Insufficient documentation

## 2022-06-20 DIAGNOSIS — M5459 Other low back pain: Secondary | ICD-10-CM | POA: Diagnosis not present

## 2022-06-20 NOTE — Patient Instructions (Signed)
Access Code: [email or web address] URL: [email or web address] Date: 06/20/2022 Prepared by: Rosana Hoes  Exercises - Supine Lower Trunk Rotation  - 1 x daily - 7 x weekly - 2 sets - 10 reps - Hooklying Single Knee to Chest Stretch  - 1 x daily - 7 x weekly - 3 reps - 20 hold - Supine Figure 4 Piriformis Stretch  - 1 x daily - 7 x weekly - 3 sets - 20  hold - Supine Piriformis Stretch with Foot on Ground  - 1 x daily - 7 x weekly - 3 reps - 20 hold - Sidelying Thoracic Rotation with Open Book  - 1 x daily - 7 x weekly - 1 sets - 10 reps - Seated Cervical Sidebending Stretch  - 1 x daily - 7 x weekly - 3 reps - 20 hold

## 2022-06-20 NOTE — Therapy (Signed)
OUTPATIENT PHYSICAL THERAPY TREATMENT NOTE   Patient Name: Melissa Chavez MRN: [id number] DOB:12-29-1960, 62 y.o., female Today's Date: 06/20/2022  PCP: Georgianne Fick, MD REFERRING PROVIDER: Kathryne Hitch, MD    END OF SESSION:   PT End of Session - 06/20/22 1626     Visit Number 2    Number of Visits 17    Date for PT Re-Evaluation 07/23/22    Authorization Type MCR/MCD    Progress Note Due on Visit 10    PT Start Time 1615    PT Stop Time 1655    PT Time Calculation (min) 40 min    Activity Tolerance Patient tolerated treatment well    Behavior During Therapy WFL for tasks assessed/performed             Past Medical History:  Diagnosis Date   Allergy    Anemia    Asthma    Bipolar 2 disorder    Diabetes mellitus without complication    GERD (gastroesophageal reflux disease)    Glaucoma    Pt denies   Hernia    Hypertension    Past Surgical History:  Procedure Laterality Date   deviated septum surgery x 2      HERNIA REPAIR     TONSILLECTOMY     Pt denies   TUBAL LIGATION     Patient Active Problem List   Diagnosis Date Noted   Primary osteoarthritis involving multiple joints 02/07/2022   Chronic kidney disease 01/18/2021   Chronic kidney disease, stage 3a 01/18/2021   DDD (degenerative disc disease), cervical 01/18/2021   Diabetic renal disease 01/18/2021   Idiopathic gout 01/18/2021   Long term (current) use of insulin 01/18/2021   Hyperlipidemia, unspecified 01/18/2021   Morbid obesity 01/18/2021   Unilateral primary osteoarthritis, left knee 09/13/2016   Unilateral primary osteoarthritis, right knee 09/13/2016   Pressure injury of skin 03/08/2016   Acute gout of left knee 03/07/2016   Sinus congestion 11/29/2015   Bilateral leg edema 09/25/2015   Abdominal pain, left upper quadrant 09/25/2015   DJD (degenerative joint disease) of upper arm 09/07/2015   Prednisone adverse reaction 08/20/2015   Abdominal pain, chronic,  epigastric 06/30/2015   Osteoarthritis of right foot 03/22/2015   Type 2 diabetes mellitus with diabetic polyneuropathy, without long-term current use of insulin 03/20/2015   BMI 40.0-44.9, adult 06/13/2013   Overactive bladder 06/13/2013   Allergic rhinitis 06/13/2013   Asthma, moderate persistent 06/13/2013   BIPOLAR DISORDER UNSPECIFIED 03/18/2008   PERIPHERAL EDEMA 07/03/2007   GERD 01/30/2007   HYPERCHOLESTEROLEMIA 12/07/2006   Essential hypertension 12/07/2006   Osteoarthritis of right knee 12/07/2006   ANEMIA, IRON DEFICIENCY 01/06/2006    REFERRING DIAG: Whiplash injury to neck, subsequent encounter   THERAPY DIAG:  Cervicalgia  Acute pain of right shoulder  Other low back pain  Muscle weakness (generalized)  Rationale for Evaluation and Treatment Rehabilitation  PERTINENT HISTORY: See PMH above  PRECAUTIONS: None   SUBJECTIVE:  SUBJECTIVE STATEMENT:  Patient reports continued aches and pains primarily in the right shoulder that escalate to the back.  PAIN:  Are you having pain? Yes:  NPRS scale: 6 /10 Pain location: low/mid back and Rt anterior shoulder Pain description: tight;sore Aggravating factors: reaching/lifting overhead, prolonged sitting Relieving factors: heat   OBJECTIVE: (objective measures completed at initial evaluation unless otherwise dated) PATIENT SURVEYS:  FOTO 46% function to 56% predicted    POSTURE:  Rounded shoulders and forward head   PALPATION: Diffuse tenderness about Rt shoulder and thoracolumbar paraspinals            CERVICAL ROM:    Active ROM A/PROM (deg) eval   06/20/2022  Flexion WNL   Extension WNL   Right lateral flexion WNL   Left lateral flexion WNL   Right rotation 42* 55  Left rotation 64 65   (Blank rows = not tested) patient  reports "tenderness" with all cervical AROM *concordant pain    UPPER EXTREMITY ROM:   Active ROM Right eval Left eval Rt / Lt 06/20/22  Shoulder flexion 140   170 / 170  Shoulder extension       Shoulder abduction WNL     Shoulder adduction       Shoulder extension       Shoulder internal rotation T10  T10    Shoulder external rotation C7 T2   Elbow flexion       Elbow extension       Wrist flexion       Wrist extension       Wrist ulnar deviation       Wrist radial deviation       Wrist pronation       Wrist supination        (Blank rows = not tested)   UPPER EXTREMITY MMT:   MMT Right eval Left eval  Shoulder flexion 4 4  Shoulder extension      Shoulder abduction 5 5  Shoulder adduction      Shoulder extension      Shoulder internal rotation 4+ 4+  Shoulder external rotation 4+ 4+  Middle trapezius      Lower trapezius      Elbow flexion      Elbow extension      Wrist flexion      Wrist extension      Wrist ulnar deviation      Wrist radial deviation      Wrist pronation      Wrist supination      Grip strength       (Blank rows = not tested)  LUMBAR ROM:    Active  A/PROM  eval  Flexion WNL pn  Extension WNL pn   Right lateral flexion 25% limited pn  Left lateral flexion 25% limited pn   Right rotation WNL pn   Left rotation WNL pn   (Blank rows = not tested)  LOWER EXTREMITY MMT:     MMT Right eval Left eval  Hip flexion 4+ 4+  Hip extension      Hip abduction 4- 4-  Hip adduction      Hip internal rotation      Hip external rotation      Knee flexion      Knee extension      Ankle dorsiflexion      Ankle plantarflexion      Ankle inversion      Ankle eversion       (  Blank rows = not tested)  LOWER EXTREMITY ROM:      Active  Right eval Left eval  Hip flexion 95 mid back pain  95 mid back pain   Hip extension      Hip abduction      Hip adduction      Hip internal rotation      Hip external rotation      Knee flexion       Knee extension      Ankle dorsiflexion      Ankle plantarflexion      Ankle inversion      Ankle eversion       (Blank rows = not tested)     TODAY'S TREATMENT: [facility] Adult PT Treatment:                                                DATE: 06/20/22 Therapeutic Exercise: NuStep L6 x 5 min with UE/LE while taking subjective LTR x 10 SKTC 3 x 15 sec each Figure-4 stretch push/pul 2 x 15 sec each Sidelying thoracic rotation x 10 each Seated upper trap stretch 3 x 15 sec each Seated physioball roll out x 10 Row with blue 2 x 15 Extension with red 2 x 10 Supine horizontal abduction with red 2 x 15 Supine clamshell with red 2 x 15 Bridge 2 x 10    PATIENT EDUCATION:  Education details: HEP Person educated: Patient Education method: Programmer, multimedia, Facilities manager, Actor cues, Verbal cues Education comprehension: verbalized understanding, returned demonstration, verbal cues required, tactile cues required, and needs further education   HOME EXERCISE PROGRAM: Access Code: [email or web address]    ASSESSMENT: CLINICAL IMPRESSION: Patient tolerated therapy well with no adverse effects. She demonstrates improved right shoulder flexion and cervical rotation this visit. Therapy continues to focus on progressing mobility and initiated some postural and core strengthening with good tolerance. She does require consistent cueing for proper exercise technique. Updated HEP.  Patient would benefit from continued skilled PT to progress her mobility and strength in order to reduce pain and maximize functional ability.     OBJECTIVE IMPAIRMENTS: decreased activity tolerance, decreased endurance, decreased knowledge of condition, difficulty walking, decreased ROM, decreased strength, increased fascial restrictions, impaired flexibility, impaired UE functional use, improper body mechanics, postural dysfunction, and pain.    ACTIVITY LIMITATIONS: carrying, lifting, bending, sitting, standing, squatting, reach over  head, and locomotion level   PARTICIPATION LIMITATIONS: meal prep, cleaning, laundry, driving, shopping, and community activity   PERSONAL FACTORS: Age, Fitness, Time since onset of injury/illness/exacerbation, and 3+ comorbidities: see PMH above  are also affecting patient's functional outcome.      GOALS: Goals reviewed with patient? Yes   SHORT TERM GOALS: Target date: 06/21/2022   Patient will be independent and compliant with initial HEP.  Baseline: issued at eval  06/20/2022: progressing Goal status: ONGOING   2.  Patient will demonstrate at least 60 degrees of Rt cervical rotation AROM to improve ability to complete head turns while driving.  Baseline: see above  06/20/2022: 55 deg Goal status: ONGOING   3.  Patient will demonstrate at least 160 degrees of Rt shoulder flexion AROM to improve ability to reach into overhead cabinets.  Baseline: see above  06/20/2022: 170 deg Goal status: MET  LONG TERM GOALS: Target date: 07/23/2022  Patient will score at least 56% on FOTO  to signify clinically meaningful improvement in functional abilities.  Baseline: see above Goal status: INITIAL   2.  Patient will be able to lift and lower at least 3# into overhead cabinet with minimal pain.  Baseline: see subjective  Goal status: INITIAL   3.  Patient will demonstrate 4+/5 bilateral hip abductor strength to improve stability about the chain with prolonged walking/standing activity.  Baseline: see above  Goal status: INITIAL   4.  Patient will report pain at worst rated as </= 5/10 to reduce current functional limitations.  Baseline: see subjective  Goal status: INITIAL   5.  Patient will be independent with advanced home program to progress/maintain current level of function.  Baseline: initial HEP issued  Goal status: INITIAL     PLAN: PT FREQUENCY: 1-2x/week   PT DURATION: 8 weeks   PLANNED INTERVENTIONS: Therapeutic exercises, Therapeutic activity, Neuromuscular  re-education, Balance training, Gait training, Patient/Family education, Self Care, Joint mobilization, Aquatic Therapy, Dry Needling, Cryotherapy, Moist heat, Manual therapy, and Re-evaluation   PLAN FOR NEXT SESSION: review and progress HEP prn; trunk and shoulder mobility, hip stretching, core/hip/shoulder strengthening as tolerated.    Rosana Hoes, PT, DPT, LAT, ATC 06/20/22  4:59 PM Phone: [phone] Fax: [phone]

## 2022-06-21 NOTE — Therapy (Signed)
OUTPATIENT PHYSICAL THERAPY TREATMENT NOTE   Patient Name: Melissa Chavez MRN: [id number] DOB:[birth year], 62 y.o., female Today's Date: 06/22/2022  PCP: Georgianne Fick, MD REFERRING PROVIDER: Kathryne Hitch, MD    END OF SESSION:   PT End of Session - 06/22/22 1613     Visit Number 3    Number of Visits 17    Date for PT Re-Evaluation 07/23/22    Authorization Type MCR/MCD    Progress Note Due on Visit 10    PT Start Time 1610    PT Stop Time 1650    PT Time Calculation (min) 40 min    Activity Tolerance Patient tolerated treatment well    Behavior During Therapy WFL for tasks assessed/performed              Past Medical History:  Diagnosis Date   Allergy    Anemia    Asthma    Bipolar 2 disorder    Diabetes mellitus without complication    GERD (gastroesophageal reflux disease)    Glaucoma    Pt denies   Hernia    Hypertension    Past Surgical History:  Procedure Laterality Date   deviated septum surgery x 2      HERNIA REPAIR     TONSILLECTOMY     Pt denies   TUBAL LIGATION     Patient Active Problem List   Diagnosis Date Noted   Primary osteoarthritis involving multiple joints 02/07/2022   Chronic kidney disease 01/18/2021   Chronic kidney disease, stage 3a 01/18/2021   DDD (degenerative disc disease), cervical 01/18/2021   Diabetic renal disease 01/18/2021   Idiopathic gout 01/18/2021   Long term (current) use of insulin 01/18/2021   Hyperlipidemia, unspecified 01/18/2021   Morbid obesity 01/18/2021   Unilateral primary osteoarthritis, left knee 09/13/2016   Unilateral primary osteoarthritis, right knee 09/13/2016   Pressure injury of skin 03/08/2016   Acute gout of left knee 03/07/2016   Sinus congestion 11/29/2015   Bilateral leg edema 09/25/2015   Abdominal pain, left upper quadrant 09/25/2015   DJD (degenerative joint disease) of upper arm 09/07/2015   Prednisone adverse reaction 08/20/2015   Abdominal pain, chronic,  epigastric 06/30/2015   Osteoarthritis of right foot 03/22/2015   Type 2 diabetes mellitus with diabetic polyneuropathy, without long-term current use of insulin 03/20/2015   BMI 40.0-44.9, adult 06/13/2013   Overactive bladder 06/13/2013   Allergic rhinitis 06/13/2013   Asthma, moderate persistent 06/13/2013   BIPOLAR DISORDER UNSPECIFIED 03/18/2008   PERIPHERAL EDEMA 07/03/2007   GERD 01/30/2007   HYPERCHOLESTEROLEMIA 12/07/2006   Essential hypertension 12/07/2006   Osteoarthritis of right knee 12/07/2006   ANEMIA, IRON DEFICIENCY 01/06/2006    REFERRING DIAG: Whiplash injury to neck, subsequent encounter   THERAPY DIAG:  Cervicalgia  Acute pain of right shoulder  Other low back pain  Muscle weakness (generalized)  Rationale for Evaluation and Treatment Rehabilitation  PERTINENT HISTORY: See PMH above  PRECAUTIONS: None   SUBJECTIVE:  SUBJECTIVE STATEMENT:  Patient reports she was a little sore following last visit but she could tell it was helpful.  PAIN:  Are you having pain? Yes:  NPRS scale: 6 /10 Pain location: low/mid back and Rt anterior shoulder Pain description: tight;sore Aggravating factors: reaching/lifting overhead, prolonged sitting Relieving factors: heat   OBJECTIVE: (objective measures completed at initial evaluation unless otherwise dated) PATIENT SURVEYS:  FOTO 46% function to 56% predicted    POSTURE:  Rounded shoulders and forward head   PALPATION: Diffuse tenderness about Rt shoulder and thoracolumbar paraspinals            CERVICAL ROM:    Active ROM A/PROM (deg) eval   06/20/2022  Flexion WNL   Extension WNL   Right lateral flexion WNL   Left lateral flexion WNL   Right rotation 42* 55  Left rotation 64 65   (Blank rows = not tested) patient reports  "tenderness" with all cervical AROM *concordant pain    UPPER EXTREMITY ROM:   Active ROM Right eval Left eval Rt / Lt 06/20/22  Shoulder flexion 140   170 / 170  Shoulder extension       Shoulder abduction WNL     Shoulder adduction       Shoulder extension       Shoulder internal rotation T10  T10    Shoulder external rotation C7 T2   Elbow flexion       Elbow extension       Wrist flexion       Wrist extension       Wrist ulnar deviation       Wrist radial deviation       Wrist pronation       Wrist supination        (Blank rows = not tested)   UPPER EXTREMITY MMT:   MMT Right eval Left eval  Shoulder flexion 4 4  Shoulder extension      Shoulder abduction 5 5  Shoulder adduction      Shoulder extension      Shoulder internal rotation 4+ 4+  Shoulder external rotation 4+ 4+  Middle trapezius      Lower trapezius      Elbow flexion      Elbow extension      Wrist flexion      Wrist extension      Wrist ulnar deviation      Wrist radial deviation      Wrist pronation      Wrist supination      Grip strength       (Blank rows = not tested)  LUMBAR ROM:    Active  A/PROM  eval  Flexion WNL pn  Extension WNL pn   Right lateral flexion 25% limited pn  Left lateral flexion 25% limited pn   Right rotation WNL pn   Left rotation WNL pn   (Blank rows = not tested)  LOWER EXTREMITY MMT:     MMT Right eval Left eval  Hip flexion 4+ 4+  Hip extension      Hip abduction 4- 4-  Hip adduction      Hip internal rotation      Hip external rotation      Knee flexion      Knee extension      Ankle dorsiflexion      Ankle plantarflexion      Ankle inversion      Ankle eversion       (  Blank rows = not tested)  LOWER EXTREMITY ROM:      Active  Right eval Left eval  Hip flexion 95 mid back pain  95 mid back pain   Hip extension      Hip abduction      Hip adduction      Hip internal rotation      Hip external rotation      Knee flexion       Knee extension      Ankle dorsiflexion      Ankle plantarflexion      Ankle inversion      Ankle eversion       (Blank rows = not tested)     TODAY'S TREATMENT: [facility] Adult PT Treatment:                                                DATE: 06/22/22 Therapeutic Exercise: NuStep L6 x 5 min with UE/LE while taking subjective Seated hamstring stretch 2 x 20 sec LTR x 10 SKTC 3 x 15 sec each Figure-4 stretch push/pull 2 x 15 sec each Bridge 2 x 10 Supine clamshell with red 2 x 15 Side clamshell with red 2 x 10 each Sidelying thoracic rotation x 10 each Seated horizontal abduction with red 2 x 10 Seated upper trap stretch 2 x 20 sec each Seated physioball roll out x 10 Row with blue 2 x 15 Extension with red 2 x 15 Sit to stand holding blue med ball with overhead press 2 x 10   [facility] Adult PT Treatment:                                                DATE: 06/20/22 Therapeutic Exercise: NuStep L6 x 5 min with UE/LE while taking subjective LTR x 10 SKTC 3 x 15 sec each Figure-4 stretch push/pul 2 x 15 sec each Sidelying thoracic rotation x 10 each Seated upper trap stretch 3 x 15 sec each Seated physioball roll out x 10 Row with blue 2 x 15 Extension with red 2 x 10 Supine horizontal abduction with red 2 x 15 Supine clamshell with red 2 x 15 Bridge 2 x 10  PATIENT EDUCATION:  Education details: HEP Person educated: Patient Education method: Programmer, multimedia, Facilities manager, Actor cues, Verbal cues Education comprehension: verbalized understanding, returned demonstration, verbal cues required, tactile cues required, and needs further education   HOME EXERCISE PROGRAM: Access Code: [email or web address]    ASSESSMENT: CLINICAL IMPRESSION: Patient tolerated therapy well with no adverse effects. Therapy continues to focus primarily on progressing hip, core, postural, and shoulder strengthening with good tolerance. She is tolerating progressions in strengthening well and reports improvement  with exercises. She does require cueing for posture throughout session. No changes made to HEP this visit. Patient would benefit from continued skilled PT to progress her mobility and strength in order to reduce pain and maximize functional ability.     OBJECTIVE IMPAIRMENTS: decreased activity tolerance, decreased endurance, decreased knowledge of condition, difficulty walking, decreased ROM, decreased strength, increased fascial restrictions, impaired flexibility, impaired UE functional use, improper body mechanics, postural dysfunction, and pain.    ACTIVITY LIMITATIONS: carrying, lifting, bending, sitting, standing, squatting, reach over head, and locomotion level  PARTICIPATION LIMITATIONS: meal prep, cleaning, laundry, driving, shopping, and community activity   PERSONAL FACTORS: Age, Fitness, Time since onset of injury/illness/exacerbation, and 3+ comorbidities: see PMH above  are also affecting patient's functional outcome.      GOALS: Goals reviewed with patient? Yes   SHORT TERM GOALS: Target date: 06/21/2022   Patient will be independent and compliant with initial HEP.  Baseline: issued at eval  06/20/2022: progressing Goal status: ONGOING   2.  Patient will demonstrate at least 60 degrees of Rt cervical rotation AROM to improve ability to complete head turns while driving.  Baseline: see above  06/20/2022: 55 deg Goal status: ONGOING   3.  Patient will demonstrate at least 160 degrees of Rt shoulder flexion AROM to improve ability to reach into overhead cabinets.  Baseline: see above  06/20/2022: 170 deg Goal status: MET  LONG TERM GOALS: Target date: 07/23/2022  Patient will score at least 56% on FOTO to signify clinically meaningful improvement in functional abilities.  Baseline: see above Goal status: INITIAL   2.  Patient will be able to lift and lower at least 3# into overhead cabinet with minimal pain.  Baseline: see subjective  Goal status: INITIAL   3.   Patient will demonstrate 4+/5 bilateral hip abductor strength to improve stability about the chain with prolonged walking/standing activity.  Baseline: see above  Goal status: INITIAL   4.  Patient will report pain at worst rated as </= 5/10 to reduce current functional limitations.  Baseline: see subjective  Goal status: INITIAL   5.  Patient will be independent with advanced home program to progress/maintain current level of function.  Baseline: initial HEP issued  Goal status: INITIAL     PLAN: PT FREQUENCY: 1-2x/week   PT DURATION: 8 weeks   PLANNED INTERVENTIONS: Therapeutic exercises, Therapeutic activity, Neuromuscular re-education, Balance training, Gait training, Patient/Family education, Self Care, Joint mobilization, Aquatic Therapy, Dry Needling, Cryotherapy, Moist heat, Manual therapy, and Re-evaluation   PLAN FOR NEXT SESSION: review and progress HEP prn; trunk and shoulder mobility, hip stretching, core/hip/shoulder strengthening as tolerated.    Rosana Hoes, PT, DPT, LAT, ATC 06/22/22  4:56 PM Phone: [phone] Fax: [phone]

## 2022-06-22 ENCOUNTER — Other Ambulatory Visit: Payer: Self-pay

## 2022-06-22 ENCOUNTER — Encounter: Payer: Self-pay | Admitting: Physical Therapy

## 2022-06-22 ENCOUNTER — Ambulatory Visit: Payer: 59 | Admitting: Physical Therapy

## 2022-06-22 DIAGNOSIS — M542 Cervicalgia: Secondary | ICD-10-CM | POA: Diagnosis not present

## 2022-06-22 DIAGNOSIS — M25511 Pain in right shoulder: Secondary | ICD-10-CM | POA: Diagnosis not present

## 2022-06-22 DIAGNOSIS — M5459 Other low back pain: Secondary | ICD-10-CM

## 2022-06-22 DIAGNOSIS — M6281 Muscle weakness (generalized): Secondary | ICD-10-CM

## 2022-06-24 NOTE — Therapy (Signed)
OUTPATIENT PHYSICAL THERAPY TREATMENT NOTE   Patient Name: Melissa Chavez MRN: [id number] DOB:[birth year], 62 y.o., female Today's Date: 06/27/2022  PCP: Georgianne Fick, MD REFERRING PROVIDER: Kathryne Hitch, MD    END OF SESSION:   PT End of Session - 06/27/22 1618     Visit Number 4    Number of Visits 17    Date for PT Re-Evaluation 07/23/22    Authorization Type MCR/MCD    Progress Note Due on Visit 10    PT Start Time 1615    PT Stop Time 1655    PT Time Calculation (min) 40 min    Activity Tolerance Patient tolerated treatment well    Behavior During Therapy WFL for tasks assessed/performed               Past Medical History:  Diagnosis Date   Allergy    Anemia    Asthma    Bipolar 2 disorder    Diabetes mellitus without complication    GERD (gastroesophageal reflux disease)    Glaucoma    Pt denies   Hernia    Hypertension    Past Surgical History:  Procedure Laterality Date   deviated septum surgery x 2      HERNIA REPAIR     TONSILLECTOMY     Pt denies   TUBAL LIGATION     Patient Active Problem List   Diagnosis Date Noted   Primary osteoarthritis involving multiple joints 02/07/2022   Chronic kidney disease 01/18/2021   Chronic kidney disease, stage 3a 01/18/2021   DDD (degenerative disc disease), cervical 01/18/2021   Diabetic renal disease 01/18/2021   Idiopathic gout 01/18/2021   Long term (current) use of insulin 01/18/2021   Hyperlipidemia, unspecified 01/18/2021   Morbid obesity 01/18/2021   Unilateral primary osteoarthritis, left knee 09/13/2016   Unilateral primary osteoarthritis, right knee 09/13/2016   Pressure injury of skin 03/08/2016   Acute gout of left knee 03/07/2016   Sinus congestion 11/29/2015   Bilateral leg edema 09/25/2015   Abdominal pain, left upper quadrant 09/25/2015   DJD (degenerative joint disease) of upper arm 09/07/2015   Prednisone adverse reaction 08/20/2015   Abdominal pain, chronic,  epigastric 06/30/2015   Osteoarthritis of right foot 03/22/2015   Type 2 diabetes mellitus with diabetic polyneuropathy, without long-term current use of insulin 03/20/2015   BMI 40.0-44.9, adult 06/13/2013   Overactive bladder 06/13/2013   Allergic rhinitis 06/13/2013   Asthma, moderate persistent 06/13/2013   BIPOLAR DISORDER UNSPECIFIED 03/18/2008   PERIPHERAL EDEMA 07/03/2007   GERD 01/30/2007   HYPERCHOLESTEROLEMIA 12/07/2006   Essential hypertension 12/07/2006   Osteoarthritis of right knee 12/07/2006   ANEMIA, IRON DEFICIENCY 01/06/2006    REFERRING DIAG: Whiplash injury to neck, subsequent encounter   THERAPY DIAG:  Cervicalgia  Acute pain of right shoulder  Other low back pain  Muscle weakness (generalized)  Rationale for Evaluation and Treatment Rehabilitation  PERTINENT HISTORY: See PMH above  PRECAUTIONS: None   SUBJECTIVE:  SUBJECTIVE STATEMENT:  Patient reports she is doing well. No new issues.  PAIN:  Are you having pain? Yes:  NPRS scale: 6 /10 Pain location: low/mid back and Rt anterior shoulder Pain description: tight;sore Aggravating factors: reaching/lifting overhead, prolonged sitting Relieving factors: heat   OBJECTIVE: (objective measures completed at initial evaluation unless otherwise dated) PATIENT SURVEYS:  FOTO 46% function to 56% predicted    POSTURE:  Rounded shoulders and forward head   PALPATION: Diffuse tenderness about Rt shoulder and thoracolumbar paraspinals            CERVICAL ROM:    Active ROM A/PROM (deg) eval   06/20/2022  Flexion WNL   Extension WNL   Right lateral flexion WNL   Left lateral flexion WNL   Right rotation 42* 55  Left rotation 64 65   (Blank rows = not tested) patient reports "tenderness" with all cervical  AROM *concordant pain    UPPER EXTREMITY ROM:   Active ROM Right eval Left eval Rt / Lt 06/20/22  Shoulder flexion 140   170 / 170  Shoulder extension       Shoulder abduction WNL     Shoulder adduction       Shoulder extension       Shoulder internal rotation T10  T10    Shoulder external rotation C7 T2   Elbow flexion       Elbow extension       Wrist flexion       Wrist extension       Wrist ulnar deviation       Wrist radial deviation       Wrist pronation       Wrist supination        (Blank rows = not tested)   UPPER EXTREMITY MMT:   MMT Right eval Left eval  Shoulder flexion 4 4  Shoulder extension      Shoulder abduction 5 5  Shoulder adduction      Shoulder extension      Shoulder internal rotation 4+ 4+  Shoulder external rotation 4+ 4+  Middle trapezius      Lower trapezius      Elbow flexion      Elbow extension      Wrist flexion      Wrist extension      Wrist ulnar deviation      Wrist radial deviation      Wrist pronation      Wrist supination      Grip strength       (Blank rows = not tested)  LUMBAR ROM:    Active  A/PROM  eval  06/27/2022  Flexion WNL pn WFL  Extension WNL pn  WFL  Right lateral flexion 25% limited pn WFL  Left lateral flexion 25% limited pn  WFL  Right rotation WNL pn  WFL  Left rotation WNL pn WFL   (Blank rows = not tested)  LOWER EXTREMITY MMT:     MMT Right eval Left eval  Hip flexion 4+ 4+  Hip extension      Hip abduction 4- 4-  Hip adduction      Hip internal rotation      Hip external rotation      Knee flexion      Knee extension      Ankle dorsiflexion      Ankle plantarflexion      Ankle inversion      Ankle eversion       (  Blank rows = not tested)  LOWER EXTREMITY ROM:      Active  Right eval Left eval  Hip flexion 95 mid back pain  95 mid back pain   Hip extension      Hip abduction      Hip adduction      Hip internal rotation      Hip external rotation      Knee flexion       Knee extension      Ankle dorsiflexion      Ankle plantarflexion      Ankle inversion      Ankle eversion       (Blank rows = not tested)     TODAY'S TREATMENT: [facility] Adult PT Treatment:                                                DATE: 06/27/22 Therapeutic Exercise: NuStep L6 x 5 min with UE/LE while taking subjective Seated hamstring stretch 2 x 20 sec LTR in figure-4 position x 5 each Figure-4 stretch push/pull 2 x 15 sec each Bridge 2 x 10 SLR 2 x 10 Sidelying hip abduction 2 x 10 each Sidelying thoracic rotation x 10 each Sit to stand holding yellow med ball with overhead reach 2 x 10 Row with blue 2 x 15 Extension with green 2 x 15   [facility] Adult PT Treatment:                                                DATE: 06/22/22 Therapeutic Exercise: NuStep L6 x 5 min with UE/LE while taking subjective Seated hamstring stretch 2 x 20 sec LTR x 10 SKTC 3 x 15 sec each Figure-4 stretch push/pull 2 x 15 sec each Bridge 2 x 10 Supine clamshell with red 2 x 15 Side clamshell with red 2 x 10 each Sidelying thoracic rotation x 10 each Seated horizontal abduction with red 2 x 10 Seated upper trap stretch 2 x 20 sec each Seated physioball roll out x 10 Row with blue 2 x 15 Extension with red 2 x 15 Sit to stand holding blue med ball with overhead press 2 x 10  [facility] Adult PT Treatment:                                                DATE: 06/20/22 Therapeutic Exercise: NuStep L6 x 5 min with UE/LE while taking subjective LTR x 10 SKTC 3 x 15 sec each Figure-4 stretch push/pul 2 x 15 sec each Sidelying thoracic rotation x 10 each Seated upper trap stretch 3 x 15 sec each Seated physioball roll out x 10 Row with blue 2 x 15 Extension with red 2 x 10 Supine horizontal abduction with red 2 x 15 Supine clamshell with red 2 x 15 Bridge 2 x 10  PATIENT EDUCATION:  Education details: HEP Person educated: Patient Education method: Programmer, multimedia, Facilities manager, Actor cues, Verbal  cues Education comprehension: verbalized understanding, returned demonstration, verbal cues required, tactile cues required, and needs further education   HOME EXERCISE PROGRAM: Access Code:  [email or web address]    ASSESSMENT: CLINICAL IMPRESSION: Patient tolerated therapy well with no adverse effects. Therapy continues to focus on gentle stretching for low back and neck region, and progressing her strength with good tolerance. She does demonstrate improved lumbar motion this visit but does report some muscular pulling with movement. No changes to HEP this visit. Patient would benefit from continued skilled PT to progress her mobility and strength in order to reduce pain and maximize functional ability.     OBJECTIVE IMPAIRMENTS: decreased activity tolerance, decreased endurance, decreased knowledge of condition, difficulty walking, decreased ROM, decreased strength, increased fascial restrictions, impaired flexibility, impaired UE functional use, improper body mechanics, postural dysfunction, and pain.    ACTIVITY LIMITATIONS: carrying, lifting, bending, sitting, standing, squatting, reach over head, and locomotion level   PARTICIPATION LIMITATIONS: meal prep, cleaning, laundry, driving, shopping, and community activity   PERSONAL FACTORS: Age, Fitness, Time since onset of injury/illness/exacerbation, and 3+ comorbidities: see PMH above  are also affecting patient's functional outcome.      GOALS: Goals reviewed with patient? Yes   SHORT TERM GOALS: Target date: 06/21/2022   Patient will be independent and compliant with initial HEP.  Baseline: issued at eval  06/20/2022: progressing Goal status: ONGOING   2.  Patient will demonstrate at least 60 degrees of Rt cervical rotation AROM to improve ability to complete head turns while driving.  Baseline: see above  06/20/2022: 55 deg Goal status: ONGOING   3.  Patient will demonstrate at least 160 degrees of Rt shoulder flexion AROM to improve  ability to reach into overhead cabinets.  Baseline: see above  06/20/2022: 170 deg Goal status: MET  LONG TERM GOALS: Target date: 07/23/2022  Patient will score at least 56% on FOTO to signify clinically meaningful improvement in functional abilities.  Baseline: see above Goal status: INITIAL   2.  Patient will be able to lift and lower at least 3# into overhead cabinet with minimal pain.  Baseline: see subjective  Goal status: INITIAL   3.  Patient will demonstrate 4+/5 bilateral hip abductor strength to improve stability about the chain with prolonged walking/standing activity.  Baseline: see above  Goal status: INITIAL   4.  Patient will report pain at worst rated as </= 5/10 to reduce current functional limitations.  Baseline: see subjective  Goal status: INITIAL   5.  Patient will be independent with advanced home program to progress/maintain current level of function.  Baseline: initial HEP issued  Goal status: INITIAL     PLAN: PT FREQUENCY: 1-2x/week   PT DURATION: 8 weeks   PLANNED INTERVENTIONS: Therapeutic exercises, Therapeutic activity, Neuromuscular re-education, Balance training, Gait training, Patient/Family education, Self Care, Joint mobilization, Aquatic Therapy, Dry Needling, Cryotherapy, Moist heat, Manual therapy, and Re-evaluation   PLAN FOR NEXT SESSION: review and progress HEP prn; trunk and shoulder mobility, hip stretching, core/hip/shoulder strengthening as tolerated.    Rosana Hoes, PT, DPT, LAT, ATC 06/27/22  5:00 PM Phone: [phone] Fax: [phone]

## 2022-06-27 ENCOUNTER — Encounter: Payer: Self-pay | Admitting: Physical Therapy

## 2022-06-27 ENCOUNTER — Ambulatory Visit: Payer: 59 | Admitting: Physical Therapy

## 2022-06-27 ENCOUNTER — Other Ambulatory Visit: Payer: Self-pay

## 2022-06-27 DIAGNOSIS — M5459 Other low back pain: Secondary | ICD-10-CM | POA: Diagnosis not present

## 2022-06-27 DIAGNOSIS — M542 Cervicalgia: Secondary | ICD-10-CM | POA: Diagnosis not present

## 2022-06-27 DIAGNOSIS — M25511 Pain in right shoulder: Secondary | ICD-10-CM

## 2022-06-27 DIAGNOSIS — M6281 Muscle weakness (generalized): Secondary | ICD-10-CM | POA: Diagnosis not present

## 2022-06-30 ENCOUNTER — Ambulatory Visit: Payer: 59 | Admitting: Physical Therapy

## 2022-06-30 NOTE — Therapy (Signed)
OUTPATIENT PHYSICAL THERAPY TREATMENT NOTE   Patient Name: Melissa Chavez MRN: [id number] DOB:1961/01/19, 62 y.o., female Today's Date: 07/04/2022  PCP: Georgianne Fick, MD REFERRING PROVIDER: Kathryne Hitch, MD    END OF SESSION:   PT End of Session - 07/04/22 1620     Visit Number 5    Number of Visits 17    Date for PT Re-Evaluation 07/23/22    Authorization Type MCR/MCD    Progress Note Due on Visit 10    PT Start Time 1615    PT Stop Time 1655    PT Time Calculation (min) 40 min    Activity Tolerance Patient tolerated treatment well    Behavior During Therapy WFL for tasks assessed/performed                Past Medical History:  Diagnosis Date   Allergy    Anemia    Asthma    Bipolar 2 disorder (HCC)    Diabetes mellitus without complication (HCC)    GERD (gastroesophageal reflux disease)    Glaucoma    Pt denies   Hernia    Hypertension    Past Surgical History:  Procedure Laterality Date   deviated septum surgery x 2      HERNIA REPAIR     TONSILLECTOMY     Pt denies   TUBAL LIGATION     Patient Active Problem List   Diagnosis Date Noted   Primary osteoarthritis involving multiple joints 02/07/2022   Chronic kidney disease 01/18/2021   Chronic kidney disease, stage 3a (HCC) 01/18/2021   DDD (degenerative disc disease), cervical 01/18/2021   Diabetic renal disease (HCC) 01/18/2021   Idiopathic gout 01/18/2021   Long term (current) use of insulin (HCC) 01/18/2021   Hyperlipidemia, unspecified 01/18/2021   Morbid obesity (HCC) 01/18/2021   Unilateral primary osteoarthritis, left knee 09/13/2016   Unilateral primary osteoarthritis, right knee 09/13/2016   Pressure injury of skin 03/08/2016   Acute gout of left knee 03/07/2016   Sinus congestion 11/29/2015   Bilateral leg edema 09/25/2015   Abdominal pain, left upper quadrant 09/25/2015   DJD (degenerative joint disease) of upper arm 09/07/2015   Prednisone adverse reaction  08/20/2015   Abdominal pain, chronic, epigastric 06/30/2015   Osteoarthritis of right foot 03/22/2015   Type 2 diabetes mellitus with diabetic polyneuropathy, without long-term current use of insulin (HCC) 03/20/2015   BMI 40.0-44.9, adult (HCC) 06/13/2013   Overactive bladder 06/13/2013   Allergic rhinitis 06/13/2013   Asthma, moderate persistent 06/13/2013   BIPOLAR DISORDER UNSPECIFIED 03/18/2008   PERIPHERAL EDEMA 07/03/2007   GERD 01/30/2007   HYPERCHOLESTEROLEMIA 12/07/2006   Essential hypertension 12/07/2006   Osteoarthritis of right knee 12/07/2006   ANEMIA, IRON DEFICIENCY 01/06/2006    REFERRING DIAG: Whiplash injury to neck, subsequent encounter   THERAPY DIAG:  Cervicalgia  Acute pain of right shoulder  Other low back pain  Muscle weakness (generalized)  Rationale for Evaluation and Treatment Rehabilitation  PERTINENT HISTORY: See PMH above  PRECAUTIONS: None   SUBJECTIVE:  SUBJECTIVE STATEMENT:  Patient reports she is doing well. No new issues.  PAIN:  Are you having pain? Yes:  NPRS scale: 6 /10 Pain location: low/mid back and Rt anterior shoulder Pain description: tight;sore Aggravating factors: reaching/lifting overhead, prolonged sitting Relieving factors: heat   OBJECTIVE: (objective measures completed at initial evaluation unless otherwise dated) PATIENT SURVEYS:  FOTO 46% function to 56% predicted    POSTURE:  Rounded shoulders and forward head   PALPATION: Diffuse tenderness about Rt shoulder and thoracolumbar paraspinals            CERVICAL ROM:    Active ROM A/PROM (deg) eval   06/20/2022  Flexion WNL   Extension WNL   Right lateral flexion WNL   Left lateral flexion WNL   Right rotation 42* 55  Left rotation 64 65   (Blank rows = not tested) patient  reports "tenderness" with all cervical AROM *concordant pain    UPPER EXTREMITY ROM:   Active ROM Right eval Left eval Rt / Lt 06/20/22  Shoulder flexion 140   170 / 170  Shoulder extension       Shoulder abduction WNL     Shoulder adduction       Shoulder extension       Shoulder internal rotation T10  T10    Shoulder external rotation C7 T2   Elbow flexion       Elbow extension       Wrist flexion       Wrist extension       Wrist ulnar deviation       Wrist radial deviation       Wrist pronation       Wrist supination        (Blank rows = not tested)   UPPER EXTREMITY MMT:   MMT Right eval Left eval  Shoulder flexion 4 4  Shoulder extension      Shoulder abduction 5 5  Shoulder adduction      Shoulder extension      Shoulder internal rotation 4+ 4+  Shoulder external rotation 4+ 4+  Middle trapezius      Lower trapezius      Elbow flexion      Elbow extension      Wrist flexion      Wrist extension      Wrist ulnar deviation      Wrist radial deviation      Wrist pronation      Wrist supination      Grip strength       (Blank rows = not tested)  LUMBAR ROM:    Active  A/PROM  eval  06/27/2022  Flexion WNL pn WFL  Extension WNL pn  WFL  Right lateral flexion 25% limited pn WFL  Left lateral flexion 25% limited pn  WFL  Right rotation WNL pn  WFL  Left rotation WNL pn WFL   (Blank rows = not tested)  LOWER EXTREMITY MMT:     MMT Right eval Left eval  Hip flexion 4+ 4+  Hip extension      Hip abduction 4- 4-  Hip adduction      Hip internal rotation      Hip external rotation      Knee flexion      Knee extension      Ankle dorsiflexion      Ankle plantarflexion      Ankle inversion      Ankle eversion       (  Blank rows = not tested)  LOWER EXTREMITY ROM:      Active  Right eval Left eval  Hip flexion 95 mid back pain  95 mid back pain   Hip extension      Hip abduction      Hip adduction      Hip internal rotation      Hip  external rotation      Knee flexion      Knee extension      Ankle dorsiflexion      Ankle plantarflexion      Ankle inversion      Ankle eversion       (Blank rows = not tested)     TODAY'S TREATMENT: [facility] Adult PT Treatment:                                                DATE: 07/04/22 Therapeutic Exercise: NuStep L6 x 5 min with UE/LE while taking subjective Seated physioball roll out x 10 Seated hamstring stretch 2 x 20 sec LTR x 10 Figure-4 stretch push/pull 2 x 20 sec each Bridge 2 x 10 SLR 2 x 10 Hooklying clamshell with blue 2 x 20 Sidelying hip abduction 2 x 10 each Step-back stretch at counter x 5 Row with blue 2 x 20 Sit to stand holding yellow med ball with overhead reach 2 x 10   [facility] Adult PT Treatment:                                                DATE: 06/27/22 Therapeutic Exercise: NuStep L6 x 5 min with UE/LE while taking subjective Seated hamstring stretch 2 x 20 sec LTR in figure-4 position x 5 each Figure-4 stretch push/pull 2 x 15 sec each Bridge 2 x 10 SLR 2 x 10 Sidelying hip abduction 2 x 10 each Sidelying thoracic rotation x 10 each Sit to stand holding yellow med ball with overhead reach 2 x 10 Row with blue 2 x 15 Extension with green 2 x 15  [facility] Adult PT Treatment:                                                DATE: 06/22/22 Therapeutic Exercise: NuStep L6 x 5 min with UE/LE while taking subjective Seated hamstring stretch 2 x 20 sec LTR x 10 SKTC 3 x 15 sec each Figure-4 stretch push/pull 2 x 15 sec each Bridge 2 x 10 Supine clamshell with red 2 x 15 Side clamshell with red 2 x 10 each Sidelying thoracic rotation x 10 each Seated horizontal abduction with red 2 x 10 Seated upper trap stretch 2 x 20 sec each Seated physioball roll out x 10 Row with blue 2 x 15 Extension with red 2 x 15 Sit to stand holding blue med ball with overhead press 2 x 10  PATIENT EDUCATION:  Education details: HEP Person educated: Patient Education  method: Programmer, multimedia, Demonstration, Actor cues, Verbal cues Education comprehension: verbalized understanding, returned demonstration, verbal cues required, tactile cues required, and needs further education   HOME EXERCISE PROGRAM:  Access Code: [email or web address]    ASSESSMENT: CLINICAL IMPRESSION: Patient tolerated therapy well with no adverse effects. Therapy continues to focus primarily on strengthening for the core and postural muscles with good tolerance. No changes made to HEP this visit. Overall she reports continued improvement and still demonstrates good motion of the low back and shoulders. Patient would benefit from continued skilled PT to progress her mobility and strength in order to reduce pain and maximize functional ability.     OBJECTIVE IMPAIRMENTS: decreased activity tolerance, decreased endurance, decreased knowledge of condition, difficulty walking, decreased ROM, decreased strength, increased fascial restrictions, impaired flexibility, impaired UE functional use, improper body mechanics, postural dysfunction, and pain.    ACTIVITY LIMITATIONS: carrying, lifting, bending, sitting, standing, squatting, reach over head, and locomotion level   PARTICIPATION LIMITATIONS: meal prep, cleaning, laundry, driving, shopping, and community activity   PERSONAL FACTORS: Age, Fitness, Time since onset of injury/illness/exacerbation, and 3+ comorbidities: see PMH above  are also affecting patient's functional outcome.      GOALS: Goals reviewed with patient? Yes   SHORT TERM GOALS: Target date: 06/21/2022   Patient will be independent and compliant with initial HEP.  Baseline: issued at eval  06/20/2022: progressing Goal status: ONGOING   2.  Patient will demonstrate at least 60 degrees of Rt cervical rotation AROM to improve ability to complete head turns while driving.  Baseline: see above  06/20/2022: 55 deg Goal status: ONGOING   3.  Patient will demonstrate at least 160 degrees  of Rt shoulder flexion AROM to improve ability to reach into overhead cabinets.  Baseline: see above  06/20/2022: 170 deg Goal status: MET  LONG TERM GOALS: Target date: 07/23/2022  Patient will score at least 56% on FOTO to signify clinically meaningful improvement in functional abilities.  Baseline: see above Goal status: INITIAL   2.  Patient will be able to lift and lower at least 3# into overhead cabinet with minimal pain.  Baseline: see subjective  Goal status: INITIAL   3.  Patient will demonstrate 4+/5 bilateral hip abductor strength to improve stability about the chain with prolonged walking/standing activity.  Baseline: see above  Goal status: INITIAL   4.  Patient will report pain at worst rated as </= 5/10 to reduce current functional limitations.  Baseline: see subjective  Goal status: INITIAL   5.  Patient will be independent with advanced home program to progress/maintain current level of function.  Baseline: initial HEP issued  Goal status: INITIAL     PLAN: PT FREQUENCY: 1-2x/week   PT DURATION: 8 weeks   PLANNED INTERVENTIONS: Therapeutic exercises, Therapeutic activity, Neuromuscular re-education, Balance training, Gait training, Patient/Family education, Self Care, Joint mobilization, Aquatic Therapy, Dry Needling, Cryotherapy, Moist heat, Manual therapy, and Re-evaluation   PLAN FOR NEXT SESSION: review and progress HEP prn; trunk and shoulder mobility, hip stretching, core/hip/shoulder strengthening as tolerated.    Rosana Hoes, PT, DPT, LAT, ATC 07/04/22  4:57 PM Phone: [phone] Fax: [phone]

## 2022-07-04 ENCOUNTER — Encounter: Payer: Self-pay | Admitting: Physical Therapy

## 2022-07-04 ENCOUNTER — Ambulatory Visit: Payer: 59 | Admitting: Physical Therapy

## 2022-07-04 ENCOUNTER — Other Ambulatory Visit: Payer: Self-pay

## 2022-07-04 DIAGNOSIS — M5459 Other low back pain: Secondary | ICD-10-CM | POA: Diagnosis not present

## 2022-07-04 DIAGNOSIS — M6281 Muscle weakness (generalized): Secondary | ICD-10-CM

## 2022-07-04 DIAGNOSIS — M542 Cervicalgia: Secondary | ICD-10-CM | POA: Diagnosis not present

## 2022-07-04 DIAGNOSIS — M25511 Pain in right shoulder: Secondary | ICD-10-CM | POA: Diagnosis not present

## 2022-07-05 NOTE — Therapy (Signed)
OUTPATIENT PHYSICAL THERAPY TREATMENT NOTE   Patient Name: Melissa Chavez MRN: [id number] DOB:06-Jul-1960, 62 y.o., female Today's Date: 07/06/2022  PCP: Georgianne Fick, MD REFERRING PROVIDER: Kathryne Hitch, MD    END OF SESSION:   PT End of Session - 07/06/22 1014     Visit Number 6    Number of Visits 17    Date for PT Re-Evaluation 07/23/22    Authorization Type MCR/MCD    Progress Note Due on Visit 10    PT Start Time 1015    PT Stop Time 1055    PT Time Calculation (min) 40 min    Activity Tolerance Patient tolerated treatment well    Behavior During Therapy WFL for tasks assessed/performed                 Past Medical History:  Diagnosis Date   Allergy    Anemia    Asthma    Bipolar 2 disorder (HCC)    Diabetes mellitus without complication (HCC)    GERD (gastroesophageal reflux disease)    Glaucoma    Pt denies   Hernia    Hypertension    Past Surgical History:  Procedure Laterality Date   deviated septum surgery x 2      HERNIA REPAIR     TONSILLECTOMY     Pt denies   TUBAL LIGATION     Patient Active Problem List   Diagnosis Date Noted   Primary osteoarthritis involving multiple joints 02/07/2022   Chronic kidney disease 01/18/2021   Chronic kidney disease, stage 3a (HCC) 01/18/2021   DDD (degenerative disc disease), cervical 01/18/2021   Diabetic renal disease (HCC) 01/18/2021   Idiopathic gout 01/18/2021   Long term (current) use of insulin (HCC) 01/18/2021   Hyperlipidemia, unspecified 01/18/2021   Morbid obesity (HCC) 01/18/2021   Unilateral primary osteoarthritis, left knee 09/13/2016   Unilateral primary osteoarthritis, right knee 09/13/2016   Pressure injury of skin 03/08/2016   Acute gout of left knee 03/07/2016   Sinus congestion 11/29/2015   Bilateral leg edema 09/25/2015   Abdominal pain, left upper quadrant 09/25/2015   DJD (degenerative joint disease) of upper arm 09/07/2015   Prednisone adverse reaction  08/20/2015   Abdominal pain, chronic, epigastric 06/30/2015   Osteoarthritis of right foot 03/22/2015   Type 2 diabetes mellitus with diabetic polyneuropathy, without long-term current use of insulin (HCC) 03/20/2015   BMI 40.0-44.9, adult (HCC) 06/13/2013   Overactive bladder 06/13/2013   Allergic rhinitis 06/13/2013   Asthma, moderate persistent 06/13/2013   BIPOLAR DISORDER UNSPECIFIED 03/18/2008   PERIPHERAL EDEMA 07/03/2007   GERD 01/30/2007   HYPERCHOLESTEROLEMIA 12/07/2006   Essential hypertension 12/07/2006   Osteoarthritis of right knee 12/07/2006   ANEMIA, IRON DEFICIENCY 01/06/2006    REFERRING DIAG: Whiplash injury to neck, subsequent encounter   THERAPY DIAG:  Cervicalgia  Acute pain of right shoulder  Other low back pain  Muscle weakness (generalized)  Rationale for Evaluation and Treatment Rehabilitation  PERTINENT HISTORY: See PMH above  PRECAUTIONS: None   SUBJECTIVE:  SUBJECTIVE STATEMENT:  Patient she is doing well, states she feels pretty good. A little ache in the knee.  PAIN:  Are you having pain? Yes:  NPRS scale: 5 /10 Pain location: low/mid back and Rt anterior shoulder Pain description: tight;sore Aggravating factors: reaching/lifting overhead, prolonged sitting Relieving factors: heat   OBJECTIVE: (objective measures completed at initial evaluation unless otherwise dated) PATIENT SURVEYS:  FOTO 46% function to 56% predicted   07/06/2022: 60%   POSTURE:  Rounded shoulders and forward head   PALPATION: Diffuse tenderness about Rt shoulder and thoracolumbar paraspinals            CERVICAL ROM:    Active ROM A/PROM (deg) eval   06/20/2022   07/06/2022  Flexion WNL    Extension WNL    Right lateral flexion WNL    Left lateral flexion WNL    Right rotation  42* 55 60  Left rotation 64 65 65   (Blank rows = not tested) patient reports "tenderness" with all cervical AROM *concordant pain    UPPER EXTREMITY ROM:   Active ROM Right eval Left eval Rt / Lt 06/20/22  Shoulder flexion 140   170 / 170  Shoulder extension       Shoulder abduction WNL     Shoulder adduction       Shoulder extension       Shoulder internal rotation T10  T10    Shoulder external rotation C7 T2   Elbow flexion       Elbow extension       Wrist flexion       Wrist extension       Wrist ulnar deviation       Wrist radial deviation       Wrist pronation       Wrist supination        (Blank rows = not tested)   UPPER EXTREMITY MMT:   MMT Right eval Left eval  Shoulder flexion 4 4  Shoulder extension      Shoulder abduction 5 5  Shoulder adduction      Shoulder extension      Shoulder internal rotation 4+ 4+  Shoulder external rotation 4+ 4+  Middle trapezius      Lower trapezius      Elbow flexion      Elbow extension      Wrist flexion      Wrist extension      Wrist ulnar deviation      Wrist radial deviation      Wrist pronation      Wrist supination      Grip strength       (Blank rows = not tested)  LUMBAR ROM:    Active  A/PROM  eval  06/27/2022  Flexion WNL pn WFL  Extension WNL pn  WFL  Right lateral flexion 25% limited pn WFL  Left lateral flexion 25% limited pn  WFL  Right rotation WNL pn  WFL  Left rotation WNL pn WFL   (Blank rows = not tested)  LOWER EXTREMITY MMT:     MMT Right eval Left eval  Hip flexion 4+ 4+  Hip extension      Hip abduction 4- 4-  Hip adduction      Hip internal rotation      Hip external rotation      Knee flexion      Knee extension      Ankle dorsiflexion  Ankle plantarflexion      Ankle inversion      Ankle eversion       (Blank rows = not tested)  LOWER EXTREMITY ROM:      Active  Right eval Left eval  Hip flexion 95 mid back pain  95 mid back pain   Hip extension       Hip abduction      Hip adduction      Hip internal rotation      Hip external rotation      Knee flexion      Knee extension      Ankle dorsiflexion      Ankle plantarflexion      Ankle inversion      Ankle eversion       (Blank rows = not tested)     TODAY'S TREATMENT: [facility] Adult PT Treatment:                                                DATE: 07/06/22 Therapeutic Exercise: NuStep L6 x 5 min with UE/LE while taking subjective Row with blue 2 x 20 Standing physionall roll up wall x 20 Bridge 2 x 10 SLR 2 x 10 Hooklying clamshell with blue 2 x 20 Sidelying hip abduction 2 x 10 each Sit to stand x 10, holding yellow med ball with overhead reach 2 x 10 Seated upper trap stretch 2 x 15 sec each   [facility] Adult PT Treatment:                                                DATE: 07/04/22 Therapeutic Exercise: NuStep L6 x 5 min with UE/LE while taking subjective Seated physioball roll out x 10 Seated hamstring stretch 2 x 20 sec LTR x 10 Figure-4 stretch push/pull 2 x 20 sec each Bridge 2 x 10 SLR 2 x 10 Hooklying clamshell with blue 2 x 20 Sidelying hip abduction 2 x 10 each Step-back stretch at counter x 5 Row with blue 2 x 20 Sit to stand holding yellow med ball with overhead reach 2 x 10  [facility] Adult PT Treatment:                                                DATE: 06/27/22 Therapeutic Exercise: NuStep L6 x 5 min with UE/LE while taking subjective Seated hamstring stretch 2 x 20 sec LTR in figure-4 position x 5 each Figure-4 stretch push/pull 2 x 15 sec each Bridge 2 x 10 SLR 2 x 10 Sidelying hip abduction 2 x 10 each Sidelying thoracic rotation x 10 each Sit to stand holding yellow med ball with overhead reach 2 x 10 Row with blue 2 x 15 Extension with green 2 x 15  PATIENT EDUCATION:  Education details: HEP update Person educated: Patient Education method: Explanation, Demonstration, Tactile cues, Verbal cues, Handout Education comprehension: verbalized  understanding, returned demonstration, verbal cues required, tactile cues required, and needs further education   HOME EXERCISE PROGRAM: Access Code: [email or web address]    ASSESSMENT: CLINICAL IMPRESSION: Patient tolerated therapy well with  no adverse effects. Therapy focused primarily on progress her strengthening exercises and updating her HEP with good tolerance. She does report an improvement in her functional ability on FOTO achieving her FOTO LTG, and demonstrates improved cervical rotation achieving her STG. Updated her HEP to progress strengthening. Patient would benefit from continued skilled PT to progress her mobility and strength in order to reduce pain and maximize functional ability.    OBJECTIVE IMPAIRMENTS: decreased activity tolerance, decreased endurance, decreased knowledge of condition, difficulty walking, decreased ROM, decreased strength, increased fascial restrictions, impaired flexibility, impaired UE functional use, improper body mechanics, postural dysfunction, and pain.    ACTIVITY LIMITATIONS: carrying, lifting, bending, sitting, standing, squatting, reach over head, and locomotion level   PARTICIPATION LIMITATIONS: meal prep, cleaning, laundry, driving, shopping, and community activity   PERSONAL FACTORS: Age, Fitness, Time since onset of injury/illness/exacerbation, and 3+ comorbidities: see PMH above  are also affecting patient's functional outcome.      GOALS: Goals reviewed with patient? Yes   SHORT TERM GOALS: Target date: 06/21/2022   Patient will be independent and compliant with initial HEP.  Baseline: issued at eval  06/20/2022: progressing 07/06/2022: independent with initial HEP Goal status: MET   2.  Patient will demonstrate at least 60 degrees of Rt cervical rotation AROM to improve ability to complete head turns while driving.  Baseline: see above  06/20/2022: 55 deg 07/06/2022: 60 deg Goal status: ONGOING   3.  Patient will demonstrate at least 160  degrees of Rt shoulder flexion AROM to improve ability to reach into overhead cabinets.  Baseline: see above  06/20/2022: 170 deg Goal status: MET  LONG TERM GOALS: Target date: 07/23/2022  Patient will score at least 56% on FOTO to signify clinically meaningful improvement in functional abilities.  Baseline: see above 07/06/2022: 60% Goal status: MET   2.  Patient will be able to lift and lower at least 3# into overhead cabinet with minimal pain.  Baseline: see subjective  Goal status: INITIAL   3.  Patient will demonstrate 4+/5 bilateral hip abductor strength to improve stability about the chain with prolonged walking/standing activity.  Baseline: see above  Goal status: INITIAL   4.  Patient will report pain at worst rated as </= 5/10 to reduce current functional limitations.  Baseline: see subjective  Goal status: INITIAL   5.  Patient will be independent with advanced home program to progress/maintain current level of function.  Baseline: initial HEP issued  07/06/2022: progressing Goal status: ONGOING     PLAN: PT FREQUENCY: 1-2x/week   PT DURATION: 8 weeks   PLANNED INTERVENTIONS: Therapeutic exercises, Therapeutic activity, Neuromuscular re-education, Balance training, Gait training, Patient/Family education, Self Care, Joint mobilization, Aquatic Therapy, Dry Needling, Cryotherapy, Moist heat, Manual therapy, and Re-evaluation   PLAN FOR NEXT SESSION: review and progress HEP prn; trunk and shoulder mobility, hip stretching, core/hip/shoulder strengthening as tolerated.    Rosana Hoes, PT, DPT, LAT, ATC 07/06/22  10:58 AM Phone: [phone] Fax: [phone]

## 2022-07-06 ENCOUNTER — Encounter: Payer: Self-pay | Admitting: Physical Therapy

## 2022-07-06 ENCOUNTER — Ambulatory Visit: Payer: 59 | Attending: Orthopaedic Surgery | Admitting: Physical Therapy

## 2022-07-06 ENCOUNTER — Other Ambulatory Visit: Payer: Self-pay

## 2022-07-06 DIAGNOSIS — M542 Cervicalgia: Secondary | ICD-10-CM | POA: Insufficient documentation

## 2022-07-06 DIAGNOSIS — M6281 Muscle weakness (generalized): Secondary | ICD-10-CM | POA: Diagnosis not present

## 2022-07-06 DIAGNOSIS — M5459 Other low back pain: Secondary | ICD-10-CM

## 2022-07-06 DIAGNOSIS — M25511 Pain in right shoulder: Secondary | ICD-10-CM

## 2022-07-06 NOTE — Patient Instructions (Signed)
Access Code: [email or web address] URL: [email or web address] Date: 07/06/2022 Prepared by: Rosana Hoes  Exercises - Supine Lower Trunk Rotation  - 1 x daily - 2 sets - 10 reps - Hooklying Single Knee to Chest Stretch  - 1 x daily - 3 reps - 20 hold - Supine Figure 4 Piriformis Stretch  - 1 x daily - 3 sets - 20  hold - Supine Piriformis Stretch with Foot on Ground  - 1 x daily - 3 reps - 20 hold - Seated Cervical Sidebending Stretch  - 1 x daily - 3 reps - 20 hold - Bridge  - 3 x weekly - 2 sets - 10 reps - Active Straight Leg Raise with Quad Set  - 3 x weekly - 2 sets - 10 reps - Hooklying Clamshell with Resistance  - 3 x weekly - 2 sets - 20 reps - Sidelying Hip Abduction  - 3 x weekly - 2 sets - 10 reps - Standing Row with Anchored Resistance  - 3 x weekly - 2 sets - 20 reps - Sit to Stand Without Arm Support  - 3 x weekly - 3 sets - 10 reps

## 2022-07-07 ENCOUNTER — Ambulatory Visit: Payer: 59 | Admitting: Physical Therapy

## 2022-07-11 ENCOUNTER — Ambulatory Visit: Payer: 59

## 2022-07-11 DIAGNOSIS — M542 Cervicalgia: Secondary | ICD-10-CM

## 2022-07-11 DIAGNOSIS — M6281 Muscle weakness (generalized): Secondary | ICD-10-CM | POA: Diagnosis not present

## 2022-07-11 DIAGNOSIS — M5459 Other low back pain: Secondary | ICD-10-CM

## 2022-07-11 DIAGNOSIS — M25511 Pain in right shoulder: Secondary | ICD-10-CM | POA: Diagnosis not present

## 2022-07-11 NOTE — Therapy (Signed)
OUTPATIENT PHYSICAL THERAPY TREATMENT NOTE   Patient Name: Melissa Chavez MRN: [id number] DOB:1960/09/22, 62 y.o., female Today's Date: 07/11/2022  PCP: Georgianne Fick, MD REFERRING PROVIDER: Kathryne Hitch, MD    END OF SESSION:   PT End of Session - 07/11/22 1623     Visit Number 7    Number of Visits 17    Date for PT Re-Evaluation 07/23/22    Authorization Type MCR/MCD    Progress Note Due on Visit 10    PT Start Time 1628    PT Stop Time 1711    PT Time Calculation (min) 43 min    Activity Tolerance Patient tolerated treatment well    Behavior During Therapy WFL for tasks assessed/performed                  Past Medical History:  Diagnosis Date   Allergy    Anemia    Asthma    Bipolar 2 disorder (HCC)    Diabetes mellitus without complication (HCC)    GERD (gastroesophageal reflux disease)    Glaucoma    Pt denies   Hernia    Hypertension    Past Surgical History:  Procedure Laterality Date   deviated septum surgery x 2      HERNIA REPAIR     TONSILLECTOMY     Pt denies   TUBAL LIGATION     Patient Active Problem List   Diagnosis Date Noted   Primary osteoarthritis involving multiple joints 02/07/2022   Chronic kidney disease 01/18/2021   Chronic kidney disease, stage 3a (HCC) 01/18/2021   DDD (degenerative disc disease), cervical 01/18/2021   Diabetic renal disease (HCC) 01/18/2021   Idiopathic gout 01/18/2021   Long term (current) use of insulin (HCC) 01/18/2021   Hyperlipidemia, unspecified 01/18/2021   Morbid obesity (HCC) 01/18/2021   Unilateral primary osteoarthritis, left knee 09/13/2016   Unilateral primary osteoarthritis, right knee 09/13/2016   Pressure injury of skin 03/08/2016   Acute gout of left knee 03/07/2016   Sinus congestion 11/29/2015   Bilateral leg edema 09/25/2015   Abdominal pain, left upper quadrant 09/25/2015   DJD (degenerative joint disease) of upper arm 09/07/2015   Prednisone adverse reaction  08/20/2015   Abdominal pain, chronic, epigastric 06/30/2015   Osteoarthritis of right foot 03/22/2015   Type 2 diabetes mellitus with diabetic polyneuropathy, without long-term current use of insulin (HCC) 03/20/2015   BMI 40.0-44.9, adult (HCC) 06/13/2013   Overactive bladder 06/13/2013   Allergic rhinitis 06/13/2013   Asthma, moderate persistent 06/13/2013   BIPOLAR DISORDER UNSPECIFIED 03/18/2008   PERIPHERAL EDEMA 07/03/2007   GERD 01/30/2007   HYPERCHOLESTEROLEMIA 12/07/2006   Essential hypertension 12/07/2006   Osteoarthritis of right knee 12/07/2006   ANEMIA, IRON DEFICIENCY 01/06/2006    REFERRING DIAG: Whiplash injury to neck, subsequent encounter   THERAPY DIAG:  Cervicalgia  Acute pain of right shoulder  Other low back pain  Muscle weakness (generalized)  Rationale for Evaluation and Treatment Rehabilitation  PERTINENT HISTORY: See PMH above  PRECAUTIONS: None   SUBJECTIVE:  SUBJECTIVE STATEMENT:  "I feel a lot better. Sometimes my knee starts acting up."   PAIN:  Are you having pain? Yes:  NPRS scale: 6 /10 Pain location: Rt shoulder  Pain description: tight;sore Aggravating factors: reaching/lifting overhead, prolonged sitting Relieving factors: heat   OBJECTIVE: (objective measures completed at initial evaluation unless otherwise dated) PATIENT SURVEYS:  FOTO 46% function to 56% predicted   07/06/2022: 60%   POSTURE:  Rounded shoulders and forward head   PALPATION: Diffuse tenderness about Rt shoulder and thoracolumbar paraspinals            CERVICAL ROM:    Active ROM A/PROM (deg) eval   06/20/2022   07/06/2022  Flexion WNL    Extension WNL    Right lateral flexion WNL    Left lateral flexion WNL    Right rotation 42* 55 60  Left rotation 64 65 65   (Blank  rows = not tested) patient reports "tenderness" with all cervical AROM *concordant pain    UPPER EXTREMITY ROM:   Active ROM Right eval Left eval Rt / Lt 06/20/22  Shoulder flexion 140   170 / 170  Shoulder extension       Shoulder abduction WNL     Shoulder adduction       Shoulder extension       Shoulder internal rotation T10  T10    Shoulder external rotation C7 T2   Elbow flexion       Elbow extension       Wrist flexion       Wrist extension       Wrist ulnar deviation       Wrist radial deviation       Wrist pronation       Wrist supination        (Blank rows = not tested)   UPPER EXTREMITY MMT:   MMT Right eval Left eval 07/11/22  Shoulder flexion 4 4 5/5 bilaterally  Shoulder extension       Shoulder abduction 5 5   Shoulder adduction       Shoulder extension       Shoulder internal rotation 4+ 4+   Shoulder external rotation 4+ 4+   Middle trapezius       Lower trapezius       Elbow flexion       Elbow extension       Wrist flexion       Wrist extension       Wrist ulnar deviation       Wrist radial deviation       Wrist pronation       Wrist supination       Grip strength        (Blank rows = not tested)  LUMBAR ROM:    Active  A/PROM  eval  06/27/2022  Flexion WNL pn WFL  Extension WNL pn  WFL  Right lateral flexion 25% limited pn WFL  Left lateral flexion 25% limited pn  WFL  Right rotation WNL pn  WFL  Left rotation WNL pn WFL   (Blank rows = not tested)  LOWER EXTREMITY MMT:     MMT Right eval Left eval  Hip flexion 4+ 4+  Hip extension      Hip abduction 4- 4-  Hip adduction      Hip internal rotation      Hip external rotation      Knee flexion  Knee extension      Ankle dorsiflexion      Ankle plantarflexion      Ankle inversion      Ankle eversion       (Blank rows = not tested)  LOWER EXTREMITY ROM:      Active  Right eval Left eval  Hip flexion 95 mid back pain  95 mid back pain   Hip extension      Hip  abduction      Hip adduction      Hip internal rotation      Hip external rotation      Knee flexion      Knee extension      Ankle dorsiflexion      Ankle plantarflexion      Ankle inversion      Ankle eversion       (Blank rows = not tested)     TODAY'S TREATMENT: [facility] Adult PT Treatment:                                                DATE: 07/11/22 Therapeutic Exercise: NuStep level 6 x 5 minutes UE/LE  Bilateral shoulder ER red band 2 x 10  Supine horizontal shoulder abduction red band 2 x 10  Supine single arm serratus punch 2 x 10;2# Standing shoulder flexion 2 x 10; 2#  Standing shoulder scaption 2 x 10;2# Sidelying shoulder abduction 2 x 10; 2# Sidelying ER 2 x 10;2# Rows blue band 2 x 10 Resisted shoulder extension red band 2 x 15  Hip bridge with abduction 2 x 10 blue band  Updated HEP     [facility] Adult PT Treatment:                                                DATE: 07/06/22 Therapeutic Exercise: NuStep L6 x 5 min with UE/LE while taking subjective Row with blue 2 x 20 Standing physionall roll up wall x 20 Bridge 2 x 10 SLR 2 x 10 Hooklying clamshell with blue 2 x 20 Sidelying hip abduction 2 x 10 each Sit to stand x 10, holding yellow med ball with overhead reach 2 x 10 Seated upper trap stretch 2 x 15 sec each   [facility] Adult PT Treatment:                                                DATE: 07/04/22 Therapeutic Exercise: NuStep L6 x 5 min with UE/LE while taking subjective Seated physioball roll out x 10 Seated hamstring stretch 2 x 20 sec LTR x 10 Figure-4 stretch push/pull 2 x 20 sec each Bridge 2 x 10 SLR 2 x 10 Hooklying clamshell with blue 2 x 20 Sidelying hip abduction 2 x 10 each Step-back stretch at counter x 5 Row with blue 2 x 20 Sit to stand holding yellow med ball with overhead reach 2 x 10   PATIENT EDUCATION:  Education details: HEP update Person educated: Patient Education method: Explanation, Demonstration, Tactile cues, Verbal cues,  Handout Education comprehension: verbalized understanding, returned demonstration, verbal  cues required, tactile cues required, and needs further education   HOME EXERCISE PROGRAM: Access Code: [email or web address]    ASSESSMENT: CLINICAL IMPRESSION: Patient tolerated therapy well with no adverse effects. Session focused on her shoulder today per patient request. Able to progress shoulder and periscapular strengthening without an increase in pain. She requires frequent cues to reduce shoulder shrug as well as maintain proper form with majority of strengthening exercises. HEP was updated to include further strengthening.    OBJECTIVE IMPAIRMENTS: decreased activity tolerance, decreased endurance, decreased knowledge of condition, difficulty walking, decreased ROM, decreased strength, increased fascial restrictions, impaired flexibility, impaired UE functional use, improper body mechanics, postural dysfunction, and pain.    ACTIVITY LIMITATIONS: carrying, lifting, bending, sitting, standing, squatting, reach over head, and locomotion level   PARTICIPATION LIMITATIONS: meal prep, cleaning, laundry, driving, shopping, and community activity   PERSONAL FACTORS: Age, Fitness, Time since onset of injury/illness/exacerbation, and 3+ comorbidities: see PMH above  are also affecting patient's functional outcome.      GOALS: Goals reviewed with patient? Yes   SHORT TERM GOALS: Target date: 06/21/2022   Patient will be independent and compliant with initial HEP.  Baseline: issued at eval  06/20/2022: progressing 07/06/2022: independent with initial HEP Goal status: MET   2.  Patient will demonstrate at least 60 degrees of Rt cervical rotation AROM to improve ability to complete head turns while driving.  Baseline: see above  06/20/2022: 55 deg 07/06/2022: 60 deg Goal status: met   3.  Patient will demonstrate at least 160 degrees of Rt shoulder flexion AROM to improve ability to reach into overhead cabinets.   Baseline: see above  06/20/2022: 170 deg Goal status: MET  LONG TERM GOALS: Target date: 07/23/2022  Patient will score at least 56% on FOTO to signify clinically meaningful improvement in functional abilities.  Baseline: see above 07/06/2022: 60% Goal status: MET   2.  Patient will be able to lift and lower at least 3# into overhead cabinet with minimal pain.  Baseline: see subjective  Goal status: INITIAL   3.  Patient will demonstrate 4+/5 bilateral hip abductor strength to improve stability about the chain with prolonged walking/standing activity.  Baseline: see above  Goal status: INITIAL   4.  Patient will report pain at worst rated as </= 5/10 to reduce current functional limitations.  Baseline: see subjective  Goal status: INITIAL   5.  Patient will be independent with advanced home program to progress/maintain current level of function.  Baseline: initial HEP issued  07/06/2022: progressing Goal status: ONGOING     PLAN: PT FREQUENCY: 1-2x/week   PT DURATION: 8 weeks   PLANNED INTERVENTIONS: Therapeutic exercises, Therapeutic activity, Neuromuscular re-education, Balance training, Gait training, Patient/Family education, Self Care, Joint mobilization, Aquatic Therapy, Dry Needling, Cryotherapy, Moist heat, Manual therapy, and Re-evaluation   PLAN FOR NEXT SESSION: review and progress HEP prn; trunk and shoulder mobility, hip stretching, core/hip/shoulder strengthening as tolerated.   Letitia Libra, PT, DPT, ATC 07/11/22 5:11 PM

## 2022-07-14 ENCOUNTER — Ambulatory Visit: Payer: 59 | Admitting: Physical Therapy

## 2022-07-14 ENCOUNTER — Encounter: Payer: Self-pay | Admitting: Physical Therapy

## 2022-07-14 ENCOUNTER — Other Ambulatory Visit: Payer: Self-pay

## 2022-07-14 DIAGNOSIS — M25511 Pain in right shoulder: Secondary | ICD-10-CM | POA: Diagnosis not present

## 2022-07-14 DIAGNOSIS — M6281 Muscle weakness (generalized): Secondary | ICD-10-CM

## 2022-07-14 DIAGNOSIS — M5459 Other low back pain: Secondary | ICD-10-CM

## 2022-07-14 DIAGNOSIS — M542 Cervicalgia: Secondary | ICD-10-CM | POA: Diagnosis not present

## 2022-07-14 NOTE — Therapy (Signed)
OUTPATIENT PHYSICAL THERAPY TREATMENT NOTE   Patient Name: Melissa Chavez MRN: [id number] DOB:1960-04-03, 62 y.o., female Today's Date: 07/14/2022  PCP: Georgianne Fick, MD REFERRING PROVIDER: Kathryne Hitch, MD    END OF SESSION:   PT End of Session - 07/14/22 1639     Visit Number 8    Number of Visits 17    Date for PT Re-Evaluation 07/23/22    Authorization Type MCR/MCD    Progress Note Due on Visit 10    PT Start Time 1616    PT Stop Time 1657    PT Time Calculation (min) 41 min    Activity Tolerance Patient tolerated treatment well    Behavior During Therapy WFL for tasks assessed/performed                   Past Medical History:  Diagnosis Date   Allergy    Anemia    Asthma    Bipolar 2 disorder (HCC)    Diabetes mellitus without complication (HCC)    GERD (gastroesophageal reflux disease)    Glaucoma    Pt denies   Hernia    Hypertension    Past Surgical History:  Procedure Laterality Date   deviated septum surgery x 2      HERNIA REPAIR     TONSILLECTOMY     Pt denies   TUBAL LIGATION     Patient Active Problem List   Diagnosis Date Noted   Primary osteoarthritis involving multiple joints 02/07/2022   Chronic kidney disease 01/18/2021   Chronic kidney disease, stage 3a (HCC) 01/18/2021   DDD (degenerative disc disease), cervical 01/18/2021   Diabetic renal disease (HCC) 01/18/2021   Idiopathic gout 01/18/2021   Long term (current) use of insulin (HCC) 01/18/2021   Hyperlipidemia, unspecified 01/18/2021   Morbid obesity (HCC) 01/18/2021   Unilateral primary osteoarthritis, left knee 09/13/2016   Unilateral primary osteoarthritis, right knee 09/13/2016   Pressure injury of skin 03/08/2016   Acute gout of left knee 03/07/2016   Sinus congestion 11/29/2015   Bilateral leg edema 09/25/2015   Abdominal pain, left upper quadrant 09/25/2015   DJD (degenerative joint disease) of upper arm 09/07/2015   Prednisone adverse  reaction 08/20/2015   Abdominal pain, chronic, epigastric 06/30/2015   Osteoarthritis of right foot 03/22/2015   Type 2 diabetes mellitus with diabetic polyneuropathy, without long-term current use of insulin (HCC) 03/20/2015   BMI 40.0-44.9, adult (HCC) 06/13/2013   Overactive bladder 06/13/2013   Allergic rhinitis 06/13/2013   Asthma, moderate persistent 06/13/2013   BIPOLAR DISORDER UNSPECIFIED 03/18/2008   PERIPHERAL EDEMA 07/03/2007   GERD 01/30/2007   HYPERCHOLESTEROLEMIA 12/07/2006   Essential hypertension 12/07/2006   Osteoarthritis of right knee 12/07/2006   ANEMIA, IRON DEFICIENCY 01/06/2006    REFERRING DIAG: Whiplash injury to neck, subsequent encounter   THERAPY DIAG:  Cervicalgia  Acute pain of right shoulder  Other low back pain  Muscle weakness (generalized)  Rationale for Evaluation and Treatment Rehabilitation  PERTINENT HISTORY: See PMH above  PRECAUTIONS: None   SUBJECTIVE:  SUBJECTIVE STATEMENT:  Patient reports she is doing well with no new issues.  PAIN:  Are you having pain? Yes:  NPRS scale: 0 /10 Pain location: Rt shoulder  Pain description: tight;sore Aggravating factors: reaching/lifting overhead, prolonged sitting Relieving factors: heat   OBJECTIVE: (objective measures completed at initial evaluation unless otherwise dated) PATIENT SURVEYS:  FOTO 46% function to 56% predicted   07/06/2022: 60%   POSTURE:  Rounded shoulders and forward head   PALPATION: Diffuse tenderness about Rt shoulder and thoracolumbar paraspinals            CERVICAL ROM:    Active ROM A/PROM (deg) eval   06/20/2022   07/06/2022  Flexion WNL    Extension WNL    Right lateral flexion WNL    Left lateral flexion WNL    Right rotation 42* 55 60  Left rotation 64 65 65   (Blank  rows = not tested) patient reports "tenderness" with all cervical AROM *concordant pain    UPPER EXTREMITY ROM:   Active ROM Right eval Left eval Rt / Lt 06/20/22  Shoulder flexion 140   170 / 170  Shoulder extension       Shoulder abduction WNL     Shoulder adduction       Shoulder extension       Shoulder internal rotation T10  T10    Shoulder external rotation C7 T2   Elbow flexion       Elbow extension       Wrist flexion       Wrist extension       Wrist ulnar deviation       Wrist radial deviation       Wrist pronation       Wrist supination        (Blank rows = not tested)   UPPER EXTREMITY MMT:   MMT Right eval Left eval 07/11/22  Shoulder flexion 4 4 5/5 bilaterally  Shoulder extension       Shoulder abduction 5 5   Shoulder adduction       Shoulder extension       Shoulder internal rotation 4+ 4+   Shoulder external rotation 4+ 4+   Middle trapezius       Lower trapezius       Elbow flexion       Elbow extension       Wrist flexion       Wrist extension       Wrist ulnar deviation       Wrist radial deviation       Wrist pronation       Wrist supination       Grip strength        (Blank rows = not tested)  LUMBAR ROM:    Active  A/PROM  eval  06/27/2022  Flexion WNL pn WFL  Extension WNL pn  WFL  Right lateral flexion 25% limited pn WFL  Left lateral flexion 25% limited pn  WFL  Right rotation WNL pn  WFL  Left rotation WNL pn WFL   (Blank rows = not tested)  LOWER EXTREMITY MMT:     MMT Right eval Left eval  Hip flexion 4+ 4+  Hip extension      Hip abduction 4- 4-  Hip adduction      Hip internal rotation      Hip external rotation      Knee flexion      Knee extension  Ankle dorsiflexion      Ankle plantarflexion      Ankle inversion      Ankle eversion       (Blank rows = not tested)  LOWER EXTREMITY ROM:      Active  Right eval Left eval  Hip flexion 95 mid back pain  95 mid back pain   Hip extension      Hip  abduction      Hip adduction      Hip internal rotation      Hip external rotation      Knee flexion      Knee extension      Ankle dorsiflexion      Ankle plantarflexion      Ankle inversion      Ankle eversion       (Blank rows = not tested)     TODAY'S TREATMENT: [facility] Adult PT Treatment:                                                DATE: 07/14/22 Therapeutic Exercise: NuStep L7 x 5 minutes UE/LE while taking subjective Row with blue x 20 Extension with yellow x 20 Supine horizontal shoulder abduction yellow band 2 x 20  Supine diagonals x 10 each Supine flexion with yellow 2 x 10 Seated double ER and scap retraction with yellow x 20 Sit to stand holding yellow med ball with overhead reach 2 x 10 Bridge 2 x 10 90-90 abdominal set 2 x 5 with 10 sec hold Pallof press with blue 2 x 10 each   [facility] Adult PT Treatment:                                                DATE: 07/11/22 Therapeutic Exercise: NuStep level 6 x 5 minutes UE/LE  Bilateral shoulder ER red band 2 x 10  Supine horizontal shoulder abduction red band 2 x 10  Supine single arm serratus punch 2 x 10;2# Standing shoulder flexion 2 x 10; 2#  Standing shoulder scaption 2 x 10;2# Sidelying shoulder abduction 2 x 10; 2# Sidelying ER 2 x 10;2# Rows blue band 2 x 10 Resisted shoulder extension red band 2 x 15  Hip bridge with abduction 2 x 10 blue band  Updated HEP   [facility] Adult PT Treatment:                                                DATE: 07/06/22 Therapeutic Exercise: NuStep L6 x 5 min with UE/LE while taking subjective Row with blue 2 x 20 Standing physionall roll up wall x 20 Bridge 2 x 10 SLR 2 x 10 Hooklying clamshell with blue 2 x 20 Sidelying hip abduction 2 x 10 each Sit to stand x 10, holding yellow med ball with overhead reach 2 x 10 Seated upper trap stretch 2 x 15 sec each  PATIENT EDUCATION:  Education details: HEP Person educated: Patient Education method: Explanation, Demonstration,  Tactile cues, Verbal cues Education comprehension: verbalized understanding, returned demonstration, verbal cues required, tactile cues required, and needs  further education   HOME EXERCISE PROGRAM: Access Code: [email or web address]    ASSESSMENT: CLINICAL IMPRESSION: Patient tolerated therapy well with no adverse effects. Therapy focused primarily on strengthening for the shoulders, core, and LE. She does require occasional cues for posture and proper exercise technique. No pain reported with therapy and no changes made to HEP this visit. Patient would benefit from continued skilled PT to progress her mobility and strength in order to reduce pain and maximize functional ability.    OBJECTIVE IMPAIRMENTS: decreased activity tolerance, decreased endurance, decreased knowledge of condition, difficulty walking, decreased ROM, decreased strength, increased fascial restrictions, impaired flexibility, impaired UE functional use, improper body mechanics, postural dysfunction, and pain.    ACTIVITY LIMITATIONS: carrying, lifting, bending, sitting, standing, squatting, reach over head, and locomotion level   PARTICIPATION LIMITATIONS: meal prep, cleaning, laundry, driving, shopping, and community activity   PERSONAL FACTORS: Age, Fitness, Time since onset of injury/illness/exacerbation, and 3+ comorbidities: see PMH above  are also affecting patient's functional outcome.      GOALS: Goals reviewed with patient? Yes   SHORT TERM GOALS: Target date: 06/21/2022   Patient will be independent and compliant with initial HEP.  Baseline: issued at eval  06/20/2022: progressing 07/06/2022: independent with initial HEP Goal status: MET   2.  Patient will demonstrate at least 60 degrees of Rt cervical rotation AROM to improve ability to complete head turns while driving.  Baseline: see above  06/20/2022: 55 deg 07/06/2022: 60 deg Goal status: met   3.  Patient will demonstrate at least 160 degrees of Rt shoulder  flexion AROM to improve ability to reach into overhead cabinets.  Baseline: see above  06/20/2022: 170 deg Goal status: MET  LONG TERM GOALS: Target date: 07/23/2022  Patient will score at least 56% on FOTO to signify clinically meaningful improvement in functional abilities.  Baseline: see above 07/06/2022: 60% Goal status: MET   2.  Patient will be able to lift and lower at least 3# into overhead cabinet with minimal pain.  Baseline: see subjective  Goal status: INITIAL   3.  Patient will demonstrate 4+/5 bilateral hip abductor strength to improve stability about the chain with prolonged walking/standing activity.  Baseline: see above  Goal status: INITIAL   4.  Patient will report pain at worst rated as </= 5/10 to reduce current functional limitations.  Baseline: see subjective  Goal status: INITIAL   5.  Patient will be independent with advanced home program to progress/maintain current level of function.  Baseline: initial HEP issued  07/06/2022: progressing Goal status: ONGOING     PLAN: PT FREQUENCY: 1-2x/week   PT DURATION: 8 weeks   PLANNED INTERVENTIONS: Therapeutic exercises, Therapeutic activity, Neuromuscular re-education, Balance training, Gait training, Patient/Family education, Self Care, Joint mobilization, Aquatic Therapy, Dry Needling, Cryotherapy, Moist heat, Manual therapy, and Re-evaluation   PLAN FOR NEXT SESSION: review and progress HEP prn; trunk and shoulder mobility, hip stretching, core/hip/shoulder strengthening as tolerated.    Rosana Hoes, PT, DPT, LAT, ATC 07/14/22  4:58 PM Phone: [phone] Fax: [phone]

## 2022-07-18 ENCOUNTER — Other Ambulatory Visit: Payer: Self-pay

## 2022-07-18 ENCOUNTER — Encounter: Payer: Self-pay | Admitting: Physical Therapy

## 2022-07-18 ENCOUNTER — Ambulatory Visit: Payer: 59 | Admitting: Physical Therapy

## 2022-07-18 DIAGNOSIS — M6281 Muscle weakness (generalized): Secondary | ICD-10-CM

## 2022-07-18 DIAGNOSIS — M542 Cervicalgia: Secondary | ICD-10-CM | POA: Diagnosis not present

## 2022-07-18 DIAGNOSIS — M5459 Other low back pain: Secondary | ICD-10-CM | POA: Diagnosis not present

## 2022-07-18 DIAGNOSIS — M25511 Pain in right shoulder: Secondary | ICD-10-CM | POA: Diagnosis not present

## 2022-07-18 NOTE — Therapy (Signed)
OUTPATIENT PHYSICAL THERAPY TREATMENT NOTE   Patient Name: Melissa Chavez MRN: [id number] DOB:[birth year], 62 y.o., female Today's Date: 07/18/2022  PCP: Georgianne Fick, MD REFERRING PROVIDER: Kathryne Hitch, MD    END OF SESSION:   PT End of Session - 07/18/22 1617     Visit Number 9    Number of Visits 17    Date for PT Re-Evaluation 07/23/22    Authorization Type MCR/MCD    Progress Note Due on Visit 10    PT Start Time 1615    PT Stop Time 1655    PT Time Calculation (min) 40 min    Activity Tolerance Patient tolerated treatment well    Behavior During Therapy WFL for tasks assessed/performed                    Past Medical History:  Diagnosis Date   Allergy    Anemia    Asthma    Bipolar 2 disorder (HCC)    Diabetes mellitus without complication (HCC)    GERD (gastroesophageal reflux disease)    Glaucoma    Pt denies   Hernia    Hypertension    Past Surgical History:  Procedure Laterality Date   deviated septum surgery x 2      HERNIA REPAIR     TONSILLECTOMY     Pt denies   TUBAL LIGATION     Patient Active Problem List   Diagnosis Date Noted   Primary osteoarthritis involving multiple joints 02/07/2022   Chronic kidney disease 01/18/2021   Chronic kidney disease, stage 3a (HCC) 01/18/2021   DDD (degenerative disc disease), cervical 01/18/2021   Diabetic renal disease (HCC) 01/18/2021   Idiopathic gout 01/18/2021   Long term (current) use of insulin (HCC) 01/18/2021   Hyperlipidemia, unspecified 01/18/2021   Morbid obesity (HCC) 01/18/2021   Unilateral primary osteoarthritis, left knee 09/13/2016   Unilateral primary osteoarthritis, right knee 09/13/2016   Pressure injury of skin 03/08/2016   Acute gout of left knee 03/07/2016   Sinus congestion 11/29/2015   Bilateral leg edema 09/25/2015   Abdominal pain, left upper quadrant 09/25/2015   DJD (degenerative joint disease) of upper arm 09/07/2015   Prednisone adverse  reaction 08/20/2015   Abdominal pain, chronic, epigastric 06/30/2015   Osteoarthritis of right foot 03/22/2015   Type 2 diabetes mellitus with diabetic polyneuropathy, without long-term current use of insulin (HCC) 03/20/2015   BMI 40.0-44.9, adult (HCC) 06/13/2013   Overactive bladder 06/13/2013   Allergic rhinitis 06/13/2013   Asthma, moderate persistent 06/13/2013   BIPOLAR DISORDER UNSPECIFIED 03/18/2008   PERIPHERAL EDEMA 07/03/2007   GERD 01/30/2007   HYPERCHOLESTEROLEMIA 12/07/2006   Essential hypertension 12/07/2006   Osteoarthritis of right knee 12/07/2006   ANEMIA, IRON DEFICIENCY 01/06/2006    REFERRING DIAG: Whiplash injury to neck, subsequent encounter   THERAPY DIAG:  Cervicalgia  Acute pain of right shoulder  Other low back pain  Muscle weakness (generalized)  Rationale for Evaluation and Treatment Rehabilitation  PERTINENT HISTORY: See PMH above  PRECAUTIONS: None   SUBJECTIVE:  SUBJECTIVE STATEMENT:  Patient reports she is doing well. She is feeling good. She is consistent with her exercises.  PAIN:  Are you having pain? Yes:  NPRS scale: 0 /10 Pain location: Rt shoulder  Pain description: tight;sore Aggravating factors: reaching/lifting overhead, prolonged sitting Relieving factors: heat   OBJECTIVE: (objective measures completed at initial evaluation unless otherwise dated) PATIENT SURVEYS:  FOTO 46% function to 56% predicted   07/06/2022: 60%   POSTURE:  Rounded shoulders and forward head   PALPATION: Diffuse tenderness about Rt shoulder and thoracolumbar paraspinals            CERVICAL ROM:    Active ROM A/PROM (deg) eval   06/20/2022   07/06/2022  Flexion WNL    Extension WNL    Right lateral flexion WNL    Left lateral flexion WNL    Right rotation 42*  55 60  Left rotation 64 65 65   (Blank rows = not tested) patient reports "tenderness" with all cervical AROM *concordant pain    UPPER EXTREMITY ROM:   Active ROM Right eval Left eval Rt / Lt 06/20/22  Shoulder flexion 140   170 / 170  Shoulder extension       Shoulder abduction WNL     Shoulder adduction       Shoulder extension       Shoulder internal rotation T10  T10    Shoulder external rotation C7 T2   Elbow flexion       Elbow extension       Wrist flexion       Wrist extension       Wrist ulnar deviation       Wrist radial deviation       Wrist pronation       Wrist supination        (Blank rows = not tested)   UPPER EXTREMITY MMT:   MMT Right eval Left eval 07/11/22  Shoulder flexion 4 4 5/5 bilaterally  Shoulder extension       Shoulder abduction 5 5   Shoulder adduction       Shoulder extension       Shoulder internal rotation 4+ 4+   Shoulder external rotation 4+ 4+   Middle trapezius       Lower trapezius       Elbow flexion       Elbow extension       Wrist flexion       Wrist extension       Wrist ulnar deviation       Wrist radial deviation       Wrist pronation       Wrist supination       Grip strength        (Blank rows = not tested)  LUMBAR ROM:    Active  A/PROM  eval  06/27/2022  Flexion WNL pn WFL  Extension WNL pn  WFL  Right lateral flexion 25% limited pn WFL  Left lateral flexion 25% limited pn  WFL  Right rotation WNL pn  WFL  Left rotation WNL pn WFL   (Blank rows = not tested)  LOWER EXTREMITY MMT:     MMT Right eval Left eval  Hip flexion 4+ 4+  Hip extension      Hip abduction 4- 4-  Hip adduction      Hip internal rotation      Hip external rotation      Knee flexion  Knee extension      Ankle dorsiflexion      Ankle plantarflexion      Ankle inversion      Ankle eversion       (Blank rows = not tested)  LOWER EXTREMITY ROM:      Active  Right eval Left eval  Hip flexion 95 mid back pain  95 mid  back pain   Hip extension      Hip abduction      Hip adduction      Hip internal rotation      Hip external rotation      Knee flexion      Knee extension      Ankle dorsiflexion      Ankle plantarflexion      Ankle inversion      Ankle eversion       (Blank rows = not tested)     TODAY'S TREATMENT: [facility] Adult PT Treatment:                                                DATE: 07/18/22 Therapeutic Exercise: NuStep L7 x 5 minutes UE/LE while taking subjective Bridge 2 x 10 SLR 2 x 10 each 90-90 abdominal set 2 x 10 Sidelying hip abduction 2 x 15 each Supine horizontal shoulder abduction yellow band x 20  Seated horizontal shoulder abduction yellow band 2 x 10  Sit to stand holding yellow med ball with overhead reach 2 x 10 Row with blue 2 x 20 Extension with yellow 2 x 20 Pallof press with blue 2 x 10 each   [facility] Adult PT Treatment:                                                DATE: 07/14/22 Therapeutic Exercise: NuStep L7 x 5 minutes UE/LE while taking subjective Row with blue x 20 Extension with yellow x 20 Supine horizontal shoulder abduction yellow band 2 x 20  Supine diagonals x 10 each Supine flexion with yellow 2 x 10 Seated double ER and scap retraction with yellow x 20 Sit to stand holding yellow med ball with overhead reach 2 x 10 Bridge 2 x 10 90-90 abdominal set 2 x 5 with 10 sec hold Pallof press with blue 2 x 10 each  [facility] Adult PT Treatment:                                                DATE: 07/11/22 Therapeutic Exercise: NuStep level 6 x 5 minutes UE/LE  Bilateral shoulder ER red band 2 x 10  Supine horizontal shoulder abduction red band 2 x 10  Supine single arm serratus punch 2 x 10;2# Standing shoulder flexion 2 x 10; 2#  Standing shoulder scaption 2 x 10;2# Sidelying shoulder abduction 2 x 10; 2# Sidelying ER 2 x 10;2# Rows blue band 2 x 10 Resisted shoulder extension red band 2 x 15  Hip bridge with abduction 2 x 10 blue band  Updated HEP    PATIENT EDUCATION:  Education details: HEP Person educated: Patient Education  method: Explanation, Demonstration, Tactile cues, Verbal cues Education comprehension: verbalized understanding, returned demonstration, verbal cues required, tactile cues required, and needs further education   HOME EXERCISE PROGRAM: Access Code: [email or web address]    ASSESSMENT: CLINICAL IMPRESSION: Patient tolerated therapy well with no adverse effects. Therapy continues to focus on progressing strengthening for shoulders, core, and LE with good tolerance. She does not report any pain with therapy but does require some cueing for proper posture with exercises. No changes to HEP this visit. She has made great progress in therapy and should be ready for discharge at next visit.   OBJECTIVE IMPAIRMENTS: decreased activity tolerance, decreased endurance, decreased knowledge of condition, difficulty walking, decreased ROM, decreased strength, increased fascial restrictions, impaired flexibility, impaired UE functional use, improper body mechanics, postural dysfunction, and pain.    ACTIVITY LIMITATIONS: carrying, lifting, bending, sitting, standing, squatting, reach over head, and locomotion level   PARTICIPATION LIMITATIONS: meal prep, cleaning, laundry, driving, shopping, and community activity   PERSONAL FACTORS: Age, Fitness, Time since onset of injury/illness/exacerbation, and 3+ comorbidities: see PMH above  are also affecting patient's functional outcome.      GOALS: Goals reviewed with patient? Yes   SHORT TERM GOALS: Target date: 06/21/2022   Patient will be independent and compliant with initial HEP.  Baseline: issued at eval  06/20/2022: progressing 07/06/2022: independent with initial HEP Goal status: MET   2.  Patient will demonstrate at least 60 degrees of Rt cervical rotation AROM to improve ability to complete head turns while driving.  Baseline: see above  06/20/2022: 55 deg 07/06/2022: 60 deg Goal  status: met   3.  Patient will demonstrate at least 160 degrees of Rt shoulder flexion AROM to improve ability to reach into overhead cabinets.  Baseline: see above  06/20/2022: 170 deg Goal status: MET  LONG TERM GOALS: Target date: 07/23/2022  Patient will score at least 56% on FOTO to signify clinically meaningful improvement in functional abilities.  Baseline: see above 07/06/2022: 60% Goal status: MET   2.  Patient will be able to lift and lower at least 3# into overhead cabinet with minimal pain.  Baseline: see subjective  Goal status: INITIAL   3.  Patient will demonstrate 4+/5 bilateral hip abductor strength to improve stability about the chain with prolonged walking/standing activity.  Baseline: see above  Goal status: INITIAL   4.  Patient will report pain at worst rated as </= 5/10 to reduce current functional limitations.  Baseline: see subjective  Goal status: INITIAL   5.  Patient will be independent with advanced home program to progress/maintain current level of function.  Baseline: initial HEP issued  07/06/2022: progressing Goal status: ONGOING     PLAN: PT FREQUENCY: 1-2x/week   PT DURATION: 8 weeks   PLANNED INTERVENTIONS: Therapeutic exercises, Therapeutic activity, Neuromuscular re-education, Balance training, Gait training, Patient/Family education, Self Care, Joint mobilization, Aquatic Therapy, Dry Needling, Cryotherapy, Moist heat, Manual therapy, and Re-evaluation   PLAN FOR NEXT SESSION: review and progress HEP prn; trunk and shoulder mobility, hip stretching, core/hip/shoulder strengthening as tolerated.    Rosana Hoes, PT, DPT, LAT, ATC 07/18/22  4:55 PM Phone: [phone] Fax: [phone]

## 2022-07-21 ENCOUNTER — Ambulatory Visit: Payer: 59

## 2022-07-21 DIAGNOSIS — M5459 Other low back pain: Secondary | ICD-10-CM

## 2022-07-21 DIAGNOSIS — M6281 Muscle weakness (generalized): Secondary | ICD-10-CM

## 2022-07-21 DIAGNOSIS — M25511 Pain in right shoulder: Secondary | ICD-10-CM

## 2022-07-21 DIAGNOSIS — M542 Cervicalgia: Secondary | ICD-10-CM | POA: Diagnosis not present

## 2022-07-21 NOTE — Therapy (Signed)
OUTPATIENT PHYSICAL THERAPY TREATMENT NOTE Progress Note Reporting Period 05/24/22 to 07/21/22  See note below for Objective Data and Assessment of Progress/Goals.     PHYSICAL THERAPY DISCHARGE SUMMARY  Visits from Start of Care: 10  Current functional level related to goals / functional outcomes: See goals below   Remaining deficits: N/A   Education / Equipment: See education below    Patient agrees to discharge. Patient goals were met. Patient is being discharged due to meeting the stated rehab goals.  Patient Name: Melissa Chavez MRN: [id number] DOB:01/24/1961, 62 y.o., female Today's Date: 07/21/2022  PCP: Georgianne Fick, MD REFERRING PROVIDER: Kathryne Hitch, MD    END OF SESSION:   PT End of Session - 07/21/22 1615     Visit Number 10    Number of Visits 17    Date for PT Re-Evaluation 07/23/22    Authorization Type MCR/MCD    Progress Note Due on Visit 10    PT Start Time 1615    PT Stop Time 1653    PT Time Calculation (min) 38 min    Activity Tolerance Patient tolerated treatment well    Behavior During Therapy WFL for tasks assessed/performed                     Past Medical History:  Diagnosis Date   Allergy    Anemia    Asthma    Bipolar 2 disorder (HCC)    Diabetes mellitus without complication (HCC)    GERD (gastroesophageal reflux disease)    Glaucoma    Pt denies   Hernia    Hypertension    Past Surgical History:  Procedure Laterality Date   deviated septum surgery x 2      HERNIA REPAIR     TONSILLECTOMY     Pt denies   TUBAL LIGATION     Patient Active Problem List   Diagnosis Date Noted   Primary osteoarthritis involving multiple joints 02/07/2022   Chronic kidney disease 01/18/2021   Chronic kidney disease, stage 3a (HCC) 01/18/2021   DDD (degenerative disc disease), cervical 01/18/2021   Diabetic renal disease (HCC) 01/18/2021   Idiopathic gout 01/18/2021   Long term (current) use of insulin  (HCC) 01/18/2021   Hyperlipidemia, unspecified 01/18/2021   Morbid obesity (HCC) 01/18/2021   Unilateral primary osteoarthritis, left knee 09/13/2016   Unilateral primary osteoarthritis, right knee 09/13/2016   Pressure injury of skin 03/08/2016   Acute gout of left knee 03/07/2016   Sinus congestion 11/29/2015   Bilateral leg edema 09/25/2015   Abdominal pain, left upper quadrant 09/25/2015   DJD (degenerative joint disease) of upper arm 09/07/2015   Prednisone adverse reaction 08/20/2015   Abdominal pain, chronic, epigastric 06/30/2015   Osteoarthritis of right foot 03/22/2015   Type 2 diabetes mellitus with diabetic polyneuropathy, without long-term current use of insulin (HCC) 03/20/2015   BMI 40.0-44.9, adult (HCC) 06/13/2013   Overactive bladder 06/13/2013   Allergic rhinitis 06/13/2013   Asthma, moderate persistent 06/13/2013   BIPOLAR DISORDER UNSPECIFIED 03/18/2008   PERIPHERAL EDEMA 07/03/2007   GERD 01/30/2007   HYPERCHOLESTEROLEMIA 12/07/2006   Essential hypertension 12/07/2006   Osteoarthritis of right knee 12/07/2006   ANEMIA, IRON DEFICIENCY 01/06/2006    REFERRING DIAG: Whiplash injury to neck, subsequent encounter   THERAPY DIAG:  Cervicalgia  Acute pain of right shoulder  Other low back pain  Muscle weakness (generalized)  Rationale for Evaluation and Treatment Rehabilitation  PERTINENT HISTORY: See PMH above  PRECAUTIONS: None   SUBJECTIVE:                                                                                                                                                                                     SUBJECTIVE STATEMENT:  "I'm doing good. Not really any pain right now."  PAIN:  Are you having pain? No    OBJECTIVE: (objective measures completed at initial evaluation unless otherwise dated) PATIENT SURVEYS:  FOTO 46% function to 56% predicted   07/06/2022: 60%   POSTURE:  Rounded shoulders and forward head    PALPATION: Diffuse tenderness about Rt shoulder and thoracolumbar paraspinals            CERVICAL ROM:    Active ROM A/PROM (deg) eval   06/20/2022   07/06/2022  Flexion WNL    Extension WNL    Right lateral flexion WNL    Left lateral flexion WNL    Right rotation 42* 55 60  Left rotation 64 65 65   (Blank rows = not tested) patient reports "tenderness" with all cervical AROM *concordant pain    UPPER EXTREMITY ROM:   Active ROM Right eval Left eval Rt / Lt 06/20/22  Shoulder flexion 140   170 / 170  Shoulder extension       Shoulder abduction WNL     Shoulder adduction       Shoulder extension       Shoulder internal rotation T10  T10    Shoulder external rotation C7 T2   Elbow flexion       Elbow extension       Wrist flexion       Wrist extension       Wrist ulnar deviation       Wrist radial deviation       Wrist pronation       Wrist supination        (Blank rows = not tested)   UPPER EXTREMITY MMT:   MMT Right eval Left eval 07/11/22  Shoulder flexion 4 4 5/5 bilaterally  Shoulder extension       Shoulder abduction 5 5   Shoulder adduction       Shoulder extension       Shoulder internal rotation 4+ 4+   Shoulder external rotation 4+ 4+   Middle trapezius       Lower trapezius       Elbow flexion       Elbow extension       Wrist flexion       Wrist extension       Wrist ulnar deviation  Wrist radial deviation       Wrist pronation       Wrist supination       Grip strength        (Blank rows = not tested)  LUMBAR ROM:    Active  A/PROM  eval  06/27/2022  Flexion WNL pn WFL  Extension WNL pn  WFL  Right lateral flexion 25% limited pn WFL  Left lateral flexion 25% limited pn  WFL  Right rotation WNL pn  WFL  Left rotation WNL pn WFL   (Blank rows = not tested)  LOWER EXTREMITY MMT:     MMT Right eval Left eval 07/21/22  Hip flexion 4+ 4+   Hip extension       Hip abduction 4- 4- 5/5 bilateral   Hip adduction       Hip  internal rotation       Hip external rotation       Knee flexion       Knee extension       Ankle dorsiflexion       Ankle plantarflexion       Ankle inversion       Ankle eversion        (Blank rows = not tested)  LOWER EXTREMITY ROM:      Active  Right eval Left eval 07/21/22  Hip flexion 95 mid back pain  95 mid back pain  WNL bilaterally and pain free   Hip extension       Hip abduction       Hip adduction       Hip internal rotation       Hip external rotation       Knee flexion       Knee extension       Ankle dorsiflexion       Ankle plantarflexion       Ankle inversion       Ankle eversion        (Blank rows = not tested)     TODAY'S TREATMENT: [facility] Adult PT Treatment:                                                DATE: 07/21/22 Therapeutic Exercise: NuStep level 6 x 5 minutes UE/LE Reviewed HEP performing all of the following exercises: Exercises - Supine Lower Trunk Rotation  - 1 x daily - 2 sets - 10 reps - Hooklying Single Knee to Chest Stretch  - 1 x daily - 3 reps - 20 hold - Supine Figure 4 Piriformis Stretch  - 1 x daily - 3 sets - 20  hold - Supine Piriformis Stretch with Foot on Ground  - 1 x daily - 3 reps - 20 hold - Seated Cervical Sidebending Stretch  - 1 x daily - 3 reps - 20 hold - Active Straight Leg Raise with Quad Set  - 3 x weekly - 2 sets - 10 reps - Sidelying Hip Abduction  - 3 x weekly - 2 sets - 10 reps - Standing Row with Anchored Resistance  - 3 x weekly - 2 sets - 20 reps - Sit to Stand Without Arm Support  - 3 x weekly - 3 sets - 10 reps - Bridge with Hip Abduction and Resistance  - 1 x daily - 3 x weekly -  2 sets - 10 reps - Shoulder External Rotation and Scapular Retraction with Resistance  - 1 x daily - 3 x weekly - 2 sets - 10 reps - Supine Shoulder Horizontal Abduction with Resistance  - 1 x daily - 3 x weekly - 2 sets - 10 reps  Therapeutic Activity: Re-assessment to determine overall progress, educating patient on progress  towards goals.     [facility] Adult PT Treatment:                                                DATE: 07/18/22 Therapeutic Exercise: NuStep L7 x 5 minutes UE/LE while taking subjective Bridge 2 x 10 SLR 2 x 10 each 90-90 abdominal set 2 x 10 Sidelying hip abduction 2 x 15 each Supine horizontal shoulder abduction yellow band x 20  Seated horizontal shoulder abduction yellow band 2 x 10  Sit to stand holding yellow med ball with overhead reach 2 x 10 Row with blue 2 x 20 Extension with yellow 2 x 20 Pallof press with blue 2 x 10 each   [facility] Adult PT Treatment:                                                DATE: 07/14/22 Therapeutic Exercise: NuStep L7 x 5 minutes UE/LE while taking subjective Row with blue x 20 Extension with yellow x 20 Supine horizontal shoulder abduction yellow band 2 x 20  Supine diagonals x 10 each Supine flexion with yellow 2 x 10 Seated double ER and scap retraction with yellow x 20 Sit to stand holding yellow med ball with overhead reach 2 x 10 Bridge 2 x 10 90-90 abdominal set 2 x 5 with 10 sec hold Pallof press with blue 2 x 10 each   PATIENT EDUCATION:  Education details: HEP Person educated: Patient Education method: Programmer, multimedia, Demonstration, Actor cues, Verbal cues, handout  Education comprehension: verbalized understanding, returned demonstration   HOME EXERCISE PROGRAM: Access Code: [email or web address]    ASSESSMENT: CLINICAL IMPRESSION: Rayvon has progressed well in PT reporting an overall improvement in her Rt shoulder, neck, and back pain since the start of care. She has met all established functional goals and reports no difficulties with daily activities. She demonstrates independence with advanced home program. She is therefore appropriate for discharge with patient in agreement with this plan.    OBJECTIVE IMPAIRMENTS: decreased activity tolerance, decreased endurance, decreased knowledge of condition, difficulty walking, decreased ROM,  decreased strength, increased fascial restrictions, impaired flexibility, impaired UE functional use, improper body mechanics, postural dysfunction, and pain.    ACTIVITY LIMITATIONS: carrying, lifting, bending, sitting, standing, squatting, reach over head, and locomotion level   PARTICIPATION LIMITATIONS: meal prep, cleaning, laundry, driving, shopping, and community activity   PERSONAL FACTORS: Age, Fitness, Time since onset of injury/illness/exacerbation, and 3+ comorbidities: see PMH above  are also affecting patient's functional outcome.      GOALS: Goals reviewed with patient? Yes   SHORT TERM GOALS: Target date: 06/21/2022   Patient will be independent and compliant with initial HEP.  Baseline: issued at eval  06/20/2022: progressing 07/06/2022: independent with initial HEP Goal status: MET   2.  Patient will demonstrate at least 60 degrees of Rt cervical  rotation AROM to improve ability to complete head turns while driving.  Baseline: see above  06/20/2022: 55 deg 07/06/2022: 60 deg Goal status: met   3.  Patient will demonstrate at least 160 degrees of Rt shoulder flexion AROM to improve ability to reach into overhead cabinets.  Baseline: see above  06/20/2022: 170 deg Goal status: MET  LONG TERM GOALS: Target date: 07/23/2022  Patient will score at least 56% on FOTO to signify clinically meaningful improvement in functional abilities.  Baseline: see above 07/06/2022: 60% Goal status: MET   2.  Patient will be able to lift and lower at least 3# into overhead cabinet with minimal pain.  Baseline: see subjective  07/21/22: able to lift 3# into shelf without pain  Goal status: met   3.  Patient will demonstrate 4+/5 bilateral hip abductor strength to improve stability about the chain with prolonged walking/standing activity.  Baseline: see above  Goal status: met   4.  Patient will report pain at worst rated as </= 5/10 to reduce current functional limitations.  Baseline:  see subjective  Goal status: met   5.  Patient will be independent with advanced home program to progress/maintain current level of function.  Baseline: initial HEP issued  07/06/2022: progressing Goal status: met     PLAN: PT FREQUENCY: n/a   PT DURATION:n/a   PLANNED INTERVENTIONS: Therapeutic exercises, Therapeutic activity, Neuromuscular re-education, Balance training, Gait training, Patient/Family education, Self Care, Joint mobilization, Aquatic Therapy, Dry Needling, Cryotherapy, Moist heat, Manual therapy, and Re-evaluation   PLAN FOR NEXT SESSION: n/a  Letitia Libra, PT, DPT, ATC 07/21/22 4:54 PM

## 2022-07-25 ENCOUNTER — Encounter: Payer: 59 | Admitting: Physical Therapy

## 2022-07-28 ENCOUNTER — Encounter: Payer: 59 | Admitting: Physical Therapy

## 2022-08-04 ENCOUNTER — Encounter: Payer: 59 | Admitting: Physical Therapy

## 2022-08-09 ENCOUNTER — Ambulatory Visit: Payer: Medicare Other | Admitting: Podiatry

## 2022-08-22 ENCOUNTER — Ambulatory Visit: Payer: Medicare Other | Admitting: Podiatry

## 2023-01-02 DIAGNOSIS — E782 Mixed hyperlipidemia: Secondary | ICD-10-CM | POA: Diagnosis not present

## 2023-01-02 DIAGNOSIS — I10 Essential (primary) hypertension: Secondary | ICD-10-CM | POA: Diagnosis not present

## 2023-01-02 DIAGNOSIS — E1122 Type 2 diabetes mellitus with diabetic chronic kidney disease: Secondary | ICD-10-CM | POA: Diagnosis not present

## 2023-01-02 DIAGNOSIS — N1831 Chronic kidney disease, stage 3a: Secondary | ICD-10-CM | POA: Diagnosis not present

## 2023-01-02 DIAGNOSIS — M1A09X0 Idiopathic chronic gout, multiple sites, without tophus (tophi): Secondary | ICD-10-CM | POA: Diagnosis not present

## 2023-01-12 DIAGNOSIS — E782 Mixed hyperlipidemia: Secondary | ICD-10-CM | POA: Diagnosis not present

## 2023-01-12 DIAGNOSIS — Z23 Encounter for immunization: Secondary | ICD-10-CM | POA: Diagnosis not present

## 2023-01-12 DIAGNOSIS — N3281 Overactive bladder: Secondary | ICD-10-CM | POA: Diagnosis not present

## 2023-01-12 DIAGNOSIS — J302 Other seasonal allergic rhinitis: Secondary | ICD-10-CM | POA: Diagnosis not present

## 2023-01-12 DIAGNOSIS — K219 Gastro-esophageal reflux disease without esophagitis: Secondary | ICD-10-CM | POA: Diagnosis not present

## 2023-01-12 DIAGNOSIS — I10 Essential (primary) hypertension: Secondary | ICD-10-CM | POA: Diagnosis not present

## 2023-01-12 DIAGNOSIS — Z0000 Encounter for general adult medical examination without abnormal findings: Secondary | ICD-10-CM | POA: Diagnosis not present

## 2023-01-12 DIAGNOSIS — N1831 Chronic kidney disease, stage 3a: Secondary | ICD-10-CM | POA: Diagnosis not present

## 2023-01-12 DIAGNOSIS — J4520 Mild intermittent asthma, uncomplicated: Secondary | ICD-10-CM | POA: Diagnosis not present

## 2023-01-12 DIAGNOSIS — E1122 Type 2 diabetes mellitus with diabetic chronic kidney disease: Secondary | ICD-10-CM | POA: Diagnosis not present

## 2023-01-30 DIAGNOSIS — E782 Mixed hyperlipidemia: Secondary | ICD-10-CM | POA: Diagnosis not present

## 2023-01-30 DIAGNOSIS — E1122 Type 2 diabetes mellitus with diabetic chronic kidney disease: Secondary | ICD-10-CM | POA: Diagnosis not present

## 2023-01-30 DIAGNOSIS — I10 Essential (primary) hypertension: Secondary | ICD-10-CM | POA: Diagnosis not present

## 2023-01-30 DIAGNOSIS — N1831 Chronic kidney disease, stage 3a: Secondary | ICD-10-CM | POA: Diagnosis not present

## 2023-01-31 ENCOUNTER — Telehealth: Payer: Self-pay | Admitting: Allergy & Immunology

## 2023-01-31 NOTE — Telephone Encounter (Signed)
Called patient to schedule [name] reapproval appointment. Patient states she has not used [facility] in a while and is doing just fine. She does not want to continue therapy.

## 2023-01-31 NOTE — Telephone Encounter (Signed)
noted 

## 2023-05-12 DIAGNOSIS — E1122 Type 2 diabetes mellitus with diabetic chronic kidney disease: Secondary | ICD-10-CM | POA: Diagnosis not present

## 2023-05-16 DIAGNOSIS — I10 Essential (primary) hypertension: Secondary | ICD-10-CM | POA: Diagnosis not present

## 2023-05-16 DIAGNOSIS — E1122 Type 2 diabetes mellitus with diabetic chronic kidney disease: Secondary | ICD-10-CM | POA: Diagnosis not present

## 2023-05-16 DIAGNOSIS — N1831 Chronic kidney disease, stage 3a: Secondary | ICD-10-CM | POA: Diagnosis not present

## 2023-05-16 DIAGNOSIS — E782 Mixed hyperlipidemia: Secondary | ICD-10-CM | POA: Diagnosis not present

## 2023-11-16 DIAGNOSIS — E1122 Type 2 diabetes mellitus with diabetic chronic kidney disease: Secondary | ICD-10-CM | POA: Diagnosis not present

## 2024-03-22 ENCOUNTER — Other Ambulatory Visit: Payer: Self-pay | Admitting: Internal Medicine

## 2024-03-22 ENCOUNTER — Other Ambulatory Visit: Payer: Self-pay

## 2024-03-22 DIAGNOSIS — Z1231 Encounter for screening mammogram for malignant neoplasm of breast: Secondary | ICD-10-CM

## 2024-05-13 ENCOUNTER — Ambulatory Visit
